# Patient Record
Sex: Male | Born: 1935 | Race: White | Hispanic: No | Marital: Married | Smoking: Former smoker
Health system: Southern US, Community
[De-identification: ages and names within clinical notes are randomized; demographics above are authoritative.]

## PROBLEM LIST (undated history)

## (undated) DIAGNOSIS — I639 Cerebral infarction, unspecified: Secondary | ICD-10-CM

## (undated) DIAGNOSIS — Z79899 Other long term (current) drug therapy: Secondary | ICD-10-CM

## (undated) DIAGNOSIS — IMO0001 Reserved for inherently not codable concepts without codable children: Secondary | ICD-10-CM

## (undated) DIAGNOSIS — E785 Hyperlipidemia, unspecified: Secondary | ICD-10-CM

## (undated) DIAGNOSIS — N189 Chronic kidney disease, unspecified: Secondary | ICD-10-CM

## (undated) DIAGNOSIS — E669 Obesity, unspecified: Secondary | ICD-10-CM

## (undated) DIAGNOSIS — I498 Other specified cardiac arrhythmias: Secondary | ICD-10-CM

## (undated) DIAGNOSIS — I2510 Atherosclerotic heart disease of native coronary artery without angina pectoris: Secondary | ICD-10-CM

## (undated) DIAGNOSIS — Z950 Presence of cardiac pacemaker: Secondary | ICD-10-CM

## (undated) DIAGNOSIS — I480 Paroxysmal atrial fibrillation: Secondary | ICD-10-CM

## (undated) DIAGNOSIS — K219 Gastro-esophageal reflux disease without esophagitis: Secondary | ICD-10-CM

## (undated) DIAGNOSIS — I10 Essential (primary) hypertension: Secondary | ICD-10-CM

## (undated) DIAGNOSIS — E1165 Type 2 diabetes mellitus with hyperglycemia: Secondary | ICD-10-CM

## (undated) HISTORY — DX: Presence of cardiac pacemaker: Z95.0

## (undated) HISTORY — DX: Type 2 diabetes mellitus with hyperglycemia: E11.65

## (undated) HISTORY — DX: Atherosclerotic heart disease of native coronary artery without angina pectoris: I25.10

## (undated) HISTORY — DX: Other specified cardiac arrhythmias: I49.8

## (undated) HISTORY — DX: Hyperlipidemia, unspecified: E78.5

## (undated) HISTORY — DX: Cerebral infarction, unspecified: I63.9

## (undated) HISTORY — DX: Gastro-esophageal reflux disease without esophagitis: K21.9

## (undated) HISTORY — DX: Essential (primary) hypertension: I10

## (undated) HISTORY — DX: Other long term (current) drug therapy: Z79.899

## (undated) HISTORY — DX: Chronic kidney disease, unspecified: N18.9

## (undated) HISTORY — DX: Obesity, unspecified: E66.9

## (undated) HISTORY — DX: Paroxysmal atrial fibrillation: I48.0

## (undated) HISTORY — DX: Reserved for inherently not codable concepts without codable children: IMO0001

---

## 1999-09-24 ENCOUNTER — Ambulatory Visit (HOSPITAL_COMMUNITY): Admission: RE | Admit: 1999-09-24 | Discharge: 1999-09-24 | Payer: Self-pay | Admitting: Gastroenterology

## 1999-09-24 ENCOUNTER — Encounter (INDEPENDENT_AMBULATORY_CARE_PROVIDER_SITE_OTHER): Payer: Self-pay

## 2002-06-30 ENCOUNTER — Inpatient Hospital Stay (HOSPITAL_COMMUNITY): Admission: EM | Admit: 2002-06-30 | Discharge: 2002-07-01 | Payer: Self-pay

## 2002-07-01 ENCOUNTER — Encounter: Payer: Self-pay | Admitting: Cardiology

## 2003-06-10 ENCOUNTER — Encounter (INDEPENDENT_AMBULATORY_CARE_PROVIDER_SITE_OTHER): Payer: Self-pay

## 2003-06-10 ENCOUNTER — Ambulatory Visit (HOSPITAL_COMMUNITY): Admission: RE | Admit: 2003-06-10 | Discharge: 2003-06-10 | Payer: Self-pay | Admitting: Gastroenterology

## 2003-10-28 ENCOUNTER — Inpatient Hospital Stay (HOSPITAL_COMMUNITY): Admission: EM | Admit: 2003-10-28 | Discharge: 2003-10-31 | Payer: Self-pay | Admitting: Emergency Medicine

## 2003-11-25 ENCOUNTER — Encounter: Admission: RE | Admit: 2003-11-25 | Discharge: 2004-02-23 | Payer: Self-pay | Admitting: *Deleted

## 2008-10-16 ENCOUNTER — Inpatient Hospital Stay (HOSPITAL_COMMUNITY): Admission: EM | Admit: 2008-10-16 | Discharge: 2008-10-24 | Payer: Self-pay | Admitting: Cardiology

## 2008-10-17 ENCOUNTER — Encounter: Payer: Self-pay | Admitting: Cardiology

## 2009-05-04 ENCOUNTER — Inpatient Hospital Stay (HOSPITAL_COMMUNITY): Admission: AD | Admit: 2009-05-04 | Discharge: 2009-05-07 | Payer: Self-pay | Admitting: Cardiology

## 2009-06-17 ENCOUNTER — Encounter: Admission: RE | Admit: 2009-06-17 | Discharge: 2009-06-17 | Payer: Self-pay | Admitting: Nephrology

## 2010-01-03 HISTORY — PX: PACEMAKER INSERTION: SHX728

## 2010-01-13 ENCOUNTER — Ambulatory Visit: Payer: Self-pay | Admitting: Vascular Surgery

## 2010-02-02 ENCOUNTER — Observation Stay (HOSPITAL_COMMUNITY): Admission: RE | Admit: 2010-02-02 | Discharge: 2010-02-03 | Payer: Self-pay | Admitting: Cardiology

## 2010-03-22 ENCOUNTER — Encounter: Payer: Self-pay | Admitting: Internal Medicine

## 2010-04-12 ENCOUNTER — Ambulatory Visit: Payer: Self-pay | Admitting: Cardiology

## 2010-04-14 ENCOUNTER — Ambulatory Visit: Payer: Self-pay | Admitting: Cardiology

## 2010-04-20 ENCOUNTER — Ambulatory Visit (HOSPITAL_COMMUNITY): Admission: RE | Admit: 2010-04-20 | Discharge: 2010-04-20 | Payer: Self-pay | Admitting: Cardiology

## 2010-04-20 ENCOUNTER — Ambulatory Visit: Payer: Self-pay | Admitting: Cardiology

## 2010-04-27 ENCOUNTER — Ambulatory Visit: Payer: Self-pay | Admitting: Cardiology

## 2010-05-18 ENCOUNTER — Ambulatory Visit: Payer: Self-pay | Admitting: Cardiology

## 2010-07-03 ENCOUNTER — Encounter: Payer: Self-pay | Admitting: Internal Medicine

## 2010-07-19 ENCOUNTER — Ambulatory Visit: Payer: Self-pay | Admitting: Cardiology

## 2010-08-04 ENCOUNTER — Ambulatory Visit: Payer: Self-pay | Admitting: Internal Medicine

## 2010-08-04 DIAGNOSIS — I498 Other specified cardiac arrhythmias: Secondary | ICD-10-CM

## 2010-08-04 DIAGNOSIS — I2510 Atherosclerotic heart disease of native coronary artery without angina pectoris: Secondary | ICD-10-CM | POA: Insufficient documentation

## 2010-08-04 DIAGNOSIS — I495 Sick sinus syndrome: Secondary | ICD-10-CM | POA: Insufficient documentation

## 2010-08-04 DIAGNOSIS — I4891 Unspecified atrial fibrillation: Secondary | ICD-10-CM | POA: Insufficient documentation

## 2010-08-04 DIAGNOSIS — I10 Essential (primary) hypertension: Secondary | ICD-10-CM | POA: Insufficient documentation

## 2010-08-04 HISTORY — DX: Other specified cardiac arrhythmias: I49.8

## 2010-09-26 ENCOUNTER — Encounter: Payer: Self-pay | Admitting: Urology

## 2010-10-05 NOTE — Cardiovascular Report (Signed)
Summary: Office Visit   Office Visit   Imported By: Roderic Ovens 08/10/2010 10:41:56  _____________________________________________________________________  External Attachment:    Type:   Image     Comment:   External Document

## 2010-10-05 NOTE — Miscellaneous (Signed)
Summary: Device preload  Clinical Lists Changes  Observations: Added new observation of PPM INDICATN: A-fib (07/03/2010 10:52) Added new observation of MAGNET RTE: BOL 85 ERI 65 (07/03/2010 10:52) Added new observation of PPMLEADSTAT2: active (07/03/2010 10:52) Added new observation of PPMLEADSER2: 161096  (07/03/2010 10:52) Added new observation of PPMLEADMOD2: 4470  (07/03/2010 10:52) Added new observation of PPMLEADDOI2: 02/02/2010  (07/03/2010 10:52) Added new observation of PPMLEADLOC2: RV  (07/03/2010 10:52) Added new observation of PPMLEADSTAT1: active  (07/03/2010 10:52) Added new observation of PPMLEADSER1: 045409  (07/03/2010 10:52) Added new observation of PPMLEADMOD1: 4469  (07/03/2010 10:52) Added new observation of PPMLEADDOI1: 02/02/2010  (07/03/2010 10:52) Added new observation of PPMLEADLOC1: RA  (07/03/2010 10:52) Added new observation of PPM DOI: 02/02/2010  (07/03/2010 10:52) Added new observation of PPM SERL#: [id number]  (07/03/2010 10:52) Added new observation of PPM MODL#: [id number]  (07/03/2010 10:52) Added new observation of PACEMAKERMFG: Medtronic  (07/03/2010 10:52) Added new observation of PPM IMP MD: Duffy Rhody Tennant,MD  (07/03/2010 10:52) Added new observation of PPM REFER MD: Rolla Plate  (07/03/2010 10:52) Added new observation of PACEMAKER MD: [name], MD  (07/03/2010 10:52)      PPM Specifications Following MD:  [name], MD     Referring MD:  Rolla Plate PPM Vendor:  Medtronic     PPM Model Number:  [id number]     PPM Serial Number:  [id number] PPM DOI:  02/02/2010     PPM Implanting MD:  Rolla Plate  Lead 1    Location: RA     DOI: 02/02/2010     Model #: [id number]     Serial #: [id number]     Status: active Lead 2    Location: RV     DOI: 02/02/2010     Model #: 4470     Serial #: [id number]     Status: active  Magnet Response Rate:  BOL 85 ERI 65  Indications:  A-fib

## 2010-10-05 NOTE — Letter (Signed)
Summary: [facility]  [facility]   Imported By: Marylou Mccoy 08/13/2010 17:30:14  _____________________________________________________________________  External Attachment:    Type:   Image     Comment:   External Document

## 2010-10-05 NOTE — Assessment & Plan Note (Signed)
Summary: pacer check/medtronic   Visit Type:  Initial Consult Referring Provider:  Dr Deborah Chalk   History of Present Illness: Mr Kent Nolan is a pleasant 77 yo WM with a h/o persistent atrial fibrillation with slow ventricular response and symptomatic second degree AV block s/p PPM implantation (MDT) by Dr Deborah Chalk 02/02/10.  He reports doing well since that time.  He has had no significant dizziness.  He feels that his energy is stable.  He underwent cardioversion 8/11 and has done well since that time.  The patient denies symptoms of palpitations, chest pain, shortness of breath, orthopnea, PND, lower extremity edema, dizziness, presyncope, syncope, or neurologic sequela. The patient is tolerating medications without difficulties and is otherwise without complaint today.   Current Medications (verified): 1)  Plavix 75 Mg Tabs (Clopidogrel Bisulfate) .... Take One Tablet By Mouth Daily 2)  Amiodarone Hcl 200 Mg Tabs (Amiodarone Hcl) .... Take One Tablet By Mouth Once Daily 3)  Furosemide 40 Mg Tabs (Furosemide) .... Take One Tablet By Mouth Daily. 4)  Crestor 20 Mg Tabs (Rosuvastatin Calcium) .... Take One Tablet By Mouth Daily. 5)  Tricor 145 Mg Tabs (Fenofibrate) .... Once Daily 6)  Famotidine 40 Mg Tabs (Famotidine) .... Once Daily 7)  Nitrostat 0.4 Mg Subl (Nitroglycerin) .Marland Kitchen.. 1 Tablet Under Tongue At Onset of Chest Pain; You May Repeat Every 5 Minutes For Up To 3 Doses. 8)  Multivitamins  Tabs (Multiple Vitamin) .... Once Daily  Allergies (verified): 1)  ! Penicillin 2)  ! Sulfa  Past History:  Past Medical History: Second degree AV block s/p PPM (MDT) Persistent atrial fibrillation with slow ventricular response HL CAD with previous subendocardial MI in  February 2005 with stent placement to the right coronary artery and       left circumflex.  He experienced acute thrombosis of both stents in February 2010 associated with cardiogenic shock.  He had repeat stenting to both lesions with  non-drug-eluting stents placed.   Followup catheterization in September 2010 was satisfactory.  He continues with medical management and is on chronic Plavix.  Diabetes.  Hypertension.  GERD.  Chronic renal insufficiency.   Past Surgical History: s/p PPM (MDT) by Dr Deborah Chalk  Family History: unaware of significant FH  Social History: Lives in [place], denies TED  Review of Systems       All systems are reviewed and negative except as listed in the HPI.   Vital Signs:  Patient profile:   77 year old male Height:      68 inches Weight:      219 pounds BMI:     33.42 Pulse rate:   60 / minute BP sitting:   136 / 83  (right arm)  Vitals Entered By: Laurance Flatten CMA (August 04, 2010 12:15 PM)  Physical Exam  General:  Well developed, well nourished, in no acute distress. Head:  normocephalic and atraumatic Eyes:  PERRLA/EOM intact; conjunctiva and lids normal. Mouth:  Teeth, gums and palate normal. Oral mucosa normal. Neck:  supple Chest Wall:  R sided pacemaker pocket is well healed Lungs:  Clear bilaterally to auscultation and percussion. Heart:  RRR, no m/r/g Abdomen:  Bowel sounds positive; abdomen soft and non-tender without masses, organomegaly, or hernias noted. No hepatosplenomegaly. Msk:  Back normal, normal gait. Muscle strength and tone normal. Pulses:  pulses normal in all 4 extremities Extremities:  No clubbing or cyanosis. Neurologic:  Alert and oriented x 3.   PPM Specifications Following MD:  [name], MD  Referring MD:  Rolla Plate PPM Vendor:  [employer]tronic     PPM Model Number:  [id number]     PPM Serial Number:  [id number] H PPM DOI:  02/02/2010     PPM Implanting MD:  Duffy Rhody Tennant,MD  Lead 1    Location: RA     DOI: 02/02/2010     Model #: 4469     Serial #: [id number]     Status: active Lead 2    Location: RV     DOI: 02/02/2010     Model #: 4470     Serial #: [id number]     Status: active  Magnet Response Rate:  BOL 85 ERI  65  Indications:  A-fib   PPM Follow Up Battery Voltage:  2.99 V       PPM Device Measurements Atrium  Amplitude: 2 mV, Impedance: 408 ohms,  Right Ventricle  Impedance: 360 ohms,   Episodes MS Episodes:  26     Percent Mode Switch:  0.7%     Ventricular High Rate:  0     Atrial Pacing:  78.8%     Ventricular Pacing:  64.3%  Parameters Mode:  DDD     Lower Rate Limit:  50     Upper Rate Limit:  130 Paced AV Delay:  300     Sensed AV Delay:  270 Tech Comments:  [facility]---26 AF EPISODES---LONGEST WAS 4 HRS.  0.7% OF TIME.  RA THRESHOLD 6.0 @ 1.75ms OR 8 @ 0.64ms.  SET AT 6.0 @ 1.80ms.  CHANGED LRL FROM 60 TO 50 TO DECREASE AMOUNT OF AP. PLAN PER JA.  PT TO BE ENROLLED IN [facility] AND FIRST TRANSMISSION 11-04-10. Vella Kohler  August 04, 2010 12:21 PM MD Comments:  agree  Impression & Recommendations:  Problem # 1:  BRADYCARDIA (ICD-427.89) Assessment Unchanged doing well s/p PPM the patient's RA lead threshold is elevated and does not consistently capture at max output.  I discussed lead revision with the patient today.  He is clear that he does not wish to have his lead revised at this time. I have therefore reduced his lower pacing rate to 50 bpm to minimize attempts to pace the atrium.  He will follow-up with Dr Maylon Cos for further discussion.  We may be able to avoid lead revision, depending on how he is doing on follow-up.  Problem # 2:  ATRIAL FIBRILLATION (ICD-427.31) maintaining sinus rhythm with amiodarone His CHADS2 score is at least three (age, HTN, and DM).  He should therefore be chronically anticoaguated with coumadin, pradaxa, or Xarelto.  He does not recall why he has been taken off of coumadin.  I have recommended that he follow-up with Dr Deborah Chalk to discuss this further.  Problem # 3:  ESSENTIAL HYPERTENSION, BENIGN (ICD-401.1) stable no changes  Problem # 4:  CAD (ICD-414.00) no ischemic symptoms he will require lifelong plavix  Patient Instructions: 1)   Your physician recommends that you schedule a follow-up appointment in: 3 months. 2)  Your physician recommends that you continue on your current medications as directed. Please refer to the Current Medication list given to you today. 3)  He should follow-up with Dr Deborah Chalk in the interim.

## 2010-11-08 ENCOUNTER — Encounter: Payer: Self-pay | Admitting: Internal Medicine

## 2010-11-08 ENCOUNTER — Encounter (INDEPENDENT_AMBULATORY_CARE_PROVIDER_SITE_OTHER): Payer: Medicare Other | Admitting: Internal Medicine

## 2010-11-08 DIAGNOSIS — I4891 Unspecified atrial fibrillation: Secondary | ICD-10-CM

## 2010-11-08 DIAGNOSIS — I495 Sick sinus syndrome: Secondary | ICD-10-CM

## 2010-11-08 DIAGNOSIS — I10 Essential (primary) hypertension: Secondary | ICD-10-CM

## 2010-11-16 NOTE — Assessment & Plan Note (Signed)
Summary: FOLLOW UP/D. MILLER/KL   Visit Type:  Follow-up Referring Provider:  Dr Deborah Chalk   History of Present Illness: Mr Nevares is a pleasant 77 yo WM with a h/o persistent atrial fibrillation with slow ventricular response and symptomatic second degree AV block s/p PPM implantation (MDT) by Dr Deborah Chalk 02/02/10.  He reports doing well since that time.  The patient denies symptoms of palpitations, chest pain, shortness of breath, orthopnea, PND, lower extremity edema, dizziness, presyncope, syncope, or neurologic sequela. The patient is tolerating medications without difficulties and is otherwise without complaint today.   Current Medications (verified): 1)  Plavix 75 Mg Tabs (Clopidogrel Bisulfate) .... Take One Tablet By Mouth Daily 2)  Amiodarone Hcl 200 Mg Tabs (Amiodarone Hcl) .... Take One Tablet By Mouth Once Daily 3)  Furosemide 40 Mg Tabs (Furosemide) .... Take One Tablet By Mouth Daily. 4)  Crestor 20 Mg Tabs (Rosuvastatin Calcium) .... Take One Tablet By Mouth Daily. 5)  Tricor 145 Mg Tabs (Fenofibrate) .... Once Daily 6)  Famotidine 40 Mg Tabs (Famotidine) .... Once Daily 7)  Nitrostat 0.4 Mg Subl (Nitroglycerin) .Marland Kitchen.. 1 Tablet Under Tongue At Onset of Chest Pain; You May Repeat Every 5 Minutes For Up To 3 Doses. 8)  Multivitamins  Tabs (Multiple Vitamin) .... Once Daily 9)  Aspirin 81 Mg Tbec (Aspirin) .... Take One Tablet By Mouth Daily 10)  Fish Oil   Oil (Fish Oil) .Marland Kitchen.. 1 Capsule Two Times A Day  Allergies: 1)  ! Penicillin 2)  ! Sulfa  Past History:  Past Medical History: Reviewed history from 08/04/2010 and no changes required. Second degree AV block s/p PPM (MDT) Persistent atrial fibrillation with slow ventricular response HL CAD with previous subendocardial MI in  February 2005 with stent placement to the right coronary artery and       left circumflex.  He experienced acute thrombosis of both stents in February 2010 associated with cardiogenic shock.  He had  repeat stenting to both lesions with non-drug-eluting stents placed.   Followup catheterization in September 2010 was satisfactory.  He continues with medical management and is on chronic Plavix.  Diabetes.  Hypertension.  GERD.  Chronic renal insufficiency.   Past Surgical History: Reviewed history from 08/04/2010 and no changes required. s/p PPM (MDT) by Dr Deborah Chalk  Social History: Reviewed history from 08/04/2010 and no changes required. Lives in [place], denies TED  Review of Systems       All systems are reviewed and negative except as listed in the HPI.   Vital Signs:  Patient profile:   77 year old male Height:      68 inches Weight:      215 pounds BMI:     32.81 Pulse rate:   72 / minute BP sitting:   120 / 80  (left arm)  Vitals Entered By: Laurance Flatten CMA (November 08, 2010 11:17 AM)  Physical Exam  General:  Well developed, well nourished, in no acute distress. Head:  normocephalic and atraumatic Eyes:  PERRLA/EOM intact; conjunctiva and lids normal. Mouth:  Teeth, gums and palate normal. Oral mucosa normal. Neck:  supple Chest Wall:  R sided pacemaker pocket is well healed Lungs:  Clear bilaterally to auscultation and percussion. Heart:  RRR, no m/r/g Abdomen:  Bowel sounds positive; abdomen soft and non-tender without masses, organomegaly, or hernias noted. No hepatosplenomegaly. Msk:  Back normal, normal gait. Muscle strength and tone normal. Extremities:  No clubbing or cyanosis. Neurologic:  Alert and oriented  x 3.   PPM Specifications Following MD:  [name], MD     Referring MD:  Rolla Plate PPM Vendor:  [employer]     PPM Model Number:  [id number]     PPM Serial Number:  [id number] PPM DOI:  02/02/2010     PPM Implanting MD:  Rolla Plate  Lead 1    Location: RA     DOI: 02/02/2010     Model #: 4469     Serial #: [id number]     Status: active Lead 2    Location: RV     DOI: 02/02/2010     Model #: 4470     Serial #: [id number]     Status:  active  Magnet Response Rate:  BOL 85 ERI 65  Indications:  A-fib   Parameters Mode:  DDD     Lower Rate Limit:  50     Upper Rate Limit:  130 Paced AV Delay:  300     Sensed AV Delay:  270 MD Comments:  see scanned report from [facility]  Impression & Recommendations:  Problem # 1:  BRADYCARDIA (ICD-427.89) doing well s/p PPM the patient's RA lead threshold remains elevated.  I discussed lead revision with the patient today.  He is clear that he does not wish to have his lead revised at this time. I previously reduced his lower pacing rate to 50 bpm to minimize attempts to pace the atrium.  Problem # 2:  ATRIAL FIBRILLATION (ICD-427.31)  maintaining sinus rhythm with amiodarone His CHADS2 score is at least three (age, HTN, and DM).  He should therefore be chronically anticoaguated with coumadin, pradaxa, or Xarelto.  He does not recall why he has been taken off of coumadin.  I have recommended that he follow-up with Dr Deborah Chalk to discuss this further.  Problem # 3:  ESSENTIAL HYPERTENSION, BENIGN (ICD-401.1)  His updated medication list for this problem includes:    Furosemide 40 Mg Tabs (Furosemide) .Marland Kitchen... Take one tablet by mouth daily.    Aspirin 81 Mg Tbec (Aspirin) .Marland Kitchen... Take one tablet by mouth daily  stable no changes  Problem # 4:  CAD (ICD-414.00) no ischemic symptoms he will require lifelong plavix  Patient Instructions: 1)  Your physician recommends that you continue on your current medications as directed. Please refer to the Current Medication list given to you today. 2)  Your physician wants you to follow-up in 6 months with with r llred:   You will receive a reminder letter in the mail two months in advance. If you don't receive a letter, please call our office to schedule the follow-up appointment.  3)  [facility] 02/10/2011

## 2010-11-17 ENCOUNTER — Ambulatory Visit (INDEPENDENT_AMBULATORY_CARE_PROVIDER_SITE_OTHER): Payer: Medicare Other | Admitting: Cardiology

## 2010-11-17 DIAGNOSIS — I2510 Atherosclerotic heart disease of native coronary artery without angina pectoris: Secondary | ICD-10-CM

## 2010-11-17 DIAGNOSIS — Z950 Presence of cardiac pacemaker: Secondary | ICD-10-CM

## 2010-11-17 DIAGNOSIS — I4891 Unspecified atrial fibrillation: Secondary | ICD-10-CM

## 2010-11-18 ENCOUNTER — Other Ambulatory Visit (INDEPENDENT_AMBULATORY_CARE_PROVIDER_SITE_OTHER): Payer: Medicare Other

## 2010-11-18 DIAGNOSIS — E7800 Pure hypercholesterolemia, unspecified: Secondary | ICD-10-CM

## 2010-11-18 DIAGNOSIS — Z79899 Other long term (current) drug therapy: Secondary | ICD-10-CM

## 2010-11-18 LAB — GLUCOSE, CAPILLARY: Glucose-Capillary: 163 mg/dL — ABNORMAL HIGH (ref 70–99)

## 2010-11-18 LAB — APTT: aPTT: 32 seconds (ref 24–37)

## 2010-11-22 LAB — SURGICAL PCR SCREEN: Staphylococcus aureus: POSITIVE — AB

## 2010-11-22 LAB — GLUCOSE, CAPILLARY: Glucose-Capillary: 129 mg/dL — ABNORMAL HIGH (ref 70–99)

## 2010-11-23 NOTE — Cardiovascular Report (Signed)
Summary: Office Visit   Office Visit   Imported By: Roderic Ovens 11/15/2010 14:26:49  _____________________________________________________________________  External Attachment:    Type:   Image     Comment:   External Document

## 2010-12-10 LAB — CBC
HCT: 41 % (ref 39.0–52.0)
MCHC: 33.8 g/dL (ref 30.0–36.0)
MCHC: 33.9 g/dL (ref 30.0–36.0)
MCV: 97.2 fL (ref 78.0–100.0)
MCV: 97.5 fL (ref 78.0–100.0)
Platelets: 116 10*3/uL — ABNORMAL LOW (ref 150–400)
Platelets: 124 10*3/uL — ABNORMAL LOW (ref 150–400)
RDW: 13.3 % (ref 11.5–15.5)
WBC: 5.4 10*3/uL (ref 4.0–10.5)

## 2010-12-10 LAB — BASIC METABOLIC PANEL
BUN: 16 mg/dL (ref 6–23)
BUN: 20 mg/dL (ref 6–23)
CO2: 27 mEq/L (ref 19–32)
Chloride: 105 mEq/L (ref 96–112)
Creatinine, Ser: 1.75 mg/dL — ABNORMAL HIGH (ref 0.4–1.5)
Creatinine, Ser: 1.84 mg/dL — ABNORMAL HIGH (ref 0.4–1.5)
GFR calc non Af Amer: 38 mL/min — ABNORMAL LOW (ref >60)

## 2010-12-10 LAB — HEPARIN LEVEL (UNFRACTIONATED): Heparin Unfractionated: 0.5 IU/mL (ref 0.30–0.70)

## 2010-12-11 LAB — LIPID PANEL
HDL: 39 mg/dL — ABNORMAL LOW (ref >39)
Triglycerides: 51 mg/dL (ref <150)
VLDL: 10 mg/dL (ref 0–40)

## 2010-12-11 LAB — CARDIAC PANEL(CRET KIN+CKTOT+MB+TROPI)
CK, MB: 1.9 ng/mL (ref 0.3–4.0)
CK, MB: 2.6 ng/mL (ref 0.3–4.0)
Relative Index: INVALID (ref 0.0–2.5)
Total CK: 83 U/L (ref 7–232)
Troponin I: 0.03 ng/mL (ref 0.00–0.06)
Troponin I: 0.03 ng/mL (ref 0.00–0.06)

## 2010-12-11 LAB — GLUCOSE, CAPILLARY
Glucose-Capillary: 103 mg/dL — ABNORMAL HIGH (ref 70–99)
Glucose-Capillary: 119 mg/dL — ABNORMAL HIGH (ref 70–99)
Glucose-Capillary: 119 mg/dL — ABNORMAL HIGH (ref 70–99)
Glucose-Capillary: 122 mg/dL — ABNORMAL HIGH (ref 70–99)
Glucose-Capillary: 144 mg/dL — ABNORMAL HIGH (ref 70–99)

## 2010-12-11 LAB — BASIC METABOLIC PANEL
Calcium: 9.1 mg/dL (ref 8.4–10.5)
GFR calc non Af Amer: 28 mL/min — ABNORMAL LOW (ref >60)
Glucose, Bld: 136 mg/dL — ABNORMAL HIGH (ref 70–99)
Potassium: 4.4 mEq/L (ref 3.5–5.1)
Sodium: 134 mEq/L — ABNORMAL LOW (ref 135–145)

## 2010-12-11 LAB — CBC
MCV: 96.7 fL (ref 78.0–100.0)
MCV: 97.1 fL (ref 78.0–100.0)
Platelets: 130 10*3/uL — ABNORMAL LOW (ref 150–400)
Platelets: 137 10*3/uL — ABNORMAL LOW (ref 150–400)
RBC: 4.33 MIL/uL (ref 4.22–5.81)
WBC: 6.1 10*3/uL (ref 4.0–10.5)
WBC: 7.4 10*3/uL (ref 4.0–10.5)

## 2010-12-11 LAB — PROTIME-INR
INR: 1 (ref 0.00–1.49)
Prothrombin Time: 13.5 seconds (ref 11.6–15.2)

## 2010-12-11 LAB — HEPARIN LEVEL (UNFRACTIONATED)
Heparin Unfractionated: 0.6 IU/mL (ref 0.30–0.70)
Heparin Unfractionated: 0.69 IU/mL (ref 0.30–0.70)

## 2010-12-11 LAB — DIFFERENTIAL
Eosinophils Absolute: 0.1 10*3/uL (ref 0.0–0.7)
Lymphocytes Relative: 15 % (ref 12–46)
Lymphs Abs: 0.9 10*3/uL (ref 0.7–4.0)
Neutrophils Relative %: 74 % (ref 43–77)

## 2010-12-11 LAB — TSH: TSH: 2.397 u[IU]/mL (ref 0.350–4.500)

## 2010-12-21 LAB — BASIC METABOLIC PANEL
BUN: 24 mg/dL — ABNORMAL HIGH (ref 6–23)
BUN: 28 mg/dL — ABNORMAL HIGH (ref 6–23)
BUN: 24 mg/dL — ABNORMAL HIGH (ref 6–23)
CO2: 18 mEq/L — ABNORMAL LOW (ref 19–32)
CO2: 19 mEq/L (ref 19–32)
CO2: 19 mEq/L (ref 19–32)
CO2: 20 mEq/L (ref 19–32)
CO2: 22 mEq/L (ref 19–32)
CO2: 23 mEq/L (ref 19–32)
Calcium: 7.5 mg/dL — ABNORMAL LOW (ref 8.4–10.5)
Calcium: 7.9 mg/dL — ABNORMAL LOW (ref 8.4–10.5)
Calcium: 8.8 mg/dL (ref 8.4–10.5)
Chloride: 102 mEq/L (ref 96–112)
Chloride: 103 mEq/L (ref 96–112)
Chloride: 104 mEq/L (ref 96–112)
Chloride: 106 mEq/L (ref 96–112)
Chloride: 106 mEq/L (ref 96–112)
Chloride: 105 mEq/L (ref 96–112)
Creatinine, Ser: 1.45 mg/dL (ref 0.4–1.5)
Creatinine, Ser: 1.79 mg/dL — ABNORMAL HIGH (ref 0.4–1.5)
Creatinine, Ser: 2.39 mg/dL — ABNORMAL HIGH (ref 0.4–1.5)
Creatinine, Ser: 2.59 mg/dL — ABNORMAL HIGH (ref 0.4–1.5)
Creatinine, Ser: 2.65 mg/dL — ABNORMAL HIGH (ref 0.4–1.5)
GFR calc Af Amer: 28 mL/min — ABNORMAL LOW (ref >60)
GFR calc Af Amer: 32 mL/min — ABNORMAL LOW (ref >60)
GFR calc Af Amer: 57 mL/min — ABNORMAL LOW (ref >60)
GFR calc Af Amer: >60 mL/min (ref >60)
GFR calc non Af Amer: 47 mL/min — ABNORMAL LOW (ref >60)
Glucose, Bld: 179 mg/dL — ABNORMAL HIGH (ref 70–99)
Glucose, Bld: 208 mg/dL — ABNORMAL HIGH (ref 70–99)
Glucose, Bld: 208 mg/dL — ABNORMAL HIGH (ref 70–99)
Glucose, Bld: 224 mg/dL — ABNORMAL HIGH (ref 70–99)
Glucose, Bld: 254 mg/dL — ABNORMAL HIGH (ref 70–99)
Glucose, Bld: 267 mg/dL — ABNORMAL HIGH (ref 70–99)
Potassium: 3.9 mEq/L (ref 3.5–5.1)
Potassium: 4.3 mEq/L (ref 3.5–5.1)
Potassium: 3.7 mEq/L (ref 3.5–5.1)
Sodium: 131 mEq/L — ABNORMAL LOW (ref 135–145)
Sodium: 136 mEq/L (ref 135–145)

## 2010-12-21 LAB — GLUCOSE, CAPILLARY
Glucose-Capillary: 131 mg/dL — ABNORMAL HIGH (ref 70–99)
Glucose-Capillary: 144 mg/dL — ABNORMAL HIGH (ref 70–99)
Glucose-Capillary: 147 mg/dL — ABNORMAL HIGH (ref 70–99)
Glucose-Capillary: 165 mg/dL — ABNORMAL HIGH (ref 70–99)
Glucose-Capillary: 204 mg/dL — ABNORMAL HIGH (ref 70–99)
Glucose-Capillary: 218 mg/dL — ABNORMAL HIGH (ref 70–99)
Glucose-Capillary: 235 mg/dL — ABNORMAL HIGH (ref 70–99)
Glucose-Capillary: 247 mg/dL — ABNORMAL HIGH (ref 70–99)
Glucose-Capillary: 129 mg/dL — ABNORMAL HIGH (ref 70–99)
Glucose-Capillary: 135 mg/dL — ABNORMAL HIGH (ref 70–99)
Glucose-Capillary: 139 mg/dL — ABNORMAL HIGH (ref 70–99)
Glucose-Capillary: 155 mg/dL — ABNORMAL HIGH (ref 70–99)
Glucose-Capillary: 169 mg/dL — ABNORMAL HIGH (ref 70–99)
Glucose-Capillary: 169 mg/dL — ABNORMAL HIGH (ref 70–99)
Glucose-Capillary: 186 mg/dL — ABNORMAL HIGH (ref 70–99)

## 2010-12-21 LAB — POCT I-STAT, CHEM 8
BUN: 21 mg/dL (ref 6–23)
Calcium, Ion: 0.8 mmol/L — ABNORMAL LOW (ref 1.12–1.32)
Creatinine, Ser: 2.1 mg/dL — ABNORMAL HIGH (ref 0.4–1.5)
Glucose, Bld: 235 mg/dL — ABNORMAL HIGH (ref 70–99)
Hemoglobin: 12.9 g/dL — ABNORMAL LOW (ref 13.0–17.0)
TCO2: 13 mmol/L (ref 0–100)

## 2010-12-21 LAB — CBC
HCT: 28.6 % — ABNORMAL LOW (ref 39.0–52.0)
HCT: 35.1 % — ABNORMAL LOW (ref 39.0–52.0)
HCT: 42 % (ref 39.0–52.0)
HCT: 25.4 % — ABNORMAL LOW (ref 39.0–52.0)
HCT: 27 % — ABNORMAL LOW (ref 39.0–52.0)
Hemoglobin: 10.3 g/dL — ABNORMAL LOW (ref 13.0–17.0)
Hemoglobin: 12.4 g/dL — ABNORMAL LOW (ref 13.0–17.0)
Hemoglobin: 14.6 g/dL (ref 13.0–17.0)
Hemoglobin: 9.2 g/dL — ABNORMAL LOW (ref 13.0–17.0)
Hemoglobin: 9.5 g/dL — ABNORMAL LOW (ref 13.0–17.0)
MCHC: 35.4 g/dL (ref 30.0–36.0)
MCHC: 35.6 g/dL (ref 30.0–36.0)
MCHC: 36 g/dL (ref 30.0–36.0)
MCV: 94.6 fL (ref 78.0–100.0)
MCV: 95.4 fL (ref 78.0–100.0)
MCV: 95.5 fL (ref 78.0–100.0)
MCV: 95.3 fL (ref 78.0–100.0)
Platelets: 125 10*3/uL — ABNORMAL LOW (ref 150–400)
RBC: 3.06 MIL/uL — ABNORMAL LOW (ref 4.22–5.81)
RBC: 3.68 MIL/uL — ABNORMAL LOW (ref 4.22–5.81)
RBC: 4.4 MIL/uL (ref 4.22–5.81)
RBC: 4.41 MIL/uL (ref 4.22–5.81)
RBC: 2.83 MIL/uL — ABNORMAL LOW (ref 4.22–5.81)
RDW: 12.7 % (ref 11.5–15.5)
RDW: 12.7 % (ref 11.5–15.5)
RDW: 12.8 % (ref 11.5–15.5)
WBC: 20.1 10*3/uL — ABNORMAL HIGH (ref 4.0–10.5)
WBC: 20.1 10*3/uL — ABNORMAL HIGH (ref 4.0–10.5)
WBC: 8 10*3/uL (ref 4.0–10.5)
WBC: 8.6 10*3/uL (ref 4.0–10.5)

## 2010-12-21 LAB — BRAIN NATRIURETIC PEPTIDE: Pro B Natriuretic peptide (BNP): 329 pg/mL — ABNORMAL HIGH (ref 0.0–100.0)

## 2010-12-21 LAB — IRON AND TIBC
Iron: 25 ug/dL — ABNORMAL LOW (ref 42–135)
Saturation Ratios: 11 % — ABNORMAL LOW (ref 20–55)
TIBC: 232 ug/dL (ref 215–435)
UIBC: 207 ug/dL

## 2010-12-21 LAB — CARDIAC PANEL(CRET KIN+CKTOT+MB+TROPI)
CK, MB: 280.1 ng/mL — ABNORMAL HIGH (ref 0.3–4.0)
CK, MB: 533.1 ng/mL — ABNORMAL HIGH (ref 0.3–4.0)
Total CK: 4628 U/L — ABNORMAL HIGH (ref 7–232)
Total CK: 7456 U/L — ABNORMAL HIGH (ref 7–232)
Total CK: 7778 U/L — ABNORMAL HIGH (ref 7–232)

## 2010-12-21 LAB — FERRITIN: Ferritin: 534 ng/mL — ABNORMAL HIGH (ref 22–322)

## 2010-12-21 LAB — HEPARIN LEVEL (UNFRACTIONATED): Heparin Unfractionated: 0.36 IU/mL (ref 0.30–0.70)

## 2010-12-21 LAB — VITAMIN B12: Vitamin B-12: 709 pg/mL (ref 211–911)

## 2011-01-18 NOTE — Procedures (Signed)
RENAL ARTERY DUPLEX EVALUATION   INDICATION:  Hypertension.   HISTORY:  Diabetes:  Borderline.  Cardiac:  MI and stent x2.  Hypertension:  Yes.  Smoking:  Previous.  He quit 45 years ago.   RENAL ARTERY DUPLEX FINDINGS:  Aorta-Proximal:  34 cm/s  Aorta-Mid:  48 cm/s  Aorta-Distal:  47 cm/s  Celiac Artery Origin:  N/V  SMA Origin:  N/V                                    RIGHT               LEFT  Renal Artery Origin:             N/V                 N/V  Renal Artery Proximal:           N/V                 63/14  Renal Artery Mid:                N/V                 66/14 cm/s  Renal Artery Distal:             N/V                 44/12 cm/s  Hilar Acceleration Time (AT):    30/12 m/s2          31/10 m/s2  Renal-Aortic Ratio (RAR):        N/V                 1.37  Kidney Size:                     10.9 cm             10.5 cm  End Diastolic Ratio (EDR):  Resistive Index (RI):            0.60                0.67   IMPRESSION:  1. Unable to adequately visualize the bilateral renal artery origins      and the proximal and mid right renal artery due to excessive bowel      gas and patient's body habitus.  2. The bilateral intrarenal indices and the bilateral kidney length      measurements are within normal limits.  3. No elevated velocities were noted in the visualized bilateral renal      arteries.   ___________________________________________  Janetta Hora Fields, MD   NT/MEDQ  D:  01/13/2010  T:  01/13/2010  Job:  [id number]   cc:   Zetta Bills, MD

## 2011-01-18 NOTE — Cardiovascular Report (Signed)
NAME:  Kent Nolan, Kent Nolan NO.:  [id number]   MEDICAL RECORD NO.:  [id number]          PATIENT TYPE:  INP   LOCATION:  3708                         FACILITY:  [facility]   PHYSICIAN:  Colleen Can. Deborah Chalk, M.D.DATE OF BIRTH:  1929/08/02   DATE OF PROCEDURE:  05/06/2009  DATE OF DISCHARGE:                            CARDIAC CATHETERIZATION   PROCEDURE:  Coronary arteriograms with left ventricular catheterization,  no left ventricular angiogram.   TYPE AND SITE OF ENTRY:  Percutaneous right femoral artery.   CATHETERS:  A 6-French 4-curved Judkins right-to-left coronary catheter,  6-French pigtail ventriculographic catheter.   CONTRAST MATERIAL:  Omnipaque.   MEDICATIONS GIVEN PRIOR TO THE PROCEDURE:  Valium 10 mg.   MEDICATIONS GIVEN DURING THE PROCEDURE:  Versed 2 mg IV.   COMMENTS:  The patient tolerated the procedure well.   HEMODYNAMIC DATA:  Aortic pressure was 131/65, LV was 130/7-20.  There  was no aortic valve gradient noted on pullback.   CORONARY ARTERIES:  Coronary arteries arise and distribute normally.  It  is a right-dominant system.  1. Right coronary artery.  Right coronary artery is a large dominant      vessel.  The stent is widely patent in the proximal section.  There      are minor irregularities of less than 30% scattered throughout the      vessel.  2. Left main coronary artery is normal.  3. Left anterior descending.  Left anterior descending has diffuse      irregularities with no greater than 30-40% in general.  There is a      50% narrowing in the small-to-moderate size diagonal in a diffuse      manner.  4. Left circumflex.  Left circumflex has a patent stent in the obtuse      marginal vessel.  There is approximately 50% narrowing in the mid      left circumflex, but overall lumen size appears to be quite      adequate and does not appear to be flow limiting.   OVERALL IMPRESSION:  1. Persistent patency of the stent in the left  circumflex and right      coronary artery proximally with minor irregularities, otherwise.  2. High normal left ventricular filling pressures.   DISCUSSION:  In light of these findings, it is felt that Mr. Manlove can  be managed medically.  It is felt that he does not need repeat  angioplasty.      Colleen Can. Deborah Chalk, M.D.  Electronically Signed     SNT/MEDQ  D:  05/06/2009  T:  05/07/2009  Job:  [id number]

## 2011-01-18 NOTE — Discharge Summary (Signed)
NAME:  Kent Nolan, Kent Nolan NO.:  [id number]   MEDICAL RECORD NO.:  [id number]          PATIENT TYPE:  INP   LOCATION:  3708                         FACILITY:  [facility]   PHYSICIAN:  Colleen Can. Deborah Chalk, M.D.DATE OF BIRTH:  06/11/1929   DATE OF ADMISSION:  05/04/2009  DATE OF DISCHARGE:  05/07/2009                               DISCHARGE SUMMARY   DISCHARGE DIAGNOSES:  1. Chest pain, status post elective cardiac catheterization showing      persistent patency of the stents in the left circumflex and right      coronary artery with minor irregularities and otherwise      unremarkable.  There were high normal left ventricular filling      pressures.  In light of these findings, it is felt that Kent Nolan      can best be managed medically.  He does not need repeat angioplasty      at this time.  2. Subendocardial myocardial infarction in February 2005 with stents      placed in the right coronary and left circumflex.  He did suffer in-      stent thrombosis of the right coronary and left circumflex on      October 16, 2008.  This was associated with cardiogenic shock and      he had repeat stenting to both of the right coronary and left      circumflex with non drug-eluting stents placed.  He is on chronic      Plavix.  3. Diabetes.  4. Hypertension.  5. Gastroesophageal reflux disease.  6. ACE related cough.  7. Hyperlipidemia.  8. History of atrial fibrillation and ventricular tachycardia in      February 2010 maintained in sinus rhythm on amiodarone.  9. Renal insufficiency.  10.Remote urinary tract infections.   HISTORY OF PRESENT ILLNESS:  Kent Nolan is a very pleasant 77 year old  white male who presented to the office with complaints of chest pain  over the past several days prior to admission.  It had been worse while  he had been outside working.  He rated it at 4 to 5 on a scale of 0-10.  He fell like it was identical to his previous chest pain syndrome.   He  was subsequently seen in the office and was referred for admission.   Please see the dictated history and physical for further patient  presentation and profile.   LABORATORY DATA:  On admission, his cardiac enzymes were negative.  Chest x-ray was negative.  BNP was 331.  TSH was 2.3.  Hemoglobin A1c is  6.6.  CBC was normal with hemoglobin 14, hematocrit 42, white count 7,  platelets 130.  Chemistries showed sodium 134, potassium 4.4, chloride  101, CO2 27, BUN 23, creatinine 2.3 and a glucose of 136.   HOSPITAL COURSE:  The patient was admitted electively.  He was placed on  IV nitroglycerin and IV heparin.  In light of his renal insufficiency,  it was felt to best hydrate the patient for 24 hours, as well as begin  Mucomyst before we proceeded on  with cardiac catheterization.  Cardiac  catheterization was performed on May 06, 2009.  That procedure was  tolerated well without any no known complications.  The right coronary  artery was a large dominant vessel and the stent is widely patent.  There are minor irregularities of less than 30% scattered throughout.  The left main was normal.  The LAD has diffuse irregularities with no  greater than 30-40% in general.  There was a 50% narrowing and a small  to moderate size diagonal in a diffuse manner.  The left circumflex has  a patent stent in the obtuse marginal branch.  There was an approximate  50% narrowing in the midportion, but overall lumen size appears to be  quite adequate and does not appear to be flow limiting.  In light of  these findings, he is felt to best be managed medically.  Postprocedure,  he was transferred to telemetry.  Nitroglycerin and heparin were  discontinued.  Today, on May 07, 2009, he is doing well without  complaints.  He has had no further episodes of chest pain.  His renal  function shows a BUN of 16 and creatinine 1.7.  Overall, he is felt to  be a satisfactory candidate for discharge  today.   DISCHARGE CONDITION:  Stable.   DISCHARGE DIET:  Low-salt diabetic.   He is to use an ice pack if needed to the groin.   ACTIVITY:  Increased slowly and as tolerated.   DISCHARGE MEDICINES:  1. TriCor 145 a day.  2. Aspirin daily.  3. Multivitamin daily.  4. Plavix 75 mg a day.  5. Pepcid 40 mg at bedtime.  6. Nitroglycerin p.r.n.  7. Crestor 20 mg a day.  8. Amiodarone 200 mg a day.  9. We have stopped his Cozaar at this point in time.   We plan on seeing him back in the office in approximately 2 weeks.  He  will need to have a followup 2-D echocardiogram on return.  He is to  call if any problems arise in the interim.   Greater than 30 minutes spent for discharge.      Sharlee Blew, N.P.      Colleen Can. Deborah Chalk, M.D.  Electronically Signed    LC/MEDQ  D:  05/07/2009  T:  05/08/2009  Job:  [id number]

## 2011-01-18 NOTE — Cardiovascular Report (Signed)
NAME:  Kent Nolan, CUBIT NO.:  [id number]   MEDICAL RECORD NO.:  [id number]          PATIENT TYPE:  INP   LOCATION:  2901                         FACILITY:  [facility]   PHYSICIAN:  Colleen Can. Deborah Chalk, M.D.DATE OF BIRTH:  1929-08-03   DATE OF PROCEDURE:  10/16/2008  DATE OF DISCHARGE:                            CARDIAC CATHETERIZATION   PROCEDURES:  Acute ST-elevation myocardial infarction, catheterization  with stent placement in the left circumflex marginal and in the proximal  right coronary artery because of simultaneous stent occlusions,  resuscitative efforts of temporary pacemaker, IABP, defibrillation x 4,  and iv pressors.   HISTORY:  [name] presents with acute onset of chest pain over  approximately 3 hours but certainly much more prominent of 2 hours prior  to admission.  He had had previous known drug-eluting stents in the left  circumflex and right coronary artery in 2005.  He had been doing well  clinically.   The patient was brought to the Catheterization Lab directly in  cardiogenic shock.  He had profound bradycardia with heart rates in the  30s and blood pressure in the 60s systolic range.   PROCEDURE:  The patient was prepped and draped.  We initially placed  sheaths in the right femoral artery and right femoral vein.  A temporary  pacemaker was placed in the right ventricular apex.  Atropine had been  given previously with only partial relief of bradyarrhythmia  hypotension.   At that point in time, coronary angiograms demonstrated a totally  occluded proximal right coronary artery.  The angiogram of the left  coronary artery showed mild narrowing in the left main coronary artery  and irregularities in the left anterior descending with no greater than  30-40% residual narrowing.  The left circumflex had a totally occluded  stent in the marginal vessel.   We then placed an IABP in the descending aorta via the right femoral  artery.   At  that point in time, we had occasional PVC but profound hypotension.  Dopamine was started.  IV bolus of 250 mg amiodarone was started and at  this time, defibrillated for the first of 4 times.  He received 4  different shocks of 200 watt seconds in the anterior posterior pads, of  course, over the next 30 minutes.   A sheath was placed in the left femoral artery for access to perform the  PCI.  All of the above resuscitative attempts delayed the door to  balloon time.   We turned our attention to the right coronary artery first.  Using a JR-  4 guide with side holes, a Prowater guidewire was passed into the right  coronary artery.  We pre-dilated with a 2.5 x 15 mm Apex and then  returned with a 3.5 x 23 mm Multi-Link stent.  The angiographic result  was excellent.  We had TIMI grade 3 flow at that point in time from the  totally occluded vessel earlier.  He remained hemodynamically unstable  and continue to have arrhythmia.   We turned our attention then to the left circumflex.  We used a  JL-4  guide and a Prowater guidewire.  We initially pre-dilated with a 2.5 x  15 mm Apex balloon and then placed a 2.5 x 18 mm Multi-Link MiniVision  stent inflated and post-dilatation with a 2.75 x 15 mm Quantum balloon.  This was a difficult procedure and not as much that the patient  continued to have arrhythmia during this aspect of the procedure but at  the Multi-Link stent had difficulty of passing into the other stented  segment around the acute angles.  There was also some difficulty with  guide catheter stability.   At this point in time and after both vessels were opened, the patient  became more stable and had increasing blood pressure.  Dopamine was  stopped.  He had a tachycardia and that stopped abruptly and was in a  rate of approximately 75-90 beats per minute.  Catheters were withdrawn.  Initially, we would take out the temporary pacemaker, but after it was  removed, it was noted  that he had no significant AV synchrony and had a  nodal rhythm and it was felt it would be best to put that temporary  pacemaker back in place and it was again positioned in the right  ventricular apex.   OVERALL IMPRESSION:  1. Acute ST-elevation myocardial infarction involving acute in-stent      thrombosis of 2 Taxus stents placed in 2005 with one stent being in      the proximal right coronary artery and the second stent being in      the left circumflex marginal.  2. Successful nondrug-eluting stents placed in the right coronary      artery and left circumflex coronary.      Colleen Can. Deborah Chalk, M.D.  Electronically Signed     SNT/MEDQ  D:  10/16/2008  T:  10/17/2008  Job:  [id number]

## 2011-01-18 NOTE — H&P (Signed)
NAME:  Kent Nolan, Kent Nolan NO.:  [id number]   MEDICAL RECORD NO.:  [id number]          PATIENT TYPE:  INP   LOCATION:  2919                         FACILITY:  [facility]   PHYSICIAN:  Colleen Can. Deborah Chalk, M.D.DATE OF BIRTH:  [birth year]   DATE OF ADMISSION:  05/04/2009  DATE OF DISCHARGE:                              HISTORY & PHYSICAL   CHIEF COMPLAINT:  Chest pain.   HISTORY OF PRESENT ILLNESS:  [name] is a very pleasant 77 year old  white male who has known ischemic heart disease.  Six months ago, he had  an acute ST elevation MI with MI involving acute in-stent thrombosis of  two Taxus stents placed in 2005, one in the lower right coronary and one  in the left circumflex.  He had repeat stenting at that time to both  lesions.  He did have associated cardiogenic shock.  He has basically  done well over the past 6 months.  He comes into the office today for  his routine follow-up visit.  He notes that he has begun to have  recurrent chest pain over the course of the past weekend.  It has been  worse while he has been outside and working.  He rates it a 4 to a 5 on  a scale of 0-10.  He does have chronic shortness of breath but does not  feel like it has worsened.  He has had some diaphoresis, as well as  radiation of the discomfort to his back.  He has been using some  nitroglycerin with relief only for the discomfort to return.  He is now  referred for repeat admission with plans for him to undergo cardiac  catheterization.   PAST MEDICAL HISTORY:  1. Subendocardial MI in February of 2005.  He had stents placed to the      right coronary and left circumflex at that time.  He had in-stent      thrombosis of the right coronary and left circumflex on October 16, 2008.  This was associated with cardiogenic shock.  He had      repeat stenting to the low right coronary and left circumflex with      a non drug-eluting stents placed.  He is on chronic Plavix.  2.  Diabetes.  3. Hypertension.  4. GERD.  5. Age-related cough.  6. Hyperlipidemia.  7. History of atrial fibrillation and ventricular tachycardia in      February of 2010.  8. Amiodarone therapy.  9. History of renal insufficiency.  10.Remote UTIs.  11.Right knee surgery in 1984.  12.He has had bilateral inguinal hernia surgeries in 1974.   ALLERGIES:  1. PENICILLIN.  2. QUESTIONABLY SULFA.   INTOLERANCES:  ACE INHIBITORS WHICH CAUSE A COUGH.   CURRENT MEDICATIONS:  1. Tricor 145 a day.  2. Cozaar 50 mg a day.  3. Aspirin daily.  4. Multivitamin daily.  5. Plavix 75 mg a day.  6. Pepcid 40 mg at bedtime.  7. Nitroglycerin p.r.n.  8. Crestor 20 mg a day.  9. Amiodarone 200 mg a day.  FAMILY HISTORY:  Father died with stomach cancer in 45.  His mother  died, but had not had a history of heart attack or stroke.  He has had a  brother who has had Alzheimer's disease.   SOCIAL HISTORY:  He is married.  He lives at home with his wife.  He has  had no tobacco products for over 25 years and prior to that smoked three  packs a day for about 15 years.  He has no alcohol or drug use.   REVIEW OF SYSTEMS:  He has been a little dizzy if he gets up too  quickly.  Blood pressure has been in the mid 90s systolic over the past  week or so.  He has had no frank syncope.  He has been a little bit more  worried in regards to the cost of his medicines.  He does bruise easily.  He has had some intermittent nausea.  All other review of systems are  negative.   PHYSICAL EXAMINATION:  GENERAL:  He is pleasant.  He is continuing to  complain of chest discomfort during our exam.  VITAL SIGNS:  Blood pressure was 124/74 sitting and 120/70 standing,  weight is 208 pounds, heart rate was 51 and regular.  SKIN:  Warm and dry.  Color was unremarkable.  HEENT:  Normocephalic, atraumatic.  Pupils are equal and reactive.  Sclerae is nonicteric.  NECK:  Supple.  No masses.  No JVD.  LUNGS:   Clear.  CARDIAC:  Regular rhythm with no murmur.  ABDOMEN:  Soft, positive bowel sounds, nontender.  EXTREMITIES:  Without edema.  MUSCULOSKELETAL:  Gait and range of motion intact.  NEUROLOGIC:  No gross focal deficits.   PERTINENT LABORATORIES:  His EKG shows a sinus bradycardia with poor R-  wave progression anteriorly.  There are no acute changes.  Other labs  are pending.   OVERALL IMPRESSION:  1. Chest pain consistent with unstable angina.  2. Known ischemic heart disease with recent ST elevation myocardial      infarction associated with cardiogenic shock due to thrombosis of      the right coronary and left circumflex stents in February of 2010.  3. Diabetes.  4. Hypertension.  5. Hyperlipidemia.   PLAN:  He will be admitted to the hospital.  He will be placed on IV  nitroglycerin and IV heparin.  Will check complete labs.  Will proceed  on with cardiac catheterization on Tuesday.  Further treatment plan to  follow per [name] discretion.      Sharlee Blew, N.P.      Colleen Can. Deborah Chalk, M.D.  Electronically Signed    LC/MEDQ  D:  05/04/2009  T:  05/04/2009  Job:  [id number]

## 2011-01-18 NOTE — H&P (Signed)
NAME:  Kent Nolan, MCENERY NO.:  [id number]   MEDICAL RECORD NO.:  [id number]          PATIENT TYPE:  INP   LOCATION:  2901                         FACILITY:  [facility]   PHYSICIAN:  Colleen Can. Deborah Chalk, M.D.DATE OF BIRTH:  [birth year]   DATE OF ADMISSION:  10/16/2008  DATE OF DISCHARGE:                              HISTORY & PHYSICAL   ADMISSION:  CHIEF COMPLAINT:  Acute ST elevation myocardial infarction.   HPI:  [name] was helping painting neighbor's house when he developed  mild chest pain approximately noon and then severe chest pain at 1 p.m.  with shortness of breath.  EMS was called and he was brought to the  catheterization lab.  He presented with profound chest pain, shortness  of breath, agonal rhythm, profound bradycardia, hypotension, and shock.  History obtained from EMS.   PAST MEDICAL HISTORY:  Remarkable for diabetes, hypertension,  gastroesophageal reflux, history of ACE-related cough, an abnormal CT  scan in September 2009, and history of hyperlipidemia.   He had previous drug-eluting stents in the proximal right coronary  artery and left circumflex coronary in 2005.   His last stress Cardiolite study was in July 2008 which showed an EF of  57% with no evidence of ischemia.   ALLERGIES:  Not able to be identified.   His medicines at the time of visit in November 2009 were,  1. Simvastatin 80 mg a day.  2. Metoprolol 25 mg b.i.d.  3. Finasteride 5 mg a day.  4. TriCor 145 mg day.  5. Cozaar 100 mg day.  6. Aspirin 81 mg a day.  7. Multivitamin.   Current med list is not able to be identified.   FAMILY HISTORY:  Positive for heart disease.   SOCIAL HISTORY:  He is married.  He has a supportive family.  He  previously retired as a Education administrator.  He is a nonsmoker.   PHYSICAL EXAMINATION:  GENERAL:  He was cyanotic and dyspneic on  presenting to the catheterization lab.  He continued to complain of  chest pain. His heart rate was in the 30's  and blood pressure was 60  systolic.  He was uttering painful moans, but would respond and move  appropriately.  SKIN: Cool and clammy.  HEENT: Normocephalic/atraumatic. Pupils are round. Conjunctiva appear  clear. Neck supple, no adenopathy.  LUNGS:  Decreased breath sounds. Visibly dyspneic.  HEART:  Agonal rhythm. Distant heart tones.  ABDOMEN:  Obese, positive bowel sounds. No tenderness appreciated.  EXTREMITIES:  No edema. Cool and clammy. ROM appears normal.  NEURO: Responds appropriately. Gait not tested.   OVERALL IMPRESSION:  1. Acute ST elevation myocardial infarction with catheterization      demonstrating acute occluded in-stent thrombosis involving 2 Taxus      stents with 1 of those being in the right coronary artery and the      second being in the left circumflex marginal vessel with successful      angioplasty and resuscitation performed.  2. Adult-onset diabetes mellitus.  3. Hypertension.  4. Gastroesophageal reflux.  5. History of abnormal CT scan questionably related  to prostate      issues.  6. History of elevated creatinine.   PLAN:  Patient was taken emergently to the cardiac catheterization lab.  Supportive care with usual management post myocardial infarction will  follow.      Colleen Can. Deborah Chalk, M.D.  Electronically Signed     SNT/MEDQ  D:  10/16/2008  T:  10/17/2008  Job:  [id number]

## 2011-01-18 NOTE — Discharge Summary (Signed)
NAME:  Kent Nolan, Kent Nolan NO.:  [id number]   MEDICAL RECORD NO.:  [id number]          PATIENT TYPE:  INP   LOCATION:  2036                         FACILITY:  [facility]   PHYSICIAN:  Colleen Can. Deborah Chalk, M.D.DATE OF BIRTH:  1928/12/31   DATE OF ADMISSION:  10/16/2008  DATE OF DISCHARGE:  10/24/2008                               DISCHARGE SUMMARY   DISCHARGE DIAGNOSES:  1. Acute ST-elevation myocardial infarction secondary to stent,      thrombosis of the right coronary as well as obtuse marginal.  2. Cardiogenic shock.  3. Ventricular tachycardia.  4. Diabetes.  5. Hyponatremia.  6. Chronic renal insufficiency.  7. Hypertension.  8. Gastroesophageal reflux disease.  9. Hyperlipidemia.  10.Transient atrial fibrillation, subsequently resolved.  11.Known ischemic heart disease with previous drug-eluting stents      placed in the proximal right coronary and left circumflex in 2005.   HISTORY OF PRESENT ILLNESS:  Kent Nolan is a 77 year old white male who  presented per EMS with acute ST-elevation MI.  He was helping paint his  neighbor house when he developed mild chest pain at approximately noon.  It was rather severe at 1 o'clock.  EMS was called and he presented to  the Catheterization Lab at 2:25 p.m. in the afternoon.  He had profound  chest pain, shortness of breath and agonal rhythm, profound bradycardia  as well as hypotension and shock.  He was subsequently taken directly to  the Cardiac Catheterization Lab for immediate and emergent intervention.   Please see the history and physical for further patient presentation and  profile.   LABORATORY DATA:  His troponin was over 100.  His peak MBs were over  500.  BUN was 31 and creatinine was 2.6.   His chest x-ray showed cardiomegaly.   OTHER PROCEDURES PERFORMED:  A 2-D echocardiogram interpreted on  October 17, 2008, showing the LV systolic function to be mildly  decrease of ejection fraction estimated to be  50%, aortic root was in  the upper limits of normal in size.   HOSPITAL COURSE:  The patient was taken emergently to the Cardiac  Catheterization Lab.  He was in cardiogenic shock upon his arrival.  He  had profound bradycardia with heart rates in the 30s and blood pressure  in the 60 systolic range.  Coronary angiograms were performed which  showed a totally occluded proximal right coronary and angiogram of the  left coronary showed mild narrowing in the left main and irregularities  of the LAD with no greater than 30-40% residual narrowing.  The left  circumflex had a totally occluded stent in the marginal vessel.  At this  time, intraaortic balloon pump was placed.  Inotropic support was  started.  The patient was bolused with amiodarone.  He was defibrillated  approximately 4 times due to recurrent ventricular tachycardia.  Subsequent temporary pacer wire was placed during that procedure as  well.  The right coronary artery was revascularized first.  A 3.5 x 23  mm Multi-Link stent was placed.  The overall angiographic result was  excellent.  Then, the attention was  turned to the left circumflex and  subsequently a 20.5 x 18 mm Multi-Link stent was inflated.  It was a  difficult procedure, and the patient continued to have arrhythmia during  this portion, but subsequently after both vessels were opened the  patient became more stable and had increasing blood pressure.  Dopamine  was able to be discontinued.  He was transferred to the Coronary Care  Unit in very critical condition.   The following day, the patient was noted to have some decrease in low  blood pressure.  He was started back on low-dose dopamine.  His troponin  was noted to have been over 100.  His CK-MBs were over 500.  However, he  continued to stabilize very nicely over the next several days.  Intraaortic balloon pump was discontinued on October 18, 2008.  Amiodarone was continued for ventricular tachycardia and  the patient did  have a few runs of atrial fibrillation during this admission as well.  His rate was controlled.  He did not require Coumadin anticoagulation.  By October 20, 2008, he was doing very well.  He did remain, however,  on low-dose dopamine and it was very difficult to wean that for quite  some time.  Clinically, he was improving.  He was subsequently  transferred to Step-Down.  He continued to have a short episode of  atrial fibrillation with good rate control, amiodarone was continued.  There was persistent hypotension with each attempt of dopamine trying to  be weaned; however, by October 22, 2008, that was subsequently able to  be discontinued and today on October 23, 2008, he is doing well.  He is  up in the chair.  He is ambulating with cardiac rehab.  His blood  pressure has stabilized.  He is currently in sinus rhythm.  At this  point in time, he is not felt to be a candidate for either ACE inhibitor  or ARB therapy as well as beta-blocker given the documented bradycardia  as well as hypotension.  It is our hope that as an outpatient these  medicines will be able to be reinitiated.  He subsequently will be  transferred to [facility] with plans for him to be discharged on the morning  of October 24, 2008, if he continues to stabilize nicely.   His laboratory data at discharge shows a hemoglobin of 9, hematocrit is  25, BUN if 24, creatinine is 1.6, and a glucose of 154.   DISCHARGE CONDITION:  Stable.   DISCHARGE DIET:  Diabetic, heart healthy.   ACTIVITY:  He is not to drive or engage in sexual activity.  Activity is  to be increased very slowly.   We will plan on seeing him back in the office in 1 week.  He is asked to  call to schedule that appointment.   DISCHARGE MEDICINES:  1. Coated aspirin 325 a day.  2. Plavix 75 mg a day.  3. Pepcid 40 mg at bedtime.  4. TriCor 145 a day.  5. Multivitamin daily.  6. Nitroglycerin p.r.n.  7. Crestor 20 mg a day.  8.  Amiodarone 200 mg a day.   He is reminded to not take his metoprolol, Cozaar, finasteride, or  simvastatin at this point in time.  Those medicines will need to be  readdressed at a later date.  He is to call our office if any problems  arise in the interim, otherwise we will plan on seeing him back in 1  week.  Greater than 30 minutes was involved in this discharge planning.      Sharlee Blew, N.P.      Colleen Can. Deborah Chalk, M.D.  Electronically Signed    LC/MEDQ  D:  10/23/2008  T:  10/23/2008  Job:  [id number]

## 2011-01-21 NOTE — Cardiovascular Report (Signed)
NAME:  [place] Nolan, Kent Nolan                        ACCOUNT NO.:  [id number]   MEDICAL RECORD NO.:  [id number]                   PATIENT TYPE:  INP   LOCATION:  2034                                 FACILITY:  [facility]   PHYSICIAN:  Colleen Can. Deborah Chalk, M.D.            DATE OF BIRTH:  1928/09/21   DATE OF PROCEDURE:  10/29/2003  DATE OF DISCHARGE:                              CARDIAC CATHETERIZATION   PROCEDURES PERFORMED:  1. Left heart catheterization.  2. Selective coronary angiography.  3. Left ventricular angiography.  4. Stent placement in the right coronary artery.  5. Stent placement to the left circumflex coronary.   CARDIOLOGIST:  Colleen Can. Deborah Chalk, M.D.   HISTORY:  Kent Nolan presented with unstable angina with positive cardiac  enzymes.  He was referred for catheterization.   TYPE AND SITE OF ENTRY:  Percutaneous right femoral artery.   CATHETERS:  Six French 4 curved Judkins right and left coronary catheters.  Six French pigtail ventriculographic catheter.  Seven [place] JR-4 guide with  side holes.  Seven [place] FL4 guide.  Hi-Torque floppy guidewire.  A 2.5 x  15 mm Maverick balloon, stent used for the right coronary artery was a 3.5 x  20 mm TAXUS stent with the stent used for the left circumflex being a 2.5 x  12 mm TAXUS stent.   MEDICATIONS:  Medication given prior to the procedure; Valium 10 mg p.o.   Medication given during the procedure;  heparin, nitroglycerin, Versed 2 mg  IV, fentanyl a total of 75 mcg, intracoronary verapamil 200 mcg, and  intracoronary adenosine 12 mcg.   COMMENTS:  In general the patient tolerated the procedure well, but  developed chest pain after injection of the right coronary artery.  The  right coronary artery was never totally occluded.  We proceeded with haste  to perform angioplasty on the right coronary artery.  After angioplasty and  stent placement he had no reflow, but responded with the first  dose of  verapamil with brisk  flow an washout.   HEMODYNAMIC DATA:  The aortic pressure was 105/56.  LV was 143/10-18.  On  pullback the aortic pressure was 155/79 and LV was 155/7-15.   ANGIOGRAPHIC DATA:  1. Left Main Coronary Artery:  The left main coronary artery is short,     normal.   1. Left Circumflex:  The left circumflex is a moderate-sized vessel.  In the     obtuse marginal there is a 90% focal stenosis.  It bifurcates just distal     to this.  There is a small continuation branch.   1. Left Anterior Descending:  The left anterior descending has diffuse     irregularities proximally.  There is at least 50-70% narrowing in the     first diagonal branch.  There is no significant obstructive disease in     the left anterior descending, but there is diffuse atherosclerosis.   1.  Right Coronary Artery:  The right coronary artery is a very large     dominant vessel.  There is a 95% plus proximal stenosis, which is     irregular and has the appearance of a ruptured plaque.  At the acute     margin there is 30-50% narrowing.  The distal vessel is quite large.   1. Left Ventricular Angiogram:  The left ventricular angiogram was performed     in the RAO position.  The overall cardiac and silhouette are normal.  The     global ejection fraction is 55-60%.  Regional wall motion appears to be     normal.   DESCRIPTION OF INTERVENTIONAL PROCEDURES:  The patient developed chest pain  while films were being reviewed.  With as much haste as reasonably possible  we proceeded on with intervention on the right coronary artery.  We  initially attempted to use 6 [place] guides, but there was twisting and  kinking of these catheters in the iliac vessels.  We then returned with a  long sheath and used 7 [place] guides.  The right coronary artery guide was a  7 [place] JR-4 guide with side holes.  Hi-Torque floppy guidewire was passed  distally.  We predilated with a 2.5 x 15 mm Maverick balloon and then  returned with a 3.5  x 20 mm TAXUS stent inflated to a total of 18  atmospheres on two occasions.  The final angiographic result appeared to be  excellent, but we did have non-reflow distally.  Verapamil 200 mcg was given  intracoronary with prompt restitution of flow.  The patient did continue to  have chest pain.  At that point in time we felt that we needed to proceed on  with angioplasty and intervention on the left circumflex since that could  have been the culprit vessel initially.  We used an FL4 guide and passed the  Hi-Torque floppy across the lesion.  We predilated with a 2.5 x 15 mm  Maverick balloon and then placed a 2,5 x 12 mm TAXUS stent inflated to a  maximum of 12 atmospheres.  Final angiographic result was felt to be  excellent.  The patient was continuing to still have chest pain.  We, once  again, went back, took pictures of the right coronary artery and flow  appeared to be quite satisfactory.  However, at that point in time we gave  12 mcg of adenosine intracoronary and once again persisted in a very  satisfactory way, and chest pain only gradually resolved.  Overall it was  felt to be a satisfactory result.   OVERALL IMPRESSION:  1. Normal left ventricular function.  2. Successful stent placement in the right coronary artery and left     circumflex system.  3. Mild-to-moderate disease diffusely in the left anterior descending.                                               Colleen Can. Deborah Chalk, M.D.    SNT/MEDQ  D:  10/29/2003  T:  10/30/2003  Job:  [id number]   cc:   Al Decant. Janey Greaser, MD  [address]  [place]  [place] [address]  Fax: [phone]   Jackie [place], M.D.

## 2011-01-21 NOTE — Discharge Summary (Signed)
NAME:  Kent Nolan, Kent Nolan                        ACCOUNT NO.:  [id number]   MEDICAL RECORD NO.:  [id number]                   PATIENT TYPE:  INP   LOCATION:  2034                                 FACILITY:  [facility]   PHYSICIAN:  Colleen Can. Deborah Chalk, M.D.            DATE OF BIRTH:  10/20/1928   DATE OF ADMISSION:  10/28/2003  DATE OF DISCHARGE:  10/31/2003                                 DISCHARGE SUMMARY   PRIMARY DISCHARGE DIAGNOSIS:  Chest pain with subsequent acute myocardial  infarction with subsequent elective cardiac catheterization as well as stent  placement to the right coronary as well as left circumflex system.  There is  mild to moderate disease diffusely in the left anterior descending.   SECONDARY DISCHARGE DIAGNOSES:  1. Newly diagnosed diabetes to initiate dietary management.  2. Hypertension, currently controlled.  3. Gastroesophageal reflux disease to be maintained on proton pump     inhibitor.   HISTORY OF PRESENT ILLNESS:  The patient is a 77 year old male who has  hypertension as well as gastroesophageal reflux disease.  He presented with  complaints of chest pain reported as a pressure-like sensation that was a 10  on a scale of 0 to 10 and was located in the midsternal region. It lasted  for approximately one hour.  It was associated with shortness of breath,  nausea, but no vomiting and no diaphoresis.  He was seen on the day prior to  admission and had been started on Nexium but that really did not relieve his  symptoms.  When he was given nitroglycerin, his pain did subside.  He was  subsequently seen and evaluated and admitted for further medical management.  Please see the dictated history and physical for further patient  presentation and profile.   LABORATORY DATA:  His troponin was elevated at 0.011.  Sodium 135, potassium  4.0, chloride 102, CO2 25, BUN 22, creatinine 1.5, and glucose 203. A CBC  showed hemoglobin 17 and hematocrit 49.   A 12-lead  electrocardiogram showed sinus rhythm with IVCD.   HOSPITAL COURSE:  The patient was admitted.  He was seen in consultation by  Dr. Colleen Can. Tennant.  He was noted to have elevated cardiac enzymes yet  was pain-free. We proceeded on with cardiac catheterization the following  day. That procedure was tolerated well without any known complications.  The  left main was normal. The left circumflex was a 90% plus narrowing in the  obtuse marginal branch that was mild diffuse irregularities of the LAD.  The  right coronary artery had a 95% plus narrowing of the proximal portion.  It  was a large vessel.  There was a 40% narrowing at the acute marginal branch.  Ejection fraction was 55-60% and was felt to be normal.   Subsequent PCI was performed to both right coronary as well as left  circumflex.  Two Taxus stents were subsequently placed.  There is  a 3.5 x 20  mm Taxus stent in the right coronary and a 2.5 x 12 mm Taxus stent in the  left circumflex.  Postprocedure, he was transferred back to 2900.  He did  have an episode of hypotension requiring short-term use of inotropic  therapy.  Following sheath removal, blood pressure normalized and by  October 30, 2003 he was doing well without complaint.   Today on October 31, 2003, he is doing well.  He has been up and ambulatory  without problems.  Blood pressure has been satisfactory.  Groin is  unremarkable.   Discharge labs show a sodium of 132, potassium 4.0, chloride 98, CO2 27, BUN  29, creatinine 1.7, and a glucose of 127.  CBC is normal.   Overall, he is felt to be stable for discharge today with further evaluation  on an outpatient basis.   CONDITION ON DISCHARGE:  Stable.   DISCHARGE MEDICATIONS:  1. We will resume Lisinopril 20 mg q.d.  2. Maxzide q.d.  3. Baby aspirin q.d.  4. Nexium 40 mg q.d.  5. We will be adding Plavix 75 mg q.d. for the next six months.  6. Mevacor 20 mg for cholesterol.  7. Toprol XL 25 mg q.d.  8.  Nitroglycerin p.r.n.   ACTIVITY:  His activity is to be light with no strenuous activity and no  driving.   DIET:  Diabetic.   WOUND CARE:  He is to place an ice pack to the groin if needed and otherwise  call our office for problems.   FOLLOW UP:  We will ask to see him back in one week and certainly sooner if  problems arise.  We will have him see Dr. Al Decant. Foreman in two to three  weeks for follow up of his blood sugars.      Kent Nolan, N.P.                 Colleen Can. Deborah Chalk, M.D.    LCO/MEDQ  D:  10/31/2003  T:  10/31/2003  Job:  [id number]   cc:   Al Decant. Janey Greaser, MD  [address]  [place]  [place] [address]  Fax: [phone]

## 2011-01-21 NOTE — Discharge Summary (Signed)
NAME:  Kent Nolan, Kent Nolan NO.:  [id number]   MEDICAL RECORD NO.:  [id number]                   PATIENT TYPE:   LOCATION:                                       FACILITY:   PHYSICIAN:  Deirdre Peer. Polite, M.D.              DATE OF BIRTH:  Mar 16, 1929   DATE OF ADMISSION:  06/30/2002  DATE OF DISCHARGE:  07/01/2002                                 DISCHARGE SUMMARY   DISCHARGE DIAGNOSES:  1. Chest pressure with CK negative for ischemia, electrocardiogram without     acute change, dobutamine Cardiolite negative or ischemia, ejection     fraction 60%, possible gastrointestinal cause, i.e., indigestion or     reflux for outpatient evaluation.  2. Hypertension x 4 to 5 years.  3. Recently treated urinary tract infection on outpatient basis with     Bactrim.  4. Papillary rash on the torso, differential includes contact versus delayed     reaction to sulfa.   DISCHARGE MEDICATIONS:  1. Prednisone 40 mg for 3 days.  2. Axid 150 mg 1 every 12 hours x 4 days.  3. Benadryl 25 mg 1 every 8 hours as needed for itching.  4. Lisinopril 10 mg 1 daily.  5. Hydrochlorothiazide 25 mg 1 daily.   CONSULTATIONS:  Colleen Can. Deborah Chalk, M.D., cardiology.   LABORATORY DATA:  Chest x-ray shows no apparent disease.   CK and troponin serial enzymes negative for ischemia.  UA was negative.  CBC  was within normal limits.  BMET within normal limits.  Creatinine 1.4.   EKG: Normal sinus rhythm with no Q or ST elevation.   DISPOSITION:  To home.   FOLLOW UP:  Dr. Janey Greaser in approximately one to two weeks.   HISTORY OF PRESENT ILLNESS:  The patient is a 77 year old white male with  known history of hypertension and obesity, evaluated in the [facility] walk-in  clinic.  There the patient was evaluated and gave a history of having  decreased exercise tolerance x 1 week associated with substernal chest  pressure, shortness of breath, lightheadedness, and nausea which improved  with  rest.  The patient described the sensation as indigestion, stating it  was 3 to 4/10 without radiation.  The patient was sent to the ED for further  evaluation.  In the ED, the patient was evaluated, treated with  nitroglycerin MSO4 with resultant hypotension transiently systolic which was  responsive to IV fluids.  The patient had studies ordered including chest x-  ray, CK, troponin I, EKG which were without acute changes.  [facility] were called for further evaluation.   At the time of my exam, the patient was hemodynamically stable without any  acute stress.  Of note, the patient states that he has been treated recently  for a UTI on the outpatient basis with antibiotics for seven days and also  has recently had medications increased for high blood  pressure which may  have been a contributing factor to his elevated blood pressure.   PAST MEDICAL HISTORY:  As stated in the problem list.   MEDICATIONS:  1. Hydrochlorothiazide 25 mg 1 daily.  2. Lisinopril 10 mg 1 daily which he has been on for 1 weeks.  3. The patient states he completed a 7-day course of Bactrim.   SOCIAL HISTORY:  Negative for tobacco x 20 years, previously 3 packs a day  for about 15 years.  No alcohol and no drugs.   PAST SURGICAL HISTORY:  Pinning of right knee in 1984.  Denies appendectomy  and cholecystectomy.  Does admit to left and right inguinal hernia repair in  1974.   ALLERGIES:  The patient describes allergy to PENICILLIN which causes a rash.   FAMILY HISTORY:  Mother deceased.  Denies myocardial infarction or CVA.  Dad  had diabetes.  The patient's father is deceased with stomach cancer in 64.  The patient has one brother who has Alzheimer's.  The patient has sisters  who are healthy.   REVIEW OF SYSTEMS:  As stated in the HPI.  In addition, the patient had  complaint of vague sensation of food getting stuck when he eats.  Denies an  early satiety, no weight loss, no nausea, vomiting,  or emesis.   PHYSICAL EXAMINATION:  VITAL SIGNS:  Blood pressure 153/83, temperature  97.1, respiratory rate 16, pulse 81.  HEENT:  Pupils are equal, round, and reactive to light.  Anicteric.  NECK:  Supple, no adenopathy, no carotid bruit, no oral lesions.  CHEST:  Clear to auscultation bilaterally.  No reproducible pain with  palpation.  CARDIOVASCULAR.  Regular S1, S2, no S3.  ABDOMEN:  Obese.  Positive bowel sounds.  Organomegaly appreciated.  EXTREMITIES:  No clubbing, cyanosis, or edema, 2+ pulse.  NEUROLOGIC:  Nonfocal.  RECTAL:  Heme negative.   ADDITIONAL LABORATORY DATA:  EKG without acute change.   BMET significant for creatinine 1.4.   Chest x-ray negative for cardiomegaly or pneumonia.   CBC and CK within normal limits.   HOSPITAL COURSE:  1. CARDIAC:  Rule out unstable angina in elderly white male with history of     hypertension with progressive deceased in exercise tolerance x 1 week and     recurrent chest pressure relieved by rest and relieved by nitroglycerin     in the ED.  The patient was admitted to a telemetry floor bed, had serial     enzymes, all of which were negative.  The patient underwent dobutamine     Cardiolite stress test the following morning with EF of 60%.  No signs of     any ischemia.  The patient has not had any shortness of breath or chest     pain since being in hospital. Denies any sensation of indigestion.  He     has been ambulating in the hall without any problems.  He has been     tolerating all meals without any problems.  At this time, the patient is     deemed medically stable for discharge and will have further outpatient     followup if he has recurrent symptoms of indigestion.  He will be sent     home on antacid and, if indicated, may on outpatient basis require     further evaluation.  1. HYPERTENSION, INITIALLY HYPOTENSION ON ADMISSION:  Responsive to fluids.     Patient's blood pressure was elevated in 150s/80s with no  problem of     recurrent hypotension during this hospitalization.  The patient was     resolved on outpatient medications.   1. GASTROINTESTINAL SYMPTOMS OF FOOD GETTING STUCK:  At this time, it is     resolved.  The patient has tolerated three meals a day without any     symptoms of food getting stuck, solids or liquids.  Again as stated in     problem #1, if the patient has recurrent symptoms, may want further     outpatient evaluation.   1. RASH:  During the second day of hospitalization, the patient experienced     a macular rash across his torso, arms, and back.  Initially denied any     pruritus but ultimately began to complain of a little pruritus.  The     patient has been on a new medication started a week ago and actually a     one-week course of Bactrim.  His rash is probably related to that versus     contact rash from hospital sheets.  The patient will be treated     symptomatically with Benadryl, prednisone, and H2 blocker for     antihistamine release.  At this time, the patient is medically stable.   1. TREATED URINARY TRACT INFECTION:  As stated, the patient has had a repeat     UA which was negative for any bacteria.   The patient is medically stable for discharge and is asked to follow up with  his primary MD in one to two weeks.  Thank you in advance.                                                Deirdre Peer. Polite, M.D.    RDP/MEDQ  D:  07/01/2002  T:  07/02/2002  Job:  [id number]   cc:   Al Decant. Janey Greaser, M.D.  [address]  [place]  [place] [address]  Fax: [phone]

## 2011-01-21 NOTE — Op Note (Signed)
   NAME:  Kent Nolan, [name]                        ACCOUNT NO.:  [id number]   MEDICAL RECORD NO.:  [id number]                   PATIENT TYPE:  AMB   LOCATION:  ENDO                                 FACILITY:  [facility]   PHYSICIAN:  Petra Kuba, M.D.                 DATE OF BIRTH:  05-Aug-1929   DATE OF PROCEDURE:  06/10/2003  DATE OF DISCHARGE:                                 OPERATIVE REPORT   PROCEDURE:  Colonoscopy and biopsy.   INDICATIONS:  Patient with history of colon polyps, due for repeat  screening.  Consent was signed after risks, benefits, methods, and options  were thoroughly discussed in the office in the past.   PREMEDICATIONS:  Demerol 60 mg, Versed 6 mg.   DESCRIPTION OF PROCEDURE:  Rectal inspection pertinent for external  hemorrhoids.  Digital exam was negative.  Video colonoscope was inserted and  easily advanced around the colon to the level of the ileocecal valve.  At  that junction, the scope began to loop and with abdominal pressure, was were  able to readvance to the cecal pole.  No abnormality was seen on insertion.  Prep was adequate.  There was some liquid stool that required washing and  suctioning.  On slow withdrawal through the colon, other than a questionable  edematous fold in the proximal transverse which was cold biopsied x 2, no  polyps were seen.  Anal rectal pull-thru and retroflexion revealed some  small hemorrhoids.  The scope was reinserted a short ways up the left side  of the colon.  Air was suctioned and the scope removed.  The patient  tolerated the procedure well.  There were no obvious immediate  complications.   ENDOSCOPIC DIAGNOSES:  1. Internal and external hemorrhoids.  2. Questionable edematous fold in the proximal transverse cold biopsy.  3. Otherwise within normal limits to the cecum without any obvious polyps     being seen.   PLAN:  Await pathology.  Yearly rectals and guaiacs per Dr. Janey Greaser.  Happy  to see back p.r.n.  If  doing well medically, would consider repeat screening  in five years.                                               Petra Kuba, M.D.    MEM/MEDQ  D:  06/10/2003  T:  06/10/2003  Job:  [id number]   cc:   Al Decant. Janey Greaser, MD  [address]  [place]  [place] [address]  Fax: [phone]

## 2011-01-21 NOTE — H&P (Signed)
NAME:  Kent Nolan, Kent Nolan                        ACCOUNT NO.:  [id number]   MEDICAL RECORD NO.:  [id number]                   PATIENT TYPE:  INP   LOCATION:  1823                                 FACILITY:  [facility]   PHYSICIAN:  Kela Millin, M.D.             DATE OF BIRTH:  1928/09/27   DATE OF ADMISSION:  10/28/2003  DATE OF DISCHARGE:                                HISTORY & PHYSICAL   PRIMARY CARE PHYSICIAN:  Dr. Janey Greaser.   CHIEF COMPLAINT:  Chest pain.   HISTORY OF PRESENT ILLNESS:  The patient is a 77 year old white male with a  past medical history significant for hypertension and gastroesophageal  reflux disease, who presents with the complaints of chest pain,  intermittent, over the past four days.  The patient describes the chest pain  as a pressure 10/10 in intensity, and located in the mid-sternal area.  He  reports that the chest pain is exertional and lasts for about one hour at a  time.  The patient also states that the pain is associated with shortness of  breath, nausea, but no vomiting and diaphoresis, and radiates to his back.  The patient reports that he was seen by his primary care physician, Dr.  Janey Greaser, on the day prior to admission and was started on Nexium, but that  did not relieve the pain.  On arrival to the emergency room, he was given nitroglycerin, and the pain  decreased from 10 to about 3/10 at the time that he was examined.  The  patient was admitted in 2003, in this hospital with similar complaints, and  a Cardiolite stress test done by Dr. Colleen Can. Deborah Chalk showed no ischemia  at that time.  The patient reports that he has a cough intermittently, which is mostly  nonproductive and has not changed in character.  He denies fevers.  He also  denies dysuria, hematuria, melena, hematochezia, hematemesis.  He reports a  headache just intermittently when he has the chest pains.   PAST MEDICAL HISTORY:  As stated above.   MEDICATIONS:  1.  Lisinopril 20 mg p.o. daily.  2. Maxzide 25/37.5 mg.  3. Aspirin 81 mg daily.  4. Nexium 20 mg p.o. b.i.d., started on the day of PTA.  5. Vitamins.   ALLERGIES:  PENICILLIN, SULFA/BACTRIM - rash.   SOCIAL HISTORY:  He states that he quit tobacco more than 40 years ago.  Denies alcohol and illicit drug use.   FAMILY HISTORY:  His mother had diabetes mellitus.  His dad had stomach  cancer.  Two of his sisters had uterine cancer.   REVIEW OF SYSTEMS:  As per the HPI.  Other comprehensive review of systems  negative.   PHYSICAL EXAMINATION:  GENERAL:  The patient is an obese elderly white male,  in no acute distress.  VITAL SIGNS:  Blood pressure 154/95, pulse 93, respirations 24, O2  saturation 97%.  HEENT:  Normocephalic,  atraumatic.  Pupils equal, round, reactive to light  and accommodation.  EOMI.  Oropharynx:  No exudates.  Moist mucous  membranes.  NECK:  Supple, no carotid bruits appreciated.  No jugular venous distention.  No thyromegaly.  LUNGS:  Clear to auscultation bilaterally.  No crackles or wheezes.  CARDIOVASCULAR:  Normal S1, S2.  Regular rate and rhythm.  No S4  appreciated.  ABDOMEN:  Obese, bowel sounds present, nontender, non-distended.  No  organomegaly.  No masses palpable.  EXTREMITIES:  No clubbing, cyanosis, or edema.  NEUROLOGIC:  Alert and oriented x3.  Cranial nerves II-XII grossly intact.  A nonfocal examination.   LABORATORY DATA:  The troponin is 0.11, myoglobin is 204.  Sodium 135,  potassium 4.0, chloride 102, CO2 of 25, glucose 203, BUN 22, creatinine 1.5,  calcium 9.3.  Total protein 7.1, albumin 3.6, AST 28. ALT 26, alkaline  phosphatase 82, total bilirubin 1.1.  The white cell count is 7.8,  hemoglobin 17.3, hematocrit 49.6, platelet count 163.   ASSESSMENT/PLAN:  1. Chest pain/probable unstable angina:  Will continue a nitroglycerin drip.     Will start the patient on subcutaneous Lovenox at 1 mg per kg q.12h.  I     will continue the  daily aspirin and add Plavix.  I have consulted Dr.     Deborah Chalk, cardiologist.  I will also continue the Lisinopril and order a     fasting lipid profile.  I will also continue the proton pump inhibitor to     cover for possible GI source.  2. Gastroesophageal reflux disease:  I will continue the Protonix as above.  3. Hypoglycemia:  Will do Accu-Cheks q.a.c. and q.h.s.  Check a hemoglobin     A1c, and obtain a fasting blood glucose in the a.m.  I will cover the     patient with sliding scale Regular insulin, and have him put on a     diabetic diet.                                                Kela Millin, M.D.    ACV/MEDQ  D:  10/28/2003  T:  10/28/2003  Job:  [id number]

## 2011-01-25 ENCOUNTER — Other Ambulatory Visit: Payer: Self-pay | Admitting: Cardiology

## 2011-01-25 MED ORDER — ROSUVASTATIN CALCIUM 20 MG PO TABS: 20.0000 mg | ORAL_TABLET | Freq: Every day | ORAL | Status: DC

## 2011-01-25 NOTE — Telephone Encounter (Signed)
Med refill

## 2011-02-10 ENCOUNTER — Encounter: Payer: Medicare Other | Admitting: *Deleted

## 2011-02-15 ENCOUNTER — Encounter: Payer: Self-pay | Admitting: *Deleted

## 2011-02-17 ENCOUNTER — Other Ambulatory Visit: Payer: Self-pay | Admitting: Internal Medicine

## 2011-02-17 ENCOUNTER — Ambulatory Visit (INDEPENDENT_AMBULATORY_CARE_PROVIDER_SITE_OTHER): Payer: Medicare Other | Admitting: *Deleted

## 2011-02-17 DIAGNOSIS — I498 Other specified cardiac arrhythmias: Secondary | ICD-10-CM

## 2011-02-17 DIAGNOSIS — I4891 Unspecified atrial fibrillation: Secondary | ICD-10-CM

## 2011-02-21 NOTE — Progress Notes (Signed)
Pacer remote check  

## 2011-03-11 ENCOUNTER — Encounter: Payer: Self-pay | Admitting: *Deleted

## 2011-03-28 ENCOUNTER — Other Ambulatory Visit: Payer: Self-pay | Admitting: *Deleted

## 2011-03-28 MED ORDER — FUROSEMIDE 40 MG PO TABS: 40.0000 mg | ORAL_TABLET | Freq: Every day | ORAL | Status: DC

## 2011-03-28 NOTE — Telephone Encounter (Signed)
escribe medication per fax request  

## 2011-05-23 ENCOUNTER — Other Ambulatory Visit: Payer: Self-pay | Admitting: *Deleted

## 2011-05-23 MED ORDER — FAMOTIDINE 40 MG PO TABS: 40.0000 mg | ORAL_TABLET | Freq: Every day | ORAL | Status: DC

## 2011-05-23 NOTE — Telephone Encounter (Signed)
Refilled Meds from fax  

## 2011-05-31 ENCOUNTER — Encounter: Payer: Self-pay | Admitting: Internal Medicine

## 2011-06-01 ENCOUNTER — Encounter: Payer: Self-pay | Admitting: Internal Medicine

## 2011-06-01 ENCOUNTER — Ambulatory Visit (INDEPENDENT_AMBULATORY_CARE_PROVIDER_SITE_OTHER): Payer: Medicare Other | Admitting: Internal Medicine

## 2011-06-01 DIAGNOSIS — I10 Essential (primary) hypertension: Secondary | ICD-10-CM

## 2011-06-01 DIAGNOSIS — I2510 Atherosclerotic heart disease of native coronary artery without angina pectoris: Secondary | ICD-10-CM

## 2011-06-01 DIAGNOSIS — I4891 Unspecified atrial fibrillation: Secondary | ICD-10-CM

## 2011-06-01 DIAGNOSIS — I498 Other specified cardiac arrhythmias: Secondary | ICD-10-CM

## 2011-06-01 LAB — PACEMAKER DEVICE OBSERVATION
AL IMPEDENCE PM: 440 Ohm
AL THRESHOLD: 3 V
RV LEAD IMPEDENCE PM: 352 Ohm
RV LEAD THRESHOLD: 1 V

## 2011-06-01 LAB — HEPATIC FUNCTION PANEL
Bilirubin, Direct: 0.1 mg/dL (ref 0.0–0.3)
Total Bilirubin: 0.7 mg/dL (ref 0.3–1.2)

## 2011-06-01 LAB — BASIC METABOLIC PANEL
BUN: 32 mg/dL — ABNORMAL HIGH (ref 6–23)
Chloride: 104 mEq/L (ref 96–112)
Creatinine, Ser: 2.5 mg/dL — ABNORMAL HIGH (ref 0.4–1.5)
GFR: 26.04 mL/min — ABNORMAL LOW (ref >60.00)

## 2011-06-01 LAB — TSH: TSH: 2.35 u[IU]/mL (ref 0.35–5.50)

## 2011-06-01 NOTE — Assessment & Plan Note (Signed)
Normal pacemaker function See Pace Art report No changes today  

## 2011-06-01 NOTE — Assessment & Plan Note (Signed)
Stable No change required today  bmet today 

## 2011-06-01 NOTE — Patient Instructions (Signed)
Your physician wants you to follow-up in: 6 months with Dr Lona Millard will receive a reminder letter in the mail two months in advance. If you don't receive a letter, please call our office to schedule the follow-up appointment.   Remote monitoring is used to monitor your Pacemaker of ICD from home. This monitoring reduces the number of office visits required to check your device to one time per year. It allows [place] to keep an eye on the functioning of your device to ensure it is working properly. You are scheduled for a device check from home on 09/01/11. You may send your transmission at any time that day. If you have a wireless device, the transmission will be sent automatically. After your physician reviews your transmission, you will receive a postcard with your next transmission date.Your physician recommends that you return for lab work today  TSH/T4/BMP/Hepatic

## 2011-06-01 NOTE — Assessment & Plan Note (Signed)
Stable No change required today  

## 2011-06-01 NOTE — Progress Notes (Signed)
The patient presents today for routine electrophysiology followup.  Since last being seen in our clinic, the patient reports doing reasonably well. He has chronic stable SOB.  He has occasional dizziness upon standing.  No new complaints today.  Today, he denies symptoms of palpitations, chest pain, orthopnea, PND, lower extremity edema, dizziness, presyncope, syncope, or neurologic sequela.  The patient feels that he is tolerating medications without difficulties and is otherwise without complaint today.   Past Medical History  Diagnosis Date  . Coronary artery disease   . Arrhythmia   . Diabetes mellitus   . GERD (gastroesophageal reflux disease)   . Hyperlipidemia   . Chronic kidney disease    Past Surgical History  Procedure Date  . Insert / replace / remove pacemaker     Current Outpatient Prescriptions  Medication Sig Dispense Refill  . amiodarone (PACERONE) 200 MG tablet Take 200 mg by mouth daily.        Marland Kitchen aspirin 81 MG tablet Take 81 mg by mouth daily.        . clopidogrel (PLAVIX) 75 MG tablet Take 75 mg by mouth daily.        . famotidine (PEPCID) 40 MG tablet Take 1 tablet (40 mg total) by mouth daily.  30 tablet  4  . fenofibrate (TRICOR) 145 MG tablet Take 145 mg by mouth daily.        . fish oil-omega-3 fatty acids 1000 MG capsule Take 1 g by mouth 2 (two) times daily.        . furosemide (LASIX) 40 MG tablet Take 1 tablet (40 mg total) by mouth daily.  30 tablet  3  . Multiple Vitamin (MULTIVITAMIN) tablet Take 1 tablet by mouth daily.        . nitroGLYCERIN (NITROSTAT) 0.4 MG SL tablet Place 0.4 mg under the tongue every 5 (five) minutes as needed.        . rosuvastatin (CRESTOR) 20 MG tablet Take 1 tablet (20 mg total) by mouth at bedtime.  30 tablet  5    Allergies  Allergen Reactions  . Penicillins   . Sulfonamide Derivatives     History   Social History  . Marital Status: Married    Spouse Name: N/A    Number of Children: N/A  . Years of Education: N/A     Occupational History  . Not on file.   Social History Main Topics  . Smoking status: Never Smoker   . Smokeless tobacco: Not on file  . Alcohol Use: Not on file  . Drug Use: Not on file  . Sexually Active: Not on file   Other Topics Concern  . Not on file   Social History Narrative  . No narrative on file    ROS-  All systems are reviewed and are negative except as outlined in the HPI above   Physical Exam: Filed Vitals:   06/01/11 1022  BP: 118/75  Pulse: 50  Height: 5\' 7"  (1.702 m)  Weight: 216 lb 1.9 oz (98.031 kg)    GEN- The patient is well appearing, alert and oriented x 3 today.   Head- normocephalic, atraumatic Eyes-  Sclera clear, conjunctiva pink Ears- hearing intact Oropharynx- clear Neck- supple, no JVP Lymph- no cervical lymphadenopathy Lungs- Clear to ausculation bilaterally, normal work of breathing Chest- pacemaker pocket is well healed Heart- Regular rate and rhythm, no murmurs, rubs or gallops, PMI not laterally displaced GI- soft, NT, ND, + BS Extremities- no clubbing, cyanosis, or  edema  Pacemaker interrogation- reviewed in detail today,  See PACEART report  Assessment and Plan:

## 2011-06-01 NOTE — Assessment & Plan Note (Signed)
Presently in sinus rhythm with amiodarone Check LFTs and TFTs today  Continue ASA and Plavix as per Dr Deborah Chalk.  Consider coumadin if coronary disease remains stable upon return.

## 2011-06-02 ENCOUNTER — Telehealth: Payer: Self-pay | Admitting: *Deleted

## 2011-06-02 DIAGNOSIS — N289 Disorder of kidney and ureter, unspecified: Secondary | ICD-10-CM

## 2011-06-02 NOTE — Telephone Encounter (Signed)
Will come in on 06/07/11 for repeat BMP at 8:30am

## 2011-06-02 NOTE — Telephone Encounter (Signed)
Message copied by Deliah Boston on Thu Jun 02, 2011 12:40 PM ------      Message from: [name]      Created: Wed Jun 01, 2011  9:21 PM       Results reviewed.  Please inform pt of result.      Labs suggest possible dehydration.      Hold lasix for 2 days and return for BMET      Increase PO hydration

## 2011-06-07 ENCOUNTER — Other Ambulatory Visit (INDEPENDENT_AMBULATORY_CARE_PROVIDER_SITE_OTHER): Payer: Medicare Other | Admitting: *Deleted

## 2011-06-07 DIAGNOSIS — N289 Disorder of kidney and ureter, unspecified: Secondary | ICD-10-CM

## 2011-06-07 LAB — BASIC METABOLIC PANEL
BUN: 30 mg/dL — ABNORMAL HIGH (ref 6–23)
CO2: 26 mEq/L (ref 19–32)
Chloride: 103 mEq/L (ref 96–112)
Glucose, Bld: 162 mg/dL — ABNORMAL HIGH (ref 70–99)
Potassium: 4.5 mEq/L (ref 3.5–5.1)
Sodium: 138 mEq/L (ref 135–145)

## 2011-06-28 ENCOUNTER — Other Ambulatory Visit: Payer: Self-pay | Admitting: Internal Medicine

## 2011-06-28 MED ORDER — CLOPIDOGREL BISULFATE 75 MG PO TABS: 75.0000 mg | ORAL_TABLET | Freq: Every day | ORAL | Status: DC

## 2011-06-28 MED ORDER — AMIODARONE HCL 200 MG PO TABS: 200.0000 mg | ORAL_TABLET | Freq: Every day | ORAL | Status: DC

## 2011-07-28 ENCOUNTER — Other Ambulatory Visit: Payer: Self-pay

## 2011-07-28 ENCOUNTER — Emergency Department (HOSPITAL_BASED_OUTPATIENT_CLINIC_OR_DEPARTMENT_OTHER)
Admission: EM | Admit: 2011-07-28 | Discharge: 2011-07-28 | Disposition: A | Discharge status: Home / Self Care | Payer: Medicare Other | Attending: Emergency Medicine | Admitting: Emergency Medicine

## 2011-07-28 ENCOUNTER — Emergency Department (INDEPENDENT_AMBULATORY_CARE_PROVIDER_SITE_OTHER): Payer: Medicare Other

## 2011-07-28 ENCOUNTER — Encounter (HOSPITAL_BASED_OUTPATIENT_CLINIC_OR_DEPARTMENT_OTHER): Payer: Self-pay | Admitting: *Deleted

## 2011-07-28 DIAGNOSIS — R05 Cough: Secondary | ICD-10-CM | POA: Insufficient documentation

## 2011-07-28 DIAGNOSIS — K219 Gastro-esophageal reflux disease without esophagitis: Secondary | ICD-10-CM | POA: Insufficient documentation

## 2011-07-28 DIAGNOSIS — R059 Cough, unspecified: Secondary | ICD-10-CM | POA: Insufficient documentation

## 2011-07-28 DIAGNOSIS — R509 Fever, unspecified: Secondary | ICD-10-CM

## 2011-07-28 DIAGNOSIS — J40 Bronchitis, not specified as acute or chronic: Secondary | ICD-10-CM | POA: Insufficient documentation

## 2011-07-28 DIAGNOSIS — R42 Dizziness and giddiness: Secondary | ICD-10-CM

## 2011-07-28 DIAGNOSIS — I2510 Atherosclerotic heart disease of native coronary artery without angina pectoris: Secondary | ICD-10-CM | POA: Insufficient documentation

## 2011-07-28 DIAGNOSIS — E119 Type 2 diabetes mellitus without complications: Secondary | ICD-10-CM | POA: Insufficient documentation

## 2011-07-28 LAB — URINALYSIS, ROUTINE W REFLEX MICROSCOPIC
Leukocytes, UA: NEGATIVE
Nitrite: NEGATIVE
Specific Gravity, Urine: 1.022 (ref 1.005–1.030)
Urobilinogen, UA: 0.2 mg/dL (ref 0.0–1.0)

## 2011-07-28 LAB — COMPREHENSIVE METABOLIC PANEL
ALT: 25 U/L (ref 0–53)
Calcium: 9.1 mg/dL (ref 8.4–10.5)
GFR calc Af Amer: 35 mL/min — ABNORMAL LOW (ref >90)
Glucose, Bld: 155 mg/dL — ABNORMAL HIGH (ref 70–99)
Sodium: 132 mEq/L — ABNORMAL LOW (ref 135–145)
Total Protein: 7.8 g/dL (ref 6.0–8.3)

## 2011-07-28 LAB — DIFFERENTIAL
Eosinophils Absolute: 0 10*3/uL (ref 0.0–0.7)
Monocytes Absolute: 0.8 10*3/uL (ref 0.1–1.0)
Neutrophils Relative %: 79 % — ABNORMAL HIGH (ref 43–77)

## 2011-07-28 LAB — CBC
MCH: 32 pg (ref 26.0–34.0)
MCHC: 35.1 g/dL (ref 30.0–36.0)
Platelets: 116 10*3/uL — ABNORMAL LOW (ref 150–400)
RDW: 12.9 % (ref 11.5–15.5)

## 2011-07-28 LAB — URINE MICROSCOPIC-ADD ON

## 2011-07-28 MED ORDER — DOXYCYCLINE HYCLATE 100 MG PO TABS
100.0000 mg | ORAL_TABLET | Freq: Once | ORAL | Status: AC
  Administered 2011-07-28: 100 mg via ORAL
  Filled 2011-07-28: qty 1

## 2011-07-28 MED ORDER — ALBUTEROL SULFATE (5 MG/ML) 0.5% IN NEBU: 2.5000 mg | INHALATION_SOLUTION | Freq: Once | RESPIRATORY_TRACT | Status: DC

## 2011-07-28 MED ORDER — MECLIZINE HCL 25 MG PO TABS
25.0000 mg | ORAL_TABLET | Freq: Once | ORAL | Status: AC
  Administered 2011-07-28: 25 mg via ORAL
  Filled 2011-07-28: qty 1

## 2011-07-28 MED ORDER — ONDANSETRON HCL 4 MG/2ML IJ SOLN
4.0000 mg | Freq: Once | INTRAMUSCULAR | Status: AC
  Administered 2011-07-28: 4 mg via INTRAVENOUS
  Filled 2011-07-28: qty 2

## 2011-07-28 MED ORDER — DOXYCYCLINE HYCLATE 100 MG PO CAPS: 100.0000 mg | ORAL_CAPSULE | Freq: Two times a day (BID) | ORAL | Status: AC

## 2011-07-28 MED ORDER — ONDANSETRON HCL 4 MG PO TABS: 4.0000 mg | ORAL_TABLET | Freq: Four times a day (QID) | ORAL | Status: AC

## 2011-07-28 MED ORDER — SODIUM CHLORIDE 0.45 % IV BOLUS
500.0000 mL | Freq: Once | INTRAVENOUS | Status: AC
  Administered 2011-07-28: 500 mL via INTRAVENOUS

## 2011-07-28 MED ORDER — ALBUTEROL SULFATE (5 MG/ML) 0.5% IN NEBU
INHALATION_SOLUTION | RESPIRATORY_TRACT | Status: AC
  Filled 2011-07-28: qty 1

## 2011-07-28 MED ORDER — ALBUTEROL SULFATE (5 MG/ML) 0.5% IN NEBU
5.0000 mg | INHALATION_SOLUTION | Freq: Once | RESPIRATORY_TRACT | Status: AC
  Administered 2011-07-28: 5 mg via RESPIRATORY_TRACT

## 2011-07-28 NOTE — ED Notes (Signed)
Pt c/o cough, nausea, dizzy x 3 days

## 2011-07-28 NOTE — ED Notes (Signed)
Pt states that after the first [facility] tx he feels a lot better with his breathing.

## 2011-07-28 NOTE — ED Provider Notes (Signed)
History     CSN: [id number] Arrival date & time: 07/28/2011  7:23 PM   First MD Initiated Contact with Patient 07/28/11 1927      Chief Complaint  Patient presents with  . Cough  . Fever  . Dizziness    (Consider location/radiation/quality/duration/timing/severity/associated sxs/prior treatment) HPI Comments: PT with cough, decreased appetitie since yesterday.  Also has had significant dizziness since yesterday.  Is worse when standing and moving head.  Denies spinning sensation, but has been very nauseated since yesterday.  No headache.  No neuro deficits.  Has had cough with URI symptoms.  No vomiting.  No leg pain or swelling.  Patient is a 77 y.o. male presenting with cough.  Cough The current episode started yesterday. The problem occurs constantly. The problem has been gradually worsening. The cough is non-productive. Maximum temperature: subjective fever. The fever has been present for 1 to 2 days. Associated symptoms include chest pain, rhinorrhea, shortness of breath and wheezing. Pertinent negatives include no chills, no sweats, no headaches and no sore throat. Associated symptoms comments: Sore across chest with coughing. He is not a smoker.    Past Medical History  Diagnosis Date  . Coronary artery disease   . Arrhythmia   . Diabetes mellitus   . GERD (gastroesophageal reflux disease)   . Hyperlipidemia   . Chronic kidney disease     Past Surgical History  Procedure Date  . Insert / replace / remove pacemaker     History reviewed. No pertinent family history.  History  Substance Use Topics  . Smoking status: Never Smoker   . Smokeless tobacco: Not on file  . Alcohol Use: No      Review of Systems  Constitutional: Positive for fever, activity change, appetite change and fatigue. Negative for chills and diaphoresis.  HENT: Positive for congestion and rhinorrhea. Negative for sore throat and sneezing.   Eyes: Negative.   Respiratory: Positive for cough,  shortness of breath and wheezing. Negative for chest tightness.   Cardiovascular: Positive for chest pain. Negative for leg swelling.  Gastrointestinal: Positive for nausea. Negative for vomiting, abdominal pain, diarrhea and blood in stool.  Genitourinary: Negative for frequency, hematuria, flank pain and difficulty urinating.  Musculoskeletal: Negative for back pain and arthralgias.  Skin: Negative for rash.  Neurological: Positive for dizziness and light-headedness. Negative for speech difficulty, weakness, numbness and headaches.    Allergies  Penicillins and Sulfonamide derivatives  Home Medications   Current Outpatient Rx  Name Route Sig Dispense Refill  . ACETAMINOPHEN 500 MG PO TABS Oral Take 500 mg by mouth every 6 (six) hours as needed. For pain     . AMIODARONE HCL 200 MG PO TABS Oral Take 1 tablet (200 mg total) by mouth daily. 30 tablet 6  . ASPIRIN 81 MG PO TABS Oral Take 81 mg by mouth daily.      Marland Kitchen CLOPIDOGREL BISULFATE 75 MG PO TABS Oral Take 1 tablet (75 mg total) by mouth daily. 30 tablet 6  . FAMOTIDINE 40 MG PO TABS Oral Take 1 tablet (40 mg total) by mouth daily. 30 tablet 4    Further refills by [name]  . FENOFIBRATE 145 MG PO TABS Oral Take 145 mg by mouth daily.      . OMEGA-3 FATTY ACIDS 1000 MG PO CAPS Oral Take 1 g by mouth 2 (two) times daily.      . FUROSEMIDE 40 MG PO TABS Oral Take 1 tablet (40 mg total) by  mouth daily. 30 tablet 3    Dr. Deborah Chalk has retired. Further refills will be b ...  . GUAIFENESIN 600 MG PO TB12 Oral Take 600 mg by mouth 2 (two) times daily. For congestion      . ONE-DAILY MULTI VITAMINS PO TABS Oral Take 1 tablet by mouth daily.      Marland Kitchen ROSUVASTATIN CALCIUM 20 MG PO TABS Oral Take 1 tablet (20 mg total) by mouth at bedtime. 30 tablet 5  . DOXYCYCLINE HYCLATE 100 MG PO CAPS Oral Take 1 capsule (100 mg total) by mouth 2 (two) times daily. 20 capsule 0  . NITROGLYCERIN 0.4 MG SL SUBL Sublingual Place 0.4 mg under the tongue  every 5 (five) minutes as needed. For chest pain     . ONDANSETRON HCL 4 MG PO TABS Oral Take 1 tablet (4 mg total) by mouth every 6 (six) hours. 12 tablet 0    BP 114/74  Pulse 75  Temp(Src) 98.6 F (37 C) (Oral)  Resp 21  Ht 5\' 7"  (1.702 m)  Wt 219 lb (99.338 kg)  BMI 34.30 kg/m2  SpO2 95%  Physical Exam  Constitutional: He is oriented to person, place, and time. He appears well-developed and well-nourished.  HENT:  Head: Normocephalic and atraumatic.  Eyes: Pupils are equal, round, and reactive to light.       No nystagmus  Neck: Normal range of motion. Neck supple.  Cardiovascular: Normal rate, regular rhythm and normal heart sounds.   Pulmonary/Chest: Effort normal. No respiratory distress. He has wheezes. He has no rales. He exhibits no tenderness.       Slight wheezing to bases  Abdominal: Soft. Bowel sounds are normal. There is no tenderness. There is no rebound and no guarding.  Musculoskeletal: Normal range of motion. He exhibits edema.       Mild pitting edema bilaterally  Lymphadenopathy:    He has no cervical adenopathy.  Neurological: He is alert and oriented to person, place, and time.  Skin: Skin is warm and dry. No rash noted.  Psychiatric: He has a normal mood and affect.    ED Course  Procedures (including critical care time) Results for orders placed during the hospital encounter of 07/28/11  CBC      Component Value Range   WBC 6.3  4.0 - 10.5 (K/uL)   RBC 5.00  4.22 - 5.81 (MIL/uL)   Hemoglobin 16.0  13.0 - 17.0 (g/dL)   HCT 78.2  95.6 - 21.3 (%)   MCV 91.2  78.0 - 100.0 (fL)   MCH 32.0  26.0 - 34.0 (pg)   MCHC 35.1  30.0 - 36.0 (g/dL)   RDW 08.6  57.8 - 46.9 (%)   Platelets 116 (*) 150 - 400 (K/uL)  DIFFERENTIAL      Component Value Range   Neutrophils Relative 79 (*) 43 - 77 (%)   Lymphocytes Relative 9 (*) 12 - 46 (%)   Monocytes Relative 12  3 - 12 (%)   Eosinophils Relative 0  0 - 5 (%)   Basophils Relative 0  0 - 1 (%)   Neutro Abs  4.9  1.7 - 7.7 (K/uL)   Lymphs Abs 0.6 (*) 0.7 - 4.0 (K/uL)   Monocytes Absolute 0.8  0.1 - 1.0 (K/uL)   Eosinophils Absolute 0.0  0.0 - 0.7 (K/uL)   Basophils Absolute 0.0  0.0 - 0.1 (K/uL)   Smear Review LARGE PLATELETS PRESENT    COMPREHENSIVE METABOLIC PANEL  Component Value Range   Sodium 132 (*) 135 - 145 (mEq/L)   Potassium 3.5  3.5 - 5.1 (mEq/L)   Chloride 92 (*) 96 - 112 (mEq/L)   CO2 27  19 - 32 (mEq/L)   Glucose, Bld 155 (*) 70 - 99 (mg/dL)   BUN 30 (*) 6 - 23 (mg/dL)   Creatinine, Ser 5.62 (*) 0.50 - 1.35 (mg/dL)   Calcium 9.1  8.4 - 13.0 (mg/dL)   Total Protein 7.8  6.0 - 8.3 (g/dL)   Albumin 4.1  3.5 - 5.2 (g/dL)   AST 44 (*) 0 - 37 (U/L)   ALT 25  0 - 53 (U/L)   Alkaline Phosphatase 65  39 - 117 (U/L)   Total Bilirubin 0.7  0.3 - 1.2 (mg/dL)   GFR calc non Af Amer 30 (*) >90 (mL/min)   GFR calc Af Amer 35 (*) >90 (mL/min)  TROPONIN I      Component Value Range   Troponin I <0.30  <0.30 (ng/mL)  URINALYSIS, ROUTINE W REFLEX MICROSCOPIC      Component Value Range   Color, Urine YELLOW  YELLOW    Appearance CLEAR  CLEAR    Specific Gravity, Urine 1.022  1.005 - 1.030    pH 5.0  5.0 - 8.0    Glucose, UA NEGATIVE  NEGATIVE (mg/dL)   Hgb urine dipstick SMALL (*) NEGATIVE    Bilirubin Urine NEGATIVE  NEGATIVE    Ketones, ur NEGATIVE  NEGATIVE (mg/dL)   Protein, ur 30 (*) NEGATIVE (mg/dL)   Urobilinogen, UA 0.2  0.0 - 1.0 (mg/dL)   Nitrite NEGATIVE  NEGATIVE    Leukocytes, UA NEGATIVE  NEGATIVE   URINE MICROSCOPIC-ADD ON      Component Value Range   Squamous Epithelial / LPF RARE  RARE    WBC, UA 0-2  <3 (WBC/hpf)   RBC / HPF 0-2  <3 (RBC/hpf)   Bacteria, UA FEW (*) RARE    Dg Chest 2 View  07/28/2011  *RADIOLOGY REPORT*  Clinical Data: 77 year old male with cough, fever, dizziness.  CHEST - 2 VIEW  Comparison: 02/03/2010 and earlier.  Findings: Right chest dual lead cardiac pacemaker.  Cardiac size and mediastinal contours are within normal limits.  No  pneumothorax, pulmonary edema or pleural effusion.  No acute pulmonary opacity.  Chronic bulky anterior osteophytes in the thoracic spine. No acute osseous abnormality identified.  IMPRESSION: No acute cardiopulmonary abnormality.  Original Report Authenticated By: Harley Hallmark, M.D.      Date: 07/28/2011  Rate: 65  Rhythm: normal sinus rhythm  QRS Axis: normal  Intervals: PR prolonged  ST/T Wave abnormalities: nonspecific ST changes  Conduction Disutrbances:nonspecific intraventricular conduction delay  Narrative Interpretation:   Old EKG Reviewed: changes noted, was paced rhythm    1. Bronchitis       MDM  Pt feeling much better after IVFs, meclizine, zofran.  Ambulates without problem.  No dizziness.  No vomiting.  Feel that this is likely bronchitis with component of vertigo/dehydration.  Nothing to suggest intracranial process.  No evidence of pneumonia/sepsis.  Reviewed records, creatinine at baseline        Rolan Bucco, MD 07/28/11 2226

## 2011-07-29 LAB — INFLUENZA PANEL BY PCR (TYPE A & B)
Influenza A By PCR: POSITIVE — AB
Influenza B By PCR: NEGATIVE

## 2011-08-09 ENCOUNTER — Other Ambulatory Visit: Payer: Self-pay | Admitting: Cardiology

## 2011-08-11 ENCOUNTER — Other Ambulatory Visit: Payer: Self-pay | Admitting: Internal Medicine

## 2011-09-01 ENCOUNTER — Other Ambulatory Visit: Payer: Self-pay | Admitting: Internal Medicine

## 2011-09-01 ENCOUNTER — Encounter: Payer: Self-pay | Admitting: Internal Medicine

## 2011-09-01 ENCOUNTER — Ambulatory Visit (INDEPENDENT_AMBULATORY_CARE_PROVIDER_SITE_OTHER): Payer: Medicare Other | Admitting: *Deleted

## 2011-09-01 DIAGNOSIS — I4891 Unspecified atrial fibrillation: Secondary | ICD-10-CM

## 2011-09-01 DIAGNOSIS — I498 Other specified cardiac arrhythmias: Secondary | ICD-10-CM

## 2011-09-02 LAB — REMOTE PACEMAKER DEVICE
ATRIAL PACING PM: 62.19
BAMS-0001: 170 {beats}/min

## 2011-09-07 ENCOUNTER — Encounter: Payer: Self-pay | Admitting: *Deleted

## 2011-09-08 NOTE — Progress Notes (Signed)
Remote pacer check  

## 2011-09-19 ENCOUNTER — Other Ambulatory Visit: Payer: Self-pay

## 2011-09-19 MED ORDER — FENOFIBRATE 145 MG PO TABS: 145.0000 mg | ORAL_TABLET | Freq: Every day | ORAL | Status: DC

## 2011-09-28 ENCOUNTER — Encounter: Payer: Self-pay | Admitting: Internal Medicine

## 2011-11-07 ENCOUNTER — Other Ambulatory Visit: Payer: Self-pay

## 2011-11-07 MED ORDER — FUROSEMIDE 40 MG PO TABS: 40.0000 mg | ORAL_TABLET | Freq: Every day | ORAL | Status: DC

## 2011-11-07 MED ORDER — FAMOTIDINE 40 MG PO TABS: 40.0000 mg | ORAL_TABLET | Freq: Every day | ORAL | Status: DC

## 2011-11-14 ENCOUNTER — Encounter: Payer: Self-pay | Admitting: Internal Medicine

## 2011-12-19 ENCOUNTER — Encounter: Payer: Self-pay | Admitting: Internal Medicine

## 2011-12-19 ENCOUNTER — Ambulatory Visit (INDEPENDENT_AMBULATORY_CARE_PROVIDER_SITE_OTHER): Payer: MEDICARE | Admitting: Internal Medicine

## 2011-12-19 VITALS — BP 136/66 | HR 68 | Resp 18 | Ht 67.0 in | Wt 210.8 lb

## 2011-12-19 DIAGNOSIS — I4891 Unspecified atrial fibrillation: Secondary | ICD-10-CM

## 2011-12-19 DIAGNOSIS — I498 Other specified cardiac arrhythmias: Secondary | ICD-10-CM

## 2011-12-19 DIAGNOSIS — I10 Essential (primary) hypertension: Secondary | ICD-10-CM

## 2011-12-19 LAB — PACEMAKER DEVICE OBSERVATION
AL THRESHOLD: 4 V
RV LEAD AMPLITUDE: 7.8 mv
RV LEAD THRESHOLD: 1 V

## 2011-12-19 NOTE — Assessment & Plan Note (Signed)
Normal pacemaker function, though RA threshold is very high. Reprogrammed to increased RA pacing output today.  Also turned rate response on. See Arita Miss Art report No changes today

## 2011-12-19 NOTE — Assessment & Plan Note (Signed)
Stable No change required today  

## 2011-12-19 NOTE — Progress Notes (Signed)
The patient presents today for routine electrophysiology followup.  Since last being seen in our clinic, the patient reports doing reasonably well. He has chronic stable SOB.  He is followed by nephrology for worsening renal failure.  Today, he denies symptoms of palpitations, chest pain, orthopnea, PND, lower extremity edema, dizziness, presyncope, syncope, or neurologic sequela.  The patient feels that he is tolerating medications without difficulties and is otherwise without complaint today.   Past Medical History  Diagnosis Date  . Coronary artery disease   . Atrial fibrillation   . Diabetes mellitus   . GERD (gastroesophageal reflux disease)   . Hyperlipidemia   . Chronic kidney disease    Past Surgical History  Procedure Date  . Pacemaker insertion     Current Outpatient Prescriptions  Medication Sig Dispense Refill  . acetaminophen (TYLENOL) 500 MG tablet Take 500 mg by mouth every 6 (six) hours as needed. For pain       . amiodarone (PACERONE) 200 MG tablet Take 1 tablet (200 mg total) by mouth daily.  30 tablet  6  . aspirin 81 MG tablet Take 81 mg by mouth daily.        . clopidogrel (PLAVIX) 75 MG tablet Take 1 tablet (75 mg total) by mouth daily.  30 tablet  6  . CRESTOR 20 MG tablet take 1 tablet by mouth at bedtime  30 tablet  5  . famotidine (PEPCID) 40 MG tablet Take 1 tablet (40 mg total) by mouth daily.  30 tablet  4  . fenofibrate (TRICOR) 145 MG tablet Take 1 tablet (145 mg total) by mouth daily.  30 tablet  3  . fish oil-omega-3 fatty acids 1000 MG capsule Take 1 g by mouth 2 (two) times daily.        . furosemide (LASIX) 40 MG tablet Take 1 tablet (40 mg total) by mouth daily.  30 tablet  3  . guaiFENesin (MUCINEX) 600 MG 12 hr tablet Take 600 mg by mouth 2 (two) times daily. For congestion        . Multiple Vitamin (MULTIVITAMIN) tablet Take 1 tablet by mouth daily.        . nitroGLYCERIN (NITROSTAT) 0.4 MG SL tablet Place 0.4 mg under the tongue every 5 (five)  minutes as needed. For chest pain         Allergies  Allergen Reactions  . Penicillins   . Sulfonamide Derivatives     History   Social History  . Marital Status: Married    Spouse Name: N/A    Number of Children: N/A  . Years of Education: N/A   Occupational History  . Not on file.   Social History Main Topics  . Smoking status: Never Smoker   . Smokeless tobacco: Not on file  . Alcohol Use: No  . Drug Use: No  . Sexually Active: No   Other Topics Concern  . Not on file   Social History Narrative  . No narrative on file    Physical Exam: Filed Vitals:   12/19/11 1047  BP: 136/66  Pulse: 68  Resp: 18  Height: 5\' 7"  (1.702 m)  Weight: 210 lb 12.8 oz (95.618 kg)    GEN- The patient is well appearing, alert and oriented x 3 today.   Head- normocephalic, atraumatic Eyes-  Sclera clear, conjunctiva pink Ears- hearing intact Oropharynx- clear Neck- supple, no JVP Lymph- no cervical lymphadenopathy Lungs- Clear to ausculation bilaterally, normal work of breathing Chest- pacemaker pocket  is well healed Heart- Regular rate and rhythm, no murmurs, rubs or gallops, PMI not laterally displaced GI- soft, NT, ND, + BS Extremities- no clubbing, cyanosis, or edema  Pacemaker interrogation- reviewed in detail today,  See PACEART report ekg today reveals atrial pacing at 50 bpm, PR 230, poor R wave progression, LAD  Assessment and Plan:

## 2011-12-19 NOTE — Assessment & Plan Note (Signed)
Presently in sinus rhythm with amiodarone Check LFTs and TFTs upon return to see lori  Continue ASA and Plavix as per Dr Deborah Chalk.  HE declines coumadin.

## 2011-12-19 NOTE — Patient Instructions (Addendum)
Your physician recommends that you schedule a follow-up appointment in: 3 months with Lori Gerhardt, NP and 12 months with Dr Allred  

## 2012-01-31 ENCOUNTER — Other Ambulatory Visit: Payer: Self-pay | Admitting: *Deleted

## 2012-01-31 ENCOUNTER — Other Ambulatory Visit: Payer: Self-pay | Admitting: Internal Medicine

## 2012-01-31 MED ORDER — FENOFIBRATE 145 MG PO TABS: 145.0000 mg | ORAL_TABLET | Freq: Every day | ORAL | Status: DC

## 2012-02-23 ENCOUNTER — Other Ambulatory Visit: Payer: Self-pay | Admitting: Internal Medicine

## 2012-02-23 MED ORDER — ROSUVASTATIN CALCIUM 20 MG PO TABS: 20.0000 mg | ORAL_TABLET | Freq: Every day | ORAL | Status: DC

## 2012-03-20 ENCOUNTER — Ambulatory Visit (INDEPENDENT_AMBULATORY_CARE_PROVIDER_SITE_OTHER): Payer: MEDICARE | Admitting: Nurse Practitioner

## 2012-03-20 ENCOUNTER — Encounter: Payer: Self-pay | Admitting: Nurse Practitioner

## 2012-03-20 VITALS — BP 140/78 | HR 68 | Ht 67.0 in | Wt 213.0 lb

## 2012-03-20 DIAGNOSIS — I2510 Atherosclerotic heart disease of native coronary artery without angina pectoris: Secondary | ICD-10-CM

## 2012-03-20 DIAGNOSIS — Z79899 Other long term (current) drug therapy: Secondary | ICD-10-CM

## 2012-03-20 DIAGNOSIS — I4891 Unspecified atrial fibrillation: Secondary | ICD-10-CM

## 2012-03-20 DIAGNOSIS — IMO0002 Reserved for concepts with insufficient information to code with codable children: Secondary | ICD-10-CM

## 2012-03-20 DIAGNOSIS — I4729 Other ventricular tachycardia: Secondary | ICD-10-CM

## 2012-03-20 DIAGNOSIS — I10 Essential (primary) hypertension: Secondary | ICD-10-CM

## 2012-03-20 DIAGNOSIS — I472 Ventricular tachycardia, unspecified: Secondary | ICD-10-CM

## 2012-03-20 LAB — BASIC METABOLIC PANEL
BUN: 35 mg/dL — ABNORMAL HIGH (ref 6–23)
CO2: 25 mEq/L (ref 19–32)
Calcium: 9.2 mg/dL (ref 8.4–10.5)
Chloride: 106 mEq/L (ref 96–112)
Creatinine, Ser: 2.7 mg/dL — ABNORMAL HIGH (ref 0.4–1.5)
GFR: 24.33 mL/min — ABNORMAL LOW (ref >60.00)
Glucose, Bld: 140 mg/dL — ABNORMAL HIGH (ref 70–99)
Potassium: 4.3 mEq/L (ref 3.5–5.1)
Sodium: 140 mEq/L (ref 135–145)

## 2012-03-20 LAB — CBC WITH DIFFERENTIAL/PLATELET
Basophils Absolute: 0.1 10*3/uL (ref 0.0–0.1)
Basophils Relative: 1 % (ref 0.0–3.0)
Eosinophils Absolute: 0.3 10*3/uL (ref 0.0–0.7)
Eosinophils Relative: 5.3 % — ABNORMAL HIGH (ref 0.0–5.0)
HCT: 43.5 % (ref 39.0–52.0)
Hemoglobin: 14.6 g/dL (ref 13.0–17.0)
Lymphocytes Relative: 18.5 % (ref 12.0–46.0)
Lymphs Abs: 1.1 10*3/uL (ref 0.7–4.0)
MCHC: 33.7 g/dL (ref 30.0–36.0)
MCV: 95.8 fl (ref 78.0–100.0)
Monocytes Absolute: 0.6 10*3/uL (ref 0.1–1.0)
Monocytes Relative: 9.8 % (ref 3.0–12.0)
Neutro Abs: 4 10*3/uL (ref 1.4–7.7)
Neutrophils Relative %: 65.4 % (ref 43.0–77.0)
Platelets: 140 10*3/uL — ABNORMAL LOW (ref 150.0–400.0)
RBC: 4.54 Mil/uL (ref 4.22–5.81)
RDW: 13.6 % (ref 11.5–14.6)
WBC: 6.1 10*3/uL (ref 4.5–10.5)

## 2012-03-20 LAB — HEPATIC FUNCTION PANEL
ALT: 18 U/L (ref 0–53)
AST: 23 U/L (ref 0–37)
Albumin: 3.8 g/dL (ref 3.5–5.2)
Alkaline Phosphatase: 57 U/L (ref 39–117)
Bilirubin, Direct: 0.1 mg/dL (ref 0.0–0.3)
Total Bilirubin: 0.5 mg/dL (ref 0.3–1.2)
Total Protein: 7.1 g/dL (ref 6.0–8.3)

## 2012-03-20 LAB — TSH: TSH: 2.61 u[IU]/mL (ref 0.35–5.50)

## 2012-03-20 NOTE — Assessment & Plan Note (Signed)
No recurrent chest pain. He is primarily limited by his back pain and I encouraged him to stay active with his walking. We will see him back in about 4 months.

## 2012-03-20 NOTE — Assessment & Plan Note (Signed)
Blood pressure is ok. No change in his current regimen. 

## 2012-03-20 NOTE — Progress Notes (Signed)
Kent Nolan Date of Birth: [birth year] Medical Record #[id number]  History of Present Illness: [name] is seen today for his 3 month check. He is seen for [name]. He is a former patient of [name]. He has known ischemic heart disease with prior subendocardial MI in February of 2005 with stents to the RCA and LCX. He experienced acute thrombosis of both stents in February of 2010 that was associated with cardiogenic shock and had repeat stenting to both lesions at that time with non drug eluting stents placed. Follow up cath in September of 2010 was satisfactory. He is on chronic Plavix. Other problems include DM, HTN, GERD, ACE related cough, HLD, PAF and VT back in 2010 and on chronic amiodarone therapy, CRI and remote UTIs. He does have an underlying pacemaker in place since 2011 and has had prior cardioversion for his atrial fib back in August of 2011. While he does have an elevated CHADS score, he has been maintained on chronic Plavix and aspirin therapy in light of his stent thrombosis. Last visit here was in April and he was felt to be doing well. Remains in sinus rhythm. Sees nephrology for his kidneys. Does need follow up labs today for his amiodarone.   He comes in today. He is here with his wife. He is doing pretty well. Has chronic dyspnea. He is limited by chronic back pain. No chest pain. Not really using sunscreen and his arms are getting discolored. Not dizzy anymore. He is trying to stay active and will go walk at the mall. He does have to stop frequently to rest. No falls. Continues to see [name] for his kidneys. Tolerating his medicines. Overall, he and his wife are pleased with how he is doing. He does report having his lipids checked by his PCP earlier this year.  Current Outpatient Prescriptions on File Prior to Visit  Medication Sig Dispense Refill  . acetaminophen (TYLENOL) 500 MG tablet Take 500 mg by mouth every 6 (six) hours as needed. For pain       .  amiodarone (PACERONE) 200 MG tablet take 1 tablet by mouth once daily  30 tablet  6  . aspirin 81 MG tablet Take 81 mg by mouth daily.        . clopidogrel (PLAVIX) 75 MG tablet take 1 tablet by mouth once daily  30 tablet  6  . famotidine (PEPCID) 40 MG tablet Take 1 tablet (40 mg total) by mouth daily.  30 tablet  4  . fenofibrate (TRICOR) 145 MG tablet Take 1 tablet (145 mg total) by mouth daily.  30 tablet  3  . fish oil-omega-3 fatty acids 1000 MG capsule Take 1 g by mouth 2 (two) times daily.        . furosemide (LASIX) 40 MG tablet Take 1 tablet (40 mg total) by mouth daily.  30 tablet  3  . Multiple Vitamin (MULTIVITAMIN) tablet Take 1 tablet by mouth daily.        . nitroGLYCERIN (NITROSTAT) 0.4 MG SL tablet Place 0.4 mg under the tongue every 5 (five) minutes as needed. For chest pain       . rosuvastatin (CRESTOR) 20 MG tablet Take 1 tablet (20 mg total) by mouth daily.  30 tablet  5  . guaiFENesin (MUCINEX) 600 MG 12 hr tablet Take 600 mg by mouth 2 (two) times daily. For congestion          Allergies  Allergen Reactions  .  Penicillins   . Sulfonamide Derivatives     Past Medical History  Diagnosis Date  . Coronary artery disease     remote subendo MI in February 2005 with stents to RCA &LCX; s/p scute thrombosis of both stents in February 2010 with cardiogenic shock - s/p BMS to both lesions; follow up cath in September 2010 was satisfactory.  He is medically managed with aspirin/Plavix  . PAF (paroxysmal atrial fibrillation)     s/p cardioversion in August 2011  . Diabetes mellitus   . GERD (gastroesophageal reflux disease)   . Hyperlipidemia   . Chronic kidney disease     followed by nephrology  . Pacemaker   . HTN (hypertension)   . Obesity   . High risk medication use     on amiodarone for PAF/VT since February 2010    Past Surgical History  Procedure Date  . Pacemaker insertion May 2011    History  Smoking status  . Never Smoker   Smokeless tobacco  .  Not on file    History  Alcohol Use No    Family History  Problem Relation Age of Onset  . Heart disease Mother     Review of Systems: The review of systems is per the HPI.  All other systems were reviewed and are negative.  Physical Exam: BP 140/78  Pulse 68  Ht 5\' 7"  (1.702 m)  Wt 213 lb (96.616 kg)  BMI 33.36 kg/m2 Patient is very pleasant and in no acute distress. He is obese. Weight is up 3 pounds since last visit. Skin is warm and dry. Color is normal.  Arms are somewhat discolored from his amiodarone. HEENT is unremarkable. Normocephalic/atraumatic. PERRL. Sclera are nonicteric. Neck is supple. No masses. No JVD. Lungs are clear. Cardiac exam shows a regular rate and rhythm. Abdomen is obese but soft. Extremities are without edema. Gait and ROM are intact. No gross neurologic deficits noted.   LABORATORY DATA: PENDING  Lab Results  Component Value Date   WBC 6.3 07/28/2011   HGB 16.0 07/28/2011   HCT 45.6 07/28/2011   PLT 116* 07/28/2011   GLUCOSE 155* 07/28/2011   CHOL  Value: 85        ATP III CLASSIFICATION:  <200     mg/dL   Desirable  960-454  mg/dL   Borderline High  >=098    mg/dL   High        09/23/1476   TRIG 51 05/05/2009   HDL 39* 05/05/2009   LDLCALC  Value: 36        Total Cholesterol/HDL:CHD Risk Coronary Heart Disease Risk Table                     Men   Women  1/2 Average Risk   3.4   3.3  Average Risk       5.0   4.4  2 X Average Risk   9.6   7.1  3 X Average Risk  23.4   11.0        Use the calculated Patient Ratio above and the CHD Risk Table to determine the patient's CHD Risk.        ATP III CLASSIFICATION (LDL):  <100     mg/dL   Optimal  295-621  mg/dL   Near or Above                    Optimal  130-159  mg/dL   Borderline  160-189  mg/dL   High  >161     mg/dL   Very High 0/96/0454   ALT 25 07/28/2011   AST 44* 07/28/2011   NA 132* 07/28/2011   K 3.5 07/28/2011   CL 92* 07/28/2011   CREATININE 2.00* 07/28/2011   BUN 30* 07/28/2011   CO2 27  07/28/2011   TSH 2.35 06/01/2011   INR 2.81* 04/20/2010   HGBA1C  Value: 6.6 (NOTE) The ADA recommends the following therapeutic goal for glycemic control related to Hgb A1c measurement: Goal of therapy: <6.5 Hgb A1c  Reference: American Diabetes Association: Clinical Practice Recommendations 2010, Diabetes Care, 2010, 33: (Suppl  1).* 05/04/2009     Assessment / Plan:

## 2012-03-20 NOTE — Assessment & Plan Note (Signed)
He remains on his amiodarone. Will check follow up labs today. He is to transmit his pacemaker later this week. Overall, he is doing well. Sunscreen use is strongly encouraged. Will see him back in 4 months. Patient is agreeable to this plan and will call if any problems develop in the interim.

## 2012-03-20 NOTE — Patient Instructions (Addendum)
Use your sunscreen  Transmit your pacemaker on Thursday  We need to check some labs today to follow up your amiodarone  Stay on your medicines  We will see you back in 4 months  Call the [facility] office at [phone] if you have any questions, problems or concerns.

## 2012-03-22 ENCOUNTER — Ambulatory Visit (INDEPENDENT_AMBULATORY_CARE_PROVIDER_SITE_OTHER): Payer: Medicare Other | Admitting: *Deleted

## 2012-03-22 ENCOUNTER — Encounter: Payer: Self-pay | Admitting: Internal Medicine

## 2012-03-22 DIAGNOSIS — I498 Other specified cardiac arrhythmias: Secondary | ICD-10-CM

## 2012-04-02 LAB — REMOTE PACEMAKER DEVICE
AL AMPLITUDE: 2.2 mv
AL IMPEDENCE PM: 384 Ohm
BATTERY VOLTAGE: 2.97 V
RV LEAD AMPLITUDE: 6 mv

## 2012-04-09 ENCOUNTER — Telehealth: Payer: Self-pay | Admitting: Internal Medicine

## 2012-04-09 ENCOUNTER — Encounter: Payer: Self-pay | Admitting: *Deleted

## 2012-04-09 NOTE — Telephone Encounter (Signed)
They are getting a air tube and they want to make sure it wont cause him any trouble with his pacer

## 2012-04-09 NOTE — Telephone Encounter (Signed)
He is getting a new tub and it has jets in wanted to make sure it would be ok  Reassured her it would be fine

## 2012-04-15 ENCOUNTER — Other Ambulatory Visit: Payer: Self-pay | Admitting: Internal Medicine

## 2012-05-11 ENCOUNTER — Encounter: Payer: Self-pay | Admitting: Cardiology

## 2012-05-14 ENCOUNTER — Encounter: Payer: Self-pay | Admitting: Cardiology

## 2012-06-04 ENCOUNTER — Other Ambulatory Visit: Payer: Self-pay

## 2012-06-04 MED ORDER — FENOFIBRATE 145 MG PO TABS: 145.0000 mg | ORAL_TABLET | Freq: Every day | ORAL | Status: DC

## 2012-06-25 ENCOUNTER — Other Ambulatory Visit: Payer: Self-pay | Admitting: Internal Medicine

## 2012-06-25 MED ORDER — FUROSEMIDE 40 MG PO TABS: 40.0000 mg | ORAL_TABLET | Freq: Every day | ORAL | Status: DC

## 2012-07-02 ENCOUNTER — Ambulatory Visit (INDEPENDENT_AMBULATORY_CARE_PROVIDER_SITE_OTHER): Payer: Medicare Other | Admitting: *Deleted

## 2012-07-02 DIAGNOSIS — I498 Other specified cardiac arrhythmias: Secondary | ICD-10-CM

## 2012-07-02 DIAGNOSIS — I4891 Unspecified atrial fibrillation: Secondary | ICD-10-CM

## 2012-07-10 LAB — REMOTE PACEMAKER DEVICE
AL IMPEDENCE PM: 400 Ohm
ATRIAL PACING PM: 33.24
RV LEAD IMPEDENCE PM: 328 Ohm
VENTRICULAR PACING PM: 21.85

## 2012-07-23 ENCOUNTER — Encounter: Payer: Self-pay | Admitting: Nurse Practitioner

## 2012-07-23 ENCOUNTER — Ambulatory Visit (INDEPENDENT_AMBULATORY_CARE_PROVIDER_SITE_OTHER): Payer: Medicare Other | Admitting: Nurse Practitioner

## 2012-07-23 VITALS — BP 118/74 | HR 60 | Ht 67.0 in | Wt 213.8 lb

## 2012-07-23 DIAGNOSIS — Z79899 Other long term (current) drug therapy: Secondary | ICD-10-CM

## 2012-07-23 DIAGNOSIS — I259 Chronic ischemic heart disease, unspecified: Secondary | ICD-10-CM

## 2012-07-23 LAB — LIPID PANEL
Cholesterol: 109 mg/dL (ref 0–200)
HDL: 34.6 mg/dL — ABNORMAL LOW (ref >39.00)
Total CHOL/HDL Ratio: 3
Triglycerides: 259 mg/dL — ABNORMAL HIGH (ref 0.0–149.0)
VLDL: 51.8 mg/dL — ABNORMAL HIGH (ref 0.0–40.0)

## 2012-07-23 LAB — HEPATIC FUNCTION PANEL
ALT: 25 U/L (ref 0–53)
AST: 27 U/L (ref 0–37)
Albumin: 4.1 g/dL (ref 3.5–5.2)
Alkaline Phosphatase: 56 U/L (ref 39–117)
Bilirubin, Direct: 0.2 mg/dL (ref 0.0–0.3)
Total Bilirubin: 0.7 mg/dL (ref 0.3–1.2)
Total Protein: 7.4 g/dL (ref 6.0–8.3)

## 2012-07-23 LAB — BASIC METABOLIC PANEL
BUN: 35 mg/dL — ABNORMAL HIGH (ref 6–23)
CO2: 29 mEq/L (ref 19–32)
Calcium: 9.1 mg/dL (ref 8.4–10.5)
Chloride: 102 mEq/L (ref 96–112)
Creatinine, Ser: 2.4 mg/dL — ABNORMAL HIGH (ref 0.4–1.5)
GFR: 27.73 mL/min — ABNORMAL LOW (ref >60.00)
Glucose, Bld: 222 mg/dL — ABNORMAL HIGH (ref 70–99)
Potassium: 4.3 mEq/L (ref 3.5–5.1)
Sodium: 138 mEq/L (ref 135–145)

## 2012-07-23 LAB — CBC WITH DIFFERENTIAL/PLATELET
Basophils Absolute: 0.1 10*3/uL (ref 0.0–0.1)
Basophils Relative: 1 % (ref 0.0–3.0)
Eosinophils Absolute: 0.3 10*3/uL (ref 0.0–0.7)
Eosinophils Relative: 4.9 % (ref 0.0–5.0)
HCT: 45.9 % (ref 39.0–52.0)
Hemoglobin: 15.2 g/dL (ref 13.0–17.0)
Lymphocytes Relative: 18.7 % (ref 12.0–46.0)
Lymphs Abs: 1.2 10*3/uL (ref 0.7–4.0)
MCHC: 33.1 g/dL (ref 30.0–36.0)
MCV: 96.8 fl (ref 78.0–100.0)
Monocytes Absolute: 0.5 10*3/uL (ref 0.1–1.0)
Monocytes Relative: 7.9 % (ref 3.0–12.0)
Neutro Abs: 4.3 10*3/uL (ref 1.4–7.7)
Neutrophils Relative %: 67.5 % (ref 43.0–77.0)
Platelets: 145 10*3/uL — ABNORMAL LOW (ref 150.0–400.0)
RBC: 4.75 Mil/uL (ref 4.22–5.81)
RDW: 12.6 % (ref 11.5–14.6)
WBC: 6.4 10*3/uL (ref 4.5–10.5)

## 2012-07-23 LAB — LDL CHOLESTEROL, DIRECT: Direct LDL: 45.4 mg/dL

## 2012-07-23 LAB — TSH: TSH: 2.52 u[IU]/mL (ref 0.35–5.50)

## 2012-07-23 NOTE — Patient Instructions (Addendum)
Try to be as active as possible  We will get you an appointment with Dickie La, the audiologist here in [place] for a hearing test  We need to check labs today  I will see you in 4 months  Stay on your current medicines  Call the [facility] office at [phone] if you have any questions, problems or concerns.

## 2012-07-23 NOTE — Progress Notes (Signed)
Kent Nolan Date of Birth: Jan 24, 1929 Medical Record #[id number]  History of Present Illness: Kent Nolan is seen today for his 4 month check. He is seen for [name]. He has known ischemic heart disease with prior subendocardial MI in February of 2005 with stents to the RCA and LCX. He experienced acute thrombosis of both stents in February of 2010 that was associated with cardiogenic shock and had repeat stenting to both lesions at that time with non drug eluting stents placed. Follow up cath in September of 2010 was satisfactory. He is on chronic Plavix. Other problems include DM, HTN, GERD, ACE related cough, HLD, PAF and VT back in 2010 and on chronic amiodarone therapy, CRI and remote UTIs. He does have an underlying pacemaker in place since 2011 and has had prior cardioversion for his atrial fib back in August of 2011. While he does have an elevated CHADS score, he has been maintained on chronic Plavix and aspirin therapy in light of his stent thrombosis.   He comes in today. He is here with his wife. He is doing well. He remains pretty sedentary. Balance is an issue. No falls. No chest pain. His breathing seems to be at his baseline. Did recent pacemaker check remotely. Not dizzy. Has some arthritis that limits him a little. His wife is more concerned about his hearing. He has been a little resistant to getting a hearing test.    Current Outpatient Prescriptions on File Prior to Visit  Medication Sig Dispense Refill  . acetaminophen (TYLENOL) 500 MG tablet Take 500 mg by mouth every 6 (six) hours as needed. For pain       . amiodarone (PACERONE) 200 MG tablet take 1 tablet by mouth once daily  30 tablet  6  . aspirin 81 MG tablet Take 81 mg by mouth daily.        . clopidogrel (PLAVIX) 75 MG tablet take 1 tablet by mouth once daily  30 tablet  6  . famotidine (PEPCID) 40 MG tablet take 1 tablet by mouth once daily  30 tablet  4  . fenofibrate (TRICOR) 145 MG tablet Take 1 tablet (145  mg total) by mouth daily.  30 tablet  3  . furosemide (LASIX) 40 MG tablet Take 1 tablet (40 mg total) by mouth daily.  30 tablet  3  . Multiple Vitamin (MULTIVITAMIN) tablet Take 1 tablet by mouth daily.        . nitroGLYCERIN (NITROSTAT) 0.4 MG SL tablet Place 0.4 mg under the tongue every 5 (five) minutes as needed. For chest pain       . rosuvastatin (CRESTOR) 20 MG tablet Take 1 tablet (20 mg total) by mouth daily.  30 tablet  5    Allergies  Allergen Reactions  . Penicillins   . Sulfonamide Derivatives     Past Medical History  Diagnosis Date  . Coronary artery disease     remote subendo MI in February 2005 with stents to RCA &LCX; s/p scute thrombosis of both stents in February 2010 with cardiogenic shock - s/p BMS to both lesions; follow up cath in September 2010 was satisfactory.  He is medically managed with aspirin/Plavix  . PAF (paroxysmal atrial fibrillation)     s/p cardioversion in August 2011  . Diabetes mellitus   . GERD (gastroesophageal reflux disease)   . Hyperlipidemia   . Chronic kidney disease     followed by nephrology  . Pacemaker   . HTN (hypertension)   .  Obesity   . High risk medication use     on amiodarone for PAF/VT since February 2010    Past Surgical History  Procedure Date  . Pacemaker insertion May 2011    History  Smoking status  . Never Smoker   Smokeless tobacco  . Not on file    History  Alcohol Use No    Family History  Problem Relation Age of Onset  . Heart disease Mother     Review of Systems: The review of systems is per the HPI.  All other systems were reviewed and are negative.  Physical Exam: BP 118/74  Pulse 60  Ht 5\' 7"  (1.702 m)  Wt 213 lb 12.8 oz (96.979 kg)  BMI 33.49 kg/m2 Patient is very pleasant and in no acute distress. He remains obese. Weight is unchanged. He is hard of hearing. Skin is warm and dry. Color is normal.  HEENT is unremarkable. Normocephalic/atraumatic. PERRL. Sclera are nonicteric.  Neck is supple. No masses. No JVD. Lungs are clear. Cardiac exam shows a regular rate and rhythm. Abdomen is obese but soft. Extremities are without edema. Gait and ROM are intact. No gross neurologic deficits noted.   LABORATORY DATA: Pending.   Lab Results  Component Value Date   WBC 6.1 03/20/2012   HGB 14.6 03/20/2012   HCT 43.5 03/20/2012   PLT 140.0* 03/20/2012   GLUCOSE 140* 03/20/2012   CHOL  Value: 85        ATP III CLASSIFICATION:  <200     mg/dL   Desirable  161-096  mg/dL   Borderline High  >=045    mg/dL   High        12/12/8117   TRIG 51 05/05/2009   HDL 39* 05/05/2009   LDLCALC  Value: 36        Total Cholesterol/HDL:CHD Risk Coronary Heart Disease Risk Table                     Men   Women  1/2 Average Risk   3.4   3.3  Average Risk       5.0   4.4  2 X Average Risk   9.6   7.1  3 X Average Risk  23.4   11.0        Use the calculated Patient Ratio above and the CHD Risk Table to determine the patient's CHD Risk.        ATP III CLASSIFICATION (LDL):  <100     mg/dL   Optimal  147-829  mg/dL   Near or Above                    Optimal  130-159  mg/dL   Borderline  562-130  mg/dL   High  >865     mg/dL   Very High 7/84/6962   ALT 18 03/20/2012   AST 23 03/20/2012   NA 140 03/20/2012   K 4.3 03/20/2012   CL 106 03/20/2012   CREATININE 2.7* 03/20/2012   BUN 35* 03/20/2012   CO2 25 03/20/2012   TSH 2.61 03/20/2012   INR 2.81* 04/20/2010   HGBA1C  Value: 6.6 (NOTE) The ADA recommends the following therapeutic goal for glycemic control related to Hgb A1c measurement: Goal of therapy: <6.5 Hgb A1c  Reference: American Diabetes Association: Clinical Practice Recommendations 2010, Diabetes Care, 2010, 33: (Suppl  1).* 05/04/2009    Assessment / Plan: 1. CAD - no recurrent chest pain. Doing  well.   2. PAF - on amiodarone - needs follow up labs today.   3. Decreased hearing - he is agreeable to getting a hearing test.  4. PTVP - followed by [name].   5. HTN - blood pressure is doing ok.  I  encouraged him to be active. We will check labs today. Send for hearing test. I will see him back in about 4 months. Patient is agreeable to this plan and will call if any problems develop in the interim.

## 2012-07-31 ENCOUNTER — Encounter: Payer: Self-pay | Admitting: *Deleted

## 2012-08-06 ENCOUNTER — Encounter: Payer: Self-pay | Admitting: Internal Medicine

## 2012-08-31 ENCOUNTER — Encounter: Payer: Self-pay | Admitting: Cardiology

## 2012-09-06 ENCOUNTER — Other Ambulatory Visit: Payer: Self-pay | Admitting: *Deleted

## 2012-09-06 MED ORDER — AMIODARONE HCL 200 MG PO TABS: 200.0000 mg | ORAL_TABLET | Freq: Every day | ORAL | Status: DC

## 2012-09-06 MED ORDER — CLOPIDOGREL BISULFATE 75 MG PO TABS: 75.0000 mg | ORAL_TABLET | Freq: Every day | ORAL | Status: DC

## 2012-10-08 ENCOUNTER — Other Ambulatory Visit: Payer: Self-pay | Admitting: *Deleted

## 2012-10-08 ENCOUNTER — Encounter: Payer: Medicare Other | Admitting: *Deleted

## 2012-10-08 MED ORDER — FENOFIBRATE 145 MG PO TABS: 145.0000 mg | ORAL_TABLET | Freq: Every day | ORAL | Status: DC

## 2012-10-08 MED ORDER — FAMOTIDINE 40 MG PO TABS: 40.0000 mg | ORAL_TABLET | Freq: Every day | ORAL | Status: DC

## 2012-10-12 ENCOUNTER — Encounter: Payer: Self-pay | Admitting: *Deleted

## 2012-10-23 ENCOUNTER — Ambulatory Visit (INDEPENDENT_AMBULATORY_CARE_PROVIDER_SITE_OTHER): Payer: Medicare Other | Admitting: *Deleted

## 2012-10-23 ENCOUNTER — Other Ambulatory Visit: Payer: Self-pay | Admitting: Internal Medicine

## 2012-10-23 DIAGNOSIS — Z950 Presence of cardiac pacemaker: Secondary | ICD-10-CM

## 2012-10-23 DIAGNOSIS — I498 Other specified cardiac arrhythmias: Secondary | ICD-10-CM

## 2012-10-28 LAB — REMOTE PACEMAKER DEVICE
ATRIAL PACING PM: 23.57
BAMS-0001: 170 {beats}/min
RV LEAD IMPEDENCE PM: 328 Ohm
VENTRICULAR PACING PM: 29.14

## 2012-11-06 ENCOUNTER — Other Ambulatory Visit: Payer: Self-pay | Admitting: Emergency Medicine

## 2012-11-06 MED ORDER — ROSUVASTATIN CALCIUM 20 MG PO TABS: 20.0000 mg | ORAL_TABLET | Freq: Every day | ORAL | Status: DC

## 2012-11-07 ENCOUNTER — Encounter: Payer: Self-pay | Admitting: Internal Medicine

## 2012-11-12 ENCOUNTER — Encounter: Payer: Self-pay | Admitting: *Deleted

## 2012-11-14 ENCOUNTER — Encounter: Payer: Self-pay | Admitting: Internal Medicine

## 2012-11-26 ENCOUNTER — Ambulatory Visit
Admission: RE | Admit: 2012-11-26 | Discharge: 2012-11-26 | Disposition: A | Discharge status: Home / Self Care | Payer: Medicare Other | Source: Ambulatory Visit | Attending: Nurse Practitioner | Admitting: Nurse Practitioner

## 2012-11-26 ENCOUNTER — Encounter: Payer: Self-pay | Admitting: Nurse Practitioner

## 2012-11-26 ENCOUNTER — Ambulatory Visit (INDEPENDENT_AMBULATORY_CARE_PROVIDER_SITE_OTHER): Payer: Medicare Other | Admitting: Nurse Practitioner

## 2012-11-26 VITALS — BP 150/88 | HR 60 | Ht 67.5 in | Wt 212.4 lb

## 2012-11-26 DIAGNOSIS — I259 Chronic ischemic heart disease, unspecified: Secondary | ICD-10-CM

## 2012-11-26 DIAGNOSIS — Z79899 Other long term (current) drug therapy: Secondary | ICD-10-CM

## 2012-11-26 LAB — CBC WITH DIFFERENTIAL/PLATELET
Basophils Absolute: 0 10*3/uL (ref 0.0–0.1)
Basophils Relative: 0.7 % (ref 0.0–3.0)
Eosinophils Absolute: 0.3 10*3/uL (ref 0.0–0.7)
Eosinophils Relative: 4.9 % (ref 0.0–5.0)
HCT: 43.7 % (ref 39.0–52.0)
Hemoglobin: 15.3 g/dL (ref 13.0–17.0)
Lymphocytes Relative: 19.9 % (ref 12.0–46.0)
Lymphs Abs: 1.2 10*3/uL (ref 0.7–4.0)
MCHC: 35 g/dL (ref 30.0–36.0)
MCV: 95 fl (ref 78.0–100.0)
Monocytes Absolute: 0.6 10*3/uL (ref 0.1–1.0)
Monocytes Relative: 10.1 % (ref 3.0–12.0)
Neutro Abs: 3.8 10*3/uL (ref 1.4–7.7)
Neutrophils Relative %: 64.4 % (ref 43.0–77.0)
Platelets: 143 10*3/uL — ABNORMAL LOW (ref 150.0–400.0)
RBC: 4.6 Mil/uL (ref 4.22–5.81)
RDW: 13.3 % (ref 11.5–14.6)
WBC: 5.9 10*3/uL (ref 4.5–10.5)

## 2012-11-26 LAB — HEPATIC FUNCTION PANEL
ALT: 21 U/L (ref 0–53)
AST: 28 U/L (ref 0–37)
Albumin: 4.2 g/dL (ref 3.5–5.2)
Alkaline Phosphatase: 58 U/L (ref 39–117)
Bilirubin, Direct: 0.2 mg/dL (ref 0.0–0.3)
Total Bilirubin: 0.7 mg/dL (ref 0.3–1.2)
Total Protein: 7.6 g/dL (ref 6.0–8.3)

## 2012-11-26 LAB — BASIC METABOLIC PANEL
BUN: 35 mg/dL — ABNORMAL HIGH (ref 6–23)
CO2: 28 mEq/L (ref 19–32)
Calcium: 9.3 mg/dL (ref 8.4–10.5)
Chloride: 100 mEq/L (ref 96–112)
Creatinine, Ser: 2.5 mg/dL — ABNORMAL HIGH (ref 0.4–1.5)
GFR: 26.07 mL/min — ABNORMAL LOW (ref >60.00)
Glucose, Bld: 123 mg/dL — ABNORMAL HIGH (ref 70–99)
Potassium: 3.7 mEq/L (ref 3.5–5.1)
Sodium: 138 mEq/L (ref 135–145)

## 2012-11-26 LAB — TSH: TSH: 2.5 u[IU]/mL (ref 0.35–5.50)

## 2012-11-26 NOTE — Patient Instructions (Addendum)
Stay on your current medicines  Try to be as active as possible  We need to check labs today.   We will send you for a CXR today - go to [facility] at the [facility] - you can just walk in  We are going to arrange for a breathing test (PFT's with diffusion)  I will see you in August  See [name] in April  Call the [facility] office at [phone] if you have any questions, problems or concerns.

## 2012-11-26 NOTE — Progress Notes (Signed)
Kent Nolan Date of Birth: 08-28-1929 Medical Record #[id number]  History of Present Illness: Kent Nolan is seen today for a 4 month check. He is seen for Dr. Johney Nolan. He has known ischemic heart disease with prior subendocardial MI in February of 2005 with stents to the RCA and LCX. He experienced acute thrombosis of both stents in February of 2010 that was associated with cardiogenic shock and had repeat stenting to both lesions at that time with non drug eluting stents placed. Follow up cath in September of 2010 was satisfactory. He is on chronic Plavix. Other problems include DM, HTN, GERD, ACE related cough, HLD, PAF and VT back in 2010 and on chronic amiodarone therapy, CRI and remote UTIs. He does have an underlying pacemaker in place since 2011 and has had prior cardioversion for his atrial fib back in August of 2011. While he does have an elevated CHADS score, he has been maintained on chronic Plavix and aspirin therapy in light of his stent thrombosis.   I saw him back in November. He was doing ok. Not as active as we would like but no symptoms reported.   He comes in today. He is here alone today. Doing ok. Still with his hearing issues. Did not go see the audiologist. Not really interested in doing that. No chest pain. Stable DOE. Mostly limited by his back pain. Now using a cane. Blood pressure is great at home - averaging in the 125 systolic range. Feels ok on his medicines. No recent CXR or PFTs. Pacer check for next month.   Current Outpatient Prescriptions on File Prior to Visit  Medication Sig Dispense Refill  . acetaminophen (TYLENOL) 500 MG tablet Take 500 mg by mouth every 6 (six) hours as needed. For pain       . amiodarone (PACERONE) 200 MG tablet Take 1 tablet (200 mg total) by mouth daily.  30 tablet  6  . aspirin 81 MG tablet Take 81 mg by mouth daily.        . clopidogrel (PLAVIX) 75 MG tablet Take 1 tablet (75 mg total) by mouth daily.  30 tablet  6  . famotidine  (PEPCID) 40 MG tablet Take 1 tablet (40 mg total) by mouth daily.  30 tablet  4  . fenofibrate (TRICOR) 145 MG tablet Take 1 tablet (145 mg total) by mouth daily.  30 tablet  3  . fish oil-omega-3 fatty acids 1000 MG capsule Take 2 g by mouth daily.      . furosemide (LASIX) 40 MG tablet Take 1 tablet (40 mg total) by mouth daily.  30 tablet  3  . Multiple Vitamin (MULTIVITAMIN) tablet Take 1 tablet by mouth daily.        . nitroGLYCERIN (NITROSTAT) 0.4 MG SL tablet Place 0.4 mg under the tongue every 5 (five) minutes as needed. For chest pain       . rosuvastatin (CRESTOR) 20 MG tablet Take 1 tablet (20 mg total) by mouth daily.  30 tablet  5   No current facility-administered medications on file prior to visit.    Allergies  Allergen Reactions  . Penicillins   . Sulfonamide Derivatives     Past Medical History  Diagnosis Date  . Coronary artery disease     remote subendo MI in February 2005 with stents to RCA &LCX; s/p scute thrombosis of both stents in February 2010 with cardiogenic shock - s/p BMS to both lesions; follow up cath in September 2010 was  satisfactory.  He is medically managed with aspirin/Plavix  . PAF (paroxysmal atrial fibrillation)     s/p cardioversion in August 2011  . Diabetes mellitus   . GERD (gastroesophageal reflux disease)   . Hyperlipidemia   . Chronic kidney disease     followed by nephrology  . Pacemaker   . HTN (hypertension)   . Obesity   . High risk medication use     on amiodarone for PAF/VT since February 2010    Past Surgical History  Procedure Laterality Date  . Pacemaker insertion  May 2011    History  Smoking status  . Never Smoker   Smokeless tobacco  . Not on file    History  Alcohol Use No    Family History  Problem Relation Age of Onset  . Heart disease Mother     Review of Systems: The review of systems is per the HPI.  All other systems were reviewed and are negative.  Physical Exam: BP 150/88  Pulse 60  Ht  5' 7.5" (1.715 m)  Wt 212 lb 6.4 oz (96.344 kg)  BMI 32.76 kg/m2 Patient is very pleasant and in no acute distress. He is obese. Skin is warm and dry. Arms are quite ecchymotic. Color is normal.  HEENT is unremarkable. Normocephalic/atraumatic. PERRL. Sclera are nonicteric. Neck is supple. No masses. No JVD. Lungs are clear. Cardiac exam shows a regular rate and rhythm. Abdomen is obese but soft. Extremities are without edema. Gait and ROM are intact. No gross neurologic deficits noted.  LABORATORY DATA: PENDING  Lab Results  Component Value Date   WBC 6.4 07/23/2012   HGB 15.2 07/23/2012   HCT 45.9 07/23/2012   PLT 145.0* 07/23/2012   GLUCOSE 222* 07/23/2012   CHOL 109 07/23/2012   TRIG 259.0* 07/23/2012   HDL 34.60* 07/23/2012   LDLDIRECT 45.4 07/23/2012   LDLCALC  Value: 36        Total Cholesterol/HDL:CHD Risk Coronary Heart Disease Risk Table                     Men   Women  1/2 Average Risk   3.4   3.3  Average Risk       5.0   4.4  2 X Average Risk   9.6   7.1  3 X Average Risk  23.4   11.0        Use the calculated Patient Ratio above and the CHD Risk Table to determine the patient's CHD Risk.        ATP III CLASSIFICATION (LDL):  <100     mg/dL   Optimal  098-119  mg/dL   Near or Above                    Optimal  130-159  mg/dL   Borderline  147-829  mg/dL   High  >562     mg/dL   Very High 10/05/8655   ALT 25 07/23/2012   AST 27 07/23/2012   NA 138 07/23/2012   K 4.3 07/23/2012   CL 102 07/23/2012   CREATININE 2.4* 07/23/2012   BUN 35* 07/23/2012   CO2 29 07/23/2012   TSH 2.52 07/23/2012   INR 2.81* 04/20/2010   HGBA1C  Value: 6.6 (NOTE) The ADA recommends the following therapeutic goal for glycemic control related to Hgb A1c measurement: Goal of therapy: <6.5 Hgb A1c  Reference: American Diabetes Association: Clinical Practice Recommendations 2010, Diabetes Care, 2010, 33: (Suppl  1).* 05/04/2009     Assessment / Plan: 1. CAD - no active symptoms reported. Continue with  medical management.   2. PAF - on amiodarone - check follow up labs today. Arrange for CXR and PFTs as well.   3. PTVP - sees Dr. Johney Nolan next month  4. HTN - has satisfactory BP control at home.   I will see him back in August.   Patient is agreeable to this plan and will call if any problems develop in the interim.   Rosalio Macadamia, RN, ANP-C [facility] [address] [place], [place]  [address]

## 2012-12-17 ENCOUNTER — Encounter: Payer: Self-pay | Admitting: Internal Medicine

## 2012-12-24 ENCOUNTER — Ambulatory Visit (INDEPENDENT_AMBULATORY_CARE_PROVIDER_SITE_OTHER): Payer: Medicare Other | Admitting: Internal Medicine

## 2012-12-24 DIAGNOSIS — I259 Chronic ischemic heart disease, unspecified: Secondary | ICD-10-CM

## 2012-12-24 LAB — PULMONARY FUNCTION TEST

## 2012-12-24 NOTE — Progress Notes (Signed)
PFT done today. 

## 2012-12-27 ENCOUNTER — Encounter: Payer: Medicare Other | Admitting: Internal Medicine

## 2013-01-15 ENCOUNTER — Other Ambulatory Visit: Payer: Self-pay

## 2013-01-15 MED ORDER — FUROSEMIDE 40 MG PO TABS: 40.0000 mg | ORAL_TABLET | Freq: Every day | ORAL | Status: DC

## 2013-01-24 ENCOUNTER — Encounter: Payer: Self-pay | Admitting: Internal Medicine

## 2013-01-24 ENCOUNTER — Ambulatory Visit (INDEPENDENT_AMBULATORY_CARE_PROVIDER_SITE_OTHER): Payer: Medicare Other | Admitting: Internal Medicine

## 2013-01-24 VITALS — BP 132/80 | HR 63 | Ht 67.0 in | Wt 196.0 lb

## 2013-01-24 DIAGNOSIS — I2510 Atherosclerotic heart disease of native coronary artery without angina pectoris: Secondary | ICD-10-CM

## 2013-01-24 DIAGNOSIS — I498 Other specified cardiac arrhythmias: Secondary | ICD-10-CM

## 2013-01-24 DIAGNOSIS — I4891 Unspecified atrial fibrillation: Secondary | ICD-10-CM

## 2013-01-24 LAB — PACEMAKER DEVICE OBSERVATION
BAMS-0001: 170 {beats}/min
BATTERY VOLTAGE: 2.97 V
VENTRICULAR PACING PM: 24.8

## 2013-01-24 NOTE — Patient Instructions (Addendum)
Your physician wants you to follow-up in: 12 months with Dr Allred You will receive a reminder letter in the mail two months in advance. If you don't receive a letter, please call our office to schedule the follow-up appointment.   Remote monitoring is used to monitor your Pacemaker of ICD from home. This monitoring reduces the number of office visits required to check your device to one time per year. It allows us to keep an eye on the functioning of your device to ensure it is working properly. You are scheduled for a device check from home on 04/29/13. You may send your transmission at any time that day. If you have a wireless device, the transmission will be sent automatically. After your physician reviews your transmission, you will receive a postcard with your next transmission date.   

## 2013-01-24 NOTE — Progress Notes (Signed)
PCP:  Gaye Alken, MD  The patient presents today for routine electrophysiology followup.  Since last being seen in our clinic, the patient reports doing reasonably well.  He had bronchitis previously which has resolved.  Today, he denies symptoms of palpitations, chest pain, orthopnea, PND, lower extremity edema, dizziness, presyncope, syncope, or neurologic sequela.  The patient feels that he is tolerating medications without difficulties and is otherwise without complaint today.   Past Medical History  Diagnosis Date  . Coronary artery disease     remote subendo MI in February 2005 with stents to RCA &LCX; s/p scute thrombosis of both stents in February 2010 with cardiogenic shock - s/p BMS to both lesions; follow up cath in September 2010 was satisfactory.  He is medically managed with aspirin/Plavix  . PAF (paroxysmal atrial fibrillation)     s/p cardioversion in August 2011  . Diabetes mellitus   . GERD (gastroesophageal reflux disease)   . Hyperlipidemia   . Chronic kidney disease     followed by nephrology  . Pacemaker   . HTN (hypertension)   . Obesity   . High risk medication use     on amiodarone for PAF/VT since February 2010   Past Surgical History  Procedure Laterality Date  . Pacemaker insertion  May 2011    Current Outpatient Prescriptions  Medication Sig Dispense Refill  . acetaminophen (TYLENOL) 500 MG tablet Take 500 mg by mouth every 6 (six) hours as needed. For pain       . amiodarone (PACERONE) 200 MG tablet Take 1 tablet (200 mg total) by mouth daily.  30 tablet  6  . aspirin 81 MG tablet Take 81 mg by mouth daily.        . clopidogrel (PLAVIX) 75 MG tablet Take 1 tablet (75 mg total) by mouth daily.  30 tablet  6  . famotidine (PEPCID) 40 MG tablet Take 1 tablet (40 mg total) by mouth daily.  30 tablet  4  . fenofibrate (TRICOR) 145 MG tablet Take 1 tablet (145 mg total) by mouth daily.  30 tablet  3  . fish oil-omega-3 fatty acids 1000 MG  capsule Take 2 g by mouth daily.      . furosemide (LASIX) 40 MG tablet Take 1 tablet (40 mg total) by mouth daily.  30 tablet  5  . Multiple Vitamin (MULTIVITAMIN) tablet Take 1 tablet by mouth daily.        . nitroGLYCERIN (NITROSTAT) 0.4 MG SL tablet Place 0.4 mg under the tongue every 5 (five) minutes as needed. For chest pain       . rosuvastatin (CRESTOR) 20 MG tablet Take 1 tablet (20 mg total) by mouth daily.  30 tablet  5  . sitaGLIPtin (JANUVIA) 50 MG tablet Take 50 mg by mouth daily.       No current facility-administered medications for this visit.    Allergies  Allergen Reactions  . Penicillins   . Sulfonamide Derivatives     History   Social History  . Marital Status: Married    Spouse Name: N/A    Number of Children: N/A  . Years of Education: N/A   Occupational History  . Not on file.   Social History Main Topics  . Smoking status: Never Smoker   . Smokeless tobacco: Not on file  . Alcohol Use: No  . Drug Use: No  . Sexually Active: No   Other Topics Concern  . Not on file   Social  History Narrative  . No narrative on file    Physical Exam: Filed Vitals:   01/24/13 1016  BP: 132/80  Pulse: 63  Height: 5\' 7"  (1.702 m)  Weight: 196 lb (88.905 kg)  SpO2: 98%    GEN- The patient is well appearing, alert and oriented x 3 today.   Head- normocephalic, atraumatic Eyes-  Sclera clear, conjunctiva pink Ears- hearing intact Oropharynx- clear Neck- supple, no JVP Lymph- no cervical lymphadenopathy Lungs- Clear to ausculation bilaterally, normal work of breathing Chest- pacemaker pocket is well healed Heart- Regular rate and rhythm, no murmurs, rubs or gallops, PMI not laterally displaced GI- soft, NT, ND, + BS Extremities- no clubbing, cyanosis, or edema  Pacemaker interrogation- reviewed in detail today,  See PACEART report ekg today reveals sinus rhythm 63 bpm, PR 218, LAHB  Assessment and Plan:   1. Sinus node dysfunction Normal  pacemaker function See Pace Art report No changes today  2. afib No afib since last interrogation LFTs/TFTs reviewed Would recommend that we stop plavix and start eliquis should he have further afib He CAD has been stable for quite some time.  ACTIVE W would suggest increased bleeding risks with ASA/Plavix without acceptable stroke prevention when treating for stroke prevention with afib  3. CAD No ischemic symptoms Consider stopping plavix (as above)  Return in 1 year He will follow-up with Norma Fredrickson as scheduled

## 2013-02-19 ENCOUNTER — Other Ambulatory Visit: Payer: Self-pay | Admitting: Internal Medicine

## 2013-03-04 ENCOUNTER — Other Ambulatory Visit: Payer: Self-pay | Admitting: Internal Medicine

## 2013-04-03 ENCOUNTER — Other Ambulatory Visit: Payer: Self-pay | Admitting: Internal Medicine

## 2013-04-15 ENCOUNTER — Ambulatory Visit (INDEPENDENT_AMBULATORY_CARE_PROVIDER_SITE_OTHER): Payer: Medicare Other | Admitting: Nurse Practitioner

## 2013-04-15 ENCOUNTER — Encounter: Payer: Self-pay | Admitting: Nurse Practitioner

## 2013-04-15 VITALS — BP 140/90 | HR 64 | Ht 67.0 in | Wt 193.8 lb

## 2013-04-15 DIAGNOSIS — I4891 Unspecified atrial fibrillation: Secondary | ICD-10-CM

## 2013-04-15 DIAGNOSIS — I259 Chronic ischemic heart disease, unspecified: Secondary | ICD-10-CM

## 2013-04-15 DIAGNOSIS — R42 Dizziness and giddiness: Secondary | ICD-10-CM

## 2013-04-15 DIAGNOSIS — N183 Chronic kidney disease, stage 3 unspecified: Secondary | ICD-10-CM

## 2013-04-15 LAB — URINALYSIS
Bilirubin Urine: NEGATIVE
Hgb urine dipstick: NEGATIVE
Ketones, ur: NEGATIVE
Leukocytes, UA: NEGATIVE
Nitrite: NEGATIVE
Specific Gravity, Urine: 1.025 (ref 1.000–1.030)
Total Protein, Urine: NEGATIVE
Urine Glucose: NEGATIVE
Urobilinogen, UA: 1 (ref 0.0–1.0)
pH: 6 (ref 5.0–8.0)

## 2013-04-15 LAB — CBC WITH DIFFERENTIAL/PLATELET
Basophils Absolute: 0.1 10*3/uL (ref 0.0–0.1)
Basophils Relative: 0.9 % (ref 0.0–3.0)
Eosinophils Absolute: 0.3 10*3/uL (ref 0.0–0.7)
Eosinophils Relative: 4.3 % (ref 0.0–5.0)
HCT: 45 % (ref 39.0–52.0)
Hemoglobin: 14.9 g/dL (ref 13.0–17.0)
Lymphocytes Relative: 16.8 % (ref 12.0–46.0)
Lymphs Abs: 1 10*3/uL (ref 0.7–4.0)
MCHC: 33.1 g/dL (ref 30.0–36.0)
MCV: 95.7 fl (ref 78.0–100.0)
Monocytes Absolute: 0.6 10*3/uL (ref 0.1–1.0)
Monocytes Relative: 9.6 % (ref 3.0–12.0)
Neutro Abs: 4.3 10*3/uL (ref 1.4–7.7)
Neutrophils Relative %: 68.4 % (ref 43.0–77.0)
Platelets: 160 10*3/uL (ref 150.0–400.0)
RBC: 4.7 Mil/uL (ref 4.22–5.81)
RDW: 14.3 % (ref 11.5–14.6)
WBC: 6.3 10*3/uL (ref 4.5–10.5)

## 2013-04-15 LAB — LIPID PANEL
Cholesterol: 114 mg/dL (ref 0–200)
HDL: 34.5 mg/dL — ABNORMAL LOW (ref >39.00)
LDL Cholesterol: 41 mg/dL (ref 0–99)
Total CHOL/HDL Ratio: 3
Triglycerides: 192 mg/dL — ABNORMAL HIGH (ref 0.0–149.0)
VLDL: 38.4 mg/dL (ref 0.0–40.0)

## 2013-04-15 LAB — HEPATIC FUNCTION PANEL
ALT: 18 U/L (ref 0–53)
AST: 26 U/L (ref 0–37)
Albumin: 3.9 g/dL (ref 3.5–5.2)
Alkaline Phosphatase: 51 U/L (ref 39–117)
Bilirubin, Direct: 0.1 mg/dL (ref 0.0–0.3)
Total Bilirubin: 0.8 mg/dL (ref 0.3–1.2)
Total Protein: 7.4 g/dL (ref 6.0–8.3)

## 2013-04-15 LAB — VITAMIN B12: Vitamin B-12: 551 pg/mL (ref 211–911)

## 2013-04-15 LAB — BASIC METABOLIC PANEL
BUN: 36 mg/dL — ABNORMAL HIGH (ref 6–23)
CO2: 29 mEq/L (ref 19–32)
Calcium: 9.5 mg/dL (ref 8.4–10.5)
Chloride: 102 mEq/L (ref 96–112)
Creatinine, Ser: 2.6 mg/dL — ABNORMAL HIGH (ref 0.4–1.5)
GFR: 25.23 mL/min — ABNORMAL LOW (ref >60.00)
Glucose, Bld: 131 mg/dL — ABNORMAL HIGH (ref 70–99)
Potassium: 3.7 mEq/L (ref 3.5–5.1)
Sodium: 138 mEq/L (ref 135–145)

## 2013-04-15 LAB — TSH: TSH: 3.31 u[IU]/mL (ref 0.35–5.50)

## 2013-04-15 LAB — MAGNESIUM: Magnesium: 2.2 mg/dL (ref 1.5–2.5)

## 2013-04-15 NOTE — Progress Notes (Signed)
[name] Date of Birth: 1929/03/31 Medical Record #[id number]  History of Present Illness: Mr. Plaskett is seen back today for a 5 month check. Seen for [name]. Former patient of [name]. He has known  ischemic heart disease with prior subendocardial MI in February of 2005 with stents to the RCA and LCX. He experienced acute thrombosis of both stents in February of 2010 that was associated with cardiogenic shock and had repeat stenting to both lesions at that time with non drug eluting stents placed. Follow up cath in September of 2010 was satisfactory. He is on chronic Plavix. Other problems include DM, HTN, GERD, ACE related cough, HLD, PAF and VT back in 2010 and on chronic amiodarone therapy, CRI and remote UTIs. He does have an underlying pacemaker in place since 2011 and has had prior cardioversion for his atrial fib back in August of 2011. While he does have an elevated CHADS score, he has been maintained on chronic Plavix and aspirin therapy in light of his stent thrombosis.   Seen back in March by me and was doing ok. Had his CXR and PFTs updated at that time.   Saw [name] back in May and was doing ok.   Comes back today. Here with his wife. Doing ok. Has lost weight due to having a bad bout of bronchitis back in March. His weight is down about 20 pounds since then. Still trying to watch his diet and his wife notes that he is has cut back on his eating. Still limited by his back. Says his breathing is good. No chest pain. Some headache. Sees [name] and is going to have labs drawn sometime this week for him - we are trying to find out what has been ordered. Tolerating his medicines.   Current Outpatient Prescriptions  Medication Sig Dispense Refill  . acetaminophen (TYLENOL) 500 MG tablet Take 500 mg by mouth every 6 (six) hours as needed. For pain       . amiodarone (PACERONE) 200 MG tablet TAKE 1 TABLET EVERY DAY  30 tablet  3  . aspirin 81 MG tablet Take 81 mg by  mouth daily.        . clopidogrel (PLAVIX) 75 MG tablet TAKE 1 TABLET BY MOUTH EVERY DAY  30 tablet  5  . famotidine (PEPCID) 40 MG tablet TAKE 1 TABLET (40 MG TOTAL) BY MOUTH DAILY.  30 tablet  4  . fenofibrate (TRICOR) 145 MG tablet TAKE 1 TABLET BY MOUTH EVERY DAY  30 tablet  11  . fish oil-omega-3 fatty acids 1000 MG capsule Take 2 g by mouth daily.      . furosemide (LASIX) 40 MG tablet Take 1 tablet (40 mg total) by mouth daily.  30 tablet  5  . Multiple Vitamin (MULTIVITAMIN) tablet Take 1 tablet by mouth daily.        . nitroGLYCERIN (NITROSTAT) 0.4 MG SL tablet Place 0.4 mg under the tongue every 5 (five) minutes as needed. For chest pain       . rosuvastatin (CRESTOR) 20 MG tablet Take 1 tablet (20 mg total) by mouth daily.  30 tablet  5  . sitaGLIPtin (JANUVIA) 50 MG tablet Take 50 mg by mouth daily.       No current facility-administered medications for this visit.    Allergies  Allergen Reactions  . Penicillins   . Sulfonamide Derivatives     Past Medical History  Diagnosis Date  . Coronary artery  disease     remote subendo MI in February 2005 with stents to RCA &LCX; s/p scute thrombosis of both stents in February 2010 with cardiogenic shock - s/p BMS to both lesions; follow up cath in September 2010 was satisfactory.  He is medically managed with aspirin/Plavix  . PAF (paroxysmal atrial fibrillation)     s/p cardioversion in August 2011  . Diabetes mellitus   . GERD (gastroesophageal reflux disease)   . Hyperlipidemia   . Chronic kidney disease     followed by nephrology  . Pacemaker   . HTN (hypertension)   . Obesity   . High risk medication use     on amiodarone for PAF/VT since February 2010    Past Surgical History  Procedure Laterality Date  . Pacemaker insertion  May 2011    History  Smoking status  . Never Smoker   Smokeless tobacco  . Not on file    History  Alcohol Use No    Family History  Problem Relation Age of Onset  . Heart disease  Mother     Review of Systems: The review of systems is per the HPI.  All other systems were reviewed and are negative.  Physical Exam: BP 140/90  Pulse 64  Ht 5\' 7"  (1.702 m)  Wt 193 lb 12.8 oz (87.907 kg)  BMI 30.35 kg/m2 Patient is very pleasant and in no acute distress. Skin is warm and dry. Color is normal.  HEENT is unremarkable. Normocephalic/atraumatic. PERRL. Sclera are nonicteric. Neck is supple. No masses. No JVD. Lungs are clear. Cardiac exam shows a regular rate and rhythm. Abdomen is soft. Extremities are without edema. Gait and ROM are intact. No gross neurologic deficits noted.  LABORATORY DATA: Lab Results  Component Value Date   WBC 5.9 11/26/2012   HGB 15.3 11/26/2012   HCT 43.7 11/26/2012   PLT 143.0* 11/26/2012   GLUCOSE 123* 11/26/2012   CHOL 109 07/23/2012   TRIG 259.0* 07/23/2012   HDL 34.60* 07/23/2012   LDLDIRECT 45.4 07/23/2012   LDLCALC  Value: 36        Total Cholesterol/HDL:CHD Risk Coronary Heart Disease Risk Table                     Men   Women  1/2 Average Risk   3.4   3.3  Average Risk       5.0   4.4  2 X Average Risk   9.6   7.1  3 X Average Risk  23.4   11.0        Use the calculated Patient Ratio above and the CHD Risk Table to determine the patient's CHD Risk.        ATP III CLASSIFICATION (LDL):  <100     mg/dL   Optimal  213-086  mg/dL   Near or Above                    Optimal  130-159  mg/dL   Borderline  578-469  mg/dL   High  >629     mg/dL   Very High 01/31/4131   ALT 21 11/26/2012   AST 28 11/26/2012   NA 138 11/26/2012   K 3.7 11/26/2012   CL 100 11/26/2012   CREATININE 2.5* 11/26/2012   BUN 35* 11/26/2012   CO2 28 11/26/2012   TSH 2.50 11/26/2012   INR 2.81* 04/20/2010   HGBA1C  Value: 6.6 (NOTE) The ADA recommends the following  therapeutic goal for glycemic control related to Hgb A1c measurement: Goal of therapy: <6.5 Hgb A1c  Reference: American Diabetes Association: Clinical Practice Recommendations 2010, Diabetes Care, 2010, 33: (Suppl  1).*  05/04/2009      Assessment / Plan: 1. CAD - no chest pain reported. Will continue with current therapy.   2. PAF - on amiodarone - per last pacer check he had had no atrial fib. Still does not wish to go on anticoagulation but we do need to consider stopping Plavix and starting Eliquis for any further atrial fib.   3. Underlying PVTP - followed by [name].   4. HTN  5. Weight loss - intentional.   6. CKD - for follow up labs - in light of [place] needing lab as well - will try to get all here and then forward to [name] for his review.   I will see him back in 6 months.   Patient is agreeable to this plan and will call if any problems develop in the interim.   Rosalio Macadamia, RN, ANP-C [facility] [address] [place], [place]  [address]

## 2013-04-15 NOTE — Patient Instructions (Addendum)
Stay on your current medicines  I will see you in 6 months  We will see what labs we need to do here today  Call the [facility] office at [phone] if you have any questions, problems or concerns.

## 2013-04-16 LAB — PTH, INTACT AND CALCIUM
Calcium: 9.6 mg/dL (ref 8.4–10.5)
PTH: 16.8 pg/mL (ref 14.0–72.0)

## 2013-04-29 ENCOUNTER — Ambulatory Visit (INDEPENDENT_AMBULATORY_CARE_PROVIDER_SITE_OTHER): Payer: Medicare Other | Admitting: *Deleted

## 2013-04-29 DIAGNOSIS — I498 Other specified cardiac arrhythmias: Secondary | ICD-10-CM

## 2013-04-29 DIAGNOSIS — I4891 Unspecified atrial fibrillation: Secondary | ICD-10-CM

## 2013-05-10 LAB — REMOTE PACEMAKER DEVICE
AL AMPLITUDE: 1.3 mv
AL IMPEDENCE PM: 384 Ohm
ATRIAL PACING PM: 20.95
BAMS-0001: 170 {beats}/min
BATTERY VOLTAGE: 2.96 v
RV LEAD AMPLITUDE: 4.6 mv
RV LEAD IMPEDENCE PM: 316 Ohm
VENTRICULAR PACING PM: 26.97

## 2013-05-13 ENCOUNTER — Encounter: Payer: Self-pay | Admitting: *Deleted

## 2013-05-16 ENCOUNTER — Other Ambulatory Visit: Payer: Self-pay | Admitting: *Deleted

## 2013-05-16 MED ORDER — NITROGLYCERIN 0.4 MG SL SUBL: 0.4000 mg | SUBLINGUAL_TABLET | SUBLINGUAL | Status: DC | PRN

## 2013-05-17 NOTE — Progress Notes (Signed)
ppm remote check. All functions normal, full details in PaceArt.  [facility] 08/05/13 & ROV w/ [name] [id number]

## 2013-06-12 ENCOUNTER — Other Ambulatory Visit: Payer: Self-pay | Admitting: Internal Medicine

## 2013-06-13 NOTE — Telephone Encounter (Signed)
refill 

## 2013-08-05 ENCOUNTER — Other Ambulatory Visit: Payer: Self-pay | Admitting: Internal Medicine

## 2013-08-05 ENCOUNTER — Ambulatory Visit (INDEPENDENT_AMBULATORY_CARE_PROVIDER_SITE_OTHER): Payer: Medicare Other | Admitting: *Deleted

## 2013-08-05 DIAGNOSIS — I4891 Unspecified atrial fibrillation: Secondary | ICD-10-CM

## 2013-08-05 DIAGNOSIS — I498 Other specified cardiac arrhythmias: Secondary | ICD-10-CM

## 2013-08-05 LAB — MDC_IDC_ENUM_SESS_TYPE_REMOTE
Brady Statistic AP VP Percent: 7.59 %
Brady Statistic AP VS Percent: 28.28 %
Brady Statistic AS VP Percent: 14.06 %
Brady Statistic RA Percent Paced: 35.87 %
Brady Statistic RV Percent Paced: 21.65 %
Date Time Interrogation Session: 20141201144713
Lead Channel Impedance Value: 316 Ohm
Lead Channel Setting Pacing Amplitude: 2.5 V
Lead Channel Setting Pacing Pulse Width: 0.4 ms
Lead Channel Setting Sensing Sensitivity: 0.9 mV
Zone Setting Detection Interval: 350 ms

## 2013-08-20 ENCOUNTER — Encounter: Payer: Self-pay | Admitting: *Deleted

## 2013-08-22 ENCOUNTER — Other Ambulatory Visit: Payer: Self-pay | Admitting: Internal Medicine

## 2013-08-28 ENCOUNTER — Encounter: Payer: Self-pay | Admitting: Internal Medicine

## 2013-10-05 ENCOUNTER — Other Ambulatory Visit: Payer: Self-pay | Admitting: Internal Medicine

## 2013-10-07 ENCOUNTER — Ambulatory Visit (INDEPENDENT_AMBULATORY_CARE_PROVIDER_SITE_OTHER): Payer: Medicare HMO | Admitting: Nurse Practitioner

## 2013-10-07 ENCOUNTER — Ambulatory Visit (INDEPENDENT_AMBULATORY_CARE_PROVIDER_SITE_OTHER)
Admission: RE | Admit: 2013-10-07 | Discharge: 2013-10-07 | Disposition: A | Discharge status: Home / Self Care | Payer: Medicare HMO | Source: Ambulatory Visit | Attending: Nurse Practitioner | Admitting: Nurse Practitioner

## 2013-10-07 ENCOUNTER — Encounter: Payer: Self-pay | Admitting: Nurse Practitioner

## 2013-10-07 ENCOUNTER — Encounter (INDEPENDENT_AMBULATORY_CARE_PROVIDER_SITE_OTHER): Payer: Self-pay

## 2013-10-07 VITALS — BP 140/90 | HR 60 | Ht 67.0 in | Wt 198.4 lb

## 2013-10-07 DIAGNOSIS — R55 Syncope and collapse: Secondary | ICD-10-CM

## 2013-10-07 DIAGNOSIS — I2510 Atherosclerotic heart disease of native coronary artery without angina pectoris: Secondary | ICD-10-CM

## 2013-10-07 DIAGNOSIS — Z79899 Other long term (current) drug therapy: Secondary | ICD-10-CM

## 2013-10-07 DIAGNOSIS — I4891 Unspecified atrial fibrillation: Secondary | ICD-10-CM

## 2013-10-07 LAB — BASIC METABOLIC PANEL
BUN: 36 mg/dL — ABNORMAL HIGH (ref 6–23)
CO2: 27 mEq/L (ref 19–32)
Calcium: 9.4 mg/dL (ref 8.4–10.5)
Chloride: 104 mEq/L (ref 96–112)
Creatinine, Ser: 2.5 mg/dL — ABNORMAL HIGH (ref 0.4–1.5)
GFR: 26.37 mL/min — ABNORMAL LOW (ref >60.00)
Glucose, Bld: 138 mg/dL — ABNORMAL HIGH (ref 70–99)
Potassium: 4 mEq/L (ref 3.5–5.1)
Sodium: 140 mEq/L (ref 135–145)

## 2013-10-07 LAB — CBC
HCT: 46.8 % (ref 39.0–52.0)
Hemoglobin: 15.2 g/dL (ref 13.0–17.0)
MCHC: 32.5 g/dL (ref 30.0–36.0)
MCV: 98.1 fl (ref 78.0–100.0)
Platelets: 154 10*3/uL (ref 150.0–400.0)
RBC: 4.77 Mil/uL (ref 4.22–5.81)
RDW: 13.9 % (ref 11.5–14.6)
WBC: 7.3 10*3/uL (ref 4.5–10.5)

## 2013-10-07 LAB — HEPATIC FUNCTION PANEL
ALT: 18 U/L (ref 0–53)
AST: 27 U/L (ref 0–37)
Albumin: 4 g/dL (ref 3.5–5.2)
Alkaline Phosphatase: 51 U/L (ref 39–117)
Bilirubin, Direct: 0.1 mg/dL (ref 0.0–0.3)
Total Bilirubin: 0.8 mg/dL (ref 0.3–1.2)
Total Protein: 7.5 g/dL (ref 6.0–8.3)

## 2013-10-07 LAB — LIPID PANEL
Cholesterol: 120 mg/dL (ref 0–200)
HDL: 37 mg/dL — ABNORMAL LOW (ref >39.00)
LDL Cholesterol: 50 mg/dL (ref 0–99)
Total CHOL/HDL Ratio: 3
Triglycerides: 167 mg/dL — ABNORMAL HIGH (ref 0.0–149.0)
VLDL: 33.4 mg/dL (ref 0.0–40.0)

## 2013-10-07 LAB — TSH: TSH: 3.11 u[IU]/mL (ref 0.35–5.50)

## 2013-10-07 NOTE — Patient Instructions (Addendum)
We need to check labs today  We need to update your stress test and your echocardiogram  We are going to check a carotid ultrasound as well  We need to check a CT scan on your head  Stay on your current medicines  Keep monitoring your blood pressure more frequently and keep a diary  I will see you back in a month  Call the [facility] office at [phone] if you have any questions, problems or concerns.

## 2013-10-07 NOTE — Progress Notes (Signed)
[name] Date of Birth: 1929-05-10 Medical Record #[id number]  History of Present Illness: Mr. Broaddus is seen back today for his 6 month check. Seen for [name]. Former patient of [name]. He has known ischemic heart disease with prior subendocardial MI in February of 2005 with stents to the RCA and LCX. He experienced acute thrombosis of both stents in February of 2010 that was associated with cardiogenic shock and had repeat stenting to both lesions at that time with non drug eluting stents placed. Follow up cath in September of 2010 was satisfactory. He is on chronic Plavix. Other problems include DM, HTN, GERD, ACE related cough, HLD, PAF and VT back in 2010 and on chronic amiodarone therapy, CRI and remote UTIs. He does have an underlying pacemaker in place since 2011 and has had prior cardioversion for his atrial fib back in August of 2011. While he does have an elevated CHADS score, he has been maintained on chronic Plavix and aspirin therapy in light of his stent thrombosis - he has declined anticoagulation with coumadin/NOVAC - has had no recurrent atrial fib.   Last seen in August - was felt to be doing ok. Was losing weight.   Comes back today. Here with his wife. He has not been doing well since his last visit here. Tells me he has been dizzy. Getting more limited by his back pain. He has "fallen out" several times since he was here - no significant injury yet. Believes that he actually passes out. Has some warning - gets "hot". Only out for a second or two. Some of these spells are precipitated by his back pain but some are not. Last spell was in November. Pacemaker checked in December showed PPM to be ok and no AF noted. He is getting more short of breath. Less active. Has a heaviness in his chest - he says that has been there for a long time. BP lower at home.   Current Outpatient Prescriptions  Medication Sig Dispense Refill  . acetaminophen (TYLENOL) 500 MG tablet  Take 500 mg by mouth every 6 (six) hours as needed. For pain       . amiodarone (PACERONE) 200 MG tablet TAKE 1 TABLET BY MOUTH EVERY DAY  30 tablet  3  . aspirin 81 MG tablet Take 81 mg by mouth daily.        . clopidogrel (PLAVIX) 75 MG tablet TAKE 1 TABLET BY MOUTH EVERY DAY  30 tablet  5  . CRESTOR 20 MG tablet TAKE 1 TABLET BY MOUTH EVERY DAY  30 tablet  6  . famotidine (PEPCID) 40 MG tablet TAKE 1 TABLET BY MOUTH EVERY DAY  30 tablet  3  . fenofibrate (TRICOR) 145 MG tablet TAKE 1 TABLET BY MOUTH EVERY DAY  30 tablet  11  . fish oil-omega-3 fatty acids 1000 MG capsule Take 2 g by mouth daily.      . furosemide (LASIX) 40 MG tablet Take 1 tablet (40 mg total) by mouth daily.  30 tablet  5  . Multiple Vitamin (MULTIVITAMIN) tablet Take 1 tablet by mouth daily.        . nitroGLYCERIN (NITROSTAT) 0.4 MG SL tablet Place 1 tablet (0.4 mg total) under the tongue every 5 (five) minutes as needed. For chest pain  25 tablet  5  . sitaGLIPtin (JANUVIA) 50 MG tablet Take 50 mg by mouth daily.       No current facility-administered medications for this visit.  Allergies  Allergen Reactions  . Penicillins   . Sulfonamide Derivatives     Past Medical History  Diagnosis Date  . Coronary artery disease     remote subendo MI in February 2005 with stents to RCA &LCX; s/p scute thrombosis of both stents in February 2010 with cardiogenic shock - s/p BMS to both lesions; follow up cath in September 2010 was satisfactory.  He is medically managed with aspirin/Plavix  . PAF (paroxysmal atrial fibrillation)     s/p cardioversion in August 2011  . Diabetes mellitus   . GERD (gastroesophageal reflux disease)   . Hyperlipidemia   . Chronic kidney disease     followed by nephrology  . Pacemaker   . HTN (hypertension)   . Obesity   . High risk medication use     on amiodarone for PAF/VT since February 2010    Past Surgical History  Procedure Laterality Date  . Pacemaker insertion  May 2011     History  Smoking status  . Never Smoker   Smokeless tobacco  . Not on file    History  Alcohol Use No    Family History  Problem Relation Age of Onset  . Heart disease Mother     Review of Systems: The review of systems is per the HPI.  All other systems were reviewed and are negative.  Physical Exam: BP 140/90  Pulse 60  Ht 5\' 7"  (1.702 m)  Wt 198 lb 6.4 oz (89.994 kg)  BMI 31.07 kg/m2 Patient is very pleasant and in no acute distress. Sees more frail today.  Skin is warm and dry. Arms are ecchymotic.  Color is normal.  HEENT is unremarkable. Normocephalic/atraumatic. PERRL. Sclera are nonicteric. Neck is supple. No masses. No JVD. Lungs are clear. Cardiac exam shows a regular rate and rhythm. Abdomen is soft. Extremities are without edema. Gait and ROM are intact. No gross neurologic deficits noted.  Wt Readings from Last 3 Encounters:  10/07/13 198 lb 6.4 oz (89.994 kg)  04/15/13 193 lb 12.8 oz (87.907 kg)  01/24/13 196 lb (88.905 kg)     LABORATORY DATA:  Lab Results  Component Value Date   WBC 6.3 04/15/2013   HGB 14.9 04/15/2013   HCT 45.0 04/15/2013   PLT 160.0 04/15/2013   GLUCOSE 131* 04/15/2013   CHOL 114 04/15/2013   TRIG 192.0* 04/15/2013   HDL 34.50* 04/15/2013   LDLDIRECT 45.4 07/23/2012   LDLCALC 41 04/15/2013   ALT 18 04/15/2013   AST 26 04/15/2013   NA 138 04/15/2013   K 3.7 04/15/2013   CL 102 04/15/2013   CREATININE 2.6* 04/15/2013   BUN 36* 04/15/2013   CO2 29 04/15/2013   TSH 3.31 04/15/2013   INR 2.81* 04/20/2010   HGBA1C  Value: 6.6 (NOTE) The ADA recommends the following therapeutic goal for glycemic control related to Hgb A1c measurement: Goal of therapy: <6.5 Hgb A1c  Reference: American Diabetes Association: Clinical Practice Recommendations 2010, Diabetes Care, 2010, 33: (Suppl  1).* 05/04/2009     Assessment / Plan: 1. CAD - some chest discomfort reported - hard to say if this is his angina but he has known CAD with past events - he is 5  years from his last event - will update his Myoview.   2. PAF - on amiodarone -  Still does not wish to go on anticoagulation but we do need to consider stopping Plavix and starting Eliquis for any further atrial fib. NO AF noted on  the recent remote check  3. Underlying PVTP - followed by Dr. Johney FrameAllred.   4. ?Syncope - will check carotids, CT of the head and update his echo.   6. CKD - followed by Dr. Allena KatzPatel - sees him later this week  7. DOE - will update his echo as well.   We will check these mentioned studies, see him back for discussion. Further disposition to follow.   Patient is agreeable to this plan and will call if any problems develop in the interim.    Rosalio MacadamiaLori C. Jerolene Kupfer, RN, ANP-C [facility] [address] [place], [place]  [id number] [phone]

## 2013-10-09 ENCOUNTER — Ambulatory Visit (HOSPITAL_COMMUNITY)
Admission: RE | Admit: 2013-10-09 | Discharge: 2013-10-09 | Disposition: A | Discharge status: Home / Self Care | Payer: Medicare HMO | Source: Ambulatory Visit | Attending: Cardiovascular Disease | Admitting: Cardiovascular Disease

## 2013-10-09 ENCOUNTER — Encounter (HOSPITAL_COMMUNITY): Payer: Medicare HMO

## 2013-10-09 DIAGNOSIS — I2510 Atherosclerotic heart disease of native coronary artery without angina pectoris: Secondary | ICD-10-CM

## 2013-10-09 DIAGNOSIS — R42 Dizziness and giddiness: Secondary | ICD-10-CM | POA: Insufficient documentation

## 2013-10-09 DIAGNOSIS — I4891 Unspecified atrial fibrillation: Secondary | ICD-10-CM

## 2013-10-09 DIAGNOSIS — Z79899 Other long term (current) drug therapy: Secondary | ICD-10-CM

## 2013-10-09 DIAGNOSIS — R55 Syncope and collapse: Secondary | ICD-10-CM

## 2013-10-09 NOTE — Progress Notes (Signed)
Carotid Duplex Completed. 

## 2013-10-10 ENCOUNTER — Ambulatory Visit (HOSPITAL_COMMUNITY)
Admission: RE | Admit: 2013-10-10 | Discharge: 2013-10-10 | Disposition: A | Discharge status: Home / Self Care | Payer: Medicare HMO | Source: Ambulatory Visit | Attending: Cardiovascular Disease | Admitting: Cardiovascular Disease

## 2013-10-10 DIAGNOSIS — R0989 Other specified symptoms and signs involving the circulatory and respiratory systems: Secondary | ICD-10-CM | POA: Insufficient documentation

## 2013-10-10 DIAGNOSIS — I129 Hypertensive chronic kidney disease with stage 1 through stage 4 chronic kidney disease, or unspecified chronic kidney disease: Secondary | ICD-10-CM | POA: Insufficient documentation

## 2013-10-10 DIAGNOSIS — I252 Old myocardial infarction: Secondary | ICD-10-CM | POA: Insufficient documentation

## 2013-10-10 DIAGNOSIS — R55 Syncope and collapse: Secondary | ICD-10-CM | POA: Insufficient documentation

## 2013-10-10 DIAGNOSIS — Z87891 Personal history of nicotine dependence: Secondary | ICD-10-CM | POA: Insufficient documentation

## 2013-10-10 DIAGNOSIS — R0609 Other forms of dyspnea: Secondary | ICD-10-CM | POA: Insufficient documentation

## 2013-10-10 DIAGNOSIS — Z79899 Other long term (current) drug therapy: Secondary | ICD-10-CM

## 2013-10-10 DIAGNOSIS — E119 Type 2 diabetes mellitus without complications: Secondary | ICD-10-CM | POA: Insufficient documentation

## 2013-10-10 DIAGNOSIS — R079 Chest pain, unspecified: Secondary | ICD-10-CM | POA: Insufficient documentation

## 2013-10-10 DIAGNOSIS — E785 Hyperlipidemia, unspecified: Secondary | ICD-10-CM | POA: Insufficient documentation

## 2013-10-10 DIAGNOSIS — R5381 Other malaise: Secondary | ICD-10-CM | POA: Insufficient documentation

## 2013-10-10 DIAGNOSIS — I4891 Unspecified atrial fibrillation: Secondary | ICD-10-CM | POA: Insufficient documentation

## 2013-10-10 DIAGNOSIS — Z9861 Coronary angioplasty status: Secondary | ICD-10-CM | POA: Insufficient documentation

## 2013-10-10 DIAGNOSIS — R5383 Other fatigue: Secondary | ICD-10-CM

## 2013-10-10 DIAGNOSIS — N189 Chronic kidney disease, unspecified: Secondary | ICD-10-CM | POA: Insufficient documentation

## 2013-10-10 DIAGNOSIS — I2510 Atherosclerotic heart disease of native coronary artery without angina pectoris: Secondary | ICD-10-CM | POA: Insufficient documentation

## 2013-10-10 DIAGNOSIS — E663 Overweight: Secondary | ICD-10-CM | POA: Insufficient documentation

## 2013-10-10 DIAGNOSIS — R42 Dizziness and giddiness: Secondary | ICD-10-CM | POA: Insufficient documentation

## 2013-10-10 MED ORDER — TECHNETIUM TC 99M SESTAMIBI GENERIC - CARDIOLITE
10.3000 | Freq: Once | INTRAVENOUS | Status: AC | PRN
  Administered 2013-10-10: 10 via INTRAVENOUS

## 2013-10-10 MED ORDER — REGADENOSON 0.4 MG/5ML IV SOLN
0.4000 mg | Freq: Once | INTRAVENOUS | Status: AC
  Administered 2013-10-10: 0.4 mg via INTRAVENOUS

## 2013-10-10 MED ORDER — TECHNETIUM TC 99M SESTAMIBI GENERIC - CARDIOLITE
29.4000 | Freq: Once | INTRAVENOUS | Status: AC | PRN
  Administered 2013-10-10: 29 via INTRAVENOUS

## 2013-10-10 NOTE — Procedures (Addendum)
 [place] [facility] [address] [place] [address] [phone]  Cardiology Nuclear Med Study  Jovita GammaWilliam P Domingo is a 80 y.o. male     MRN : [id number]     DOB: [birth year]  Procedure Date: 10/10/2013  Nuclear Med Background Indication for Stress Test:  Stent Patency History:  CAD;MI-10/2003;STENT/PTCA-10/2003 AND 2010;PACER;AFIB;SB;CARDIOGENIC SHOCK;CARDIOVERSION-2011;CKD Cardiac Risk Factors: History of Smoking, Hypertension, Lipids, NIDDM and Overweight  Symptoms:  Chest Pain, Dizziness, DOE, Fatigue, Light-Headedness and Near Syncope   Nuclear Pre-Procedure Caffeine/Decaff Intake:  7:00pm NPO After: 5:00am   IV Site: R Antecubital  IV 0.9% NS with Angio Cath:  22g  Chest Size (in):  42"  IV Started by: Emmit PomfretAmanda Hicks, RN  Height: 5\' 7"  (1.702 m)  Cup Size: n/a  BMI:  Body mass index is 31 kg/(m^2). Weight:  198 lb (89.812 kg)   Tech Comments:  n/a    Nuclear Med Study 1 or 2 day study: 1 day  Stress Test Type:  Lexiscan  Order Authorizing Provider:  Hillis RangeJames Allred, MD   Resting Radionuclide: Technetium 719m Sestamibi  Resting Radionuclide Dose: 10.3 mCi   Stress Radionuclide:  Technetium 539m Sestamibi  Stress Radionuclide Dose: 29.4 mCi           Stress Protocol Rest HR: 65 Stress HR: 68  Rest BP: 143/99 Stress BP: 152/78  Exercise Time (min): n/a METS: n/a   Predicted Max HR: 138 bpm % Max HR: 50 bpm Rate Pressure Product: [id number]  Dose of Adenosine (mg):  n/a Dose of Lexiscan: 0.4 mg  Dose of Atropine (mg): n/a Dose of Dobutamine: n/a mcg/kg/min (at max HR)  Stress Test Technologist: Esperanza Sheetserry-Marie Martin, CCT Nuclear Technologist: Gonzella LexPam Phillips, CNMT   Rest Procedure:  Myocardial perfusion imaging was performed at rest 45 minutes following the intravenous administration of Technetium 239m Sestamibi. Stress Procedure:  The patient received IV Lexiscan 0.4 mg over 15-seconds.  Technetium 1039m Sestamibi injected at 30-seconds.   There were no significant changes with Lexiscan.  Quantitative spect images were obtained after a 45 minute delay.  Transient Ischemic Dilatation (Normal <1.22):  0.94 Lung/Heart Ratio (Normal <0.45):  0.44 QGS EDV:  97 ml QGS ESV:  51 ml LV Ejection Fraction: 47%  Rest ECG: NSR-LBBB  Stress ECG: No significant change from baseline ECG  QPS Raw Data Images:  High hepatic uptake, but no interference on the cardiac images Stress Images:  large perfusion defect of the apical, inferior and lateral walls Rest Images:  large perfusion defect of the apical, inferior and lateral walls Subtraction (SDS):  Mostly fixed inferolateral defect representing scar  Impression Exercise Capacity:  Lexiscan with no exercise. BP Response:  Hypotensive blood pressure response. Clinical Symptoms:  No significant symptoms noted. ECG Impression:  No significant ECG changes with Lexiscan. Comparison with Prior Nuclear Study: No images to compare  Overall Impression:  Intermediate risk stress nuclear study with large (35% of myocardium) mostly fixed inferolateral defect suggesting of LCx territory scar. No significant reversible ischemia.  LV Wall Motion:  LVEF 47%, with inferolateral akinesis  Chrystie NoseKenneth C. Hilty, MD, [facility] PortlandFACC Board Certified in Nuclear Cardiology Attending Cardiologist [facility]  Chrystie NoseHILTY,Kenneth C, MD  10/10/2013 11:13 AM

## 2013-10-14 ENCOUNTER — Ambulatory Visit (HOSPITAL_COMMUNITY)
Admission: RE | Admit: 2013-10-14 | Discharge: 2013-10-14 | Disposition: A | Discharge status: Home / Self Care | Payer: Medicare HMO | Source: Ambulatory Visit | Attending: Cardiovascular Disease | Admitting: Cardiovascular Disease

## 2013-10-14 DIAGNOSIS — E785 Hyperlipidemia, unspecified: Secondary | ICD-10-CM | POA: Insufficient documentation

## 2013-10-14 DIAGNOSIS — Z79899 Other long term (current) drug therapy: Secondary | ICD-10-CM

## 2013-10-14 DIAGNOSIS — I079 Rheumatic tricuspid valve disease, unspecified: Secondary | ICD-10-CM | POA: Insufficient documentation

## 2013-10-14 DIAGNOSIS — R55 Syncope and collapse: Secondary | ICD-10-CM

## 2013-10-14 DIAGNOSIS — I379 Nonrheumatic pulmonary valve disorder, unspecified: Secondary | ICD-10-CM | POA: Insufficient documentation

## 2013-10-14 DIAGNOSIS — I059 Rheumatic mitral valve disease, unspecified: Secondary | ICD-10-CM

## 2013-10-14 DIAGNOSIS — I2510 Atherosclerotic heart disease of native coronary artery without angina pectoris: Secondary | ICD-10-CM

## 2013-10-14 DIAGNOSIS — I4891 Unspecified atrial fibrillation: Secondary | ICD-10-CM | POA: Insufficient documentation

## 2013-10-14 DIAGNOSIS — I10 Essential (primary) hypertension: Secondary | ICD-10-CM | POA: Insufficient documentation

## 2013-10-14 NOTE — Progress Notes (Signed)
2D Echo Performed 10/14/2013    Glenda Spelman, RCS  

## 2013-11-04 ENCOUNTER — Ambulatory Visit (INDEPENDENT_AMBULATORY_CARE_PROVIDER_SITE_OTHER): Payer: Medicare HMO | Admitting: Nurse Practitioner

## 2013-11-04 ENCOUNTER — Encounter: Payer: Self-pay | Admitting: Nurse Practitioner

## 2013-11-04 VITALS — BP 140/70 | HR 64 | Ht 67.0 in | Wt 198.8 lb

## 2013-11-04 DIAGNOSIS — I2510 Atherosclerotic heart disease of native coronary artery without angina pectoris: Secondary | ICD-10-CM

## 2013-11-04 DIAGNOSIS — R55 Syncope and collapse: Secondary | ICD-10-CM

## 2013-11-04 MED ORDER — FUROSEMIDE 40 MG PO TABS: 20.0000 mg | ORAL_TABLET | Freq: Every day | ORAL | Status: DC

## 2013-11-04 NOTE — Progress Notes (Signed)
Kent Nolan Date of Birth: 04-17-1929 Medical Record #[id number]  History of Present Illness: Kent Nolan is seen back today for his 6 month check. Seen for [name]. Former patient of [name]. He has known ischemic heart disease with prior subendocardial MI in February of 2005 with stents to the RCA and LCX. He experienced acute thrombosis of both stents in February of 2010 that was associated with cardiogenic shock and had repeat stenting to both lesions at that time with non drug eluting stents placed. Follow up cath in September of 2010 was satisfactory. He is on chronic Plavix. Other problems include DM, HTN, GERD, ACE related cough, HLD, PAF and VT back in 2010 and on chronic amiodarone therapy, CRI and remote UTIs. He does have an underlying pacemaker in place since 2011 and has had prior cardioversion for his atrial fib back in August of 2011. While he does have an elevated CHADS score, he has been maintained on chronic Plavix and aspirin therapy in light of his stent thrombosis - he has declined anticoagulation with coumadin/NOVAC - has had no recurrent atrial fib. EF by echo from 2010 was 50 to 55%.  Last seen in August - was felt to be doing ok. Was losing weight.   Seen last month - really starting to slow down. Was dizzy. Getting more limited by his back pain. Had "fallen out" several times since he was here - no significant injury yet. Believes that he actually passes out. Had some warning - gets "hot". Only out for a second or two. Some of these spells are precipitated by his back pain but some were not. Last spell was in November. Pacemaker checked in December showed PPM to be ok and no AF noted. He was getting more short of breath. Less active. Had a heaviness in his chest - he says that has been there for a long time. BP lower at home. We updated his echo, Myoview, checked head CT and carotids.   Comes back today. Here alone today. Wife is at home because their furnace  burned out. He still gets dizzy - seems to be more when he has sat or laid down for extended periods. No recurrent passing out. No chest pain. Admits that he is getting more forgetful. No swelling. Breathing ok.   Current Outpatient Prescriptions  Medication Sig Dispense Refill  . acetaminophen (TYLENOL) 500 MG tablet Take 500 mg by mouth every 6 (six) hours as needed. For pain       . amiodarone (PACERONE) 200 MG tablet TAKE 1 TABLET BY MOUTH EVERY DAY  30 tablet  3  . aspirin 81 MG tablet Take 81 mg by mouth daily.        . clopidogrel (PLAVIX) 75 MG tablet TAKE 1 TABLET BY MOUTH EVERY DAY  30 tablet  1  . CRESTOR 20 MG tablet TAKE 1 TABLET BY MOUTH EVERY DAY  30 tablet  6  . famotidine (PEPCID) 40 MG tablet TAKE 1 TABLET BY MOUTH EVERY DAY  30 tablet  3  . fenofibrate (TRICOR) 145 MG tablet TAKE 1 TABLET BY MOUTH EVERY DAY  30 tablet  11  . fish oil-omega-3 fatty acids 1000 MG capsule Take 2 g by mouth daily.      . furosemide (LASIX) 40 MG tablet Take 1 tablet (40 mg total) by mouth daily.  30 tablet  5  . Multiple Vitamin (MULTIVITAMIN) tablet Take 1 tablet by mouth daily.        Marland Kitchen  nitroGLYCERIN (NITROSTAT) 0.4 MG SL tablet Place 1 tablet (0.4 mg total) under the tongue every 5 (five) minutes as needed. For chest pain  25 tablet  5  . sitaGLIPtin (JANUVIA) 50 MG tablet Take 50 mg by mouth daily.       No current facility-administered medications for this visit.    Allergies  Allergen Reactions  . Penicillins   . Sulfonamide Derivatives     Past Medical History  Diagnosis Date  . Coronary artery disease     remote subendo MI in February 2005 with stents to RCA &LCX; s/p scute thrombosis of both stents in February 2010 with cardiogenic shock - s/p BMS to both lesions; follow up cath in September 2010 was satisfactory.  He is medically managed with aspirin/Plavix  . PAF (paroxysmal atrial fibrillation)     s/p cardioversion in August 2011  . Diabetes mellitus   . GERD  (gastroesophageal reflux disease)   . Hyperlipidemia   . Chronic kidney disease     followed by nephrology  . Pacemaker   . HTN (hypertension)   . Obesity   . High risk medication use     on amiodarone for PAF/VT since February 2010    Past Surgical History  Procedure Laterality Date  . Pacemaker insertion  May 2011    History  Smoking status  . Never Smoker   Smokeless tobacco  . Not on file    History  Alcohol Use No    Family History  Problem Relation Age of Onset  . Heart disease Mother     Review of Systems: The review of systems is per the HPI.  All other systems were reviewed and are negative.  Physical Exam: BP 140/70  Pulse 64  Ht 5\' 7"  (1.702 m)  Wt 198 lb 12.8 oz (90.175 kg)  BMI 31.13 kg/m2  SpO2 97% BP 130/80 sitting and 80/50 standing. Patient is very pleasant and in no acute distress. Skin is warm and dry. Color is normal. He has lots of chronic ecchymosis.  HEENT is unremarkable. Normocephalic/atraumatic. PERRL. Sclera are nonicteric. Neck is supple. No masses. No JVD. Lungs are clear. Cardiac exam shows a regular rate and rhythm. Abdomen is soft. Extremities are without edema. Gait and ROM are intact. No gross neurologic deficits noted.  LABORATORY DATA: Lab Results  Component Value Date   WBC 7.3 10/07/2013   HGB 15.2 10/07/2013   HCT 46.8 10/07/2013   PLT 154.0 10/07/2013   GLUCOSE 138* 10/07/2013   CHOL 120 10/07/2013   TRIG 167.0* 10/07/2013   HDL 37.00* 10/07/2013   LDLDIRECT 45.4 07/23/2012   LDLCALC 50 10/07/2013   ALT 18 10/07/2013   AST 27 10/07/2013   NA 140 10/07/2013   K 4.0 10/07/2013   CL 104 10/07/2013   CREATININE 2.5* 10/07/2013   BUN 36* 10/07/2013   CO2 27 10/07/2013   TSH 3.11 10/07/2013   INR 2.81* 04/20/2010   HGBA1C  Value: 6.6 (NOTE) The ADA recommends the following therapeutic goal for glycemic control related to Hgb A1c measurement: Goal of therapy: <6.5 Hgb A1c  Reference: American Diabetes Association: Clinical Practice Recommendations 2010,  Diabetes Care, 2010, 33: (Suppl  1).* 05/04/2009   Echo Study Conclusions from February 2015  - Left ventricle: The cavity size was normal. Systolic function was mildly reduced. The estimated ejection fraction was in the range of 45% to 50%. There was an increased relative contribution of atrial contraction to ventricular filling. Doppler parameters are consistent with  abnormal left ventricular relaxation (grade 1 diastolic dysfunction). - Aortic valve: Moderate thickening and calcification, consistent with sclerosis. - Aorta: Aortic root dimension: 38mm (ED). - Ascending aorta: The ascending aorta was mildly dilated. - Mitral valve: Moderate regurgitation. Valve area by pressure half-time: 1.86cm^2.  Myoview Overall Impression:  Intermediate risk stress nuclear study with  large (35% of myocardium) mostly fixed inferolateral defect  suggesting of LCx territory scar. No significant reversible  ischemia.  LV Wall Motion: LVEF 47%, with inferolateral akinesis  Chrystie NoseKenneth C. Hilty, MD, [facility] HospitalFACC Board Certified in Nuclear Cardiology Attending Cardiologist [facility]  Chrystie NoseHILTY,Kenneth C, MD  10/10/2013 11:13 AM   Assessment / Plan: 1. Dizziness - most likely orthostatic in nature - will cut his lasix back, use support stockings, liberalize his salt use (just a little since his EF has dropped some). I have called his wife and talked with her about the test results and our plan of care.   2. CAD - stable - will manage medically.  3. Mild LV dysfunction - wife is going to try and weigh daily.   4. Mild carotid disease  5. PAF - on amiodarone  Will see him back in about 6 weeks. Wife will continue to monitor his BP at home. May have to use Florinef.  Patient is agreeable to this plan and will call if any problems develop in the interim.   Rosalio MacadamiaLori C. Nora Rooke, RN, ANP-C [facility] [address] [place], [place]  [id number] [phone]

## 2013-11-04 NOTE — Patient Instructions (Addendum)
I am cutting the Lasix back to just a half a pill each morning  Try to weigh each day  Wear support stockings during the day  May increase your salt intake just a little  Keep a check on your blood pressure  I will see you in 6 weeks  Call the [facility] office at [phone] if you have any questions, problems or concerns.

## 2013-11-06 ENCOUNTER — Ambulatory Visit (INDEPENDENT_AMBULATORY_CARE_PROVIDER_SITE_OTHER): Payer: Medicare HMO | Admitting: *Deleted

## 2013-11-06 DIAGNOSIS — I498 Other specified cardiac arrhythmias: Secondary | ICD-10-CM

## 2013-11-06 DIAGNOSIS — I4891 Unspecified atrial fibrillation: Secondary | ICD-10-CM

## 2013-11-07 ENCOUNTER — Encounter: Payer: Self-pay | Admitting: Internal Medicine

## 2013-11-07 LAB — MDC_IDC_ENUM_SESS_TYPE_REMOTE
Lead Channel Setting Pacing Amplitude: 2.5 V
Lead Channel Setting Pacing Amplitude: 5 V
Lead Channel Setting Sensing Sensitivity: 0.9 mV
MDC IDC SET LEADCHNL RV PACING PULSEWIDTH: 0.4 ms
Zone Setting Detection Interval: 350 ms
Zone Setting Detection Interval: 400 ms

## 2013-11-11 ENCOUNTER — Other Ambulatory Visit: Payer: Self-pay | Admitting: Internal Medicine

## 2013-11-11 ENCOUNTER — Encounter: Payer: Self-pay | Admitting: Internal Medicine

## 2013-11-11 ENCOUNTER — Ambulatory Visit (INDEPENDENT_AMBULATORY_CARE_PROVIDER_SITE_OTHER): Payer: Medicare HMO | Admitting: Internal Medicine

## 2013-11-11 ENCOUNTER — Encounter: Payer: Self-pay | Admitting: *Deleted

## 2013-11-11 VITALS — BP 118/72 | HR 65 | Ht 67.0 in | Wt 198.2 lb

## 2013-11-11 DIAGNOSIS — I10 Essential (primary) hypertension: Secondary | ICD-10-CM

## 2013-11-11 DIAGNOSIS — Z45018 Encounter for adjustment and management of other part of cardiac pacemaker: Secondary | ICD-10-CM

## 2013-11-11 DIAGNOSIS — I498 Other specified cardiac arrhythmias: Secondary | ICD-10-CM

## 2013-11-11 DIAGNOSIS — I2510 Atherosclerotic heart disease of native coronary artery without angina pectoris: Secondary | ICD-10-CM

## 2013-11-11 DIAGNOSIS — I4891 Unspecified atrial fibrillation: Secondary | ICD-10-CM

## 2013-11-11 LAB — MDC_IDC_ENUM_SESS_TYPE_INCLINIC
Battery Voltage: 2.9 V
Brady Statistic AP VS Percent: 23.6 %
Brady Statistic AS VS Percent: 47.98 %
Brady Statistic RA Percent Paced: 30.32 %
Brady Statistic RV Percent Paced: 28.42 %
Date Time Interrogation Session: 20150309171255
Lead Channel Impedance Value: 400 Ohm
Lead Channel Pacing Threshold Amplitude: 1 V
Lead Channel Pacing Threshold Pulse Width: 0.4 ms
Lead Channel Setting Pacing Amplitude: 2.5 V
Lead Channel Setting Pacing Amplitude: 5 V
Lead Channel Setting Sensing Sensitivity: 0.9 mV
MDC IDC MSMT LEADCHNL RA PACING THRESHOLD AMPLITUDE: 3 V
MDC IDC MSMT LEADCHNL RA PACING THRESHOLD PULSEWIDTH: 1.5 ms
MDC IDC MSMT LEADCHNL RA SENSING INTR AMPL: 1.716 mV
MDC IDC MSMT LEADCHNL RV IMPEDANCE VALUE: 324 Ohm
MDC IDC MSMT LEADCHNL RV SENSING INTR AMPL: 5.9731
MDC IDC SET LEADCHNL RV PACING PULSEWIDTH: 0.4 ms
MDC IDC STAT BRADY AP VP PERCENT: 6.72 %
MDC IDC STAT BRADY AS VP PERCENT: 21.69 %
Zone Setting Detection Interval: 350 ms
Zone Setting Detection Interval: 400 ms

## 2013-11-11 NOTE — Patient Instructions (Signed)
Will need to have battery replace in device  See instruction sheet

## 2013-11-13 NOTE — Progress Notes (Signed)
PCP:  Oliver BarreJames John, MD  The patient presents today for routine electrophysiology followup.  Since last being seen in our clinic, the patient reports doing reasonably well.  He has occasional SOB.  He also has postural dizziness. Today, he denies symptoms of palpitations, chest pain, orthopnea, PND, lower extremity edema,  presyncope, syncope, or neurologic sequela.  The patient feels that he is tolerating medications without difficulties and is otherwise without complaint today.  He has reached ERI battery status.  Past Medical History  Diagnosis Date  . Coronary artery disease     remote subendo MI in February 2005 with stents to RCA &LCX; s/p scute thrombosis of both stents in February 2010 with cardiogenic shock - s/p BMS to both lesions; follow up cath in September 2010 was satisfactory.  He is medically managed with aspirin/Plavix  . PAF (paroxysmal atrial fibrillation)     s/p cardioversion in August 2011  . Diabetes mellitus   . GERD (gastroesophageal reflux disease)   . Hyperlipidemia   . Chronic kidney disease     followed by nephrology  . Pacemaker   . HTN (hypertension)   . Obesity   . High risk medication use     on amiodarone for PAF/VT since February 2010   Past Surgical History  Procedure Laterality Date  . Pacemaker insertion  May 2011    Current Outpatient Prescriptions  Medication Sig Dispense Refill  . acetaminophen (TYLENOL) 500 MG tablet Take 500 mg by mouth every 6 (six) hours as needed. For pain       . amiodarone (PACERONE) 200 MG tablet TAKE 1 TABLET BY MOUTH EVERY DAY  30 tablet  3  . aspirin 81 MG tablet Take 81 mg by mouth daily.        . clopidogrel (PLAVIX) 75 MG tablet TAKE 1 TABLET BY MOUTH EVERY DAY  30 tablet  1  . CRESTOR 20 MG tablet TAKE 1 TABLET BY MOUTH EVERY DAY  30 tablet  6  . famotidine (PEPCID) 40 MG tablet TAKE 1 TABLET BY MOUTH EVERY DAY  30 tablet  3  . fenofibrate (TRICOR) 145 MG tablet TAKE 1 TABLET BY MOUTH EVERY DAY  30 tablet  11  .  fish oil-omega-3 fatty acids 1000 MG capsule Take 2 g by mouth daily.      . furosemide (LASIX) 40 MG tablet Take 0.5 tablets (20 mg total) by mouth daily.  90 tablet  3  . Multiple Vitamin (MULTIVITAMIN) tablet Take 1 tablet by mouth daily.        . nitroGLYCERIN (NITROSTAT) 0.4 MG SL tablet Place 1 tablet (0.4 mg total) under the tongue every 5 (five) minutes as needed. For chest pain  25 tablet  5  . sitaGLIPtin (JANUVIA) 50 MG tablet Take 50 mg by mouth daily.       No current facility-administered medications for this visit.    Allergies  Allergen Reactions  . Penicillins   . Sulfonamide Derivatives     History   Social History  . Marital Status: Married    Spouse Name: N/A    Number of Children: N/A  . Years of Education: N/A   Occupational History  . Not on file.   Social History Main Topics  . Smoking status: Never Smoker   . Smokeless tobacco: Not on file  . Alcohol Use: No  . Drug Use: No  . Sexual Activity: No   Other Topics Concern  . Not on file   Social  History Narrative  . No narrative on file    Physical Exam: Filed Vitals:   11/11/13 1612  BP: 118/72  Pulse: 65  Height: 5\' 7"  (1.702 m)  Weight: 198 lb 3.2 oz (89.903 kg)    GEN- The patient is elderly appearing, alert and oriented x 3 today.   Head- normocephalic, atraumatic Eyes-  Sclera clear, conjunctiva pink Ears- hearing intact Oropharynx- clear Neck- supple, no JVP Lymph- no cervical lymphadenopathy Lungs- Clear to ausculation bilaterally, normal work of breathing Chest- R sided pacemaker pocket is well healed Heart- Regular rate and rhythm, no murmurs, rubs or gallops, PMI not laterally displaced GI- soft, NT, ND, + BS Extremities- no clubbing, cyanosis, or edema  Pacemaker interrogation- reviewed in detail today,  See PACEART report ekg today reveals V pacing at 65 bpm with VA dissociation  Assessment and Plan:   1. Sinus node dysfunction The patient has reached ERI battery  status.  This is premature and likely due to his MDT Enrhythm battery.   He does have a high atrial pacing threshold.  Given his advanced age, I would like to avoid lead revision if possible. He only A paces 30%.  With a new device it may be that I will be able to use unipolar pacing to avoid lead revision.  Risks, benefits, and alternatives to PPM generator change with possible lead revision were discussed at length with the patient and his family who wish to proceed.  We will schedule the procedure at the next available time.  2. afib No afib since last interrogation He is on ASA and Plavix for afib.  He remains convinced that he should stay on plavix rather than consider a novel anticoagulation despite a long discussion by me.  We will hold plavix for 7 days prior to generator change.  3. CAD No ischemic symptoms

## 2013-11-22 ENCOUNTER — Encounter (HOSPITAL_COMMUNITY): Payer: Self-pay | Admitting: Pharmacy Technician

## 2013-11-26 ENCOUNTER — Encounter: Payer: Self-pay | Admitting: Internal Medicine

## 2013-11-26 ENCOUNTER — Ambulatory Visit (INDEPENDENT_AMBULATORY_CARE_PROVIDER_SITE_OTHER): Payer: Medicare HMO | Admitting: Internal Medicine

## 2013-11-26 ENCOUNTER — Other Ambulatory Visit (INDEPENDENT_AMBULATORY_CARE_PROVIDER_SITE_OTHER): Payer: Medicare HMO

## 2013-11-26 VITALS — BP 112/70 | HR 68 | Temp 97.4°F | Wt 199.0 lb

## 2013-11-26 DIAGNOSIS — IMO0001 Reserved for inherently not codable concepts without codable children: Secondary | ICD-10-CM

## 2013-11-26 DIAGNOSIS — Z01818 Encounter for other preprocedural examination: Secondary | ICD-10-CM

## 2013-11-26 DIAGNOSIS — N189 Chronic kidney disease, unspecified: Secondary | ICD-10-CM | POA: Insufficient documentation

## 2013-11-26 DIAGNOSIS — Z0000 Encounter for general adult medical examination without abnormal findings: Secondary | ICD-10-CM

## 2013-11-26 DIAGNOSIS — K219 Gastro-esophageal reflux disease without esophagitis: Secondary | ICD-10-CM | POA: Insufficient documentation

## 2013-11-26 DIAGNOSIS — Z950 Presence of cardiac pacemaker: Secondary | ICD-10-CM | POA: Insufficient documentation

## 2013-11-26 DIAGNOSIS — E785 Hyperlipidemia, unspecified: Secondary | ICD-10-CM | POA: Insufficient documentation

## 2013-11-26 DIAGNOSIS — Z0001 Encounter for general adult medical examination with abnormal findings: Secondary | ICD-10-CM | POA: Insufficient documentation

## 2013-11-26 DIAGNOSIS — I480 Paroxysmal atrial fibrillation: Secondary | ICD-10-CM | POA: Insufficient documentation

## 2013-11-26 DIAGNOSIS — E119 Type 2 diabetes mellitus without complications: Secondary | ICD-10-CM | POA: Insufficient documentation

## 2013-11-26 DIAGNOSIS — E1165 Type 2 diabetes mellitus with hyperglycemia: Secondary | ICD-10-CM

## 2013-11-26 DIAGNOSIS — Z23 Encounter for immunization: Secondary | ICD-10-CM

## 2013-11-26 DIAGNOSIS — I2510 Atherosclerotic heart disease of native coronary artery without angina pectoris: Secondary | ICD-10-CM | POA: Insufficient documentation

## 2013-11-26 LAB — TSH: TSH: 2.75 u[IU]/mL (ref 0.35–5.50)

## 2013-11-26 LAB — CBC WITH DIFFERENTIAL/PLATELET
BASOS PCT: 0.6 % (ref 0.0–3.0)
Basophils Absolute: 0 10*3/uL (ref 0.0–0.1)
Eosinophils Absolute: 0.4 10*3/uL (ref 0.0–0.7)
Eosinophils Relative: 4.9 % (ref 0.0–5.0)
HEMATOCRIT: 45.5 % (ref 39.0–52.0)
Hemoglobin: 15.2 g/dL (ref 13.0–17.0)
Lymphocytes Relative: 16.5 % (ref 12.0–46.0)
Lymphs Abs: 1.2 10*3/uL (ref 0.7–4.0)
MCHC: 33.3 g/dL (ref 30.0–36.0)
MCV: 95.6 fl (ref 78.0–100.0)
MONOS PCT: 9.5 % (ref 3.0–12.0)
Monocytes Absolute: 0.7 10*3/uL (ref 0.1–1.0)
Neutro Abs: 4.9 10*3/uL (ref 1.4–7.7)
Neutrophils Relative %: 68.5 % (ref 43.0–77.0)
PLATELETS: 171 10*3/uL (ref 150.0–400.0)
RBC: 4.76 Mil/uL (ref 4.22–5.81)
RDW: 13.4 % (ref 11.5–14.6)
WBC: 7.1 10*3/uL (ref 4.5–10.5)

## 2013-11-26 LAB — HEPATIC FUNCTION PANEL
ALBUMIN: 4.1 g/dL (ref 3.5–5.2)
ALT: 17 U/L (ref 0–53)
AST: 27 U/L (ref 0–37)
Alkaline Phosphatase: 54 U/L (ref 39–117)
Bilirubin, Direct: 0.2 mg/dL (ref 0.0–0.3)
Total Bilirubin: 0.8 mg/dL (ref 0.3–1.2)
Total Protein: 7.3 g/dL (ref 6.0–8.3)

## 2013-11-26 LAB — BASIC METABOLIC PANEL
BUN: 31 mg/dL — AB (ref 6–23)
CO2: 29 mEq/L (ref 19–32)
CREATININE: 2.6 mg/dL — AB (ref 0.4–1.5)
Calcium: 9.7 mg/dL (ref 8.4–10.5)
Chloride: 100 mEq/L (ref 96–112)
GFR: 25.2 mL/min — AB (ref >60.00)
Glucose, Bld: 105 mg/dL — ABNORMAL HIGH (ref 70–99)
Potassium: 4.1 mEq/L (ref 3.5–5.1)
Sodium: 137 mEq/L (ref 135–145)

## 2013-11-26 LAB — LIPID PANEL
CHOLESTEROL: 109 mg/dL (ref 0–200)
HDL: 39.5 mg/dL (ref >39.00)
LDL Cholesterol: 39 mg/dL (ref 0–99)
Total CHOL/HDL Ratio: 3
Triglycerides: 153 mg/dL — ABNORMAL HIGH (ref 0.0–149.0)
VLDL: 30.6 mg/dL (ref 0.0–40.0)

## 2013-11-26 LAB — URINALYSIS, ROUTINE W REFLEX MICROSCOPIC
Bilirubin Urine: NEGATIVE
Hgb urine dipstick: NEGATIVE
Ketones, ur: NEGATIVE
Leukocytes, UA: NEGATIVE
NITRITE: NEGATIVE
PH: 6 (ref 5.0–8.0)
RBC / HPF: NONE SEEN (ref 0–?)
SPECIFIC GRAVITY, URINE: 1.025 (ref 1.000–1.030)
Urine Glucose: NEGATIVE
Urobilinogen, UA: 0.2 (ref 0.0–1.0)
WBC, UA: NONE SEEN (ref 0–?)

## 2013-11-26 LAB — PROTIME-INR
INR: 1.1 ratio — ABNORMAL HIGH (ref 0.8–1.0)
Prothrombin Time: 11.4 s (ref 10.2–12.4)

## 2013-11-26 LAB — MICROALBUMIN / CREATININE URINE RATIO
Creatinine,U: 183.3 mg/dL
MICROALB UR: 3.5 mg/dL — AB (ref 0.0–1.9)
Microalb Creat Ratio: 1.9 mg/g (ref 0.0–30.0)

## 2013-11-26 LAB — HEMOGLOBIN A1C: Hgb A1c MFr Bld: 6.9 % — ABNORMAL HIGH (ref 4.6–6.5)

## 2013-11-26 MED ORDER — LANCETS MISC: Status: DC

## 2013-11-26 MED ORDER — GLUCOSE BLOOD VI STRP: ORAL_STRIP | Status: DC

## 2013-11-26 NOTE — Assessment & Plan Note (Signed)

## 2013-11-26 NOTE — Progress Notes (Signed)
Pre visit review using our clinic review tool, if applicable. No additional management support is needed unless otherwise documented below in the visit note. 

## 2013-11-26 NOTE — Assessment & Plan Note (Signed)
For PPM battery change soon, for pt/inr

## 2013-11-26 NOTE — Addendum Note (Signed)
Addended by: Scharlene GlossEWING, ROBIN B on: 11/26/2013 11:49 AM   Modules accepted: Orders

## 2013-11-26 NOTE — Patient Instructions (Addendum)
You had the new Prevnar pneumonia shot today  You should try the OTC ear wax removal kit for both ears  You are given the new glucometer and prescription for strips and lancets were sent to [facility]  Please continue all other medications as before, and refills have been done if requested, except you should hold the plavix as you are starting to do today to prepare for the coming procedure  Please have the pharmacy call with any other refills you may need.  Please continue your efforts at being more active, low cholesterol diet, and weight control.  Please keep your appointments with your specialists as you have planned - the new Battery  Please go to the LAB in the Basement (turn left off the elevator) for the tests to be done today You will be contacted by phone if any changes need to be made immediately.  Otherwise, you will receive a letter about your results with an explanation, but please check with MyChart first.  Please return in 6 months, or sooner if needed

## 2013-11-26 NOTE — Progress Notes (Signed)
Subjective:    Patient ID: Kent Nolan, male    DOB: 10-26-1928, 80 y.o.   MRN: [id number]  HPI  Here for wellness and to transfer PCP care;  Overall doing ok;  Pt denies CP, worsening SOB, DOE, wheezing, orthopnea, PND, worsening LE edema, palpitations, dizziness or syncope.  Pt denies neurological change such as new headache, facial or extremity weakness.  Pt denies polydipsia, polyuria, or low sugar symptoms. Pt states overall good compliance with treatment and medications, good tolerability, and has been trying to follow lower cholesterol diet.  Pt denies worsening depressive symptoms, suicidal ideation or panic. No fever, night sweats, wt loss, loss of appetite, or other constitutional symptoms.  Pt states good ability with ADL's, has low to mod fall risk, home safety reviewed and adequate, no other significant changes in hearing or vision, and only occasionally active with exercise. For PPM battery replacement later this month, needs labs today.  Pt continues to have recurring bilat LBP without change in severity but starts hurting consistently with standing more than 10 min, no bowel or bladder change, fever, wt loss,  worsening LE pain/numbness/weakness, gait change or falls. Walks with cane. Has walker at home  Needs new glucometer today and strips. Past Medical History  Diagnosis Date  . Coronary artery disease     remote subendo MI in February 2005 with stents to RCA &LCX; s/p scute thrombosis of both stents in February 2010 with cardiogenic shock - s/p BMS to both lesions; follow up cath in September 2010 was satisfactory.  He is medically managed with aspirin/Plavix  . PAF (paroxysmal atrial fibrillation)     s/p cardioversion in August 2011  . Diabetes mellitus   . GERD (gastroesophageal reflux disease)   . Hyperlipidemia   . Chronic kidney disease     followed by nephrology  . Pacemaker   . HTN (hypertension)   . Obesity   . High risk medication use     on amiodarone for  PAF/VT since February 2010   Past Surgical History  Procedure Laterality Date  . Pacemaker insertion  May 2011    reports that he has never smoked. He does not have any smokeless tobacco history on file. He reports that he does not drink alcohol or use illicit drugs. family history includes Heart disease in his mother. Allergies  Allergen Reactions  . Penicillins Other (See Comments)    Reaction unknown  . Sulfonamide Derivatives Other (See Comments)    Reaction unknown   Current Outpatient Prescriptions on File Prior to Visit  Medication Sig Dispense Refill  . acetaminophen (TYLENOL) 500 MG tablet Take 500 mg by mouth every 6 (six) hours as needed. For pain       . amiodarone (PACERONE) 200 MG tablet Take 200 mg by mouth daily.      Marland Kitchen aspirin 81 MG tablet Take 81 mg by mouth daily.        . clopidogrel (PLAVIX) 75 MG tablet Take 75 mg by mouth daily with breakfast.      . famotidine (PEPCID) 40 MG tablet Take 40 mg by mouth daily.      . fenofibrate (TRICOR) 145 MG tablet Take 145 mg by mouth daily.      . fish oil-omega-3 fatty acids 1000 MG capsule Take 2 g by mouth daily.      . furosemide (LASIX) 40 MG tablet Take 0.5 tablets (20 mg total) by mouth daily.  90 tablet  3  . Multiple Vitamin (  MULTIVITAMIN) tablet Take 1 tablet by mouth daily.        . nitroGLYCERIN (NITROSTAT) 0.4 MG SL tablet Place 1 tablet (0.4 mg total) under the tongue every 5 (five) minutes as needed. For chest pain  25 tablet  5  . rosuvastatin (CRESTOR) 20 MG tablet Take 20 mg by mouth daily.      . sitaGLIPtin (JANUVIA) 50 MG tablet Take 50 mg by mouth daily.       No current facility-administered medications on file prior to visit.    Review of Systems Constitutional: Negative for diaphoresis, activity change, appetite change or unexpected weight change.  HENT: Negative for hearing loss, ear pain, facial swelling, mouth sores and neck stiffness.   Eyes: Negative for pain, redness and visual disturbance.   Respiratory: Negative for shortness of breath and wheezing.   Cardiovascular: Negative for chest pain and palpitations.  Gastrointestinal: Negative for diarrhea, blood in stool, abdominal distention or other pain Genitourinary: Negative for hematuria, flank pain or change in urine volume.  Musculoskeletal: Negative for myalgias and joint swelling.  Skin: Negative for color change and wound.  Neurological: Negative for syncope and numbness. other than noted Hematological: Negative for adenopathy.  Psychiatric/Behavioral: Negative for hallucinations, self-injury, decreased concentration and agitation.      Objective:   Physical Exam BP 112/70  Pulse 68  Temp(Src) 97.4 F (36.3 C) (Oral)  Wt 199 lb (90.266 kg)  SpO2 91% VS noted,  Constitutional: Pt is oriented to person, place, and time. Appears well-developed and well-nourished.  Head: Normocephalic and atraumatic.  Right Ear: External ear normal.  Left Ear: External ear normal.  Nose: Nose normal.  Mouth/Throat: Oropharynx is clear and moist Has bilat significant wax.  Eyes: Conjunctivae and EOM are normal. Pupils are equal, round, and reactive to light.  Neck: Normal range of motion. Neck supple. No JVD present. No tracheal deviation present.  Cardiovascular: Normal rate, regular rhythm, normal heart sounds and intact distal pulses.   Pulmonary/Chest: Effort normal and breath sounds normal.  Abdominal: Soft. Bowel sounds are normal. There is no tenderness. No HSM  Musculoskeletal: Normal range of motion. Exhibits no edema.  Lymphadenopathy:  Has no cervical adenopathy.  Neurological: Pt is alert and oriented to person, place, and time. Pt has normal reflexes. No cranial nerve deficit.  Skin: Skin is warm and dry. No rash noted.  Psychiatric:  Has  normal mood and affect. Behavior is normal.     Assessment & Plan:

## 2013-11-26 NOTE — Addendum Note (Signed)
Addended by: Corwin LevinsJOHN, Jaidynn Balster W on: 11/26/2013 11:16 AM   Modules accepted: Level of Service

## 2013-11-28 ENCOUNTER — Ambulatory Visit: Payer: Medicare Other | Admitting: Internal Medicine

## 2013-12-02 MED ORDER — VANCOMYCIN HCL IN DEXTROSE 1-5 GM/200ML-% IV SOLN
1000.0000 mg | INTRAVENOUS | Status: AC
  Filled 2013-12-02: qty 200

## 2013-12-02 MED ORDER — SODIUM CHLORIDE 0.9 % IR SOLN
80.0000 mg | Status: AC
  Filled 2013-12-02: qty 2

## 2013-12-03 ENCOUNTER — Ambulatory Visit (HOSPITAL_COMMUNITY)
Admission: RE | Admit: 2013-12-03 | Discharge: 2013-12-03 | Disposition: A | Discharge status: Home / Self Care | Payer: Medicare HMO | Source: Ambulatory Visit | Attending: Internal Medicine | Admitting: Internal Medicine

## 2013-12-03 ENCOUNTER — Encounter (HOSPITAL_COMMUNITY)
Admission: RE | Disposition: A | Discharge status: Home / Self Care | Payer: Self-pay | Source: Ambulatory Visit | Attending: Internal Medicine

## 2013-12-03 DIAGNOSIS — I4891 Unspecified atrial fibrillation: Secondary | ICD-10-CM | POA: Insufficient documentation

## 2013-12-03 DIAGNOSIS — E669 Obesity, unspecified: Secondary | ICD-10-CM | POA: Insufficient documentation

## 2013-12-03 DIAGNOSIS — I495 Sick sinus syndrome: Secondary | ICD-10-CM | POA: Insufficient documentation

## 2013-12-03 DIAGNOSIS — Z9861 Coronary angioplasty status: Secondary | ICD-10-CM | POA: Insufficient documentation

## 2013-12-03 DIAGNOSIS — N189 Chronic kidney disease, unspecified: Secondary | ICD-10-CM | POA: Insufficient documentation

## 2013-12-03 DIAGNOSIS — Z7902 Long term (current) use of antithrombotics/antiplatelets: Secondary | ICD-10-CM | POA: Insufficient documentation

## 2013-12-03 DIAGNOSIS — Z950 Presence of cardiac pacemaker: Secondary | ICD-10-CM | POA: Diagnosis present

## 2013-12-03 DIAGNOSIS — I252 Old myocardial infarction: Secondary | ICD-10-CM | POA: Insufficient documentation

## 2013-12-03 DIAGNOSIS — Z7982 Long term (current) use of aspirin: Secondary | ICD-10-CM | POA: Insufficient documentation

## 2013-12-03 DIAGNOSIS — Z45018 Encounter for adjustment and management of other part of cardiac pacemaker: Secondary | ICD-10-CM | POA: Insufficient documentation

## 2013-12-03 DIAGNOSIS — E785 Hyperlipidemia, unspecified: Secondary | ICD-10-CM | POA: Insufficient documentation

## 2013-12-03 DIAGNOSIS — E119 Type 2 diabetes mellitus without complications: Secondary | ICD-10-CM | POA: Insufficient documentation

## 2013-12-03 DIAGNOSIS — I2510 Atherosclerotic heart disease of native coronary artery without angina pectoris: Secondary | ICD-10-CM | POA: Insufficient documentation

## 2013-12-03 DIAGNOSIS — I129 Hypertensive chronic kidney disease with stage 1 through stage 4 chronic kidney disease, or unspecified chronic kidney disease: Secondary | ICD-10-CM | POA: Insufficient documentation

## 2013-12-03 DIAGNOSIS — K219 Gastro-esophageal reflux disease without esophagitis: Secondary | ICD-10-CM | POA: Insufficient documentation

## 2013-12-03 HISTORY — PX: PACEMAKER GENERATOR CHANGE: SHX5998

## 2013-12-03 HISTORY — PX: PERMANENT PACEMAKER GENERATOR CHANGE: SHX6022

## 2013-12-03 LAB — GLUCOSE, CAPILLARY
GLUCOSE-CAPILLARY: 119 mg/dL — AB (ref 70–99)
Glucose-Capillary: 101 mg/dL — ABNORMAL HIGH (ref 70–99)

## 2013-12-03 LAB — SURGICAL PCR SCREEN
MRSA, PCR: NEGATIVE
STAPHYLOCOCCUS AUREUS: NEGATIVE

## 2013-12-03 SURGERY — PERMANENT PACEMAKER GENERATOR CHANGE
Anesthesia: LOCAL

## 2013-12-03 MED ORDER — HYDROCODONE-ACETAMINOPHEN 5-325 MG PO TABS: 1.0000 | ORAL_TABLET | ORAL | Status: DC | PRN

## 2013-12-03 MED ORDER — SODIUM CHLORIDE 0.9 % IV SOLN
INTRAVENOUS | Status: DC
  Administered 2013-12-03: 11:00:00 via INTRAVENOUS

## 2013-12-03 MED ORDER — SODIUM CHLORIDE 0.9 % IJ SOLN: 3.0000 mL | Freq: Two times a day (BID) | INTRAMUSCULAR | Status: DC

## 2013-12-03 MED ORDER — ACETAMINOPHEN 325 MG PO TABS
325.0000 mg | ORAL_TABLET | ORAL | Status: DC | PRN
  Filled 2013-12-03: qty 2

## 2013-12-03 MED ORDER — SODIUM CHLORIDE 0.9 % IJ SOLN: 3.0000 mL | INTRAMUSCULAR | Status: DC | PRN

## 2013-12-03 MED ORDER — ONDANSETRON HCL 4 MG/2ML IJ SOLN: 4.0000 mg | Freq: Four times a day (QID) | INTRAMUSCULAR | Status: DC | PRN

## 2013-12-03 MED ORDER — MUPIROCIN 2 % EX OINT
TOPICAL_OINTMENT | Freq: Two times a day (BID) | CUTANEOUS | Status: DC
  Filled 2013-12-03: qty 22

## 2013-12-03 MED ORDER — SODIUM CHLORIDE 0.9 % IV SOLN: 250.0000 mL | INTRAVENOUS | Status: DC | PRN

## 2013-12-03 MED ORDER — LIDOCAINE HCL (PF) 1 % IJ SOLN
INTRAMUSCULAR | Status: AC
  Filled 2013-12-03: qty 60

## 2013-12-03 MED ORDER — CHLORHEXIDINE GLUCONATE 4 % EX LIQD
60.0000 mL | Freq: Once | CUTANEOUS | Status: DC
  Filled 2013-12-03: qty 60

## 2013-12-03 MED ORDER — MUPIROCIN 2 % EX OINT
TOPICAL_OINTMENT | CUTANEOUS | Status: AC
  Administered 2013-12-03: 1
  Filled 2013-12-03: qty 22

## 2013-12-03 NOTE — H&P (View-Only) (Signed)
PCP:  Kent BarreJames John, MD  The patient presents today for routine electrophysiology followup.  Since last being seen in our clinic, the patient reports doing reasonably well.  He has occasional SOB.  He also has postural dizziness. Today, he denies symptoms of palpitations, chest pain, orthopnea, PND, lower extremity edema,  presyncope, syncope, or neurologic sequela.  The patient feels that he is tolerating medications without difficulties and is otherwise without complaint today.  He has reached ERI battery status.  Past Medical History  Diagnosis Date  . Coronary artery disease     remote subendo MI in February 2005 with stents to RCA &LCX; s/p scute thrombosis of both stents in February 2010 with cardiogenic shock - s/p BMS to both lesions; follow up cath in September 2010 was satisfactory.  He is medically managed with aspirin/Plavix  . PAF (paroxysmal atrial fibrillation)     s/p cardioversion in August 2011  . Diabetes mellitus   . GERD (gastroesophageal reflux disease)   . Hyperlipidemia   . Chronic kidney disease     followed by nephrology  . Pacemaker   . HTN (hypertension)   . Obesity   . High risk medication use     on amiodarone for PAF/VT since February 2010   Past Surgical History  Procedure Laterality Date  . Pacemaker insertion  May 2011    Current Outpatient Prescriptions  Medication Sig Dispense Refill  . acetaminophen (TYLENOL) 500 MG tablet Take 500 mg by mouth every 6 (six) hours as needed. For pain       . amiodarone (PACERONE) 200 MG tablet TAKE 1 TABLET BY MOUTH EVERY DAY  30 tablet  3  . aspirin 81 MG tablet Take 81 mg by mouth daily.        . clopidogrel (PLAVIX) 75 MG tablet TAKE 1 TABLET BY MOUTH EVERY DAY  30 tablet  1  . CRESTOR 20 MG tablet TAKE 1 TABLET BY MOUTH EVERY DAY  30 tablet  6  . famotidine (PEPCID) 40 MG tablet TAKE 1 TABLET BY MOUTH EVERY DAY  30 tablet  3  . fenofibrate (TRICOR) 145 MG tablet TAKE 1 TABLET BY MOUTH EVERY DAY  30 tablet  11  .  fish oil-omega-3 fatty acids 1000 MG capsule Take 2 g by mouth daily.      . furosemide (LASIX) 40 MG tablet Take 0.5 tablets (20 mg total) by mouth daily.  90 tablet  3  . Multiple Vitamin (MULTIVITAMIN) tablet Take 1 tablet by mouth daily.        . nitroGLYCERIN (NITROSTAT) 0.4 MG SL tablet Place 1 tablet (0.4 mg total) under the tongue every 5 (five) minutes as needed. For chest pain  25 tablet  5  . sitaGLIPtin (JANUVIA) 50 MG tablet Take 50 mg by mouth daily.       No current facility-administered medications for this visit.    Allergies  Allergen Reactions  . Penicillins   . Sulfonamide Derivatives     History   Social History  . Marital Status: Married    Spouse Name: N/A    Number of Children: N/A  . Years of Education: N/A   Occupational History  . Not on file.   Social History Main Topics  . Smoking status: Never Smoker   . Smokeless tobacco: Not on file  . Alcohol Use: No  . Drug Use: No  . Sexual Activity: No   Other Topics Concern  . Not on file   Social  History Narrative  . No narrative on file    Physical Exam: Filed Vitals:   11/11/13 1612  BP: 118/72  Pulse: 65  Height: 5\' 7"  (1.702 m)  Weight: 198 lb 3.2 oz (89.903 kg)    GEN- The patient is elderly appearing, alert and oriented x 3 today.   Head- normocephalic, atraumatic Eyes-  Sclera clear, conjunctiva pink Ears- hearing intact Oropharynx- clear Neck- supple, no JVP Lymph- no cervical lymphadenopathy Lungs- Clear to ausculation bilaterally, normal work of breathing Chest- R sided pacemaker pocket is well healed Heart- Regular rate and rhythm, no murmurs, rubs or gallops, PMI not laterally displaced GI- soft, NT, ND, + BS Extremities- no clubbing, cyanosis, or edema  Pacemaker interrogation- reviewed in detail today,  See PACEART report ekg today reveals V pacing at 65 bpm with VA dissociation  Assessment and Plan:   1. Sinus node dysfunction The patient has reached ERI battery  status.  This is premature and likely due to his MDT Enrhythm battery.   He does have a high atrial pacing threshold.  Given his advanced age, I would like to avoid lead revision if possible. He only A paces 30%.  With a new device it may be that I will be able to use unipolar pacing to avoid lead revision.  Risks, benefits, and alternatives to PPM generator change with possible lead revision were discussed at length with the patient and his family who wish to proceed.  We will schedule the procedure at the next available time.  2. afib No afib since last interrogation He is on ASA and Plavix for afib.  He remains convinced that he should stay on plavix rather than consider a novel anticoagulation despite a long discussion by me.  We will hold plavix for 7 days prior to generator change.  3. CAD No ischemic symptoms

## 2013-12-03 NOTE — Op Note (Signed)
SURGEON:  Hillis RangeJames Georgio Hattabaugh, MD     PREPROCEDURE DIAGNOSES:   1. Sick sinus syndrome.     POSTPROCEDURE DIAGNOSES:   1. Sick sinus syndrome.     PROCEDURES:   1. Pacemaker pulse generator replacement.   2. Skin pocket revision.     INTRODUCTION:  Kent Nolan is a 80 y.o. male with a history of sick sinus syndrome. He has done well since his pacemaker was implanted.  He has recently reached ERI battery status.  He presents today for pacemaker pulse generator replacement.       DESCRIPTION OF THE PROCEDURE:  Informed written consent was obtained, and the patient was brought to the electrophysiology lab in the fasting state.  The patient's pacemaker was interrogated today and found to be at elective replacement indicator battery status.  The patient required no sedation for the procedure today.  The patient's right chest was prepped and draped in the usual sterile fashion by the EP lab staff.  The skin overlying the existing pacemaker was infiltrated with lidocaine for local analgesia.  A 4-cm incision was made over the pacemaker pocket.  Using a combination of sharp and blunt dissection, the pacemaker was exposed and removed from the body.  The device was disconnected from the leads. A single silk suture was identified and removed which had secured the device to the pectoralis fascia.  There was no foreign matter or debris within the pocket.  The atrial lead was confirmed to be a Insurance claims handlerBoston Scientific model 4469 (serial number [id number]) lead implanted on 02/02/2010.  The right ventricular lead was confirmed to be a  Tax adviser[employer] Scientific model 4470  (serial number [id number]) lead implanted on the same date as the atrial lead (above).  Both leads were examined and their integrity was confirmed to be intact.  Atrial lead P-waves measured 2.5 mV with impedance of 432 ohms and a threshold of 1.0 V at 0.5 msec.  Right ventricular lead R-waves measured 6 mV with impedance of 365 ohms and a threshold of 0.7 V at 0.5 msec.   Both leads were connected to a Medtronic adapt L model ADDDR 1 (serial number [id number]) pacemaker.  The pocket was revised to accommodate this new device.  Electrocautery was required to assure hemostasis.  The pocket was irrigated with copious gentamicin solution. The pacemaker was then placed into the pocket.  The pocket was then closed in 2 layers with 2-0 Vicryl suture over the subcutaneous and subcuticular layers.  Steri-Strips and a sterile dressing were then applied.  There were no early apparent complications.     CONCLUSIONS:   1. Successful pacemaker pulse generator replacement for elective replacement indicator battery status   2. No early apparent complications.     Hillis RangeJames Xyler Terpening, MD 12/03/2013 4:32 PM

## 2013-12-03 NOTE — Discharge Instructions (Signed)
Pacemaker Battery Change, Care After  °Refer to this sheet in the next few weeks. These instructions provide you with information on caring for yourself after your procedure. Your health care provider may also give you more specific instructions. Your treatment has been planned according to current medical practices, but problems sometimes occur. Call your health care provider if you have any problems or questions after your procedure. °WHAT TO EXPECT AFTER THE PROCEDURE °After your procedure, it is typical to have the following sensations: °· Soreness at the pacemaker site. °HOME CARE INSTRUCTIONS  °· Keep the incision clean and dry. °· Unless advised otherwise, you may shower beginning 48 hours after your procedure. °· For the first week after the replacement, avoid stretching motions that pull at the incision site and avoid heavy exercise with the arm on the same side as the incision. °· Only take over-the-counter or prescription medicines for pain, discomfort, or fever as directed by your health care provider. °· Your health care provider will tell you when you will need to next test your pacemaker by telephone or when to return to the office for follow up for removal of stitches. °SEEK MEDICAL CARE IF:  °· You have pain at the incision site that is not relieved by over-the-counter or prescription medicine. °· There is drainage or pus from the incision site. °· There is swelling larger than a lime at the incision site. °· You develop red streaking that extends above or below the incision site. °· You feel brief, intermittent palpitations, lightheadedness, or any symptoms that you feel might be related to your heart. °SEEK IMMEDIATE MEDICAL CARE IF:  °· You experience chest pain that is different than the pain at the pacemaker site. °· Shortness of breath. °· Palpitations or irregular heart beat. °· Lightheadedness that does not go away quickly. °· Fainting. °· You have pain that gets worse and is not relieved by  medicine. °MAKE SURE YOU:  °· Understand these instructions. °· Will watch your condition. °· Will get help right away if you are not doing well or get worse. °Document Released: 06/12/2013 Document Reviewed: 03/06/2013 °Exit[employer]® Patient Information ©2014 [employer]. ° °

## 2013-12-03 NOTE — Interval H&P Note (Signed)
History and Physical Interval Note:  12/03/2013 12:38 PM  Kent GammaWilliam P Kovar  has presented today for surgery, with the diagnosis of ERI/Afib  The various methods of treatment have been discussed with the patient and family. After consideration of risks, benefits and other options for treatment, the patient has consented to  Procedure(s): PERMANENT PACEMAKER GENERATOR CHANGE (N/A) with possible lead revision as a surgical intervention .  The patient's history has been reviewed, patient examined, no change in status, stable for surgery.  I have reviewed the patient's chart and labs.  Questions were answered to the patient's satisfaction.     Hillis RangeJames Deshonda Cryderman

## 2013-12-04 ENCOUNTER — Other Ambulatory Visit: Payer: Self-pay | Admitting: Internal Medicine

## 2013-12-06 ENCOUNTER — Other Ambulatory Visit: Payer: Self-pay | Admitting: Internal Medicine

## 2013-12-17 ENCOUNTER — Encounter: Payer: Self-pay | Admitting: Internal Medicine

## 2013-12-17 ENCOUNTER — Encounter: Payer: Self-pay | Admitting: Nurse Practitioner

## 2013-12-17 ENCOUNTER — Ambulatory Visit (INDEPENDENT_AMBULATORY_CARE_PROVIDER_SITE_OTHER): Payer: Medicare HMO | Admitting: *Deleted

## 2013-12-17 ENCOUNTER — Ambulatory Visit (INDEPENDENT_AMBULATORY_CARE_PROVIDER_SITE_OTHER): Payer: Medicare HMO | Admitting: Nurse Practitioner

## 2013-12-17 VITALS — BP 100/80 | HR 70 | Ht 67.0 in | Wt 198.4 lb

## 2013-12-17 DIAGNOSIS — I10 Essential (primary) hypertension: Secondary | ICD-10-CM

## 2013-12-17 DIAGNOSIS — I498 Other specified cardiac arrhythmias: Secondary | ICD-10-CM

## 2013-12-17 DIAGNOSIS — Z45018 Encounter for adjustment and management of other part of cardiac pacemaker: Secondary | ICD-10-CM

## 2013-12-17 DIAGNOSIS — Z950 Presence of cardiac pacemaker: Secondary | ICD-10-CM

## 2013-12-17 DIAGNOSIS — I259 Chronic ischemic heart disease, unspecified: Secondary | ICD-10-CM

## 2013-12-17 LAB — MDC_IDC_ENUM_SESS_TYPE_INCLINIC
Battery Impedance: 100 Ohm
Battery Voltage: 2.8 V
Brady Statistic AP VS Percent: 5 %
Brady Statistic AS VP Percent: 38 %
Lead Channel Pacing Threshold Amplitude: 1.75 V
Lead Channel Pacing Threshold Pulse Width: 0.4 ms
Lead Channel Sensing Intrinsic Amplitude: 1 mV
Lead Channel Setting Pacing Amplitude: 2 V
Lead Channel Setting Pacing Pulse Width: 0.4 ms
Lead Channel Setting Sensing Sensitivity: 2 mV
MDC IDC MSMT BATTERY REMAINING LONGEVITY: 125 mo
MDC IDC MSMT LEADCHNL RA IMPEDANCE VALUE: 534 Ohm
MDC IDC MSMT LEADCHNL RA PACING THRESHOLD PULSEWIDTH: 0.4 ms
MDC IDC MSMT LEADCHNL RV IMPEDANCE VALUE: 404 Ohm
MDC IDC MSMT LEADCHNL RV PACING THRESHOLD AMPLITUDE: 0.75 V
MDC IDC SESS DTM: 20150414085113
MDC IDC SET LEADCHNL RA PACING AMPLITUDE: 3.5 V
MDC IDC STAT BRADY AP VP PERCENT: 48 %
MDC IDC STAT BRADY AS VS PERCENT: 8 %

## 2013-12-17 NOTE — Patient Instructions (Signed)
I think you are doing well  Stay on your current medicines  I will see you in 3 months  Call the [facility] office at [phone] if you have any questions, problems or concerns.

## 2013-12-17 NOTE — Progress Notes (Signed)
Kent Nolan Date of Birth: May 23, 1929 Medical Record #[id number]  History of Present Illness: [name] is seen back today for a follow up/post hospital check. Seen for Dr. Johney FrameAllred. Former patient of Dr. Ronnald Nianennant's. He has known ischemic heart disease with prior subendocardial MI in February of 2005 with stents to the RCA and LCX. He experienced acute thrombosis of both stents in February of 2010 that was associated with cardiogenic shock and had repeat stenting to both lesions at that time with non drug eluting stents placed. Follow up cath in September of 2010 was satisfactory. He is on chronic Plavix.   Other problems include DM, HTN, GERD, ACE related cough, HLD, PAF and VT back in 2010 and on chronic amiodarone therapy, CRI and remote UTIs. He does have an underlying pacemaker in place since 2011 and has had prior cardioversion for his atrial fib back in August of 2011. While he does have an elevated CHADS score, he has been maintained on chronic Plavix and aspirin therapy in light of his stent thrombosis - he has declined anticoagulation with coumadin/NOVAC - has had no recurrent atrial fib. EF by echo from 2010 was 50 to 55%. Last generator change was in March of 2015.   I saw him back in February - he was dizzy. Had "fallen out" several times since he was here - no significant injury yet. We did extensive testing at that time which included Myoview, echo, CT etc. Felt to have some degree of orthostasis. Has also had his generator replaced at the end of March per Dr. Johney FrameAllred.  Comes in today. Here with his wife. Doing ok.  Breathing is stable. No chest pain. Some dizziness but overall he is stable. No falls. Tolerating his medicines. No fever or chills. Still with his dressing over his pacer site.   Current Outpatient Prescriptions  Medication Sig Dispense Refill  . acetaminophen (TYLENOL) 500 MG tablet Take 500 mg by mouth every 6 (six) hours as needed. For pain       . amiodarone  (PACERONE) 200 MG tablet TAKE 1 TABLET BY MOUTH EVERY DAY  30 tablet  3  . aspirin 81 MG tablet Take 81 mg by mouth daily.        . clopidogrel (PLAVIX) 75 MG tablet TAKE 1 TABLET BY MOUTH EVERY DAY  30 tablet  1  . famotidine (PEPCID) 40 MG tablet Take 40 mg by mouth daily.      . fenofibrate (TRICOR) 145 MG tablet Take 145 mg by mouth daily.      . furosemide (LASIX) 40 MG tablet Take 0.5 tablets (20 mg total) by mouth daily.  15 tablet  3  . glucose blood test strip Use as instructed  100 each  12  . Lancets MISC Use as directed 1 per day  250.02  100 each  11  . Multiple Vitamin (MULTIVITAMIN) tablet Take 1 tablet by mouth daily.        . rosuvastatin (CRESTOR) 20 MG tablet Take 20 mg by mouth daily.      . sitaGLIPtin (JANUVIA) 50 MG tablet Take 50 mg by mouth daily.      . nitroGLYCERIN (NITROSTAT) 0.4 MG SL tablet Place 1 tablet (0.4 mg total) under the tongue every 5 (five) minutes as needed. For chest pain  25 tablet  5   No current facility-administered medications for this visit.    Allergies  Allergen Reactions  . Penicillins Other (See Comments)    Reaction unknown  .  Sulfonamide Derivatives Other (See Comments)    Reaction unknown    Past Medical History  Diagnosis Date  . Coronary artery disease     remote subendo MI in February 2005 with stents to RCA &LCX; s/p scute thrombosis of both stents in February 2010 with cardiogenic shock - s/p BMS to both lesions; follow up cath in September 2010 was satisfactory.  He is medically managed with aspirin/Plavix  . PAF (paroxysmal atrial fibrillation)     s/p cardioversion in August 2011  . GERD (gastroesophageal reflux disease)   . Hyperlipidemia   . Chronic kidney disease     followed by nephrology  . Pacemaker   . HTN (hypertension)   . Obesity   . High risk medication use     on amiodarone for PAF/VT since February 2010  . Essential hypertension, benign 08/04/2010    Qualifier: Diagnosis of  By: Johney FrameAllred, MD, Fayrene FearingJames      . CAD 08/04/2010    Qualifier: Diagnosis of  By: Johney FrameAllred, MD, Fayrene FearingJames    . BRADYCARDIA 08/04/2010    Qualifier: Diagnosis of  By: Johney FrameAllred, MD, Fayrene FearingJames    . Atrial fibrillation 08/04/2010    Qualifier: Diagnosis of  By: Johney FrameAllred, MD, Fayrene FearingJames    . Type II or unspecified type diabetes mellitus without mention of complication, uncontrolled     Past Surgical History  Procedure Laterality Date  . Pacemaker insertion  May 2011    History  Smoking status  . Never Smoker   Smokeless tobacco  . Not on file    History  Alcohol Use No    Family History  Problem Relation Age of Onset  . Heart disease Mother     Review of Systems: The review of systems is per the HPI.  All other systems were reviewed and are negative.  Physical Exam: BP 100/80  Pulse 70  Ht 5\' 7"  (1.702 m)  Wt 198 lb 6.4 oz (89.994 kg)  BMI 31.07 kg/m2  SpO2 98% BP 110/80 Patient is very pleasant and in no acute distress. He is obese. Weight is up 5 pounds. Skin is warm and dry. Color is normal.  HEENT is unremarkable. Normocephalic/atraumatic. PERRL. Sclera are nonicteric. Neck is supple. No masses. No JVD. Lungs are clear. Cardiac exam shows a regular rate and rhythm. Dressing over the pacemaker is removed - steri strips in place. Looks good.  Abdomen is soft. Extremities are without edema. Has his support stockings on. Gait and ROM are intact. Using a cane. No gross neurologic deficits noted.  Wt Readings from Last 3 Encounters:  12/17/13 198 lb 6.4 oz (89.994 kg)  12/03/13 193 lb (87.544 kg)  12/03/13 193 lb (87.544 kg)     LABORATORY DATA: Lab Results  Component Value Date   WBC 7.1 11/26/2013   HGB 15.2 11/26/2013   HCT 45.5 11/26/2013   PLT 171.0 11/26/2013   GLUCOSE 105* 11/26/2013   CHOL 109 11/26/2013   TRIG 153.0* 11/26/2013   HDL 39.50 11/26/2013   LDLDIRECT 45.4 07/23/2012   LDLCALC 39 11/26/2013   ALT 17 11/26/2013   AST 27 11/26/2013   NA 137 11/26/2013   K 4.1 11/26/2013   CL 100 11/26/2013    CREATININE 2.6* 11/26/2013   BUN 31* 11/26/2013   CO2 29 11/26/2013   TSH 2.75 11/26/2013   INR 1.1* 11/26/2013   HGBA1C 6.9* 11/26/2013   MICROALBUR 3.5* 11/26/2013   Echo Study Conclusions from February 2015  - Left ventricle: The cavity size  was normal. Systolic function was mildly reduced. The estimated ejection fraction was in the range of 45% to 50%. There was an increased relative contribution of atrial contraction to ventricular filling. Doppler parameters are consistent with abnormal left ventricular relaxation (grade 1 diastolic dysfunction). - Aortic valve: Moderate thickening and calcification, consistent with sclerosis. - Aorta: Aortic root dimension: 38mm (ED). - Ascending aorta: The ascending aorta was mildly dilated. - Mitral valve: Moderate regurgitation. Valve area by pressure half-time: 1.86cm^2.  Myoview Overall Impression:  Intermediate risk stress nuclear study with  large (35% of myocardium) mostly fixed inferolateral defect  suggesting of LCx territory scar. No significant reversible  ischemia.  LV Wall Motion: LVEF 47%, with inferolateral akinesis  Chrystie Nose, MD, [facility] Board Certified in Nuclear Cardiology Attending Cardiologist [facility]    Assessment / Plan:  1. Dizziness - stable.  2. CAD - stable - will manage medically. No symptoms.  3. Mild LV dysfunction - looks compensated.   4. Mild carotid disease   5. PAF - on amiodarone   6. Recent generator replacement - for device check this morning as well. I have removed the outer dressing.   7. HLD - given samples of Crestor  See him back in 3 months. Check labs on return.   Patient is agreeable to this plan and will call if any problems develop in the interim.   Rosalio Macadamia, RN, ANP-C  [facility]  [address]  [place], [place] [address]  [phone]

## 2013-12-17 NOTE — Progress Notes (Signed)
Wound check appointment for changeout. Steri-strips removed. Wound without redness or edema. Incision edges approximated, wound well healed. Normal device function. Thresholds, sensing, and impedances consistent with implant measurements. Device programmed at chronic lead settings---changed RA output from 4.0 to 3.5V. Histogram distribution appropriate for patient and level of activity. 369 mode switches---0.3% of time; 5 AHR---all EGMs show retrograde w/ VA time of 226-23665ms (PVARP on auto w/ 250min). No high ventricular rates noted. Patient educated about wound care, arm mobility, lifting restrictions. ROV w/ JA in 25mo.

## 2014-01-09 ENCOUNTER — Other Ambulatory Visit: Payer: Self-pay

## 2014-01-09 ENCOUNTER — Other Ambulatory Visit: Payer: Self-pay | Admitting: Internal Medicine

## 2014-01-09 MED ORDER — FAMOTIDINE 40 MG PO TABS: 40.0000 mg | ORAL_TABLET | Freq: Every day | ORAL | Status: DC

## 2014-01-29 ENCOUNTER — Encounter: Payer: Medicare HMO | Admitting: Internal Medicine

## 2014-02-03 ENCOUNTER — Other Ambulatory Visit: Payer: Self-pay | Admitting: Internal Medicine

## 2014-03-05 ENCOUNTER — Other Ambulatory Visit: Payer: Self-pay | Admitting: Internal Medicine

## 2014-03-18 ENCOUNTER — Ambulatory Visit (INDEPENDENT_AMBULATORY_CARE_PROVIDER_SITE_OTHER): Payer: Medicare HMO | Admitting: Nurse Practitioner

## 2014-03-18 ENCOUNTER — Encounter: Payer: Self-pay | Admitting: Nurse Practitioner

## 2014-03-18 VITALS — BP 140/90 | HR 79 | Ht 67.0 in | Wt 189.8 lb

## 2014-03-18 DIAGNOSIS — I2510 Atherosclerotic heart disease of native coronary artery without angina pectoris: Secondary | ICD-10-CM

## 2014-03-18 DIAGNOSIS — R42 Dizziness and giddiness: Secondary | ICD-10-CM

## 2014-03-18 DIAGNOSIS — Z950 Presence of cardiac pacemaker: Secondary | ICD-10-CM

## 2014-03-18 DIAGNOSIS — I4891 Unspecified atrial fibrillation: Secondary | ICD-10-CM

## 2014-03-18 DIAGNOSIS — I480 Paroxysmal atrial fibrillation: Secondary | ICD-10-CM

## 2014-03-18 DIAGNOSIS — I10 Essential (primary) hypertension: Secondary | ICD-10-CM

## 2014-03-18 LAB — BASIC METABOLIC PANEL
BUN: 30 mg/dL — ABNORMAL HIGH (ref 6–23)
CO2: 25 mEq/L (ref 19–32)
Calcium: 9.6 mg/dL (ref 8.4–10.5)
Chloride: 101 mEq/L (ref 96–112)
Creatinine, Ser: 2.4 mg/dL — ABNORMAL HIGH (ref 0.4–1.5)
GFR: 27.48 mL/min — ABNORMAL LOW (ref >60.00)
Glucose, Bld: 139 mg/dL — ABNORMAL HIGH (ref 70–99)
Potassium: 3.8 mEq/L (ref 3.5–5.1)
Sodium: 138 mEq/L (ref 135–145)

## 2014-03-18 LAB — LIPID PANEL
Cholesterol: 98 mg/dL (ref 0–200)
HDL: 32.4 mg/dL — ABNORMAL LOW (ref >39.00)
LDL Cholesterol: 34 mg/dL (ref 0–99)
NonHDL: 65.6
Total CHOL/HDL Ratio: 3
Triglycerides: 159 mg/dL — ABNORMAL HIGH (ref 0.0–149.0)
VLDL: 31.8 mg/dL (ref 0.0–40.0)

## 2014-03-18 LAB — CBC
HCT: 46.4 % (ref 39.0–52.0)
Hemoglobin: 15.6 g/dL (ref 13.0–17.0)
MCHC: 33.5 g/dL (ref 30.0–36.0)
MCV: 95.1 fl (ref 78.0–100.0)
Platelets: 184 10*3/uL (ref 150.0–400.0)
RBC: 4.89 Mil/uL (ref 4.22–5.81)
RDW: 13.6 % (ref 11.5–15.5)
WBC: 7.3 10*3/uL (ref 4.0–10.5)

## 2014-03-18 LAB — HEPATIC FUNCTION PANEL
ALT: 17 U/L (ref 0–53)
AST: 22 U/L (ref 0–37)
Albumin: 3.9 g/dL (ref 3.5–5.2)
Alkaline Phosphatase: 89 U/L (ref 39–117)
Bilirubin, Direct: 0 mg/dL (ref 0.0–0.3)
Total Bilirubin: 0.8 mg/dL (ref 0.2–1.2)
Total Protein: 7.6 g/dL (ref 6.0–8.3)

## 2014-03-18 LAB — TSH: TSH: 1.95 u[IU]/mL (ref 0.35–4.50)

## 2014-03-18 NOTE — Patient Instructions (Addendum)
Stay on your current medicines  See me in 6 months  Lets check labs today  Talk to Dr. Jonny RuizJohn about your back and possible options  Call the [facility] office at [phone] if you have any questions, problems or concerns.

## 2014-03-18 NOTE — Progress Notes (Signed)
Kent Nolan Date of Birth: 1928/12/07 Medical Record #[id number]  History of Present Illness: Kent Nolan is seen back today for a 3 month visit. Seen for Dr. Johney FrameAllred. Former patient of Dr. Ronnald Nianennant's. He has known ischemic heart disease with prior subendocardial MI in February of 2005 with stents to the RCA and LCX. He experienced acute thrombosis of both stents in February of 2010 that was associated with cardiogenic shock and had repeat stenting to both lesions at that time with non drug eluting stents placed. Follow up cath in September of 2010 was satisfactory. He is on chronic Plavix.   Other problems include DM, HTN, GERD, ACE related cough, HLD, PAF and VT back in 2010 and on chronic amiodarone therapy, CRI and remote UTIs. He does have an underlying pacemaker in place since 2011 and has had prior cardioversion for his atrial fib back in August of 2011. While he does have an elevated CHADS score, he has been maintained on chronic Plavix and aspirin therapy in light of his stent thrombosis - he has declined anticoagulation with coumadin/NOVAC - has had no recurrent atrial fib. EF by echo from 2010 was 50 to 55%. Last generator change was in March of 2015.   I saw him back in February - he was dizzy. Had "fallen out" several times since he was here - no significant injury yet. We did extensive testing at that time which included Myoview, echo, CT etc. Felt to have some degree of orthostasis. Has also had his generator replaced at the end of March per Dr. Johney FrameAllred.   Seen after his generator replacement back in April - he was doing well.  Comes in today. Here with his wife. Doing ok. He is mostly limited by his back. He complains of constant back pain - tylenol not helping. Pretty sedentary. Eating less and has lost weight. For pacemaker check. No chest pain. Breathing ok. Still with some dizzy spells. No falls. More unsteady on his feet. Not on any BP medicines. Remains on his plavix and  amiodarone. Rhythm ok.   Current Outpatient Prescriptions  Medication Sig Dispense Refill  . acetaminophen (TYLENOL) 500 MG tablet Take 500 mg by mouth every 6 (six) hours as needed. For pain       . amiodarone (PACERONE) 200 MG tablet TAKE 1 TABLET BY MOUTH EVERY DAY  30 tablet  3  . aspirin 81 MG tablet Take 81 mg by mouth daily.        . clopidogrel (PLAVIX) 75 MG tablet TAKE 1 TABLET BY MOUTH EVERY DAY  30 tablet  1  . famotidine (PEPCID) 40 MG tablet TAKE 1 TABLET BY MOUTH EVERY DAY  30 tablet  1  . fenofibrate (TRICOR) 145 MG tablet TAKE 1 TABLET BY MOUTH EVERY DAY  30 tablet  0  . furosemide (LASIX) 40 MG tablet Take 0.5 tablets (20 mg total) by mouth daily.  15 tablet  3  . glucose blood test strip Use as instructed  100 each  12  . Lancets MISC Use as directed 1 per day  250.02  100 each  11  . Multiple Vitamin (MULTIVITAMIN) tablet Take 1 tablet by mouth daily.        . nitroGLYCERIN (NITROSTAT) 0.4 MG SL tablet Place 1 tablet (0.4 mg total) under the tongue every 5 (five) minutes as needed. For chest pain  25 tablet  5  . rosuvastatin (CRESTOR) 20 MG tablet Take 20 mg by mouth daily.      .Marland Kitchen  sitaGLIPtin (JANUVIA) 50 MG tablet Take 50 mg by mouth daily.       No current facility-administered medications for this visit.    Allergies  Allergen Reactions  . Penicillins Other (See Comments)    Reaction unknown  . Sulfonamide Derivatives Other (See Comments)    Reaction unknown    Past Medical History  Diagnosis Date  . Coronary artery disease     remote subendo MI in February 2005 with stents to RCA &LCX; s/p scute thrombosis of both stents in February 2010 with cardiogenic shock - s/p BMS to both lesions; follow up cath in September 2010 was satisfactory.  He is medically managed with aspirin/Plavix  . PAF (paroxysmal atrial fibrillation)     s/p cardioversion in August 2011  . GERD (gastroesophageal reflux disease)   . Hyperlipidemia   . Chronic kidney disease      followed by nephrology  . Pacemaker   . HTN (hypertension)   . Obesity   . High risk medication use     on amiodarone for PAF/VT since February 2010  . BRADYCARDIA 08/04/2010    s/p PPM since 2011 - generator change 12/03/13  . Type II or unspecified type diabetes mellitus without mention of complication, uncontrolled     Past Surgical History  Procedure Laterality Date  . Pacemaker insertion  May 2011    History  Smoking status  . Never Smoker   Smokeless tobacco  . Not on file    History  Alcohol Use No    Family History  Problem Relation Age of Onset  . Heart disease Mother     Review of Systems: The review of systems is per the HPI.  All other systems were reviewed and are negative.  Physical Exam: BP 140/90  Pulse 79  Ht 5\' 7"  (1.702 m)  Wt 189 lb 12.8 oz (86.093 kg)  BMI 29.72 kg/m2  SpO2 100% Patient is very pleasant and in no acute distress. Looks chronically ill and getting frail. Skin is warm and dry. Color is normal. Arms pretty ecchymotic.  HEENT is unremarkable. Normocephalic/atraumatic. PERRL. Sclera are nonicteric. Neck is supple. No masses. No JVD. Lungs are clear. Cardiac exam shows a regular rate and rhythm. Abdomen is soft. Extremities are without edema. Gait and ROM are intact. No gross neurologic deficits noted.  Wt Readings from Last 3 Encounters:  03/18/14 189 lb 12.8 oz (86.093 kg)  12/17/13 198 lb 6.4 oz (89.994 kg)  12/03/13 193 lb (87.544 kg)    LABORATORY DATA/PROCEDURES: PENDING  Lab Results  Component Value Date   WBC 7.1 11/26/2013   HGB 15.2 11/26/2013   HCT 45.5 11/26/2013   PLT 171.0 11/26/2013   GLUCOSE 105* 11/26/2013   CHOL 109 11/26/2013   TRIG 153.0* 11/26/2013   HDL 39.50 11/26/2013   LDLDIRECT 45.4 07/23/2012   LDLCALC 39 11/26/2013   ALT 17 11/26/2013   AST 27 11/26/2013   NA 137 11/26/2013   K 4.1 11/26/2013   CL 100 11/26/2013   CREATININE 2.6* 11/26/2013   BUN 31* 11/26/2013   CO2 29 11/26/2013   TSH 2.75 11/26/2013    INR 1.1* 11/26/2013   HGBA1C 6.9* 11/26/2013   MICROALBUR 3.5* 11/26/2013    BNP (last 3 results) No results found for this basename: PROBNP,  in the last 8760 hours  Echo Study Conclusions from February 2015  - Left ventricle: The cavity size was normal. Systolic function was mildly reduced. The estimated ejection fraction was in the  range of 45% to 50%. There was an increased relative contribution of atrial contraction to ventricular filling. Doppler parameters are consistent with abnormal left ventricular relaxation (grade 1 diastolic dysfunction). - Aortic valve: Moderate thickening and calcification, consistent with sclerosis. - Aorta: Aortic root dimension: 38mm (ED). - Ascending aorta: The ascending aorta was mildly dilated. - Mitral valve: Moderate regurgitation. Valve area by pressure half-time: 1.86cm^2.  Myoview Overall Impression:  Intermediate risk stress nuclear study with  large (35% of myocardium) mostly fixed inferolateral defect  suggesting of LCx territory scar. No significant reversible  ischemia.  LV Wall Motion: LVEF 47%, with inferolateral akinesis  Chrystie Nose, MD, [facility] Board Certified in Nuclear Cardiology Attending Cardiologist [facility]   Assessment / Plan:  1. Dizziness - stable. No medicines to cut back. I think this is more related to posterior circulation.   2. CAD - stable - will manage medically. No symptoms.   3. Mild LV dysfunction - looks compensated. Weight is down.   4. Mild carotid disease   5. PAF - on amiodarone - needs surveillance labs today  6. Underlying PPM - followed by [name]. Sees him later this month  7. HLD - on Crestor - rechecking labs today  8. CKD - followed by [name]  9. Back pain - will touch base with PCP for options. I think he would be a poor surgical candidate but maybe he could have some type of injection.   See back in 6 months.  Patient is agreeable to this plan and will call if  any problems develop in the interim.   Rosalio Macadamia, RN, ANP-C [facility] [address] [place], [place]  [address] [phone]

## 2014-03-19 ENCOUNTER — Telehealth: Payer: Self-pay | Admitting: Nurse Practitioner

## 2014-03-19 NOTE — Telephone Encounter (Signed)
Follow Up ° ° ° ° °Pt calling following up on test results. Please call. °

## 2014-03-27 ENCOUNTER — Encounter: Payer: Self-pay | Admitting: Internal Medicine

## 2014-03-27 ENCOUNTER — Ambulatory Visit (INDEPENDENT_AMBULATORY_CARE_PROVIDER_SITE_OTHER): Payer: Medicare HMO | Admitting: Internal Medicine

## 2014-03-27 VITALS — BP 140/82 | HR 64 | Temp 97.9°F | Wt 184.8 lb

## 2014-03-27 DIAGNOSIS — M545 Low back pain, unspecified: Secondary | ICD-10-CM

## 2014-03-27 DIAGNOSIS — IMO0001 Reserved for inherently not codable concepts without codable children: Secondary | ICD-10-CM

## 2014-03-27 DIAGNOSIS — I10 Essential (primary) hypertension: Secondary | ICD-10-CM

## 2014-03-27 DIAGNOSIS — E1165 Type 2 diabetes mellitus with hyperglycemia: Secondary | ICD-10-CM

## 2014-03-27 MED ORDER — TIZANIDINE HCL 4 MG PO TABS: 4.0000 mg | ORAL_TABLET | Freq: Four times a day (QID) | ORAL | Status: DC | PRN

## 2014-03-27 MED ORDER — PREDNISONE 10 MG PO TABS: ORAL_TABLET | ORAL | Status: DC

## 2014-03-27 MED ORDER — HYDROCODONE-ACETAMINOPHEN 5-325 MG PO TABS: 1.0000 | ORAL_TABLET | Freq: Four times a day (QID) | ORAL | Status: DC | PRN

## 2014-03-27 NOTE — Patient Instructions (Signed)
Please take all new medication as prescribed  Please continue all other medications as before, and refills have been done if requested.  Please have the pharmacy call with any other refills you may need.  Please keep your appointments with your specialists as you may have planned  Please consider seeing Dr Smith/sport medicine if not improved in 1-2 wks

## 2014-03-27 NOTE — Progress Notes (Signed)
Pre visit review using our clinic review tool, if applicable. No additional management support is needed unless otherwise documented below in the visit note. 

## 2014-03-28 ENCOUNTER — Other Ambulatory Visit: Payer: Self-pay

## 2014-03-28 MED ORDER — ROSUVASTATIN CALCIUM 20 MG PO TABS: 20.0000 mg | ORAL_TABLET | Freq: Every day | ORAL | Status: DC

## 2014-03-30 DIAGNOSIS — M545 Low back pain, unspecified: Secondary | ICD-10-CM | POA: Insufficient documentation

## 2014-03-30 NOTE — Progress Notes (Signed)
Subjective:    Patient ID: Kent Nolan, male    DOB: [birth year], 80 y.o.   MRN: [id number]  HPI  80yo right handed with acute on chronic recurring LBP across the lower back bilat but worse in the midline, no bowel or bladder change, fever, wt loss,  worsening LE pain/numbness/weakness, gait change or falls except left leg hurts at night as well, very difficult to turn over at night. Pt denies chest pain, increased sob or doe, wheezing, orthopnea, PND, increased LE swelling, palpitations, dizziness or syncope.   Pt denies polydipsia, polyuria,   Past Medical History  Diagnosis Date  . Coronary artery disease     remote subendo MI in February 2005 with stents to RCA &LCX; s/p scute thrombosis of both stents in February 2010 with cardiogenic shock - s/p BMS to both lesions; follow up cath in September 2010 was satisfactory.  He is medically managed with aspirin/Plavix  . PAF (paroxysmal atrial fibrillation)     s/p cardioversion in August 2011  . GERD (gastroesophageal reflux disease)   . Hyperlipidemia   . Chronic kidney disease     followed by nephrology  . Pacemaker   . HTN (hypertension)   . Obesity   . High risk medication use     on amiodarone for PAF/VT since February 2010  . BRADYCARDIA 08/04/2010    s/p PPM since 2011 - generator change 12/03/13  . Type II or unspecified type diabetes mellitus without mention of complication, uncontrolled    Past Surgical History  Procedure Laterality Date  . Pacemaker insertion  May 2011    reports that he has never smoked. He does not have any smokeless tobacco history on file. He reports that he does not drink alcohol or use illicit drugs. family history includes Heart disease in his mother. Allergies  Allergen Reactions  . Penicillins Other (See Comments)    Reaction unknown  . Sulfonamide Derivatives Other (See Comments)    Reaction unknown   Current Outpatient Prescriptions on File Prior to Visit  Medication Sig Dispense  Refill  . acetaminophen (TYLENOL) 500 MG tablet Take 500 mg by mouth every 6 (six) hours as needed. For pain       . amiodarone (PACERONE) 200 MG tablet TAKE 1 TABLET BY MOUTH EVERY DAY  30 tablet  3  . aspirin 81 MG tablet Take 81 mg by mouth daily.        . clopidogrel (PLAVIX) 75 MG tablet TAKE 1 TABLET BY MOUTH EVERY DAY  30 tablet  1  . famotidine (PEPCID) 40 MG tablet TAKE 1 TABLET BY MOUTH EVERY DAY  30 tablet  1  . fenofibrate (TRICOR) 145 MG tablet TAKE 1 TABLET BY MOUTH EVERY DAY  30 tablet  0  . furosemide (LASIX) 40 MG tablet Take 0.5 tablets (20 mg total) by mouth daily.  15 tablet  3  . glucose blood test strip Use as instructed  100 each  12  . Lancets MISC Use as directed 1 per day  250.02  100 each  11  . Multiple Vitamin (MULTIVITAMIN) tablet Take 1 tablet by mouth daily.        . nitroGLYCERIN (NITROSTAT) 0.4 MG SL tablet Place 1 tablet (0.4 mg total) under the tongue every 5 (five) minutes as needed. For chest pain  25 tablet  5  . sitaGLIPtin (JANUVIA) 50 MG tablet Take 50 mg by mouth daily.       No current facility-administered medications on  file prior to visit.   Review of Systems  Constitutional: Negative for unusual diaphoresis or other sweats  HENT: Negative for ringing in ear Eyes: Negative for double vision or worsening visual disturbance.  Respiratory: Negative for choking and stridor.   Gastrointestinal: Negative for vomiting or other signifcant bowel change Genitourinary: Negative for hematuria or decreased urine volume.  Musculoskeletal: Negative for other MSK pain or swelling Skin: Negative for color change and worsening wound.  Neurological: Negative for tremors and numbness other than noted  Psychiatric/Behavioral: Negative for decreased concentration or agitation other than above   \    Objective:   Physical Exam BP 140/82  Pulse 64  Temp(Src) 97.9 F (36.6 C) (Oral)  Wt 184 lb 12 oz (83.802 kg)  SpO2 99% VS noted,  Constitutional: Pt  appears well-developed, well-nourished.  HENT: Head: NCAT.  Right Ear: External ear normal.  Left Ear: External ear normal.  Eyes: . Pupils are equal, round, and reactive to light. Conjunctivae and EOM are normal Neck: Normal range of motion. Neck supple.  Cardiovascular: Normal rate and regular rhythm.   Pulmonary/Chest: Effort normal and breath sounds normal.  Abd:  Soft, NT, ND, + BS, no falnk tender Neurological: Pt is alert. Not confused , motor grossly intact Skin: Skin is warm. No rash Psychiatric: Pt behavior is normal. No agitation.  Spine nontender, has + left lumbar paravertebral muscular tender as well    Assessment & Plan:

## 2014-03-30 NOTE — Assessment & Plan Note (Signed)
stable overall by history and exam, recent data reviewed with pt, and pt to continue medical treatment as before,  to f/u any worsening symptoms or concerns Lab Results  Component Value Date   HGBA1C 6.9* 11/26/2013

## 2014-03-30 NOTE — Assessment & Plan Note (Addendum)
C/w prob flare of lumbar djd/ddd, also left sided msk strain/spasm as well; for pain control, muscle relaxer prn, oral steroid trial, consider MRI for persistent pain < 2-4 wks

## 2014-03-30 NOTE — Assessment & Plan Note (Signed)
stable overall by history and exam, recent data reviewed with pt, and pt to continue medical treatment as before,  to f/u any worsening symptoms or concerns BP Readings from Last 3 Encounters:  03/27/14 140/82  03/18/14 140/90  12/17/13 100/80

## 2014-03-31 ENCOUNTER — Encounter: Payer: Self-pay | Admitting: Internal Medicine

## 2014-03-31 ENCOUNTER — Ambulatory Visit (INDEPENDENT_AMBULATORY_CARE_PROVIDER_SITE_OTHER): Payer: Medicare HMO | Admitting: Internal Medicine

## 2014-03-31 VITALS — BP 107/62 | HR 55 | Ht 67.0 in

## 2014-03-31 DIAGNOSIS — I480 Paroxysmal atrial fibrillation: Secondary | ICD-10-CM

## 2014-03-31 DIAGNOSIS — I2510 Atherosclerotic heart disease of native coronary artery without angina pectoris: Secondary | ICD-10-CM

## 2014-03-31 DIAGNOSIS — Z950 Presence of cardiac pacemaker: Secondary | ICD-10-CM

## 2014-03-31 DIAGNOSIS — I498 Other specified cardiac arrhythmias: Secondary | ICD-10-CM

## 2014-03-31 DIAGNOSIS — I10 Essential (primary) hypertension: Secondary | ICD-10-CM

## 2014-03-31 DIAGNOSIS — I4891 Unspecified atrial fibrillation: Secondary | ICD-10-CM

## 2014-03-31 LAB — MDC_IDC_ENUM_SESS_TYPE_INCLINIC
Battery Impedance: 100 Ohm
Brady Statistic AP VP Percent: 63 %
Brady Statistic AP VS Percent: 5 %
Brady Statistic AS VS Percent: 7 %
Date Time Interrogation Session: 20150727132112
Lead Channel Impedance Value: 410 Ohm
Lead Channel Impedance Value: 495 Ohm
Lead Channel Pacing Threshold Amplitude: 0.5 V
Lead Channel Pacing Threshold Pulse Width: 0.4 ms
Lead Channel Pacing Threshold Pulse Width: 0.4 ms
Lead Channel Sensing Intrinsic Amplitude: 4 mV
Lead Channel Setting Pacing Amplitude: 2 V
Lead Channel Setting Pacing Amplitude: 2.5 V
Lead Channel Setting Sensing Sensitivity: 2 mV
MDC IDC MSMT BATTERY REMAINING LONGEVITY: 74 mo
MDC IDC MSMT BATTERY VOLTAGE: 2.78 V
MDC IDC MSMT LEADCHNL RA SENSING INTR AMPL: 2 mV
MDC IDC MSMT LEADCHNL RV PACING THRESHOLD AMPLITUDE: 0.5 V
MDC IDC SET LEADCHNL RV PACING PULSEWIDTH: 0.4 ms
MDC IDC STAT BRADY AS VP PERCENT: 25 %

## 2014-03-31 NOTE — Progress Notes (Signed)
PCP:  [name], MD  The patient presents today for routine electrophysiology followup.  Since last being seen in our clinic, the patient  Had a generator change out 3/31 for ERI  battery status. He did well with this procedure but became presyncopal when standing for his weight this am. BP sitting 104/70 and standing 74/60. Wife reports he started Vicodin, muscle relaxer as well as prednisone on this past Friday for chronic back pain, ? DDD. He has had relief of back pain but is now very sleepy and unsteady on his feet. Does use a walker. Drinks enough fluids per wife and is on a daily diuretic.   Today, he denies symptoms of palpitations, chest pain, orthopnea, PND, lower extremity edema. Positive for presyncope.  The patient feels that he is tolerating medications without difficulties and is otherwise without complaint today.   Past Medical History  Diagnosis Date  . Coronary artery disease     remote subendo MI in February 2005 with stents to RCA &LCX; s/p scute thrombosis of both stents in February 2010 with cardiogenic shock - s/p BMS to both lesions; follow up cath in September 2010 was satisfactory.  He is medically managed with aspirin/Plavix  . PAF (paroxysmal atrial fibrillation)     s/p cardioversion in August 2011  . GERD (gastroesophageal reflux disease)   . Hyperlipidemia   . Chronic kidney disease     followed by nephrology  . Pacemaker   . HTN (hypertension)   . Obesity   . High risk medication use     on amiodarone for PAF/VT since February 2010  . BRADYCARDIA 08/04/2010    s/p PPM since 2011 - generator change 12/03/13  . Type II or unspecified type diabetes mellitus without mention of complication, uncontrolled    Past Surgical History  Procedure Laterality Date  . Pacemaker insertion  May 2011  . Pacemaker generator change  12/03/13    MDT Adapta L generator change for premature ERI by Dr Johney Frame    Current Outpatient Prescriptions  Medication Sig Dispense Refill   . acetaminophen (TYLENOL) 500 MG tablet Take 500 mg by mouth every 6 (six) hours as needed. For pain       . amiodarone (PACERONE) 200 MG tablet TAKE 1 TABLET BY MOUTH EVERY DAY  30 tablet  3  . aspirin 81 MG tablet Take 81 mg by mouth daily.        . clopidogrel (PLAVIX) 75 MG tablet TAKE 1 TABLET BY MOUTH EVERY DAY  30 tablet  1  . famotidine (PEPCID) 40 MG tablet TAKE 1 TABLET BY MOUTH EVERY DAY  30 tablet  1  . fenofibrate (TRICOR) 145 MG tablet TAKE 1 TABLET BY MOUTH EVERY DAY  30 tablet  0  . glucose blood test strip Use as instructed  100 each  12  . HYDROcodone-acetaminophen (NORCO/VICODIN) 5-325 MG per tablet Take 1 tablet by mouth every 6 (six) hours as needed for moderate pain.  60 tablet  0  . Lancets MISC Use as directed 1 per day  250.02  100 each  11  . Multiple Vitamin (MULTIVITAMIN) tablet Take 1 tablet by mouth daily.        . nitroGLYCERIN (NITROSTAT) 0.4 MG SL tablet Place 1 tablet (0.4 mg total) under the tongue every 5 (five) minutes as needed. For chest pain  25 tablet  5  . predniSONE (DELTASONE) 10 MG tablet 3 tabs by mouth per day for 3 days,2tabs per day for 3  days,1tab per day for 3 days  18 tablet  0  . rosuvastatin (CRESTOR) 20 MG tablet Take 1 tablet (20 mg total) by mouth daily.  90 tablet  1  . sitaGLIPtin (JANUVIA) 50 MG tablet Take 50 mg by mouth daily.      Marland Kitchen. tiZANidine (ZANAFLEX) 4 MG tablet Take 1 tablet (4 mg total) by mouth every 6 (six) hours as needed for muscle spasms.  40 tablet  0   No current facility-administered medications for this visit.    Allergies  Allergen Reactions  . Penicillins Other (See Comments)    Reaction unknown  . Sulfonamide Derivatives Other (See Comments)    Reaction unknown    History   Social History  . Marital Status: Married    Spouse Name: N/A    Number of Children: N/A  . Years of Education: N/A   Occupational History  . Not on file.   Social History Main Topics  . Smoking status: Never Smoker   .  Smokeless tobacco: Not on file  . Alcohol Use: No  . Drug Use: No  . Sexual Activity: No   Other Topics Concern  . Not on file   Social History Narrative  . No narrative on file    Physical Exam: Filed Vitals:   03/31/14 1151  BP: 107/62  Pulse: 55  Height: 5\' 7"  (1.702 m)    GEN- The patient is elderly appearing, alert and oriented x 3 today.   Head- normocephalic, atraumatic Eyes-  Sclera clear, conjunctiva pink Ears- hearing intact Oropharynx- clear Neck- supple, no JVP Lymph- no cervical lymphadenopathy Lungs- Clear to ausculation bilaterally, normal work of breathing Chest- R sided pacemaker pocket is healed. No signs of infection. Heart- Regular rate and rhythm, no murmurs, rubs or gallops, PMI not laterally displaced GI- soft, NT, ND, + BS Extremities- no clubbing, cyanosis, or edema  Pacemaker interrogation- reviewed in detail today,  See PACEART report  Assessment and Plan:   1. Sinus node dysfunction S/P pacemaker since 2011 with generator change out 3/31. Interrogation today is normal  2. afib He is on ASA and Plavix for afib.  He remains convinced that he should stay on plavix rather than consider a novel anticoagulation despite a long discussion by me.     3. CAD No ischemic symptoms  4. Presyncopal with Orthostatic hypotension. Stop lasix.  Decrease pain med and muscle relaxer to bid  and wife is to  contact PCP and inform of symptoms and  recommended change in medication.  F/U Norma FredricksonLori Gerhardt, NP, in one month (to make sure that he is not developing increased volume off of lasix) and EP in one year with as scheduled pacer checks.  carelink every 3 months

## 2014-03-31 NOTE — Patient Instructions (Signed)
Your physician recommends that you schedule a follow-up appointment in: 6 weeks with Norma FredricksonLori Gerhardt, NP and 12 months with [name]  Your physician has recommended you make the following change in your medication:  1) Stop Furosemide 2) Decrease Vicodin and muscle relaxer to twice daily and call PCP who prescribed and give details

## 2014-04-01 ENCOUNTER — Encounter: Payer: Self-pay | Admitting: Internal Medicine

## 2014-04-02 ENCOUNTER — Other Ambulatory Visit: Payer: Self-pay | Admitting: Internal Medicine

## 2014-04-07 ENCOUNTER — Other Ambulatory Visit: Payer: Self-pay | Admitting: Internal Medicine

## 2014-05-20 ENCOUNTER — Encounter: Payer: Self-pay | Admitting: Nurse Practitioner

## 2014-05-20 ENCOUNTER — Ambulatory Visit (INDEPENDENT_AMBULATORY_CARE_PROVIDER_SITE_OTHER): Payer: Medicare HMO | Admitting: Nurse Practitioner

## 2014-05-20 VITALS — BP 130/90 | HR 72 | Ht 67.0 in | Wt 186.1 lb

## 2014-05-20 DIAGNOSIS — Z79899 Other long term (current) drug therapy: Secondary | ICD-10-CM

## 2014-05-20 DIAGNOSIS — I4891 Unspecified atrial fibrillation: Secondary | ICD-10-CM

## 2014-05-20 DIAGNOSIS — I2510 Atherosclerotic heart disease of native coronary artery without angina pectoris: Secondary | ICD-10-CM

## 2014-05-20 DIAGNOSIS — I10 Essential (primary) hypertension: Secondary | ICD-10-CM

## 2014-05-20 DIAGNOSIS — I480 Paroxysmal atrial fibrillation: Secondary | ICD-10-CM

## 2014-05-20 DIAGNOSIS — R42 Dizziness and giddiness: Secondary | ICD-10-CM

## 2014-05-20 DIAGNOSIS — Z950 Presence of cardiac pacemaker: Secondary | ICD-10-CM

## 2014-05-20 NOTE — Progress Notes (Signed)
Kent Nolan Date of Birth: Oct 23, 1928 Medical Record #[id number]  History of Present Illness: Kent Nolan is seen back today for a 3 month visit. Seen for Kent Nolan. Former patient of Kent Nolan. He has known ischemic heart disease with prior subendocardial MI in February of 2005 with stents to the RCA and LCX. He experienced acute thrombosis of both stents in February of 2010 that was associated with cardiogenic shock and had repeat stenting to both lesions at that time with non drug eluting stents placed. Follow up cath in September of 2010 was satisfactory. He is on chronic Plavix.   Other problems include DM, HTN, GERD, ACE related cough, HLD, PAF and VT back in 2010 and on chronic amiodarone therapy, CRI and remote UTIs. He does have an underlying pacemaker in place since 2011 and has had prior cardioversion for his atrial fib back in August of 2011. While he does have an elevated CHADS score, he has been maintained on chronic Plavix and aspirin therapy in light of his stent thrombosis - he has declined anticoagulation with coumadin/NOVAC - has had no recurrent atrial fib. EF by echo from 2010 was 50 to 55%. Last generator change was in March of 2015.   I saw him back in February of 2015 - he was dizzy. Had "fallen out" several times since he was here - no significant injury yet. We did extensive testing at that time which included Myoview, echo, CT etc. Felt to have some degree of orthostasis. Has also had his generator replaced at the end of March 2015 per Kent Nolan.   Last seen by me back in July. Still dizzy. Pretty limited from back pain - had been to see his PCP and was given Vicodin, muscle relaxer and prednisone.  Then saw Kent Nolan back in July as well. Device check ok. He was quite orthostatic at his visit here - almost passed out while weighing. Was using narcotics and felt to be sedated as well. Lasix was stopped. Does not wish to change to coumadin/NOAC but prefers to stay on  his Plavix and aspirin therapy.  Comes in today. Here with his wife. Doing ok. He is falling. Fell into a rose bush this week. Scraped up his left leg. His back is still hurting. No longer on the narcotics that were given earlier this summer. Finished with Prednisone. Dizziness is better. No swelling. No chest pain. Breathing ok as long as he does not over exert and he will have to stop and rest in between activities.    Current Outpatient Prescriptions  Medication Sig Dispense Refill  . acetaminophen (TYLENOL) 500 MG tablet Take 500 mg by mouth every 6 (six) hours as needed. For pain       . amiodarone (PACERONE) 200 MG tablet TAKE 1 TABLET BY MOUTH EVERY DAY  30 tablet  5  . aspirin 81 MG tablet Take 81 mg by mouth daily.        . clopidogrel (PLAVIX) 75 MG tablet TAKE 1 TABLET BY MOUTH EVERY DAY  30 tablet  5  . famotidine (PEPCID) 40 MG tablet TAKE 1 TABLET BY MOUTH EVERY DAY  30 tablet  1  . fenofibrate (TRICOR) 145 MG tablet TAKE 1 TABLET BY MOUTH EVERY DAY  30 tablet  5  . glucose blood test strip Use as instructed  100 each  12  . Lancets MISC Use as directed 1 per day  250.02  100 each  11  . Multiple Vitamin (  MULTIVITAMIN) tablet Take 1 tablet by mouth daily.        . nitroGLYCERIN (NITROSTAT) 0.4 MG SL tablet Place 1 tablet (0.4 mg total) under the tongue every 5 (five) minutes as needed. For chest pain  25 tablet  5  . rosuvastatin (CRESTOR) 20 MG tablet Take 1 tablet (20 mg total) by mouth daily.  90 tablet  1  . sitaGLIPtin (JANUVIA) 50 MG tablet Take 50 mg by mouth daily.       No current facility-administered medications for this visit.    Allergies  Allergen Reactions  . Penicillins Other (See Comments)    Reaction unknown  . Sulfonamide Derivatives Other (See Comments)    Reaction unknown    Past Medical History  Diagnosis Date  . Coronary artery disease     remote subendo MI in February 2005 with stents to RCA &LCX; s/p scute thrombosis of both stents in February  2010 with cardiogenic shock - s/p BMS to both lesions; follow up cath in September 2010 was satisfactory.  He is medically managed with aspirin/Plavix  . PAF (paroxysmal atrial fibrillation)     s/p cardioversion in August 2011  . GERD (gastroesophageal reflux disease)   . Hyperlipidemia   . Chronic kidney disease     followed by nephrology  . Pacemaker   . HTN (hypertension)   . Obesity   . High risk medication use     on amiodarone for PAF/VT since February 2010  . BRADYCARDIA 08/04/2010    s/p PPM since 2011 - generator change 12/03/13  . Type II or unspecified type diabetes mellitus without mention of complication, uncontrolled     Past Surgical History  Procedure Laterality Date  . Pacemaker insertion  May 2011  . Pacemaker generator change  12/03/13    MDT Adapta L generator change for premature ERI by Dr Johney Nolan    History  Smoking status  . Never Smoker   Smokeless tobacco  . Not on file    History  Alcohol Use No    Family History  Problem Relation Age of Onset  . Heart disease Mother     Review of Systems: The review of systems is per the HPI.  All other systems were reviewed and are negative.  Physical Exam: BP 130/90  Pulse 72  Ht  (1.702 m)  Wt 186 lb 1.9 oz (84.423 kg)  BMI 29.14 kg/m2  SpO2 100% Patient is very pleasant and in no acute distress. He is elderly and getting frail. Skin is warm and dry. Color is normal.  Arms are pretty bruised up. HEENT is unremarkable. Normocephalic/atraumatic. PERRL. Sclera are nonicteric. Neck is supple. No masses. No JVD. Lungs are clear. Cardiac exam shows a regular rate and rhythm. Abdomen is soft. Extremities are without edema. Dressing on the right lower leg - does not look infected. Gait and ROM are intact. He is using a walker. No gross neurologic deficits noted.  Wt Readings from Last 3 Encounters:  05/20/14 186 lb 1.9 oz (84.423 kg)  03/27/14 184 lb 12 oz (83.802 kg)  03/18/14 189 lb 12.8 oz (86.093 kg)     LABORATORY DATA/PROCEDURES:  Lab Results  Component Value Date   WBC 7.3 03/18/2014   HGB 15.6 03/18/2014   HCT 46.4 03/18/2014   PLT 184.0 03/18/2014   GLUCOSE 139* 03/18/2014   CHOL 98 03/18/2014   TRIG 159.0* 03/18/2014   HDL 32.40* 03/18/2014   LDLDIRECT 45.4 07/23/2012   LDLCALC 34 03/18/2014  ALT 17 03/18/2014   AST 22 03/18/2014   NA 138 03/18/2014   K 3.8 03/18/2014   CL 101 03/18/2014   CREATININE 2.4* 03/18/2014   BUN 30* 03/18/2014   CO2 25 03/18/2014   TSH 1.95 03/18/2014   INR 1.1* 11/26/2013   HGBA1C 6.9* 11/26/2013   MICROALBUR 3.5* 11/26/2013    BNP (last 3 results) No results found for this basename: PROBNP,  in the last 8760 hours  Echo Study Conclusions from February 2015  - Left ventricle: The cavity size was normal. Systolic function was mildly reduced. The estimated ejection fraction was in the range of 45% to 50%. There was an increased relative contribution of atrial contraction to ventricular filling. Doppler parameters are consistent with abnormal left ventricular relaxation (grade 1 diastolic dysfunction). - Aortic valve: Moderate thickening and calcification, consistent with sclerosis. - Aorta: Aortic root dimension: 38mm (ED). - Ascending aorta: The ascending aorta was mildly dilated. - Mitral valve: Moderate regurgitation. Valve area by pressure half-time: 1.86cm^2.  Myoview Overall Impression:  Intermediate risk stress nuclear study with  large (35% of myocardium) mostly fixed inferolateral defect  suggesting of LCx territory scar. No significant reversible  ischemia.  LV Wall Motion: LVEF 47%, with inferolateral akinesis  Chrystie Nose, MD, [facility] Board Certified in Nuclear Cardiology Attending Cardiologist [facility]   Assessment / Plan:  1. Dizziness - orthostatic at last visit here - better clinically.   2. CAD - stable - will manage medically. No symptoms.   3. Mild LV dysfunction - looks compensated. Weight is stable.    4. Mild carotid disease   5. PAF - on amiodarone   6. Underlying PPM - followed by Kent Nolan.   7. HLD - on Crestor - samples given today  8. CKD - followed by [name]   9. Back pain - chronic - probably took too much of the narcotic - he is back using some tylenol.   See back in January as planned. Has follow up with renal and primary care later this fall.   Patient is agreeable to this plan and will call if any problems develop in the interim.   Rosalio Macadamia, RN, ANP-C [facility] [address] [place], [place]  [address] [phone]

## 2014-05-20 NOTE — Patient Instructions (Signed)
I will see you back as planned  Stay on your current medicines  Call the [facility] office at [phone] if you have any questions, problems or concerns.

## 2014-06-03 ENCOUNTER — Ambulatory Visit: Payer: Medicare HMO | Admitting: Internal Medicine

## 2014-07-02 ENCOUNTER — Encounter: Payer: Self-pay | Admitting: Internal Medicine

## 2014-07-02 ENCOUNTER — Ambulatory Visit (INDEPENDENT_AMBULATORY_CARE_PROVIDER_SITE_OTHER): Payer: Medicare HMO | Admitting: *Deleted

## 2014-07-02 DIAGNOSIS — I480 Paroxysmal atrial fibrillation: Secondary | ICD-10-CM

## 2014-07-02 LAB — MDC_IDC_ENUM_SESS_TYPE_REMOTE
Battery Impedance: 100 Ohm
Battery Remaining Longevity: 104 mo
Battery Voltage: 2.8 V
Brady Statistic AP VP Percent: 56 %
Brady Statistic AS VP Percent: 21 %
Brady Statistic AS VS Percent: 12 %
Lead Channel Impedance Value: 393 Ohm
Lead Channel Impedance Value: 480 Ohm
Lead Channel Pacing Threshold Pulse Width: 0.4 ms
Lead Channel Sensing Intrinsic Amplitude: 0.7 mV
Lead Channel Setting Pacing Amplitude: 2.5 V
Lead Channel Setting Pacing Amplitude: 4.5 V
Lead Channel Setting Sensing Sensitivity: 2 mV
MDC IDC MSMT LEADCHNL RA PACING THRESHOLD AMPLITUDE: 2.25 V
MDC IDC MSMT LEADCHNL RA PACING THRESHOLD PULSEWIDTH: 0.4 ms
MDC IDC MSMT LEADCHNL RV PACING THRESHOLD AMPLITUDE: 0.75 V
MDC IDC MSMT LEADCHNL RV SENSING INTR AMPL: 5.6 mV
MDC IDC SESS DTM: 20151028115039
MDC IDC SET LEADCHNL RV PACING PULSEWIDTH: 0.4 ms
MDC IDC STAT BRADY AP VS PERCENT: 10 %

## 2014-07-02 NOTE — Progress Notes (Signed)
Remote pacemaker transmission.   

## 2014-07-09 ENCOUNTER — Encounter: Payer: Self-pay | Admitting: Cardiology

## 2014-08-14 ENCOUNTER — Encounter (HOSPITAL_COMMUNITY): Payer: Self-pay | Admitting: Internal Medicine

## 2014-09-16 ENCOUNTER — Ambulatory Visit: Payer: Medicare HMO | Admitting: Nurse Practitioner

## 2014-09-30 ENCOUNTER — Encounter: Payer: Self-pay | Admitting: Nurse Practitioner

## 2014-09-30 ENCOUNTER — Ambulatory Visit (INDEPENDENT_AMBULATORY_CARE_PROVIDER_SITE_OTHER): Payer: Medicare HMO | Admitting: *Deleted

## 2014-09-30 ENCOUNTER — Encounter: Payer: Self-pay | Admitting: Internal Medicine

## 2014-09-30 ENCOUNTER — Telehealth: Payer: Self-pay

## 2014-09-30 ENCOUNTER — Ambulatory Visit (INDEPENDENT_AMBULATORY_CARE_PROVIDER_SITE_OTHER): Payer: Medicare HMO | Admitting: Nurse Practitioner

## 2014-09-30 VITALS — BP 146/96 | HR 88 | Ht 67.0 in | Wt 188.1 lb

## 2014-09-30 DIAGNOSIS — I480 Paroxysmal atrial fibrillation: Secondary | ICD-10-CM

## 2014-09-30 DIAGNOSIS — Z79899 Other long term (current) drug therapy: Secondary | ICD-10-CM

## 2014-09-30 DIAGNOSIS — E785 Hyperlipidemia, unspecified: Secondary | ICD-10-CM

## 2014-09-30 LAB — TSH: TSH: 3.08 u[IU]/mL (ref 0.35–4.50)

## 2014-09-30 LAB — BASIC METABOLIC PANEL
BUN: 32 mg/dL — ABNORMAL HIGH (ref 6–23)
CO2: 26 mEq/L (ref 19–32)
Calcium: 9.6 mg/dL (ref 8.4–10.5)
Chloride: 105 mEq/L (ref 96–112)
Creatinine, Ser: 1.88 mg/dL — ABNORMAL HIGH (ref 0.40–1.50)
GFR: 36.55 mL/min — ABNORMAL LOW (ref >60.00)
Glucose, Bld: 102 mg/dL — ABNORMAL HIGH (ref 70–99)
Potassium: 3.9 mEq/L (ref 3.5–5.1)
Sodium: 139 mEq/L (ref 135–145)

## 2014-09-30 LAB — CBC
HCT: 45.1 % (ref 39.0–52.0)
Hemoglobin: 15.2 g/dL (ref 13.0–17.0)
MCHC: 33.8 g/dL (ref 30.0–36.0)
MCV: 93.7 fl (ref 78.0–100.0)
Platelets: 182 10*3/uL (ref 150.0–400.0)
RBC: 4.81 Mil/uL (ref 4.22–5.81)
RDW: 13.7 % (ref 11.5–15.5)
WBC: 5.5 10*3/uL (ref 4.0–10.5)

## 2014-09-30 LAB — LIPID PANEL
Cholesterol: 102 mg/dL (ref 0–200)
HDL: 34.2 mg/dL — ABNORMAL LOW (ref >39.00)
LDL Cholesterol: 33 mg/dL (ref 0–99)
NonHDL: 67.8
Total CHOL/HDL Ratio: 3
Triglycerides: 172 mg/dL — ABNORMAL HIGH (ref 0.0–149.0)
VLDL: 34.4 mg/dL (ref 0.0–40.0)

## 2014-09-30 LAB — MDC_IDC_ENUM_SESS_TYPE_REMOTE
Battery Impedance: 113 Ohm
Battery Voltage: 2.78 V
Brady Statistic AP VP Percent: 61 %
Brady Statistic AP VS Percent: 9 %
Brady Statistic AS VS Percent: 9 %
Lead Channel Impedance Value: 405 Ohm
Lead Channel Impedance Value: 487 Ohm
Lead Channel Pacing Threshold Amplitude: 0.875 V
Lead Channel Setting Pacing Amplitude: 2.5 V
Lead Channel Setting Pacing Amplitude: 5 V
Lead Channel Setting Pacing Pulse Width: 0.4 ms
Lead Channel Setting Sensing Sensitivity: 2 mV
MDC IDC MSMT BATTERY REMAINING LONGEVITY: 69 mo
MDC IDC MSMT LEADCHNL RA SENSING INTR AMPL: 2.8 mV
MDC IDC MSMT LEADCHNL RV PACING THRESHOLD PULSEWIDTH: 0.4 ms
MDC IDC SESS DTM: 20160126183504
MDC IDC STAT BRADY AS VP PERCENT: 21 %

## 2014-09-30 LAB — HEPATIC FUNCTION PANEL
ALT: 14 U/L (ref 0–53)
AST: 20 U/L (ref 0–37)
Albumin: 3.8 g/dL (ref 3.5–5.2)
Alkaline Phosphatase: 67 U/L (ref 39–117)
Bilirubin, Direct: 0.2 mg/dL (ref 0.0–0.3)
Total Bilirubin: 0.6 mg/dL (ref 0.2–1.2)
Total Protein: 6.9 g/dL (ref 6.0–8.3)

## 2014-09-30 NOTE — Telephone Encounter (Signed)
Pt made aware that he does not need to come back in March 2016 to see Dr. Johney FrameAllred that he will receive a letter in the mail to setup annual appt in July.  Pt is going to submit a [employer] remote transmission today (1/26) and another on 4/26. Spoke with pt wife to review plan .  Pt made aware, agrees with plan, and questions at this time.

## 2014-09-30 NOTE — Progress Notes (Signed)
CARDIOLOGY OFFICE NOTE  Date:  09/30/2014    Kent Nolan Date of Birth: 1929/05/01 Medical Record #[id number]  PCP:  Oliver BarreJames John, MD  Cardiologist:  Allred    Chief Complaint  Patient presents with  . Coronary Artery Disease    4 month check. Seen for Dr. Johney FrameAllred.  . Cardiomyopathy     History of Present Illness: Kent Nolan is a 81 y.o. male who presents today for a 4 month check. Seen for Dr. Johney FrameAllred. Former patient of Dr. Ronnald Nianennant's. He has known ischemic heart disease with prior subendocardial MI in February of 2005 with stents to the RCA and LCX. He experienced acute thrombosis of both stents in February of 2010 that was associated with cardiogenic shock and had repeat stenting to both lesions at that time with non drug eluting stents placed. Follow up cath in September of 2010 was satisfactory. He is on chronic Plavix.   Other problems include DM, HTN, GERD, ACE related cough, HLD, PAF and VT back in 2010 and on chronic amiodarone therapy, CRI and remote UTIs. He does have an underlying pacemaker in place since 2011 and has had prior cardioversion for his atrial fib back in August of 2011. While he does have an elevated CHADS score, he has been maintained on chronic Plavix and aspirin therapy in light of his stent thrombosis - he has declined anticoagulation with coumadin/NOVAC - has had no recurrent atrial fib. EF by echo from 2010 was 50 to 55%. Last generator change was in March of 2015.   I saw him back in February of 2015 - he was dizzy. Had "fallen out" several times since he was here - no significant injury yet. We did extensive testing at that time which included Myoview, echo, CT etc. Felt to have some degree of orthostasis. Has also had his generator replaced at the end of March 2015 per Dr. Johney FrameAllred.   Seen by me in July. Still dizzy. Pretty limited from back pain - had been to see his PCP and was given Vicodin, muscle relaxer and prednisone. Then saw Dr. Johney FrameAllred  back in July as well. Device check ok. He was quite orthostatic at his visit here - almost passed out while weighing. Was using narcotics and felt to be sedated as well. Lasix was stopped. Does not wish to change to coumadin/NOAC but prefers to stay on his Plavix and aspirin therapy.  Last seen here in September - he had had a fall. Cardiac status seemed stable - primary limiting factor was his back.   Comes in today. Here with his wife. Doing ok. His back remains his most limiting factor. Says his heart is doing ok. No chest pain. Not short of breath. Says his dizziness is really not an issue. BP ok at home. Using a little more salt. Weight up 2 pounds. Overall he is ok with how he is doing. No more falls. Got a lift chair for Christmas. Seeing Renal next week.  Past Medical History  Diagnosis Date  . Coronary artery disease     remote subendo MI in February 2005 with stents to RCA &LCX; s/p scute thrombosis of both stents in February 2010 with cardiogenic shock - s/p BMS to both lesions; follow up cath in September 2010 was satisfactory.  He is medically managed with aspirin/Plavix  . PAF (paroxysmal atrial fibrillation)     s/p cardioversion in August 2011  . GERD (gastroesophageal reflux disease)   . Hyperlipidemia   .  Chronic kidney disease     followed by nephrology  . Pacemaker   . HTN (hypertension)   . Obesity   . High risk medication use     on amiodarone for PAF/VT since February 2010  . BRADYCARDIA 08/04/2010    s/p PPM since 2011 - generator change 12/03/13  . Type II or unspecified type diabetes mellitus without mention of complication, uncontrolled     Past Surgical History  Procedure Laterality Date  . Pacemaker insertion  May 2011  . Pacemaker generator change  12/03/13    MDT Adapta L generator change for premature ERI by Dr Johney Frame  . Permanent pacemaker generator change N/A 12/03/2013    Procedure: PERMANENT PACEMAKER GENERATOR CHANGE;  Surgeon: [name], MD;   Location: [facility] CATH [facility];  Service: Cardiovascular;  Laterality: N/A;     Medications: Current Outpatient Prescriptions  Medication Sig Dispense Refill  . acetaminophen (TYLENOL) 500 MG tablet Take 500 mg by mouth every 6 (six) hours as needed. For pain     . amiodarone (PACERONE) 200 MG tablet TAKE 1 TABLET BY MOUTH EVERY DAY 30 tablet 5  . aspirin 81 MG tablet Take 81 mg by mouth daily.      . clopidogrel (PLAVIX) 75 MG tablet TAKE 1 TABLET BY MOUTH EVERY DAY 30 tablet 5  . famotidine (PEPCID) 40 MG tablet TAKE 1 TABLET BY MOUTH EVERY DAY 30 tablet 1  . fenofibrate (TRICOR) 145 MG tablet TAKE 1 TABLET BY MOUTH EVERY DAY 30 tablet 5  . glucose blood test strip Use as instructed 100 each 12  . Lancets MISC Use as directed 1 per day  250.02 100 each 11  . Multiple Vitamin (MULTIVITAMIN) tablet Take 1 tablet by mouth daily.      . nitroGLYCERIN (NITROSTAT) 0.4 MG SL tablet Place 1 tablet (0.4 mg total) under the tongue every 5 (five) minutes as needed. For chest pain 25 tablet 5  . rosuvastatin (CRESTOR) 20 MG tablet Take 1 tablet (20 mg total) by mouth daily. 90 tablet 1  . sitaGLIPtin (JANUVIA) 50 MG tablet Take 50 mg by mouth daily.     No current facility-administered medications for this visit.    Allergies: Allergies  Allergen Reactions  . Penicillins Other (See Comments)    Reaction unknown  . Sulfonamide Derivatives Other (See Comments)    Reaction unknown    Social History: The patient  reports that he has never smoked. He does not have any smokeless tobacco history on file. He reports that he does not drink alcohol or use illicit drugs.   Family History: The patient's family history includes Heart disease in his mother.   Review of Systems: Please see the history of present illness.   Otherwise, the review of systems is positive for back pain, DOE, hearing loss, easy bruising and balance issues.   All other systems are reviewed and negative.   Physical Exam: VS:  BP  146/96 mmHg  Pulse 88  Ht 5\' 7"  (1.702 m)  Wt 188 lb 1.9 oz (85.331 kg)  BMI 29.46 kg/m2 .  BMI Body mass index is 29.46 kg/(m^2).  BP is 130/80 by me.  General: Pleasant. Well developed, well nourished and in no acute distress.  HEENT: Normal. Neck: Supple, no JVD, carotid bruits, or masses noted.  Cardiac: Regular rate and rhythm. No murmurs, rubs, or gallops. No edema.  Respiratory:  Lungs are clear to auscultation bilaterally with normal work of breathing.  GI: Soft and  nontender.  MS: No deformity or atrophy. Gait and ROM intact.Using a cane.  Skin: Warm and dry. Color is normal. Skin is ecchymotic.  Neuro:  Strength and sensation are intact and no gross focal deficits noted.  Psych: Alert, appropriate and with normal affect.   Wt Readings from Last 3 Encounters:  09/30/14 188 lb 1.9 oz (85.331 kg)  05/20/14 186 lb 1.9 oz (84.423 kg)  03/27/14 184 lb 12 oz (83.802 kg)    LABORATORY DATA:  EKG:  EKG is not ordered today.   Lab Results  Component Value Date   WBC 7.3 03/18/2014   HGB 15.6 03/18/2014   HCT 46.4 03/18/2014   PLT 184.0 03/18/2014   GLUCOSE 139* 03/18/2014   CHOL 98 03/18/2014   TRIG 159.0* 03/18/2014   HDL 32.40* 03/18/2014   LDLDIRECT 45.4 07/23/2012   LDLCALC 34 03/18/2014   ALT 17 03/18/2014   AST 22 03/18/2014   NA 138 03/18/2014   K 3.8 03/18/2014   CL 101 03/18/2014   CREATININE 2.4* 03/18/2014   BUN 30* 03/18/2014   CO2 25 03/18/2014   TSH 1.95 03/18/2014   INR 1.1* 11/26/2013   HGBA1C 6.9* 11/26/2013   MICROALBUR 3.5* 11/26/2013    BNP (last 3 results) No results for input(s): PROBNP in the last 8760 hours.  Other Studies Reviewed Today:  Echo Study Conclusions from February 2015  - Left ventricle: The cavity size was normal. Systolic function was mildly reduced. The estimated ejection fraction was in the range of 45% to 50%. There was an increased relative contribution of atrial contraction to ventricular filling. Doppler  parameters are consistent with abnormal left ventricular relaxation (grade 1 diastolic dysfunction). - Aortic valve: Moderate thickening and calcification, consistent with sclerosis. - Aorta: Aortic root dimension: 38mm (ED). - Ascending aorta: The ascending aorta was mildly dilated. - Mitral valve: Moderate regurgitation. Valve area by pressure half-time: 1.86cm^2.  Myoview Overall Impression:  Intermediate risk stress nuclear study with  large (35% of myocardium) mostly fixed inferolateral defect  suggesting of LCx territory scar. No significant reversible  ischemia.  LV Wall Motion: LVEF 47%, with inferolateral akinesis  Chrystie Nose, MD, [facility] Board Certified in Nuclear Cardiology Attending Cardiologist [facility]   Assessment / Plan:  1. Dizziness - not really an issue anymore.   2. CAD - stable - will manage medically. No symptoms.   3. Mild LV dysfunction - looks compensated. Encouraged to watch his use of salt.  4. Mild carotid disease   5. PAF - on amiodarone - recheck surveillance labs today.   6. Underlying PPM - followed by [name].   7. HLD - on Crestor - samples given today  8. CKD - followed by [name]   9. Back pain - chronic.   Current medicines are reviewed with the patient today.  The patient does not have concerns regarding medicines.  The following changes have been made:  no change  Labs/ tests ordered today include: see below.   Orders Placed This Encounter  Procedures  . Basic metabolic panel  . CBC  . TSH  . Hepatic function panel  . Lipid panel     Disposition:   FU with [name] in 6 months and I will see back in one year. Will be available as needed.   Patient is agreeable to this plan and will call if any problems develop in the interim.   Signed: Rosalio Macadamia, RN, ANP-C 09/30/2014 10:11 AM  [facility] [address] [place], [place]  [address] Phone: [phone] Fax: [phone]

## 2014-09-30 NOTE — Patient Instructions (Addendum)
We will be checking the following labs today BMET, CBC, HPF, lipids  Stay on your current medicines  I will see you in a year  Samples of Crestor today  Call the [facility] office at [phone] if you have any questions, problems or concerns.

## 2014-10-01 ENCOUNTER — Telehealth: Payer: Self-pay | Admitting: Nurse Practitioner

## 2014-10-01 NOTE — Progress Notes (Signed)
Remote pacemaker transmission.   

## 2014-10-01 NOTE — Telephone Encounter (Signed)
New Message  Lab Follow Up

## 2014-10-01 NOTE — Telephone Encounter (Signed)
Called patient and his wife back. Per Norma FredricksonLori Gerhardt NP, Patient's labs look pretty good. Kidney function actually looks better.  Send copy to Dr. Allena KatzPatel with [facility]. No change with current regimen of medicines. Patient and his wife verbalized understanding and will fax to Dr. Allena KatzPatel.

## 2014-10-08 ENCOUNTER — Encounter: Payer: Self-pay | Admitting: Cardiology

## 2014-10-09 ENCOUNTER — Other Ambulatory Visit: Payer: Self-pay | Admitting: Internal Medicine

## 2014-10-28 ENCOUNTER — Other Ambulatory Visit: Payer: Self-pay | Admitting: Internal Medicine

## 2014-10-28 ENCOUNTER — Other Ambulatory Visit: Payer: Self-pay | Admitting: *Deleted

## 2014-10-28 NOTE — Telephone Encounter (Signed)
Error

## 2014-11-24 ENCOUNTER — Encounter: Payer: Medicare HMO | Admitting: Internal Medicine

## 2014-12-08 ENCOUNTER — Other Ambulatory Visit: Payer: Self-pay | Admitting: Internal Medicine

## 2014-12-30 ENCOUNTER — Encounter: Payer: Self-pay | Admitting: Internal Medicine

## 2014-12-30 ENCOUNTER — Ambulatory Visit (INDEPENDENT_AMBULATORY_CARE_PROVIDER_SITE_OTHER): Payer: Medicare HMO | Admitting: *Deleted

## 2014-12-30 DIAGNOSIS — I480 Paroxysmal atrial fibrillation: Secondary | ICD-10-CM

## 2014-12-30 NOTE — Progress Notes (Signed)
Remote pacemaker transmission.   

## 2015-01-01 ENCOUNTER — Ambulatory Visit (INDEPENDENT_AMBULATORY_CARE_PROVIDER_SITE_OTHER): Payer: Medicare HMO | Admitting: *Deleted

## 2015-01-01 ENCOUNTER — Telehealth: Payer: Self-pay | Admitting: *Deleted

## 2015-01-01 ENCOUNTER — Encounter: Payer: Self-pay | Admitting: Internal Medicine

## 2015-01-01 DIAGNOSIS — I480 Paroxysmal atrial fibrillation: Secondary | ICD-10-CM

## 2015-01-01 DIAGNOSIS — Z950 Presence of cardiac pacemaker: Secondary | ICD-10-CM | POA: Diagnosis not present

## 2015-01-01 LAB — MDC_IDC_ENUM_SESS_TYPE_INCLINIC
Battery Impedance: 137 Ohm
Battery Remaining Longevity: 64 mo
Battery Voltage: 2.77 V
Brady Statistic AP VS Percent: 8 %
Brady Statistic AS VS Percent: 7 %
Lead Channel Impedance Value: 383 Ohm
Lead Channel Impedance Value: 478 Ohm
Lead Channel Pacing Threshold Pulse Width: 1 ms
Lead Channel Sensing Intrinsic Amplitude: 4 mV
Lead Channel Setting Pacing Amplitude: 2.5 V
Lead Channel Setting Pacing Amplitude: 6 V
Lead Channel Setting Pacing Pulse Width: 0.4 ms
MDC IDC MSMT LEADCHNL RA PACING THRESHOLD AMPLITUDE: 4.5 V
MDC IDC MSMT LEADCHNL RA SENSING INTR AMPL: 0.5 mV
MDC IDC SESS DTM: 20160428170305
MDC IDC SET LEADCHNL RV SENSING SENSITIVITY: 2 mV
MDC IDC STAT BRADY AP VP PERCENT: 64 %
MDC IDC STAT BRADY AS VP PERCENT: 21 %

## 2015-01-01 LAB — MDC_IDC_ENUM_SESS_TYPE_REMOTE
Battery Remaining Longevity: 65 mo
Battery Voltage: 2.77 V
Brady Statistic AP VP Percent: 64 %
Brady Statistic AS VP Percent: 21 %
Brady Statistic AS VS Percent: 7 %
Date Time Interrogation Session: 20160426115939
Lead Channel Pacing Threshold Pulse Width: 0.4 ms
Lead Channel Setting Pacing Amplitude: 2.5 V
Lead Channel Setting Pacing Pulse Width: 0.4 ms
MDC IDC MSMT BATTERY IMPEDANCE: 137 Ohm
MDC IDC MSMT LEADCHNL RA IMPEDANCE VALUE: 492 Ohm
MDC IDC MSMT LEADCHNL RA SENSING INTR AMPL: 0.7 mV — AB
MDC IDC MSMT LEADCHNL RV IMPEDANCE VALUE: 405 Ohm
MDC IDC MSMT LEADCHNL RV PACING THRESHOLD AMPLITUDE: 0.875 V
MDC IDC SET LEADCHNL RA PACING AMPLITUDE: 5 V
MDC IDC SET LEADCHNL RV SENSING SENSITIVITY: 2 mV
MDC IDC STAT BRADY AP VS PERCENT: 8 %

## 2015-01-01 NOTE — Progress Notes (Signed)
Add on pacemaker check for high atrial output and AHR episodes- retrograde conduction.  Normal device function. Atrial threshold 4.5V @1ms , programmed 5.5V @1ms - capture management turned off. Device programmed to optimize intrinsic conduction. Estimated longevity 5.3 years. Patient enrolled in remote follow-up. follow up scheduled 04/01/15 with JA.

## 2015-01-01 NOTE — Telephone Encounter (Signed)
Follow up ° ° ° ° ° °Returning Kent Nolan's call °

## 2015-01-01 NOTE — Telephone Encounter (Signed)
LMOM to call device clinic regarding remote transmission from 10/31/14.

## 2015-01-06 ENCOUNTER — Encounter: Payer: Self-pay | Admitting: Cardiology

## 2015-01-12 ENCOUNTER — Encounter: Payer: Self-pay | Admitting: Internal Medicine

## 2015-01-12 ENCOUNTER — Ambulatory Visit (INDEPENDENT_AMBULATORY_CARE_PROVIDER_SITE_OTHER): Payer: Medicare HMO | Admitting: Internal Medicine

## 2015-01-12 VITALS — BP 118/84 | HR 62 | Ht 67.0 in | Wt 189.0 lb

## 2015-01-12 DIAGNOSIS — T82190A Other mechanical complication of cardiac electrode, initial encounter: Secondary | ICD-10-CM

## 2015-01-12 DIAGNOSIS — T82110A Breakdown (mechanical) of cardiac electrode, initial encounter: Secondary | ICD-10-CM | POA: Insufficient documentation

## 2015-01-12 DIAGNOSIS — I480 Paroxysmal atrial fibrillation: Secondary | ICD-10-CM

## 2015-01-12 DIAGNOSIS — I495 Sick sinus syndrome: Secondary | ICD-10-CM | POA: Diagnosis not present

## 2015-01-12 LAB — CUP PACEART INCLINIC DEVICE CHECK
Battery Remaining Longevity: 135 mo
Battery Voltage: 2.74 V
Brady Statistic RV Percent Paced: 76 %
Lead Channel Impedance Value: 388 Ohm
Lead Channel Impedance Value: 67 Ohm
Lead Channel Pacing Threshold Amplitude: 0.75 V
Lead Channel Pacing Threshold Pulse Width: 0.4 ms
Lead Channel Pacing Threshold Pulse Width: 1 ms
Lead Channel Sensing Intrinsic Amplitude: 0.5 mV
Lead Channel Setting Pacing Amplitude: 2.5 V
Lead Channel Setting Pacing Pulse Width: 0.4 ms
Lead Channel Setting Sensing Sensitivity: 2 mV
MDC IDC MSMT BATTERY IMPEDANCE: 161 Ohm
MDC IDC MSMT LEADCHNL RA PACING THRESHOLD AMPLITUDE: 3.25 V
MDC IDC MSMT LEADCHNL RV SENSING INTR AMPL: 5.6 mV
MDC IDC SESS DTM: 20160509132422

## 2015-01-12 NOTE — Progress Notes (Signed)
Electrophysiology Office Note   Date:  01/12/2015   ID:  Kent Nolan, DOB [birth year], MRN [id number]  PCP:  Oliver BarreJames John, MD   Primary Electrophysiologist: Kent RangeJames Javia Dillow, MD    Chief Complaint  Patient presents with  . PAF     History of Present Illness: Kent GammaWilliam P Nolan is a 81 y.o. male who presents today for electrophysiology evaluation.   Doing well.  He is elderly.  Walks slowly with a walker. He is here today because recent remote revealed that his atrial lead threshold has risen drastically.  Today, he denies symptoms of palpitations, chest pain, shortness of breath, orthopnea, PND, lower extremity edema, claudication, dizziness, presyncope, syncope, bleeding, or neurologic sequela. The patient is tolerating medications without difficulties and is otherwise without complaint today.    Past Medical History  Diagnosis Date  . Coronary artery disease     remote subendo MI in February 2005 with stents to RCA &LCX; s/p scute thrombosis of both stents in February 2010 with cardiogenic shock - s/p BMS to both lesions; follow up cath in September 2010 was satisfactory.  He is medically managed with aspirin/Plavix  . PAF (paroxysmal atrial fibrillation)     s/p cardioversion in August 2011  . GERD (gastroesophageal reflux disease)   . Hyperlipidemia   . Chronic kidney disease     followed by nephrology  . Pacemaker   . HTN (hypertension)   . Obesity   . High risk medication use     on amiodarone for PAF/VT since February 2010  . BRADYCARDIA 08/04/2010    s/p PPM since 2011 - generator change 12/03/13  . Type II or unspecified type diabetes mellitus without mention of complication, uncontrolled    Past Surgical History  Procedure Laterality Date  . Pacemaker insertion  May 2011  . Pacemaker generator change  12/03/13    MDT Adapta L generator change for premature ERI by [name]  . Permanent pacemaker generator change N/A 12/03/2013    Procedure: PERMANENT PACEMAKER  GENERATOR CHANGE;  Surgeon: Gardiner RhymeJames D Meagan Spease, MD;  Location: [facility];  Service: Cardiovascular;  Laterality: N/A;     Current Outpatient Prescriptions  Medication Sig Dispense Refill  . acetaminophen (TYLENOL) 500 MG tablet Take 500 mg by mouth every 6 (six) hours as needed. For pain     . amiodarone (PACERONE) 200 MG tablet TAKE 1 TABLET BY MOUTH DAILY 30 tablet 10  . aspirin 81 MG tablet Take 81 mg by mouth daily.      . clopidogrel (PLAVIX) 75 MG tablet TAKE 1 TABLET BY MOUTH DAILY 30 tablet 10  . famotidine (PEPCID) 40 MG tablet TAKE 1 TABLET BY MOUTH DAILY 30 tablet 11  . fenofibrate (TRICOR) 145 MG tablet TAKE 1 TABLET BY MOUTH DAILY 30 tablet 10  . glucose blood test strip Use as instructed 100 each 12  . Lancets MISC Use as directed 1 per day  250.02 100 each 11  . Multiple Vitamin (MULTIVITAMIN) tablet Take 1 tablet by mouth daily.      . nitroGLYCERIN (NITROSTAT) 0.4 MG SL tablet Place 1 tablet (0.4 mg total) under the tongue every 5 (five) minutes as needed. For chest pain 25 tablet 5  . rosuvastatin (CRESTOR) 20 MG tablet Take 20 mg by mouth daily. GENERIC ONLY     No current facility-administered medications for this visit.    Allergies:   Penicillins and Sulfonamide derivatives   Social History:  The patient  reports that he  has never smoked. He does not have any smokeless tobacco history on file. He reports that he does not drink alcohol or use illicit drugs.   Family History:  The patient's family history includes Heart disease in his mother.    ROS:  Please see the history of present illness.   All other systems are reviewed and negative.    PHYSICAL EXAM: VS:  BP 118/84 mmHg  Pulse 62  Ht 5\' 7"  (1.702 m)  Wt 189 lb (85.73 kg)  BMI 29.59 kg/m2 , BMI Body mass index is 29.59 kg/(m^2). GEN: elderly and frail, in no acute distress HEENT: normal Neck: no JVD, carotid bruits, or masses Cardiac: RRR;  Respiratory:  clear to auscultation bilaterally, normal work  of breathing GI: soft, nontender, nondistended, + BS MS: walks slowly with a walker Skin: warm and dry, R sided device pocket is well healed Neuro:  Strength and sensation are intact Psych: euthymic mood, full affect  EKG:  EKG is ordered today. The ekg ordered today shows AV paced rhythm  Device interrogation is reviewed today in detail.  See PaceArt for details.   Recent Labs: 09/30/2014: ALT 14; BUN 32*; Creatinine 1.88*; Hemoglobin 15.2; Platelets 182.0; Potassium 3.9; Sodium 139; TSH 3.08    Lipid Panel     Component Value Date/Time   CHOL 102 09/30/2014 1032   TRIG 172.0* 09/30/2014 1032   HDL 34.20* 09/30/2014 1032   CHOLHDL 3 09/30/2014 1032   VLDL 34.4 09/30/2014 1032   LDLCALC 33 09/30/2014 1032   LDLDIRECT 45.4 07/23/2012 0939     Wt Readings from Last 3 Encounters:  01/12/15 189 lb (85.73 kg)  09/30/14 188 lb 1.9 oz (85.331 kg)  05/20/14 186 lb 1.9 oz (84.423 kg)      ASSESSMENT AND PLAN:    1. Sinus node dysfunction S/P pacemaker since 2011 with generator change out 3/31. 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  Atrial threshold is very high.  With high output pacing, battery longevity is only 2 years.  I have tried unipolar pacing which did not reveal a better threshold.  Today, I discussed options of VVIR pacing or lead revision.  Given his advanced age and cormobidities, he is aware that risks of lead revision are increased and would prefer to try VVIR pacing. I have therefore reprogrammed VVIR and performed a 6 min walk in the office.  He currently cannot tell a difference.  He would like to try this for a few weeks to see how he does.  I will therefore have him return in 3 weeks for further assessment.  2. afib He is on ASA and Plavix for afib.  He remains convinced that he should stay on plavix rather than consider a novel anticoagulation despite a long discussion by me.     3. CAD No ischemic symptoms  4. Presyncopal with Orthostatic  hypotension. improved  carelink every 3 months  Current medicines are reviewed at length with the patient today.   The patient does not have concerns regarding his medicines.  The following changes were made today:  none  Labs/ tests ordered today include:  Orders Placed This Encounter  Procedures  . Implantable device check    Signed, Kent RangeJames Kairyn Olmeda, MD  01/12/2015 5:48 PM     [facility] [address] [place] [address] [phone] (office) [phone] (fax)

## 2015-01-12 NOTE — Patient Instructions (Signed)
Medication Instructions:  Your physician recommends that you continue on your current medications as directed. Please refer to the Current Medication list given to you today.   Labwork: None ordered  Testing/Procedures: None ordered  Follow-Up: Your physician recommends that you schedule a follow-up appointment in: 3 weeks with Dr Allred   Any Other Special Instructions Will Be Listed Below (If Applicable).   

## 2015-01-20 ENCOUNTER — Other Ambulatory Visit: Payer: Self-pay | Admitting: Internal Medicine

## 2015-01-28 ENCOUNTER — Ambulatory Visit: Payer: Medicare HMO | Admitting: Internal Medicine

## 2015-02-03 ENCOUNTER — Encounter: Payer: Self-pay | Admitting: *Deleted

## 2015-02-05 ENCOUNTER — Ambulatory Visit: Payer: Medicare HMO | Admitting: Internal Medicine

## 2015-02-09 ENCOUNTER — Ambulatory Visit (INDEPENDENT_AMBULATORY_CARE_PROVIDER_SITE_OTHER): Payer: Medicare HMO | Admitting: Internal Medicine

## 2015-02-09 ENCOUNTER — Encounter: Payer: Self-pay | Admitting: *Deleted

## 2015-02-09 ENCOUNTER — Encounter: Payer: Self-pay | Admitting: Internal Medicine

## 2015-02-09 VITALS — BP 126/80 | HR 71 | Ht 67.0 in | Wt 195.0 lb

## 2015-02-09 DIAGNOSIS — T82190A Other mechanical complication of cardiac electrode, initial encounter: Secondary | ICD-10-CM

## 2015-02-09 DIAGNOSIS — I480 Paroxysmal atrial fibrillation: Secondary | ICD-10-CM | POA: Diagnosis not present

## 2015-02-09 DIAGNOSIS — I495 Sick sinus syndrome: Secondary | ICD-10-CM

## 2015-02-09 DIAGNOSIS — T82110A Breakdown (mechanical) of cardiac electrode, initial encounter: Secondary | ICD-10-CM | POA: Diagnosis not present

## 2015-02-09 LAB — CUP PACEART INCLINIC DEVICE CHECK
Brady Statistic RV Percent Paced: 90.4 %
Date Time Interrogation Session: 20160606123856
Lead Channel Impedance Value: 377 Ohm
Lead Channel Pacing Threshold Amplitude: 1 V
Lead Channel Pacing Threshold Amplitude: 6 V
Lead Channel Sensing Intrinsic Amplitude: 0.7 mV
Lead Channel Sensing Intrinsic Amplitude: 4 mV
Lead Channel Setting Pacing Amplitude: 2.5 V
Lead Channel Setting Pacing Amplitude: 6 V
Lead Channel Setting Pacing Pulse Width: 0.4 ms
Lead Channel Setting Sensing Sensitivity: 2 mV
MDC IDC MSMT BATTERY IMPEDANCE: 162 Ohm
MDC IDC MSMT BATTERY REMAINING LONGEVITY: 29 mo
MDC IDC MSMT BATTERY VOLTAGE: 2.79 V
MDC IDC MSMT LEADCHNL RA IMPEDANCE VALUE: 341 Ohm
MDC IDC MSMT LEADCHNL RA PACING THRESHOLD PULSEWIDTH: 1 ms
MDC IDC MSMT LEADCHNL RV PACING THRESHOLD PULSEWIDTH: 0.4 ms

## 2015-02-09 NOTE — Progress Notes (Signed)
Electrophysiology Office Note   Date:  02/09/2015   ID:  Paralee CancelWilliam P Nolan, DOB [birth year], MRN [id number]  PCP:  Oliver BarreJames John, MD   Primary Electrophysiologist: Kent RangeJames Zaid Tomes, MD    Chief Complaint  Patient presents with  . SSS  . PAF     History of Present Illness: Kent GammaWilliam P Nolan is a 81 y.o. male who presents today for electrophysiology evaluation.  Since programming his device VVIR (due to RA lead failure), he has developed progressive SOB.  He reports poor tolerance of av dissociation and decreased ability to walk due to fatigue.  Today, he denies symptoms of palpitations, chest pain,  orthopnea, PND, lower extremity edema, claudication, dizziness, presyncope, syncope, bleeding, or neurologic sequela. The patient is tolerating medications without difficulties and is otherwise without complaint today.    Past Medical History  Diagnosis Date  . Coronary artery disease     remote subendo MI in February 2005 with stents to RCA &LCX; s/p scute thrombosis of both stents in February 2010 with cardiogenic shock - s/p BMS to both lesions; follow up cath in September 2010 was satisfactory.  He is medically managed with aspirin/Plavix  . PAF (paroxysmal atrial fibrillation)     s/p cardioversion in August 2011  . GERD (gastroesophageal reflux disease)   . Hyperlipidemia   . Chronic kidney disease     followed by nephrology  . Pacemaker   . HTN (hypertension)   . Obesity   . High risk medication use     on amiodarone for PAF/VT since February 2010  . BRADYCARDIA 08/04/2010    s/p PPM since 2011 - generator change 12/03/13  . Type II or unspecified type diabetes mellitus without mention of complication, uncontrolled    Past Surgical History  Procedure Laterality Date  . Pacemaker insertion  May 2011  . Pacemaker generator change  12/03/13    MDT Adapta L generator change for premature ERI by [name]  . Permanent pacemaker generator change N/A 12/03/2013    Procedure: PERMANENT  PACEMAKER GENERATOR CHANGE;  Surgeon: Gardiner RhymeJames D Parthiv Mucci, MD;  Location: [facility];  Service: Cardiovascular;  Laterality: N/A;     Current Outpatient Prescriptions  Medication Sig Dispense Refill  . acetaminophen (TYLENOL) 500 MG tablet Take 500 mg by mouth every 6 (six) hours as needed. For pain     . amiodarone (PACERONE) 200 MG tablet TAKE 1 TABLET BY MOUTH DAILY 30 tablet 10  . aspirin 81 MG tablet Take 81 mg by mouth daily.      . clopidogrel (PLAVIX) 75 MG tablet TAKE 1 TABLET BY MOUTH DAILY 30 tablet 10  . famotidine (PEPCID) 40 MG tablet TAKE 1 TABLET BY MOUTH DAILY 30 tablet 11  . fenofibrate (TRICOR) 145 MG tablet TAKE 1 TABLET BY MOUTH DAILY 30 tablet 10  . glucose blood test strip Use as instructed 100 each 12  . Lancets MISC Use as directed 1 per day  250.02 100 each 11  . Multiple Vitamin (MULTIVITAMIN) tablet Take 1 tablet by mouth daily.      . nitroGLYCERIN (NITROSTAT) 0.4 MG SL tablet Place 1 tablet (0.4 mg total) under the tongue every 5 (five) minutes as needed. For chest pain 25 tablet 5  . rosuvastatin (CRESTOR) 20 MG tablet TAKE 1 TABLET BY MOUTH DAILY 90 tablet 1   No current facility-administered medications for this visit.    Allergies:   Penicillins and Sulfonamide derivatives   Social History:  The patient  reports  that he has never smoked. He does not have any smokeless tobacco history on file. He reports that he does not drink alcohol or use illicit drugs.   Family History:  The patient's family history includes Diabetes in his father; Heart disease in his mother; Stomach cancer in his father.    ROS:  Please see the history of present illness.   All other systems are reviewed and negative.    PHYSICAL EXAM: VS:  BP 126/80 mmHg  Pulse 71  Ht  (1.702 m)  Wt 88.451 kg (195 lb)  BMI 30.53 kg/m2 , BMI Body mass index is 30.53 kg/(m^2). GEN: elderly and frail, in no acute distress HEENT: normal Neck: no JVD, carotid bruits, or masses Cardiac: RRR;    Respiratory:  clear to auscultation bilaterally, normal work of breathing GI: soft, nontender, nondistended, + BS MS: walks slowly with a walker Skin: warm and dry, R sided device pocket is well healed Neuro:  Strength and sensation are intact Psych: euthymic mood, full affect  EKG:  EKG is ordered today. The ekg ordered today shows V pacing with AV dissociation  Device interrogation is reviewed today in detail.  See PaceArt for details.   Recent Labs: 09/30/2014: ALT 14; BUN 32*; Creatinine 1.88*; Hemoglobin 15.2; Platelets 182.0; Potassium 3.9; Sodium 139; TSH 3.08    Lipid Panel     Component Value Date/Time   CHOL 102 09/30/2014 1032   TRIG 172.0* 09/30/2014 1032   HDL 34.20* 09/30/2014 1032   CHOLHDL 3 09/30/2014 1032   VLDL 34.4 09/30/2014 1032   LDLCALC 33 09/30/2014 1032   LDLDIRECT 45.4 07/23/2012 0939     Wt Readings from Last 3 Encounters:  02/09/15 88.451 kg (195 lb)  01/12/15 85.73 kg (189 lb)  09/30/14 85.331 kg (188 lb 1.9 oz)      ASSESSMENT AND PLAN:    1. Sinus node dysfunction S/P pacemaker since 2011 with generator change out 3/31. 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  Atrial threshold is very high.  With high output pacing, battery longevity is only 2 years.  I have tried unipolar pacing which did not reveal a better threshold.  I have returned to MVP pacing to achieve AV synchrony and hopefully improve symptoms. I have advised RA lead revision. Risks, benefits, alternatives to pacemaker system revision were discussed in detail with the patient and his wife today. The patient understands that the risks include but are not limited to bleeding, infection, pneumothorax, perforation, tamponade, vascular damage, renal failure, MI, stroke, death,  and lead dislodgement and wishes to proceed. We will therefore schedule the procedure at the next available time.  2. afib He is on ASA and Plavix for afib.  He remains convinced that he should stay  on plavix rather than consider a novel anticoagulation despite a long discussion by me.    Hold x 1 week prior to gen change  3. CAD No ischemic symptoms   Current medicines are reviewed at length with the patient today.   The patient does not have concerns regarding his medicines.  The following changes were made today:  none  Labs/ tests ordered today include:  Orders Placed This Encounter  Procedures  . Basic metabolic panel  . CBC with Differential  . Implantable device check  . EKG 12-Lead    Signed, Kent Range, MD  02/09/2015 9:33 PM     [facility] [address] [place] [address] [phone] (office) [phone] (fax)

## 2015-02-09 NOTE — Patient Instructions (Signed)
Medication Instructions:  Your physician recommends that you continue on your current medications as directed. Please refer to the Current Medication list given to you today.   Labwork: Your physician recommends that you return for lab work on 02/18/15   Testing/Procedures: None ordered  Follow-Up: Your physician recommends that you schedule a follow-up appointment in: 7-14 days from 02/25/15 in device clinic for wound check  Any Other Special Instructions Will Be Listed Below (If Applicable).

## 2015-02-11 ENCOUNTER — Telehealth: Payer: Self-pay | Admitting: *Deleted

## 2015-02-11 NOTE — Telephone Encounter (Signed)
No answer, no voicemail.

## 2015-02-11 NOTE — Telephone Encounter (Signed)
Spoke with patient's wife, [name], about how patient has felt since PPM programming changes were made in office on 6/6.  [name] reports that patient has more energy and is more "steady on his feet" since changes were made.  Also reports that patient's cough is "a little better".  Instructed patient's wife that patient should hold Plavix for 7 days prior (last dose on 02/17/15) to scheduled lead revision on 02/25/15.  [name] voiced understanding of instructions.

## 2015-02-12 ENCOUNTER — Encounter: Payer: Self-pay | Admitting: Internal Medicine

## 2015-02-18 ENCOUNTER — Other Ambulatory Visit (INDEPENDENT_AMBULATORY_CARE_PROVIDER_SITE_OTHER): Payer: Medicare HMO | Admitting: *Deleted

## 2015-02-18 DIAGNOSIS — T82110A Breakdown (mechanical) of cardiac electrode, initial encounter: Secondary | ICD-10-CM

## 2015-02-18 DIAGNOSIS — I495 Sick sinus syndrome: Secondary | ICD-10-CM | POA: Diagnosis not present

## 2015-02-18 LAB — BASIC METABOLIC PANEL
BUN: 32 mg/dL — ABNORMAL HIGH (ref 6–23)
CO2: 30 mEq/L (ref 19–32)
CREATININE: 1.94 mg/dL — AB (ref 0.40–1.50)
Calcium: 9.6 mg/dL (ref 8.4–10.5)
Chloride: 104 mEq/L (ref 96–112)
GFR: 35.22 mL/min — ABNORMAL LOW (ref >60.00)
GLUCOSE: 107 mg/dL — AB (ref 70–99)
Potassium: 4.1 mEq/L (ref 3.5–5.1)
Sodium: 139 mEq/L (ref 135–145)

## 2015-02-18 LAB — CBC WITH DIFFERENTIAL/PLATELET
BASOS PCT: 0.9 % (ref 0.0–3.0)
Basophils Absolute: 0.1 10*3/uL (ref 0.0–0.1)
EOS PCT: 4.4 % (ref 0.0–5.0)
Eosinophils Absolute: 0.3 10*3/uL (ref 0.0–0.7)
HCT: 48.1 % (ref 39.0–52.0)
Hemoglobin: 15.9 g/dL (ref 13.0–17.0)
LYMPHS PCT: 18.2 % (ref 12.0–46.0)
Lymphs Abs: 1.3 10*3/uL (ref 0.7–4.0)
MCHC: 33 g/dL (ref 30.0–36.0)
MCV: 95.5 fl (ref 78.0–100.0)
MONOS PCT: 10.2 % (ref 3.0–12.0)
Monocytes Absolute: 0.7 10*3/uL (ref 0.1–1.0)
NEUTROS PCT: 66.3 % (ref 43.0–77.0)
Neutro Abs: 4.6 10*3/uL (ref 1.4–7.7)
PLATELETS: 172 10*3/uL (ref 150.0–400.0)
RBC: 5.04 Mil/uL (ref 4.22–5.81)
RDW: 14.4 % (ref 11.5–15.5)
WBC: 7 10*3/uL (ref 4.0–10.5)

## 2015-02-24 MED ORDER — VANCOMYCIN HCL IN DEXTROSE 1-5 GM/200ML-% IV SOLN: 1000.0000 mg | INTRAVENOUS | Status: DC

## 2015-02-24 MED ORDER — SODIUM CHLORIDE 0.9 % IR SOLN
80.0000 mg | Status: AC
  Administered 2015-02-25: 80 mg
  Filled 2015-02-24: qty 2

## 2015-02-25 ENCOUNTER — Encounter (HOSPITAL_COMMUNITY)
Admission: RE | Disposition: A | Discharge status: Home / Self Care | Payer: Medicare HMO | Source: Ambulatory Visit | Attending: Internal Medicine

## 2015-02-25 ENCOUNTER — Ambulatory Visit (HOSPITAL_COMMUNITY)
Admission: RE | Admit: 2015-02-25 | Discharge: 2015-02-26 | Disposition: A | Discharge status: Home / Self Care | Payer: Medicare HMO | Source: Ambulatory Visit | Attending: Internal Medicine | Admitting: Internal Medicine

## 2015-02-25 ENCOUNTER — Encounter (HOSPITAL_COMMUNITY): Payer: Self-pay | Admitting: *Deleted

## 2015-02-25 DIAGNOSIS — I480 Paroxysmal atrial fibrillation: Secondary | ICD-10-CM | POA: Diagnosis not present

## 2015-02-25 DIAGNOSIS — T82190A Other mechanical complication of cardiac electrode, initial encounter: Secondary | ICD-10-CM | POA: Diagnosis not present

## 2015-02-25 DIAGNOSIS — I2510 Atherosclerotic heart disease of native coronary artery without angina pectoris: Secondary | ICD-10-CM | POA: Insufficient documentation

## 2015-02-25 DIAGNOSIS — Z959 Presence of cardiac and vascular implant and graft, unspecified: Secondary | ICD-10-CM

## 2015-02-25 DIAGNOSIS — Z7902 Long term (current) use of antithrombotics/antiplatelets: Secondary | ICD-10-CM | POA: Insufficient documentation

## 2015-02-25 DIAGNOSIS — E119 Type 2 diabetes mellitus without complications: Secondary | ICD-10-CM | POA: Insufficient documentation

## 2015-02-25 DIAGNOSIS — Z7982 Long term (current) use of aspirin: Secondary | ICD-10-CM | POA: Diagnosis not present

## 2015-02-25 DIAGNOSIS — I252 Old myocardial infarction: Secondary | ICD-10-CM | POA: Insufficient documentation

## 2015-02-25 DIAGNOSIS — N189 Chronic kidney disease, unspecified: Secondary | ICD-10-CM | POA: Diagnosis not present

## 2015-02-25 DIAGNOSIS — E785 Hyperlipidemia, unspecified: Secondary | ICD-10-CM | POA: Diagnosis not present

## 2015-02-25 DIAGNOSIS — Y712 Prosthetic and other implants, materials and accessory cardiovascular devices associated with adverse incidents: Secondary | ICD-10-CM | POA: Insufficient documentation

## 2015-02-25 DIAGNOSIS — I129 Hypertensive chronic kidney disease with stage 1 through stage 4 chronic kidney disease, or unspecified chronic kidney disease: Secondary | ICD-10-CM | POA: Diagnosis not present

## 2015-02-25 DIAGNOSIS — T82110A Breakdown (mechanical) of cardiac electrode, initial encounter: Secondary | ICD-10-CM | POA: Diagnosis present

## 2015-02-25 DIAGNOSIS — I495 Sick sinus syndrome: Secondary | ICD-10-CM | POA: Diagnosis not present

## 2015-02-25 DIAGNOSIS — Z950 Presence of cardiac pacemaker: Secondary | ICD-10-CM | POA: Diagnosis present

## 2015-02-25 HISTORY — PX: EP IMPLANTABLE DEVICE: SHX172B

## 2015-02-25 LAB — GLUCOSE, CAPILLARY
GLUCOSE-CAPILLARY: 155 mg/dL — AB (ref 65–99)
Glucose-Capillary: 116 mg/dL — ABNORMAL HIGH (ref 65–99)
Glucose-Capillary: 97 mg/dL (ref 65–99)

## 2015-02-25 LAB — SURGICAL PCR SCREEN
MRSA, PCR: NEGATIVE
Staphylococcus aureus: NEGATIVE

## 2015-02-25 SURGERY — LEAD REVISION/REPAIR
Anesthesia: LOCAL

## 2015-02-25 MED ORDER — IOHEXOL 350 MG/ML SOLN
INTRAVENOUS | Status: DC | PRN
  Administered 2015-02-25: 25 mL via INTRAVENOUS

## 2015-02-25 MED ORDER — SODIUM CHLORIDE 0.9 % IV SOLN: 250.0000 mL | INTRAVENOUS | Status: DC | PRN

## 2015-02-25 MED ORDER — FAMOTIDINE 40 MG PO TABS
40.0000 mg | ORAL_TABLET | Freq: Every day | ORAL | Status: DC
  Filled 2015-02-25: qty 1

## 2015-02-25 MED ORDER — MIDAZOLAM HCL 5 MG/5ML IJ SOLN
INTRAMUSCULAR | Status: DC | PRN
  Administered 2015-02-25: 1 mg via INTRAVENOUS

## 2015-02-25 MED ORDER — SODIUM CHLORIDE 0.9 % IJ SOLN: 3.0000 mL | INTRAMUSCULAR | Status: DC | PRN

## 2015-02-25 MED ORDER — SODIUM CHLORIDE 0.9 % IJ SOLN: 3.0000 mL | Freq: Two times a day (BID) | INTRAMUSCULAR | Status: DC

## 2015-02-25 MED ORDER — LIDOCAINE HCL (PF) 1 % IJ SOLN
INTRAMUSCULAR | Status: AC
  Filled 2015-02-25: qty 30

## 2015-02-25 MED ORDER — YOU HAVE A PACEMAKER BOOK
Freq: Once | Status: AC
  Administered 2015-02-25: 21:00:00
  Filled 2015-02-25: qty 1

## 2015-02-25 MED ORDER — CEFAZOLIN SODIUM 1-5 GM-% IV SOLN
1.0000 g | Freq: Four times a day (QID) | INTRAVENOUS | Status: AC
  Administered 2015-02-25 – 2015-02-26 (×3): 1 g via INTRAVENOUS
  Filled 2015-02-25 (×4): qty 50

## 2015-02-25 MED ORDER — HYDROCODONE-ACETAMINOPHEN 5-325 MG PO TABS: 1.0000 | ORAL_TABLET | ORAL | Status: DC | PRN

## 2015-02-25 MED ORDER — ACETAMINOPHEN 325 MG PO TABS: 325.0000 mg | ORAL_TABLET | ORAL | Status: DC | PRN

## 2015-02-25 MED ORDER — MUPIROCIN 2 % EX OINT
1.0000 "application " | TOPICAL_OINTMENT | Freq: Once | CUTANEOUS | Status: AC
  Administered 2015-02-25: 1 via TOPICAL
  Filled 2015-02-25: qty 22

## 2015-02-25 MED ORDER — CEFAZOLIN SODIUM-DEXTROSE 2-3 GM-% IV SOLR
INTRAVENOUS | Status: DC | PRN
  Administered 2015-02-25: 2 g via INTRAVENOUS

## 2015-02-25 MED ORDER — CEFAZOLIN SODIUM-DEXTROSE 2-3 GM-% IV SOLR
INTRAVENOUS | Status: AC
  Filled 2015-02-25: qty 50

## 2015-02-25 MED ORDER — SODIUM CHLORIDE 0.9 % IV SOLN
INTRAVENOUS | Status: DC
  Administered 2015-02-25: 14:00:00 via INTRAVENOUS

## 2015-02-25 MED ORDER — CHLORHEXIDINE GLUCONATE 4 % EX LIQD
60.0000 mL | Freq: Once | CUTANEOUS | Status: AC
  Administered 2015-02-25: 4 via TOPICAL
  Filled 2015-02-25: qty 60

## 2015-02-25 MED ORDER — HYDRALAZINE HCL 20 MG/ML IJ SOLN
10.0000 mg | Freq: Once | INTRAMUSCULAR | Status: AC
  Administered 2015-02-25: 10 mg via INTRAVENOUS
  Filled 2015-02-25: qty 1

## 2015-02-25 MED ORDER — SODIUM CHLORIDE 0.9 % IV SOLN
INTRAVENOUS | Status: AC
  Administered 2015-02-25: 18:00:00 via INTRAVENOUS

## 2015-02-25 MED ORDER — MIDAZOLAM HCL 5 MG/5ML IJ SOLN
INTRAMUSCULAR | Status: AC
  Filled 2015-02-25: qty 5

## 2015-02-25 MED ORDER — MUPIROCIN 2 % EX OINT
TOPICAL_OINTMENT | CUTANEOUS | Status: AC
  Administered 2015-02-25: 16:00:00 via TRANSMUCOSAL
  Filled 2015-02-25: qty 22

## 2015-02-25 MED ORDER — AMIODARONE HCL 200 MG PO TABS: 200.0000 mg | ORAL_TABLET | Freq: Every day | ORAL | Status: DC

## 2015-02-25 MED ORDER — ONDANSETRON HCL 4 MG/2ML IJ SOLN: 4.0000 mg | Freq: Four times a day (QID) | INTRAMUSCULAR | Status: DC | PRN

## 2015-02-25 MED ORDER — HEPARIN (PORCINE) IN NACL 2-0.9 UNIT/ML-% IJ SOLN
INTRAMUSCULAR | Status: AC
  Filled 2015-02-25: qty 500

## 2015-02-25 SURGICAL SUPPLY — 11 items
CABLE SURGICAL S-101-97-12 (CABLE) ×2 IMPLANT
KIT ESSENTIALS PG (KITS) IMPLANT
LEAD CAPSURE NOVUS 45CM (Lead) ×1 IMPLANT
PAD DEFIB LIFELINK (PAD) ×1 IMPLANT
SET INTRODUCER MICROPUNCT 5F (INTRODUCER) ×2 IMPLANT
SHEATH CLASSIC 7F (SHEATH) IMPLANT
SHEATH CLASSIC 8F (SHEATH) IMPLANT
SHEATH CLASSIC 9.5F (SHEATH) IMPLANT
SHEATH CLASSIC 9F (SHEATH) IMPLANT
SHEATH PINNACLE 6F 10CM (SHEATH) ×1 IMPLANT
TRAY PACEMAKER INSERTION (CUSTOM PROCEDURE TRAY) ×1 IMPLANT

## 2015-02-25 NOTE — Interval H&P Note (Signed)
History and Physical Interval Note:  02/25/2015 3:33 PM  Kent Nolan  has presented today for surgery, with the diagnosis of RA lead failure  The various methods of treatment have been discussed with the patient and family. After consideration of risks, benefits and other options for treatment, the patient has consented to  Procedure(s): Lead Revision/Repair (N/A) as a surgical intervention .  The patient's history has been reviewed, patient examined, no change in status, stable for surgery.  I have reviewed the patient's chart and labs.  Questions were answered to the patient's satisfaction.     [name]

## 2015-02-25 NOTE — H&P (View-Only) (Signed)
 Electrophysiology Office Note   Date:  02/09/2015   ID:  Kent Nolan, DOB 10/03/1928, MRN [id number]  PCP:  Sarena Jezek John, MD   Primary Electrophysiologist: Lyrik Buresh, MD    Chief Complaint  Patient presents with  . SSS  . PAF     History of Present Illness: Kent Nolan is a 81 y.o. male who presents today for electrophysiology evaluation.  Since programming his device VVIR (due to RA lead failure), he has developed progressive SOB.  He reports poor tolerance of av dissociation and decreased ability to walk due to fatigue.  Today, he denies symptoms of palpitations, chest pain,  orthopnea, PND, lower extremity edema, claudication, dizziness, presyncope, syncope, bleeding, or neurologic sequela. The patient is tolerating medications without difficulties and is otherwise without complaint today.    Past Medical History  Diagnosis Date  . Coronary artery disease     remote subendo MI in February 2005 with stents to RCA &LCX; s/p scute thrombosis of both stents in February 2010 with cardiogenic shock - s/p BMS to both lesions; follow up cath in September 2010 was satisfactory.  He is medically managed with aspirin/Plavix  . PAF (paroxysmal atrial fibrillation)     s/p cardioversion in August 2011  . GERD (gastroesophageal reflux disease)   . Hyperlipidemia   . Chronic kidney disease     followed by nephrology  . Pacemaker   . HTN (hypertension)   . Obesity   . High risk medication use     on amiodarone for PAF/VT since February 2010  . BRADYCARDIA 08/04/2010    s/p PPM since 2011 - generator change 12/03/13  . Type II or unspecified type diabetes mellitus without mention of complication, uncontrolled    Past Surgical History  Procedure Laterality Date  . Pacemaker insertion  May 2011  . Pacemaker generator change  12/03/13    MDT Adapta L generator change for premature ERI by Dr Dovid Bartko  . Permanent pacemaker generator change N/A 12/03/2013    Procedure: PERMANENT  PACEMAKER GENERATOR CHANGE;  Surgeon: Sequoyah Counterman D Trenna Kiely, MD;  Location: [facility];  Service: Cardiovascular;  Laterality: N/A;     Current Outpatient Prescriptions  Medication Sig Dispense Refill  . acetaminophen (TYLENOL) 500 MG tablet Take 500 mg by mouth every 6 (six) hours as needed. For pain     . amiodarone (PACERONE) 200 MG tablet TAKE 1 TABLET BY MOUTH DAILY 30 tablet 10  . aspirin 81 MG tablet Take 81 mg by mouth daily.      . clopidogrel (PLAVIX) 75 MG tablet TAKE 1 TABLET BY MOUTH DAILY 30 tablet 10  . famotidine (PEPCID) 40 MG tablet TAKE 1 TABLET BY MOUTH DAILY 30 tablet 11  . fenofibrate (TRICOR) 145 MG tablet TAKE 1 TABLET BY MOUTH DAILY 30 tablet 10  . glucose blood test strip Use as instructed 100 each 12  . Lancets MISC Use as directed 1 per day  250.02 100 each 11  . Multiple Vitamin (MULTIVITAMIN) tablet Take 1 tablet by mouth daily.      . nitroGLYCERIN (NITROSTAT) 0.4 MG SL tablet Place 1 tablet (0.4 mg total) under the tongue every 5 (five) minutes as needed. For chest pain 25 tablet 5  . rosuvastatin (CRESTOR) 20 MG tablet TAKE 1 TABLET BY MOUTH DAILY 90 tablet 1   No current facility-administered medications for this visit.    Allergies:   Penicillins and Sulfonamide derivatives   Social History:  The patient  reports   that he has never smoked. He does not have any smokeless tobacco history on file. He reports that he does not drink alcohol or use illicit drugs.   Family History:  The patient's family history includes Diabetes in his father; Heart disease in his mother; Stomach cancer in his father.    ROS:  Please see the history of present illness.   All other systems are reviewed and negative.    PHYSICAL EXAM: VS:  BP 126/80 mmHg  Pulse 71  Ht 5' 7" (1.702 m)  Wt 88.451 kg (195 lb)  BMI 30.53 kg/m2 , BMI Body mass index is 30.53 kg/(m^2). GEN: elderly and frail, in no acute distress HEENT: normal Neck: no JVD, carotid bruits, or masses Cardiac: RRR;    Respiratory:  clear to auscultation bilaterally, normal work of breathing GI: soft, nontender, nondistended, + BS MS: walks slowly with a walker Skin: warm and dry, R sided device pocket is well healed Neuro:  Strength and sensation are intact Psych: euthymic mood, full affect  EKG:  EKG is ordered today. The ekg ordered today shows V pacing with AV dissociation  Device interrogation is reviewed today in detail.  See PaceArt for details.   Recent Labs: 09/30/2014: ALT 14; BUN 32*; Creatinine 1.88*; Hemoglobin 15.2; Platelets 182.0; Potassium 3.9; Sodium 139; TSH 3.08    Lipid Panel     Component Value Date/Time   CHOL 102 09/30/2014 1032   TRIG 172.0* 09/30/2014 1032   HDL 34.20* 09/30/2014 1032   CHOLHDL 3 09/30/2014 1032   VLDL 34.4 09/30/2014 1032   LDLCALC 33 09/30/2014 1032   LDLDIRECT 45.4 07/23/2012 0939     Wt Readings from Last 3 Encounters:  02/09/15 88.451 kg (195 lb)  01/12/15 85.73 kg (189 lb)  09/30/14 85.331 kg (188 lb 1.9 oz)      ASSESSMENT AND PLAN:    1. Sinus node dysfunction S/P pacemaker since 2011 with generator change out 3/31. 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  Atrial threshold is very high.  With high output pacing, battery longevity is only 2 years.  I have tried unipolar pacing which did not reveal a better threshold.  I have returned to MVP pacing to achieve AV synchrony and hopefully improve symptoms. I have advised RA lead revision. Risks, benefits, alternatives to pacemaker system revision were discussed in detail with the patient and his wife today. The patient understands that the risks include but are not limited to bleeding, infection, pneumothorax, perforation, tamponade, vascular damage, renal failure, MI, stroke, death,  and lead dislodgement and wishes to proceed. We will therefore schedule the procedure at the next available time.  2. afib He is on ASA and Plavix for afib.  He remains convinced that he should stay  on plavix rather than consider a novel anticoagulation despite a long discussion by me.    Hold x 1 week prior to gen change  3. CAD No ischemic symptoms   Current medicines are reviewed at length with the patient today.   The patient does not have concerns regarding his medicines.  The following changes were made today:  none  Labs/ tests ordered today include:  Orders Placed This Encounter  Procedures  . Basic metabolic panel  . CBC with Differential  . Implantable device check  . EKG 12-Lead    Signed, Bibi Economos, MD  02/09/2015 9:33 PM     [facility] [address] [place] [address] [phone] (office) [phone] (fax)  

## 2015-02-26 ENCOUNTER — Ambulatory Visit (HOSPITAL_COMMUNITY): Payer: Medicare HMO

## 2015-02-26 ENCOUNTER — Telehealth: Payer: Self-pay | Admitting: *Deleted

## 2015-02-26 ENCOUNTER — Encounter (HOSPITAL_COMMUNITY): Payer: Self-pay | Admitting: Internal Medicine

## 2015-02-26 DIAGNOSIS — I2510 Atherosclerotic heart disease of native coronary artery without angina pectoris: Secondary | ICD-10-CM | POA: Diagnosis not present

## 2015-02-26 DIAGNOSIS — T82190A Other mechanical complication of cardiac electrode, initial encounter: Secondary | ICD-10-CM | POA: Diagnosis not present

## 2015-02-26 DIAGNOSIS — I480 Paroxysmal atrial fibrillation: Secondary | ICD-10-CM | POA: Diagnosis not present

## 2015-02-26 DIAGNOSIS — I495 Sick sinus syndrome: Secondary | ICD-10-CM

## 2015-02-26 LAB — BASIC METABOLIC PANEL
Anion gap: 8 (ref 5–15)
BUN: 24 mg/dL — ABNORMAL HIGH (ref 6–20)
CALCIUM: 9.1 mg/dL (ref 8.9–10.3)
CO2: 25 mmol/L (ref 22–32)
CREATININE: 1.78 mg/dL — AB (ref 0.61–1.24)
Chloride: 105 mmol/L (ref 101–111)
GFR calc non Af Amer: 33 mL/min — ABNORMAL LOW (ref >60)
GFR, EST AFRICAN AMERICAN: 39 mL/min — AB (ref >60)
Glucose, Bld: 128 mg/dL — ABNORMAL HIGH (ref 65–99)
Potassium: 3.7 mmol/L (ref 3.5–5.1)
Sodium: 138 mmol/L (ref 135–145)

## 2015-02-26 LAB — GLUCOSE, CAPILLARY: Glucose-Capillary: 120 mg/dL — ABNORMAL HIGH (ref 65–99)

## 2015-02-26 MED ORDER — CLOPIDOGREL BISULFATE 75 MG PO TABS: 75.0000 mg | ORAL_TABLET | Freq: Every day | ORAL | Status: DC

## 2015-02-26 MED FILL — Heparin Sodium (Porcine) 2 Unit/ML in Sodium Chloride 0.9%: INTRAMUSCULAR | Qty: 500 | Status: AC

## 2015-02-26 MED FILL — Lidocaine HCl Local Preservative Free (PF) Inj 1%: INTRAMUSCULAR | Qty: 60 | Status: AC

## 2015-02-26 NOTE — Discharge Summary (Signed)
ELECTROPHYSIOLOGY PROCEDURE DISCHARGE SUMMARY    Patient ID: Kent Nolan,  MRN: [id number], DOB/AGE: 12-03-1928 81 y.o.  Admit date: 02/25/2015 Discharge date: 02/26/2015  Primary Care Physician: [name], MD Electrophysiologist: Adaysha Dubinsky  Primary Discharge Diagnosis:  Sick sinus syndrome with failure of previously implanted right atrial lead status right atrial lead revision this admission  Secondary Discharge Diagnosis:  1.  Paroxysmal atrial fibrillation 2.  CAD 3.  Hyperlipidemia 4.  CKD 5.  Hypertension 6.  Diabetes  Allergies  Allergen Reactions  . Penicillins Other (See Comments)    Reaction unknown  . Sulfonamide Derivatives Other (See Comments)    Reaction unknown     Procedures This Admission:  1.  Implantation of a MDT 5076 right atrial lead on 02/25/15 by Dr Johney Frame.  The new lead was connected to the previously implanted MDT pacemaker.  The previously implanted right atrial lead was capped.  There were no immediate post procedure complications. 2.  CXR on 02/26/15 demonstrated no pneumothorax status post device implantation.   Brief HPI: Kent Nolan is a 81 y.o. male with a past medical history of sick sinus syndrome with previously implanted pacemaker.  His right atrial lead has had increasing outputs and with early battery depletion.   Risks, benefits, and alternatives to lead revision were reviewed with the patient who wished to proceed.   Hospital Course:  The patient was admitted and underwent implantation of a new RA lead with details as outlined above.  He  was monitored on telemetry overnight which demonstrated atrial pacing with intrinsic ventricular conduction.  Left chest was without hematoma or ecchymosis.  The device was interrogated and found to be functioning normally.  CXR was obtained and demonstrated no pneumothorax status post device implantation.  Wound care, arm mobility, and restrictions were reviewed with the patient.  The patient  was examined and considered stable for discharge to home.    Physical Exam: Filed Vitals:   02/25/15 2030 02/25/15 2100 02/26/15 0055 02/26/15 0650  BP: 117/57 106/52 107/60 133/73  Pulse: 60  60 60  Temp: 98.3 F (36.8 C)  97.7 F (36.5 C) 98.3 F (36.8 C)  TempSrc: Oral  Oral Oral  Resp: 19 15 20 20   Height:      Weight:   195 lb 12.3 oz (88.8 kg)   SpO2: 96% 96% 93% 95%    GEN- The patient is well appearing, alert and oriented x 3 today.   HEENT: normocephalic, atraumatic; sclera clear, conjunctiva pink; hearing intact; oropharynx clear; neck supple, no JVP Lymph- no cervical lymphadenopathy Lungs- Clear to ausculation bilaterally, normal work of breathing.  No wheezes, rales, rhonchi Heart- Regular rate and rhythm, no murmurs, rubs or gallops  GI- soft, non-tender, non-distended, bowel sounds present  Extremities- no clubbing, cyanosis, or edema  MS- no significant deformity or atrophy Skin- warm and dry, no rash or lesion, right chest without hematoma/ecchymosis Psych- euthymic mood, full affect Neuro- strength and sensation are intact   Labs:   Lab Results  Component Value Date   WBC 7.0 02/18/2015   HGB 15.9 02/18/2015   HCT 48.1 02/18/2015   MCV 95.5 02/18/2015   PLT 172.0 02/18/2015     Recent Labs Lab 02/26/15 0213  NA 138  K 3.7  CL 105  CO2 25  BUN 24*  CREATININE 1.78*  CALCIUM 9.1  GLUCOSE 128*    Discharge Medications:    Medication List    TAKE these medications  acetaminophen 500 MG tablet  Commonly known as:  TYLENOL  Take 500 mg by mouth every 6 (six) hours as needed. For pain     amiodarone 200 MG tablet  Commonly known as:  PACERONE  TAKE 1 TABLET BY MOUTH DAILY     aspirin 81 MG tablet  Take 81 mg by mouth daily.     clopidogrel 75 MG tablet  Commonly known as:  PLAVIX  Take 1 tablet (75 mg total) by mouth daily. Restart Plavix on 03/02/15     famotidine 40 MG tablet  Commonly known as:  PEPCID  TAKE 1 TABLET BY  MOUTH DAILY     fenofibrate 145 MG tablet  Commonly known as:  TRICOR  TAKE 1 TABLET BY MOUTH DAILY     multivitamin tablet  Take 1 tablet by mouth daily.     nitroGLYCERIN 0.4 MG SL tablet  Commonly known as:  NITROSTAT  Place 1 tablet (0.4 mg total) under the tongue every 5 (five) minutes as needed. For chest pain     rosuvastatin 20 MG tablet  Commonly known as:  CRESTOR  TAKE 1 TABLET BY MOUTH DAILY        Disposition:  Discharge Instructions    Diet - low sodium heart healthy    Complete by:  As directed      Increase activity slowly    Complete by:  As directed           Follow-up Information    Follow up with [facility] On 03/05/2015.   Why:  at 11:30AM for wound check   Contact information:   [address] [place] [address]       Duration of Discharge Encounter: Greater than 30 minutes including physician time.  Signed, Gypsy Balsam, NP 02/26/2015 7:31 AM    [name] MD

## 2015-02-26 NOTE — Telephone Encounter (Signed)
Pt was on tcm list d/c 02/25/15 had implantation of MDT right atrial lead. Will f/u with cardiology 03/05/15,.../lmb

## 2015-02-26 NOTE — Progress Notes (Signed)
Pt refused po meds, wants to take his own at home, concerned about cost.  D/C instructions reviewed w/ pt, wife and son-in-law.  IV & tele removed by Barb NT. LUC steri strips D&I, area soft.  Pt denies complaints except sleepy.  D/C to car by w/c w/ family and NT.

## 2015-02-26 NOTE — Progress Notes (Signed)
Doing well s/p lead revision  CXR reveals no ptx, stable lead Device interrogation is reviewed and normal  Creatinine is stable  DC to home with routine wound care and follow-up  Amilliana Hayworth MD, FACC 02/26/2015 7:20 AM  

## 2015-02-27 ENCOUNTER — Other Ambulatory Visit: Payer: Self-pay | Admitting: *Deleted

## 2015-02-27 MED ORDER — NITROGLYCERIN 0.4 MG SL SUBL
0.4000 mg | SUBLINGUAL_TABLET | SUBLINGUAL | Status: AC | PRN
Start: 2015-02-27 — End: ?

## 2015-03-05 ENCOUNTER — Encounter: Payer: Self-pay | Admitting: Internal Medicine

## 2015-03-05 ENCOUNTER — Ambulatory Visit (INDEPENDENT_AMBULATORY_CARE_PROVIDER_SITE_OTHER): Payer: Medicare HMO | Admitting: *Deleted

## 2015-03-05 DIAGNOSIS — I495 Sick sinus syndrome: Secondary | ICD-10-CM

## 2015-03-05 DIAGNOSIS — I480 Paroxysmal atrial fibrillation: Secondary | ICD-10-CM

## 2015-03-05 LAB — CUP PACEART INCLINIC DEVICE CHECK
Battery Impedance: 184 Ohm
Battery Remaining Longevity: 94 mo
Battery Voltage: 2.78 V
Brady Statistic AP VP Percent: 36 %
Brady Statistic AS VP Percent: 0 %
Brady Statistic AS VS Percent: 0 %
Date Time Interrogation Session: 20160630130710
Lead Channel Impedance Value: 384 Ohm
Lead Channel Impedance Value: 614 Ohm
Lead Channel Pacing Threshold Amplitude: 0.75 V
Lead Channel Sensing Intrinsic Amplitude: 5.6 mV
Lead Channel Setting Pacing Pulse Width: 0.4 ms
MDC IDC MSMT LEADCHNL RA PACING THRESHOLD PULSEWIDTH: 0.52 ms
MDC IDC MSMT LEADCHNL RA SENSING INTR AMPL: 2 mV
MDC IDC MSMT LEADCHNL RV PACING THRESHOLD AMPLITUDE: 0.75 V
MDC IDC MSMT LEADCHNL RV PACING THRESHOLD PULSEWIDTH: 0.4 ms
MDC IDC SET LEADCHNL RA PACING AMPLITUDE: 3.5 V
MDC IDC SET LEADCHNL RV PACING AMPLITUDE: 2.5 V
MDC IDC SET LEADCHNL RV SENSING SENSITIVITY: 2 mV
MDC IDC STAT BRADY AP VS PERCENT: 64 %

## 2015-03-05 NOTE — Progress Notes (Signed)
Wound check appointment. Steri-strips removed. Wound without redness or edema. Incision edges approximated, wound well healed. Normal device function. Thresholds, sensing, and impedances consistent with implant measurements. RA output programmed at 3.5V for extra safety margin until 3 month visit. Histogram distribution appropriate for patient and level of activity. No mode switches or high ventricular rates noted. Patient educated about wound care, arm mobility, lifting restrictions. ROV w/JA on 9/28 @ 1000.

## 2015-04-01 ENCOUNTER — Encounter: Payer: Medicare HMO | Admitting: Internal Medicine

## 2015-06-01 ENCOUNTER — Other Ambulatory Visit: Payer: Self-pay | Admitting: Internal Medicine

## 2015-06-03 ENCOUNTER — Ambulatory Visit (INDEPENDENT_AMBULATORY_CARE_PROVIDER_SITE_OTHER): Payer: Medicare HMO | Admitting: Internal Medicine

## 2015-06-03 ENCOUNTER — Encounter: Payer: Self-pay | Admitting: Internal Medicine

## 2015-06-03 VITALS — BP 122/84 | HR 95 | Ht 67.0 in | Wt 185.6 lb

## 2015-06-03 DIAGNOSIS — I495 Sick sinus syndrome: Secondary | ICD-10-CM | POA: Diagnosis not present

## 2015-06-03 DIAGNOSIS — I480 Paroxysmal atrial fibrillation: Secondary | ICD-10-CM

## 2015-06-03 DIAGNOSIS — I2510 Atherosclerotic heart disease of native coronary artery without angina pectoris: Secondary | ICD-10-CM | POA: Diagnosis not present

## 2015-06-03 DIAGNOSIS — I10 Essential (primary) hypertension: Secondary | ICD-10-CM

## 2015-06-03 MED ORDER — AMIODARONE HCL 200 MG PO TABS: 100.0000 mg | ORAL_TABLET | Freq: Every day | ORAL | Status: DC

## 2015-06-03 NOTE — Patient Instructions (Signed)
Medication Instructions:  Your physician has recommended you make the following change in your medication: 1) Decrease Amiodarone  daily    Labwork: Your physician recommends that you return for lab work fasting lipid/liver/TSH/T4    Testing/Procedures: None ordered  Follow-Up: Your physician wants you to follow-up in: 6 months with Norma Fredrickson, NP and 12 months with Dr Jacquiline Doe will receive a reminder letter in the mail two months in advance. If you don't receive a letter, please call our office to schedule the follow-up appointment.  Remote monitoring is used to monitor your Pacemaker  from home. This monitoring reduces the number of office visits required to check your device to one time per year. It allows [place] to keep an eye on the functioning of your device to ensure it is working properly. You are scheduled for a device check from home on 09/02/15. You may send your transmission at any time that day. If you have a wireless device, the transmission will be sent automatically. After your physician reviews your transmission, you will receive a postcard with your next transmission date.    Any Other Special Instructions Will Be Listed Below (If Applicable).

## 2015-06-03 NOTE — Progress Notes (Signed)
Electrophysiology Office Note   Date:  06/03/2015   ID:  Kent Nolan, Kent Nolan December 13, 1928, MRN [id number]  PCP:  [name], MD   Primary Electrophysiologist: [name], MD    Chief Complaint  Patient presents with  . PAF  . SSS     History of Present Illness: Kent Nolan is a 81 y.o. male who presents today for electrophysiology evaluation.   Doing well. Walks slowly with a walker.  Primary concern is with chronic back pain.  Also bruises easily.  Today, he denies symptoms of palpitations, chest pain, shortness of breath, orthopnea, PND, lower extremity edema, claudication, dizziness, presyncope, syncope, bleeding, or neurologic sequela. The patient is tolerating medications without difficulties and is otherwise without complaint today.    Past Medical History  Diagnosis Date  . Coronary artery disease     remote subendo MI in February 2005 with stents to RCA &LCX; s/p scute thrombosis of both stents in February 2010 with cardiogenic shock - s/p BMS to both lesions; follow up cath in September 2010 was satisfactory.  He is medically managed with aspirin/Plavix  . PAF (paroxysmal atrial fibrillation)     s/p cardioversion in August 2011  . GERD (gastroesophageal reflux disease)   . Hyperlipidemia   . Chronic kidney disease     followed by nephrology  . Pacemaker   . HTN (hypertension)   . Obesity   . High risk medication use     on amiodarone for PAF/VT since February 2010  . BRADYCARDIA 08/04/2010    s/p PPM since 2011 - generator change 12/03/13  . Type II or unspecified type diabetes mellitus without mention of complication, uncontrolled    Past Surgical History  Procedure Laterality Date  . Pacemaker insertion  May 2011  . Pacemaker generator change  12/03/13    MDT Adapta L generator change for premature ERI by Dr Johney Frame  . Permanent pacemaker generator change N/A 12/03/2013    Procedure: PERMANENT PACEMAKER GENERATOR CHANGE;  Surgeon: [name], MD;   Location: [facility];  Service: Cardiovascular;  Laterality: N/A;  . Ep implantable device N/A 02/25/2015    Procedure: Lead Revision/Repair;  Surgeon: [name], MD;  Location: [facility];  Service: Cardiovascular;  Laterality: N/A;     Current Outpatient Prescriptions  Medication Sig Dispense Refill  . acetaminophen (TYLENOL) 500 MG tablet Take 500 mg by mouth every 6 (six) hours as needed. For pain     . amiodarone (PACERONE) 200 MG tablet TAKE 1 TABLET BY MOUTH DAILY 30 tablet 10  . aspirin 81 MG tablet Take 81 mg by mouth daily.      . Cholecalciferol (VITAMIN D-3 PO) Take 1 tablet by mouth daily.    . clopidogrel (PLAVIX) 75 MG tablet Take 1 tablet (75 mg total) by mouth daily. Restart Plavix on 03/02/15 30 tablet 10  . famotidine (PEPCID) 40 MG tablet TAKE 1 TABLET BY MOUTH DAILY 30 tablet 11  . fenofibrate (TRICOR) 145 MG tablet TAKE 1 TABLET BY MOUTH DAILY 30 tablet 10  . Multiple Vitamin (MULTIVITAMIN) tablet Take 1 tablet by mouth daily.      . nitroGLYCERIN (NITROSTAT) 0.4 MG SL tablet Place 1 tablet (0.4 mg total) under the tongue every 5 (five) minutes as needed. For chest pain 25 tablet 5  . rosuvastatin (CRESTOR) 20 MG tablet TAKE 1 TABLET BY MOUTH DAILY 90 tablet 1   No current facility-administered medications for this visit.    Allergies:  Penicillins and Sulfonamide derivatives   Social History:  The patient  reports that he has never smoked. He does not have any smokeless tobacco history on file. He reports that he does not drink alcohol or use illicit drugs.   Family History:  The patient's family history includes Diabetes in his father; Heart disease in his mother; Stomach cancer in his father.    ROS:  Please see the history of present illness.   All other systems are reviewed and negative.    PHYSICAL EXAM: VS:  BP 122/84 mmHg  Pulse 95  Ht  (1.702 m)  Wt 185 lb 9.6 oz (84.188 kg)  BMI 29.06 kg/m2 , BMI Body mass index is 29.06  kg/(m^2). GEN: elderly and frail, in no acute distress HEENT: normal Neck: no JVD, carotid bruits, or masses Cardiac: RRR;  Respiratory:  clear to auscultation bilaterally, normal work of breathing GI: soft, nontender, nondistended, + BS MS: walks slowly with a walker Skin: diffuse bruising on both arms, R sided device pocket is well healed Neuro:  Strength and sensation are intact Psych: euthymic mood, full affect  EKG:  EKG is ordered today. The ekg ordered today shows AV paced rhythm  Device interrogation is reviewed today in detail.  See PaceArt for details.   Recent Labs: 09/30/2014: ALT 14; TSH 3.08 02/18/2015: Hemoglobin 15.9; Platelets 172.0 02/26/2015: BUN 24*; Creatinine, Ser 1.78*; Potassium 3.7; Sodium 138    Lipid Panel     Component Value Date/Time   CHOL 102 09/30/2014 1032   TRIG 172.0* 09/30/2014 1032   HDL 34.20* 09/30/2014 1032   CHOLHDL 3 09/30/2014 1032   VLDL 34.4 09/30/2014 1032   LDLCALC 33 09/30/2014 1032   LDLDIRECT 45.4 07/23/2012 0939     Wt Readings from Last 3 Encounters:  06/03/15 185 lb 9.6 oz (84.188 kg)  02/26/15 195 lb 12.3 oz (88.8 kg)  02/09/15 195 lb (88.451 kg)      ASSESSMENT AND PLAN:    1. Sinus node dysfunction Normal pacemaker function See Pace Art report No changes today Doing well since recent atrial lead revision  2. afib He is on ASA and Plavix for afib.  He is unsteady at times and does not feel that he would like to consider anticoagulation AF is well controlled (no afib in past 3 months by PPM interrogation) Reduce amiodarone to  daily.  Check lfts/tfs today If af burden increases will consider LA appendage closure   3. CAD No ischemic symptoms He has a taxus stent placed for ACS.  Should probably continue plavix if able.  May stop ASA if bruising worsens.  4. HL Check fasting lipids/ LFTs  carelink every 3 months Follow-up with Lawson Fiscal in 6 months I will see in a year  Current medicines are  reviewed at length with the patient today.   The patient does not have concerns regarding his medicines.  The following changes were made today:  none  Labs/ tests ordered today include:  No orders of the defined types were placed in this encounter.    Randolm Idol, MD  06/03/2015 10:40 AM     [facility] [address] [place] [address] [phone] (office) [phone] (fax)

## 2015-06-04 LAB — CUP PACEART INCLINIC DEVICE CHECK
Battery Voltage: 2.78 V
Brady Statistic AP VS Percent: 31 %
Brady Statistic AS VP Percent: 0 %
Brady Statistic AS VS Percent: 0 %
Lead Channel Impedance Value: 369 Ohm
Lead Channel Impedance Value: 541 Ohm
Lead Channel Pacing Threshold Amplitude: 0.5 V
Lead Channel Pacing Threshold Amplitude: 0.75 V
Lead Channel Pacing Threshold Pulse Width: 0.4 ms
Lead Channel Pacing Threshold Pulse Width: 0.4 ms
Lead Channel Sensing Intrinsic Amplitude: 4 mV
Lead Channel Setting Sensing Sensitivity: 2 mV
MDC IDC MSMT BATTERY IMPEDANCE: 208 Ohm
MDC IDC MSMT BATTERY REMAINING LONGEVITY: 90 mo
MDC IDC MSMT LEADCHNL RA SENSING INTR AMPL: 4 mV
MDC IDC SESS DTM: 20160928143118
MDC IDC SET LEADCHNL RA PACING AMPLITUDE: 2 V
MDC IDC SET LEADCHNL RV PACING AMPLITUDE: 2.5 V
MDC IDC SET LEADCHNL RV PACING PULSEWIDTH: 0.4 ms
MDC IDC STAT BRADY AP VP PERCENT: 69 %

## 2015-06-10 ENCOUNTER — Other Ambulatory Visit (INDEPENDENT_AMBULATORY_CARE_PROVIDER_SITE_OTHER): Payer: Medicare HMO | Admitting: *Deleted

## 2015-06-10 DIAGNOSIS — I10 Essential (primary) hypertension: Secondary | ICD-10-CM | POA: Diagnosis not present

## 2015-06-10 LAB — T4, FREE

## 2015-06-10 LAB — LIPID PANEL
Cholesterol: 90 mg/dL — ABNORMAL LOW (ref 125–200)
HDL: 28 mg/dL — ABNORMAL LOW (ref >=40)
LDL CALC: 42 mg/dL (ref <130)
Total CHOL/HDL Ratio: 3.2 Ratio (ref <=5.0)
Triglycerides: 101 mg/dL (ref <150)
VLDL: 20 mg/dL (ref <30)

## 2015-06-10 LAB — HEPATIC FUNCTION PANEL
ALK PHOS: 54 U/L (ref 40–115)
ALT: 13 U/L (ref 9–46)
AST: 23 U/L (ref 10–35)
Albumin: 3.9 g/dL (ref 3.6–5.1)
BILIRUBIN DIRECT: 0.2 mg/dL (ref <=0.2)
Indirect Bilirubin: 0.5 mg/dL (ref 0.2–1.2)
Total Bilirubin: 0.7 mg/dL (ref 0.2–1.2)
Total Protein: 6.5 g/dL (ref 6.1–8.1)

## 2015-06-19 DIAGNOSIS — H52223 Regular astigmatism, bilateral: Secondary | ICD-10-CM | POA: Diagnosis not present

## 2015-06-19 DIAGNOSIS — Z0100 Encounter for examination of eyes and vision without abnormal findings: Secondary | ICD-10-CM | POA: Diagnosis not present

## 2015-06-29 DIAGNOSIS — Z0100 Encounter for examination of eyes and vision without abnormal findings: Secondary | ICD-10-CM | POA: Diagnosis not present

## 2015-07-15 DIAGNOSIS — Z23 Encounter for immunization: Secondary | ICD-10-CM | POA: Diagnosis not present

## 2015-07-22 ENCOUNTER — Encounter: Payer: Self-pay | Admitting: Internal Medicine

## 2015-07-22 ENCOUNTER — Ambulatory Visit (INDEPENDENT_AMBULATORY_CARE_PROVIDER_SITE_OTHER): Payer: Medicare HMO | Admitting: Internal Medicine

## 2015-07-22 ENCOUNTER — Other Ambulatory Visit (INDEPENDENT_AMBULATORY_CARE_PROVIDER_SITE_OTHER): Payer: Medicare HMO

## 2015-07-22 VITALS — BP 112/74 | HR 85 | Temp 97.7°F | Ht 67.0 in | Wt 190.0 lb

## 2015-07-22 DIAGNOSIS — E119 Type 2 diabetes mellitus without complications: Secondary | ICD-10-CM

## 2015-07-22 DIAGNOSIS — Z0000 Encounter for general adult medical examination without abnormal findings: Secondary | ICD-10-CM | POA: Diagnosis not present

## 2015-07-22 DIAGNOSIS — I10 Essential (primary) hypertension: Secondary | ICD-10-CM | POA: Diagnosis not present

## 2015-07-22 LAB — URINALYSIS, ROUTINE W REFLEX MICROSCOPIC
Bilirubin Urine: NEGATIVE
Hgb urine dipstick: NEGATIVE
Ketones, ur: NEGATIVE
Leukocytes, UA: NEGATIVE
NITRITE: NEGATIVE
Total Protein, Urine: NEGATIVE
Urine Glucose: NEGATIVE
Urobilinogen, UA: 1 (ref 0.0–1.0)
pH: 5.5 (ref 5.0–8.0)

## 2015-07-22 LAB — BASIC METABOLIC PANEL
BUN: 28 mg/dL — ABNORMAL HIGH (ref 6–23)
CHLORIDE: 103 meq/L (ref 96–112)
CO2: 29 meq/L (ref 19–32)
Calcium: 9.2 mg/dL (ref 8.4–10.5)
Creatinine, Ser: 1.93 mg/dL — ABNORMAL HIGH (ref 0.40–1.50)
GFR: 35.39 mL/min — ABNORMAL LOW (ref >60.00)
Glucose, Bld: 130 mg/dL — ABNORMAL HIGH (ref 70–99)
Potassium: 4.4 mEq/L (ref 3.5–5.1)
Sodium: 139 mEq/L (ref 135–145)

## 2015-07-22 LAB — CBC WITH DIFFERENTIAL/PLATELET
BASOS ABS: 0 10*3/uL (ref 0.0–0.1)
BASOS PCT: 0.7 % (ref 0.0–3.0)
Eosinophils Absolute: 0.4 10*3/uL (ref 0.0–0.7)
Eosinophils Relative: 7.4 % — ABNORMAL HIGH (ref 0.0–5.0)
HCT: 44.5 % (ref 39.0–52.0)
Hemoglobin: 14.7 g/dL (ref 13.0–17.0)
LYMPHS ABS: 1.3 10*3/uL (ref 0.7–4.0)
Lymphocytes Relative: 23.2 % (ref 12.0–46.0)
MCHC: 33 g/dL (ref 30.0–36.0)
MCV: 96.8 fl (ref 78.0–100.0)
Monocytes Absolute: 0.6 10*3/uL (ref 0.1–1.0)
Monocytes Relative: 10.2 % (ref 3.0–12.0)
NEUTROS PCT: 58.5 % (ref 43.0–77.0)
Neutro Abs: 3.2 10*3/uL (ref 1.4–7.7)
PLATELETS: 157 10*3/uL (ref 150.0–400.0)
RBC: 4.59 Mil/uL (ref 4.22–5.81)
RDW: 13.4 % (ref 11.5–15.5)
WBC: 5.5 10*3/uL (ref 4.0–10.5)

## 2015-07-22 LAB — HEPATIC FUNCTION PANEL
ALT: 13 U/L (ref 0–53)
AST: 20 U/L (ref 0–37)
Albumin: 3.8 g/dL (ref 3.5–5.2)
Alkaline Phosphatase: 60 U/L (ref 39–117)
BILIRUBIN DIRECT: 0.2 mg/dL (ref 0.0–0.3)
TOTAL PROTEIN: 6.8 g/dL (ref 6.0–8.3)
Total Bilirubin: 0.6 mg/dL (ref 0.2–1.2)

## 2015-07-22 LAB — LIPID PANEL
CHOLESTEROL: 99 mg/dL (ref 0–200)
HDL: 34 mg/dL — ABNORMAL LOW (ref >39.00)
LDL Cholesterol: 38 mg/dL (ref 0–99)
NONHDL: 64.73
TRIGLYCERIDES: 132 mg/dL (ref 0.0–149.0)
Total CHOL/HDL Ratio: 3
VLDL: 26.4 mg/dL (ref 0.0–40.0)

## 2015-07-22 LAB — MICROALBUMIN / CREATININE URINE RATIO
Creatinine,U: 161.4 mg/dL
Microalb Creat Ratio: 1.1 mg/g (ref 0.0–30.0)
Microalb, Ur: 1.7 mg/dL (ref 0.0–1.9)

## 2015-07-22 LAB — HEMOGLOBIN A1C: Hgb A1c MFr Bld: 6.3 % (ref 4.6–6.5)

## 2015-07-22 LAB — TSH: TSH: 3.21 u[IU]/mL (ref 0.35–4.50)

## 2015-07-22 NOTE — Patient Instructions (Signed)

## 2015-07-22 NOTE — Progress Notes (Signed)
Subjective:    Patient ID: Kent Nolan, male    DOB: Aug 23, 1929, 81 y.o.   MRN: [id number]  HPI  Here for wellness and f/u;  Overall doing ok;  Pt denies Chest pain, worsening SOB, DOE, wheezing, orthopnea, PND, worsening LE edema, palpitations, dizziness or syncope. Dizziness much improved with recent lead replacement for PPM.  Pt denies neurological change such as new headache, facial or extremity weakness.  Pt denies polydipsia, polyuria, or low sugar symptoms. Pt states overall good compliance with treatment and medications, good tolerability, and has been trying to follow appropriate diet.  Pt denies worsening depressive symptoms, suicidal ideation or panic. No fever, night sweats, wt loss, loss of appetite, or other constitutional symptoms.  Pt states good ability with ADL's, has low fall risk, home safety reviewed and adequate, no other significant changes in hearing or vision, and only occasionally active with exercise.Pt continues to have recurring LBP without change in severity, bowel or bladder change, fever, wt loss,  worsening LE pain/numbness/weakness, gait change or falls. Walks with cane Past Medical History  Diagnosis Date  . Coronary artery disease     remote subendo MI in February 2005 with stents to RCA &LCX; s/p scute thrombosis of both stents in February 2010 with cardiogenic shock - s/p BMS to both lesions; follow up cath in September 2010 was satisfactory.  He is medically managed with aspirin/Plavix  . PAF (paroxysmal atrial fibrillation) (HCC)     s/p cardioversion in August 2011  . GERD (gastroesophageal reflux disease)   . Hyperlipidemia   . Chronic kidney disease     followed by nephrology  . Pacemaker   . HTN (hypertension)   . Obesity   . High risk medication use     on amiodarone for PAF/VT since February 2010  . BRADYCARDIA 08/04/2010    s/p PPM since 2011 - generator change 12/03/13  . Type II or unspecified type diabetes mellitus without mention of  complication, uncontrolled    Past Surgical History  Procedure Laterality Date  . Pacemaker insertion  May 2011  . Pacemaker generator change  12/03/13    MDT Adapta L generator change for premature ERI by [name]  . Permanent pacemaker generator change N/A 12/03/2013    Procedure: PERMANENT PACEMAKER GENERATOR CHANGE;  Surgeon: Gardiner RhymeJames D Allred, MD;  Location: [facility];  Service: Cardiovascular;  Laterality: N/A;  . Ep implantable device N/A 02/25/2015    Procedure: Lead Revision/Repair;  Surgeon: Hillis RangeJames Allred, MD;  Location: [facility];  Service: Cardiovascular;  Laterality: N/A;    reports that he has never smoked. He does not have any smokeless tobacco history on file. He reports that he does not drink alcohol or use illicit drugs. family history includes Diabetes in his father; Heart disease in his mother; Stomach cancer in his father. Allergies  Allergen Reactions  . Penicillins Other (See Comments)    Reaction unknown  . Sulfonamide Derivatives Other (See Comments)    Reaction unknown   Current Outpatient Prescriptions on File Prior to Visit  Medication Sig Dispense Refill  . acetaminophen (TYLENOL) 500 MG tablet Take 500 mg by mouth every 6 (six) hours as needed. For pain     . amiodarone (PACERONE) 200 MG tablet Take 0.5 tablets (100 mg total) by mouth daily. 30 tablet 10  . aspirin 81 MG tablet Take 81 mg by mouth daily.      . Cholecalciferol (VITAMIN D-3 PO) Take 1 tablet by mouth  daily.    . clopidogrel (PLAVIX) 75 MG tablet Take 1 tablet (75 mg total) by mouth daily. Restart Plavix on 03/02/15 30 tablet 10  . famotidine (PEPCID) 40 MG tablet TAKE 1 TABLET BY MOUTH DAILY 30 tablet 11  . fenofibrate (TRICOR) 145 MG tablet TAKE 1 TABLET BY MOUTH DAILY 30 tablet 10  . Multiple Vitamin (MULTIVITAMIN) tablet Take 1 tablet by mouth daily.      . nitroGLYCERIN (NITROSTAT) 0.4 MG SL tablet Place 1 tablet (0.4 mg total) under the tongue every 5 (five) minutes as needed. For  chest pain 25 tablet 5  . rosuvastatin (CRESTOR) 20 MG tablet TAKE 1 TABLET BY MOUTH DAILY 90 tablet 1   No current facility-administered medications on file prior to visit.   Review of Systems  Constitutional: Negative for unusual diaphoresis or night sweats HENT: Negative for ringing in ear or discharge Eyes: Negative for double vision or worsening visual disturbance.  Respiratory: Negative for choking and stridor.   Gastrointestinal: Negative for vomiting or other signifcant bowel change Genitourinary: Negative for hematuria or change in urine volume.  Musculoskeletal: Negative for other MSK pain or swelling Skin: Negative for color change and worsening wound.  Neurological: Negative for tremors and numbness other than noted  Psychiatric/Behavioral: Negative for decreased concentration or agitation other than above       Objective:   Physical Exam BP 112/74 mmHg  Pulse 85  Temp(Src) 97.7 F (36.5 C) (Oral)  Ht  (1.702 m)  Wt 190 lb (86.183 kg)  BMI 29.75 kg/m2  SpO2 97% VS noted, not ill appearing Constitutional: Pt appears in no significant distress HENT: Head: NCAT.  Right Ear: External ear normal.  Left Ear: External ear normal.  Eyes: . Pupils are equal, round, and reactive to light. Conjunctivae and EOM are normal Neck: Normal range of motion. Neck supple.  Cardiovascular: Normal rate and regular rhythm.   Pulmonary/Chest: Effort normal and breath sounds without rales or wheezing.  Abd:  Soft, NT, ND, + BS Neurological: Pt is alert. Not confused , motor grossly intact Skin: Skin is warm. No rash, no LE edema Psychiatric: Pt behavior is normal. No agitation.     Assessment & Plan:

## 2015-07-22 NOTE — Assessment & Plan Note (Signed)
stable overall by history and exam, recent data reviewed with pt, and pt to continue medical treatment as before,  to f/u any worsening symptoms or concerns Lab Results  Component Value Date   HGBA1C 6.9* 11/26/2013

## 2015-07-22 NOTE — Progress Notes (Signed)
Pre visit review using our clinic review tool, if applicable. No additional management support is needed unless otherwise documented below in the visit note. 

## 2015-07-22 NOTE — Assessment & Plan Note (Signed)

## 2015-07-22 NOTE — Assessment & Plan Note (Signed)
stable overall by history and exam, recent data reviewed with pt, and pt to continue medical treatment as before,  to f/u any worsening symptoms or concerns BP Readings from Last 3 Encounters:  07/22/15 112/74  06/03/15 122/84  02/26/15 150/70

## 2015-08-03 ENCOUNTER — Other Ambulatory Visit: Payer: Self-pay | Admitting: Internal Medicine

## 2015-09-03 ENCOUNTER — Ambulatory Visit (INDEPENDENT_AMBULATORY_CARE_PROVIDER_SITE_OTHER): Payer: Medicare HMO | Admitting: *Deleted

## 2015-09-03 ENCOUNTER — Telehealth: Payer: Self-pay | Admitting: Cardiology

## 2015-09-03 DIAGNOSIS — I495 Sick sinus syndrome: Secondary | ICD-10-CM

## 2015-09-03 LAB — CUP PACEART REMOTE DEVICE CHECK
Brady Statistic AP VP Percent: 73.7 %
Brady Statistic AS VP Percent: 3.6 %
Brady Statistic AS VS Percent: 1.6 %
Date Time Interrogation Session: 20170117092631
Implantable Lead Location: 753860
Implantable Lead Model: 4470
Implantable Lead Model: 5076
Lead Channel Impedance Value: 381 Ohm
Lead Channel Pacing Threshold Amplitude: 0.875 V
Lead Channel Pacing Threshold Pulse Width: 0.4 ms
Lead Channel Pacing Threshold Pulse Width: 0.4 ms
Lead Channel Sensing Intrinsic Amplitude: 5.6 mV
Lead Channel Setting Pacing Amplitude: 2 V
Lead Channel Setting Pacing Pulse Width: 0.4 ms
Lead Channel Setting Sensing Sensitivity: 2 mV
MDC IDC LEAD IMPLANT DT: 20110531
MDC IDC LEAD IMPLANT DT: 20160622
MDC IDC LEAD LOCATION: 753859
MDC IDC LEAD SERIAL: 677341
MDC IDC MSMT LEADCHNL RA IMPEDANCE VALUE: 531 Ohm
MDC IDC MSMT LEADCHNL RA PACING THRESHOLD AMPLITUDE: 0.625 V
MDC IDC SET LEADCHNL RV PACING AMPLITUDE: 2.5 V
MDC IDC STAT BRADY AP VS PERCENT: 21.2 %

## 2015-09-03 NOTE — Telephone Encounter (Signed)
LMOVM reminding pt to send remote transmission.   

## 2015-09-03 NOTE — Progress Notes (Signed)
Remote pacemaker transmission.   

## 2015-09-18 ENCOUNTER — Other Ambulatory Visit: Payer: Self-pay | Admitting: Internal Medicine

## 2015-09-23 ENCOUNTER — Encounter: Payer: Self-pay | Admitting: Cardiology

## 2015-10-01 ENCOUNTER — Ambulatory Visit (INDEPENDENT_AMBULATORY_CARE_PROVIDER_SITE_OTHER)
Admission: RE | Admit: 2015-10-01 | Discharge: 2015-10-01 | Disposition: A | Discharge status: Home / Self Care | Payer: Medicare HMO | Source: Ambulatory Visit | Attending: Family Medicine | Admitting: Family Medicine

## 2015-10-01 ENCOUNTER — Ambulatory Visit (INDEPENDENT_AMBULATORY_CARE_PROVIDER_SITE_OTHER): Payer: Medicare HMO | Admitting: Family Medicine

## 2015-10-01 ENCOUNTER — Encounter: Payer: Self-pay | Admitting: Family Medicine

## 2015-10-01 VITALS — BP 120/80 | HR 96

## 2015-10-01 DIAGNOSIS — M47816 Spondylosis without myelopathy or radiculopathy, lumbar region: Secondary | ICD-10-CM | POA: Diagnosis not present

## 2015-10-01 DIAGNOSIS — M5442 Lumbago with sciatica, left side: Secondary | ICD-10-CM

## 2015-10-01 NOTE — Patient Instructions (Signed)
Good to see you  Xray downstairs today  You are likely going to have a severe amount of arthritis.  Tylenol  3 times daily Vitamin D 2000 IU daily Capsaicin topically up to four times a day may also help with pain.  It's important that you continue to stay active.  We can consider  physical therapy to strengthen muscles around the joint that hurts to take pressure off of the joint itself. Shoe inserts with good arch support may be helpful.  [employer] at [employer] sports could help.  Water aerobics and cycling with low resistance are the best two types of exercise for arthritis. Come back and see me in 4 weeks. Or call if worsening numbness in the legs or weakness occurs.

## 2015-10-01 NOTE — Progress Notes (Signed)
[facility] [address] [place], [place] [address] Phone: [phone] Subjective:    I'm seeing this patient by the request  of:  [name], MD   CC: Back pain  [id number] Kent Nolan is a 82 y.o. male coming in with complaint of back pain. Patient has had this for many years. Seems to be worsening. Describes it as a chronic dull throbbing aching sensation of the back. Rates the severity of 6 out of 10. Seems to be worse now with activity. Worse with laying down flat. States that there is some radiation going down the legs. Some mild weakness of the leg. Seems to be intermittent and gets better when he sits down. States that it is affecting his daily activities somewhat. Trying to stay active but finding it difficult. Has been seen by another provider previously and was sent to formal physical therapy that he states was not helpful. States that Tylenol over-the-counter has also not been helpful and cannot do anti-inflammatories secondary to him being on chronic blood thinners.     Past Medical History  Diagnosis Date  . Coronary artery disease     remote subendo MI in February 2005 with stents to RCA &LCX; s/p scute thrombosis of both stents in February 2010 with cardiogenic shock - s/p BMS to both lesions; follow up cath in September 2010 was satisfactory.  He is medically managed with aspirin/Plavix  . PAF (paroxysmal atrial fibrillation) (HCC)     s/p cardioversion in August 2011  . GERD (gastroesophageal reflux disease)   . Hyperlipidemia   . Chronic kidney disease     followed by nephrology  . Pacemaker   . HTN (hypertension)   . Obesity   . High risk medication use     on amiodarone for PAF/VT since February 2010  . BRADYCARDIA 08/04/2010    s/p PPM since 2011 - generator change 12/03/13  . Type II or unspecified type diabetes mellitus without mention of complication, uncontrolled    Past Surgical History  Procedure Laterality Date  .  Pacemaker insertion  May 2011  . Pacemaker generator change  12/03/13    MDT Adapta L generator change for premature ERI by Dr Johney Frame  . Permanent pacemaker generator change N/A 12/03/2013    Procedure: PERMANENT PACEMAKER GENERATOR CHANGE;  Surgeon: [name], MD;  Location: [facility];  Service: Cardiovascular;  Laterality: N/A;  . Ep implantable device N/A 02/25/2015    Procedure: Lead Revision/Repair;  Surgeon: [name], MD;  Location: [facility];  Service: Cardiovascular;  Laterality: N/A;   Social History   Social History  . Marital Status: Married    Spouse Name: N/A  . Number of Children: N/A  . Years of Education: N/A   Social History Main Topics  . Smoking status: Never Smoker   . Smokeless tobacco: None  . Alcohol Use: No  . Drug Use: No  . Sexual Activity: No   Other Topics Concern  . None   Social History Narrative   Allergies  Allergen Reactions  . Penicillins Other (See Comments)    Reaction unknown  . Sulfonamide Derivatives Other (See Comments)    Reaction unknown   Family History  Problem Relation Age of Onset  . Heart disease Mother   . Stomach cancer Father   . Diabetes Father     Past medical history, social, surgical and family history all reviewed in electronic medical record.  No pertanent information  unless stated regarding to the chief complaint.   Review of Systems: No headache, visual changes, nausea, vomiting, diarrhea, constipation, dizziness, abdominal pain, skin rash, fevers, chills, night sweats, weight loss, swollen lymph nodes, body aches, joint swelling, muscle aches, chest pain, shortness of breath, mood changes.   Objective Blood pressure 120/80, pulse 96.  General: No apparent distress alert and oriented x3 mood and affect normal, dressed appropriately. Mild masked facies HEENT: Pupils equal, extraocular movements intact  Respiratory: Patient's speak in full sentences and does not appear short of breath    Cardiovascular: No lower extremity edema, non tender, no erythema  Skin: Warm dry intact with no signs of infection or rash on extremities or on axial skeleton. Patient does have significant bruising of the hands and forearms from different injuries Abdomen: Soft nontender  Neuro: Cranial nerves II through XII are intact, neurovascularly intact in all extremities with 2+ DTRs and 2+ pulses.  Lymph: No lymphadenopathy of posterior or anterior cervical chain or axillae bilaterally.  Gait walks with the aid of a cane and mild shuffling gait MSK:  Non tender with full range of motion and good stability and symmetric strength and tone of shoulders, elbows, wrist, hip, knee and ankles bilaterally. Does have a resting tremor noted Back Exam:  Inspection: Patient does have scoliosis of the lumbar spine Motion: Flexion 25 deg, Extension 15 deg, Side Bending to 25 deg bilaterally,  Rotation to 25 deg bilaterally  SLR laying: Negative . Mild rigidity noted XSLR laying: Negative  Palpable tenderness: Tender to palpation in the [facility] musculature bilaterally of the lumbar spine FABER: Unable to do secondary to stiffness Sensory change: Gross sensation intact to all lumbar and sacral dermatomes.  Reflexes: 2+ at both patellar tendons, 2+ at achilles tendons, Babinski's downgoing.  Strength at foot  Plantar-flexion: 5/5 Dorsi-flexion: 5/5 Eversion: 5/5 Inversion: 5/5  Leg strength  Quad: 4/5 but symmetric Hamstring: 4/5 but symmetric Hip flexor: 5/5 Hip abductors: 3/5       Impression and Recommendations:     This case required medical decision making of moderate complexity.      Note: This dictation was prepared with Dragon dictation along with smaller phrase technology. Any transcriptional errors that result from this process are unintentional.

## 2015-10-01 NOTE — Assessment & Plan Note (Signed)
Patient is having quite severe in nature. Patient does have what appears to be degenerative scoliosis noted on exam today. No positive straight leg test. I do believe though that patient's history is consistent with spinal stenosis. Patient also could have some vascular compromise with his diabetes but I do not think that this is as likely. Patient also has some signs and symptoms that could correspond with Parkinson . Discussed with patient and wife about with his other medical problems I would like to not be too aggressive. We will get x-rays to make sure there is no other bony normality that can be contributing. I am expecting severe arthritis. We discussed over-the-counter natural medications and stent will be safe and effective with the medications that he is on in the proper dosing. We discussed some very mild range of motion exercises he could potentially try to do in bed. I would like to avoid any significant aggressive therapy but he could respond well to aquatic therapy or formal physical therapy in the long run. Patient declined that at this moment. Patient and will come back and see me know again in 4 weeks for further evaluation and treatment.

## 2015-10-21 ENCOUNTER — Telehealth: Payer: Self-pay | Admitting: Internal Medicine

## 2015-10-21 NOTE — Telephone Encounter (Signed)
Please call pt regarding the scan he had done in January

## 2015-10-21 NOTE — Telephone Encounter (Signed)
Discussed x-ray results with pt's wife.

## 2015-10-29 ENCOUNTER — Ambulatory Visit: Payer: Medicare HMO | Admitting: Family Medicine

## 2015-11-24 DIAGNOSIS — N189 Chronic kidney disease, unspecified: Secondary | ICD-10-CM | POA: Diagnosis not present

## 2015-11-24 DIAGNOSIS — N2581 Secondary hyperparathyroidism of renal origin: Secondary | ICD-10-CM | POA: Diagnosis not present

## 2015-11-24 DIAGNOSIS — N184 Chronic kidney disease, stage 4 (severe): Secondary | ICD-10-CM | POA: Diagnosis not present

## 2015-11-30 DIAGNOSIS — N2581 Secondary hyperparathyroidism of renal origin: Secondary | ICD-10-CM | POA: Diagnosis not present

## 2015-11-30 DIAGNOSIS — D631 Anemia in chronic kidney disease: Secondary | ICD-10-CM | POA: Diagnosis not present

## 2015-11-30 DIAGNOSIS — I129 Hypertensive chronic kidney disease with stage 1 through stage 4 chronic kidney disease, or unspecified chronic kidney disease: Secondary | ICD-10-CM | POA: Diagnosis not present

## 2015-11-30 DIAGNOSIS — N184 Chronic kidney disease, stage 4 (severe): Secondary | ICD-10-CM | POA: Diagnosis not present

## 2015-12-02 ENCOUNTER — Encounter: Payer: Self-pay | Admitting: Nurse Practitioner

## 2015-12-02 ENCOUNTER — Ambulatory Visit (INDEPENDENT_AMBULATORY_CARE_PROVIDER_SITE_OTHER): Payer: Medicare HMO | Admitting: Nurse Practitioner

## 2015-12-02 VITALS — BP 140/90 | HR 64 | Ht 67.0 in | Wt 188.4 lb

## 2015-12-02 DIAGNOSIS — I10 Essential (primary) hypertension: Secondary | ICD-10-CM | POA: Diagnosis not present

## 2015-12-02 DIAGNOSIS — I480 Paroxysmal atrial fibrillation: Secondary | ICD-10-CM | POA: Diagnosis not present

## 2015-12-02 DIAGNOSIS — I495 Sick sinus syndrome: Secondary | ICD-10-CM | POA: Diagnosis not present

## 2015-12-02 DIAGNOSIS — E785 Hyperlipidemia, unspecified: Secondary | ICD-10-CM

## 2015-12-02 DIAGNOSIS — Z79899 Other long term (current) drug therapy: Secondary | ICD-10-CM | POA: Diagnosis not present

## 2015-12-02 DIAGNOSIS — I4891 Unspecified atrial fibrillation: Secondary | ICD-10-CM | POA: Diagnosis not present

## 2015-12-02 DIAGNOSIS — I2510 Atherosclerotic heart disease of native coronary artery without angina pectoris: Secondary | ICD-10-CM

## 2015-12-02 LAB — HEPATIC FUNCTION PANEL
ALT: 13 U/L (ref 9–46)
AST: 19 U/L (ref 10–35)
Albumin: 4 g/dL (ref 3.6–5.1)
Alkaline Phosphatase: 71 U/L (ref 40–115)
Bilirubin, Direct: 0.2 mg/dL (ref <=0.2)
Indirect Bilirubin: 0.5 mg/dL (ref 0.2–1.2)
Total Bilirubin: 0.7 mg/dL (ref 0.2–1.2)
Total Protein: 6.8 g/dL (ref 6.1–8.1)

## 2015-12-02 LAB — BASIC METABOLIC PANEL
BUN: 32 mg/dL — ABNORMAL HIGH (ref 7–25)
CO2: 25 mmol/L (ref 20–31)
Calcium: 9.2 mg/dL (ref 8.6–10.3)
Chloride: 105 mmol/L (ref 98–110)
Creat: 2.35 mg/dL — ABNORMAL HIGH (ref 0.70–1.11)
Glucose, Bld: 117 mg/dL — ABNORMAL HIGH (ref 65–99)
Potassium: 4.3 mmol/L (ref 3.5–5.3)
Sodium: 141 mmol/L (ref 135–146)

## 2015-12-02 LAB — CBC
HCT: 43 % (ref 39.0–52.0)
Hemoglobin: 14.4 g/dL (ref 13.0–17.0)
MCH: 31.9 pg (ref 26.0–34.0)
MCHC: 33.5 g/dL (ref 30.0–36.0)
MCV: 95.3 fL (ref 78.0–100.0)
MPV: 12.4 fL (ref 8.6–12.4)
Platelets: 167 10*3/uL (ref 150–400)
RBC: 4.51 MIL/uL (ref 4.22–5.81)
RDW: 13.4 % (ref 11.5–15.5)
WBC: 6.2 10*3/uL (ref 4.0–10.5)

## 2015-12-02 LAB — LIPID PANEL
Cholesterol: 96 mg/dL — ABNORMAL LOW (ref 125–200)
HDL: 31 mg/dL — ABNORMAL LOW (ref >=40)
LDL Cholesterol: 35 mg/dL (ref <130)
Total CHOL/HDL Ratio: 3.1 Ratio (ref <=5.0)
Triglycerides: 148 mg/dL (ref <150)
VLDL: 30 mg/dL (ref <30)

## 2015-12-02 LAB — TSH: TSH: 2.68 mIU/L (ref 0.40–4.50)

## 2015-12-02 NOTE — Patient Instructions (Addendum)
We will be checking the following labs today -  HPF, Lipids, and TSH   Medication Instructions:    Continue with your current medicines.     Testing/Procedures To Be Arranged:  N/A  Follow-Up:   See Dr. Johney FrameAllred in October.   Do your remote check as planned tomorrow.     Other Special Instructions:   Let us know if you have any more passing out spells.     If you need a refill on your cardiac medications before your next appointment, please call your pharmacy.   Call the [facility] office at [phone] if you have any questions, problems or concerns.

## 2015-12-02 NOTE — Progress Notes (Signed)
CARDIOLOGY OFFICE NOTE  Date:  12/02/2015    Kent Nolan Date of Birth: 27-Oct-1928 Medical Record #[id number]  PCP:  [name], MD  Cardiologist:  Allred    Chief Complaint  Patient presents with  . Atrial Fibrillation  . Coronary Artery Disease  . Hypertension  . Hyperlipidemia    Follow up visit - seen for [name]    History of Present Illness: Kent Nolan is a 82 y.o. male who presents today for a follow up visit. Seen for [name]. Former patient of [name].   Kent Nolan has known ischemic heart disease with prior subendocardial MI in February of 2005 with stents to the RCA and LCX. Kent Nolan experienced acute thrombosis of both stents in February of 2010 that was associated with cardiogenic shock and had repeat stenting to both lesions at that time with non drug eluting stents placed. Follow up cath in September of 2010 was satisfactory. Kent Nolan is on chronic Plavix.   Other problems include DM, HTN, GERD, ACE related cough, HLD, PAF and VT back in 2010 and on chronic amiodarone therapy, CRI and remote UTIs. Kent Nolan does have an underlying pacemaker in place since 2011 and has had prior cardioversion for his atrial fib back in August of 2011. While Kent Nolan does have an elevated CHADS score, Kent Nolan has been maintained on chronic Plavix and aspirin therapy in light of his stent thrombosis - Kent Nolan has declined anticoagulation with coumadin/NOVAC - has had no recurrent atrial fib. EF by echo from 2010 was 50 to 55%. Last generator change was in March of 2015.   I saw him back in February of 2015 - Kent Nolan was dizzy. Had "fallen out" several times since Kent Nolan was here - no significant injury yet. We did extensive testing at that time which included Myoview, echo, CT etc. Felt to have some degree of orthostasis. Has also had his generator replaced at the end of March 2015 per [name].   Seen by me in July. Still dizzy. Pretty limited from back pain - had been to see his PCP and was given Vicodin,  muscle relaxer and prednisone. Then saw [name] back in July as well. Device check ok. Kent Nolan was quite orthostatic at his visit here - almost passed out while weighing. Was using narcotics and felt to be sedated as well. Lasix was stopped. Does not wish to change to coumadin/NOAC but prefers to stay on his Plavix and aspirin therapy.  I have not seen him since January of last year - Kent Nolan was doing ok - mostly limited by his back. Has seen [name] several times last year - last in September. Had to have his atrial lead revised last summer due to failure. His dose of amiodarone was cut back to 100 mg. AF burden was low. Continued to refuse other anticoagulation but discussion of possible Watchman.  Comes in today. Here alone today. Wife is at home - they are getting a new A/C unit installed which is why she is not here. Mostly limited by his back. Was given some Ultram by [facility] - Kent Nolan has not tried yet. Wanted to know if it was ok to take.  Has a hard time stooping over. Gets short of breath with much activity. More sedentary. Using a cane. Has a walker as well.  Kent Nolan notes Kent Nolan fell back in November - says Kent Nolan "passed out" - hit his head - did not seek attention. Says Kent Nolan was "ok". Has not  had recurrence. For remote pacer check tomorrow.   Past Medical History  Diagnosis Date  . Coronary artery disease     remote subendo MI in February 2005 with stents to RCA &LCX; s/p scute thrombosis of both stents in February 2010 with cardiogenic shock - s/p BMS to both lesions; follow up cath in September 2010 was satisfactory.  Kent Nolan is medically managed with aspirin/Plavix  . PAF (paroxysmal atrial fibrillation) (HCC)     s/p cardioversion in August 2011  . GERD (gastroesophageal reflux disease)   . Hyperlipidemia   . Chronic kidney disease     followed by nephrology  . Pacemaker   . HTN (hypertension)   . Obesity   . High risk medication use     on amiodarone for PAF/VT since February 2010  . BRADYCARDIA  08/04/2010    s/p PPM since 2011 - generator change 12/03/13  . Type II or unspecified type diabetes mellitus without mention of complication, uncontrolled     Past Surgical History  Procedure Laterality Date  . Pacemaker insertion  May 2011  . Pacemaker generator change  12/03/13    MDT Adapta L generator change for premature ERI by Dr Johney Frame  . Permanent pacemaker generator change N/A 12/03/2013    Procedure: PERMANENT PACEMAKER GENERATOR CHANGE;  Surgeon: [name], MD;  Location: [facility];  Service: Cardiovascular;  Laterality: N/A;  . Ep implantable device N/A 02/25/2015    Procedure: Lead Revision/Repair;  Surgeon: [name], MD;  Location: [facility];  Service: Cardiovascular;  Laterality: N/A;     Medications: Current Outpatient Prescriptions  Medication Sig Dispense Refill  . acetaminophen (TYLENOL) 500 MG tablet Take 500 mg by mouth every 6 (six) hours as needed. For pain     . amiodarone (PACERONE) 200 MG tablet Take 0.5 tablets (100 mg total) by mouth daily. 30 tablet 10  . aspirin 81 MG tablet Take 81 mg by mouth daily.      . Cholecalciferol (VITAMIN D-3 PO) Take 1 tablet by mouth daily.    . clopidogrel (PLAVIX) 75 MG tablet Take 1 tablet (75 mg total) by mouth daily. Restart Plavix on 03/02/15 30 tablet 10  . famotidine (PEPCID) 40 MG tablet TAKE 1 TABLET BY MOUTH DAILY 30 tablet 11  . fenofibrate (TRICOR) 145 MG tablet TAKE 1 TABLET BY MOUTH EVERY DAY 30 tablet 10  . Multiple Vitamin (MULTIVITAMIN) tablet Take 1 tablet by mouth daily.      . nitroGLYCERIN (NITROSTAT) 0.4 MG SL tablet Place 1 tablet (0.4 mg total) under the tongue every 5 (five) minutes as needed. For chest pain 25 tablet 5  . rosuvastatin (CRESTOR) 20 MG tablet TAKE 1 TABLET BY MOUTH EVERY DAY 90 tablet 3   No current facility-administered medications for this visit.    Allergies: Allergies  Allergen Reactions  . Penicillins Other (See Comments)    Reaction unknown  . Sulfonamide  Derivatives Other (See Comments)    Reaction unknown    Social History: The patient  reports that Kent Nolan has never smoked. Kent Nolan does not have any smokeless tobacco history on file. Kent Nolan reports that Kent Nolan does not drink alcohol or use illicit drugs.   Family History: The patient's family history includes Diabetes in his father; Heart disease in his mother; Stomach cancer in his father.   Review of Systems: Please see the history of present illness.   Otherwise, the review of systems is positive for none.   All other systems  are reviewed and negative.   Physical Exam: VS:  BP 140/90 mmHg  Pulse 64  Ht 5\' 7"  (1.702 m)  Wt 188 lb 6.4 oz (85.458 kg)  BMI 29.50 kg/m2 .  BMI Body mass index is 29.5 kg/(m^2).  Wt Readings from Last 3 Encounters:  12/02/15 188 lb 6.4 oz (85.458 kg)  07/22/15 190 lb (86.183 kg)  06/03/15 185 lb 9.6 oz (84.188 kg)    General: Pleasant. Looks a little more frail. Kent Nolan is alert and in no acute distress. Using a cane.  HEENT: Normal. Neck: Supple, no JVD, carotid bruits, or masses noted.  Cardiac: Regular rate and rhythm. No murmurs, rubs, or gallops. No edema.  Respiratory:  Lungs are clear to auscultation bilaterally with normal work of breathing.  GI: Soft and nontender.  MS: No deformity or atrophy. Gait and ROM intact. Skin: Warm and dry. Color is normal.  Neuro:  Strength and sensation are intact and no gross focal deficits noted.  Psych: Alert, appropriate and with normal affect.   LABORATORY DATA:  EKG:  EKG is ordered today. This shows possible A pacing - low amplitude - reviewed with Dr. Johney FrameAllred - cannot rule out this is AF.   Lab Results  Component Value Date   WBC 5.5 07/22/2015   HGB 14.7 07/22/2015   HCT 44.5 07/22/2015   PLT 157.0 07/22/2015   GLUCOSE 130* 07/22/2015   CHOL 99 07/22/2015   TRIG 132.0 07/22/2015   HDL 34.00* 07/22/2015   LDLDIRECT 45.4 07/23/2012   LDLCALC 38 07/22/2015   ALT 13 07/22/2015   AST 20 07/22/2015   NA 139  07/22/2015   K 4.4 07/22/2015   CL 103 07/22/2015   CREATININE 1.93* 07/22/2015   BUN 28* 07/22/2015   CO2 29 07/22/2015   TSH 3.21 07/22/2015   INR 1.1* 11/26/2013   HGBA1C 6.3 07/22/2015   MICROALBUR 1.7 07/22/2015    BNP (last 3 results) No results for input(s): BNP in the last 8760 hours.  ProBNP (last 3 results) No results for input(s): PROBNP in the last 8760 hours.   Other Studies Reviewed Today:  Echo Study Conclusions from February 2015  - Left ventricle: The cavity size was normal. Systolic function was mildly reduced. The estimated ejection fraction was in the range of 45% to 50%. There was an increased relative contribution of atrial contraction to ventricular filling. Doppler parameters are consistent with abnormal left ventricular relaxation (grade 1 diastolic dysfunction). - Aortic valve: Moderate thickening and calcification, consistent with sclerosis. - Aorta: Aortic root dimension: 38mm (ED). - Ascending aorta: The ascending aorta was mildly dilated. - Mitral valve: Moderate regurgitation. Valve area by pressure half-time: 1.86cm^2.  Myoview Overall Impression from 10/2013:  Intermediate risk stress nuclear study with  large (35% of myocardium) mostly fixed inferolateral defect  suggesting of LCx territory scar. No significant reversible  ischemia.  LV Wall Motion: LVEF 47%, with inferolateral akinesis  Kent NoseKenneth C. Hilty, MD, [facility] Affairs Medical CenterFACC Board Certified in Nuclear Cardiology Attending Cardiologist [facility]   Assessment/Plan: 1. Sinus node dysfunction - pacemaker in place - prior atrial lead revision last year.  For remote check tomorrow.   2. PAF - Kent Nolan is on ASA and Plavix for his CAD.  Kent Nolan remains pretty unsteady - does not wish to take further anticoagulation. EKG reviewed with Dr. Johney FrameAllred - regardless of his rhythm - we both feel that the treatment plan is the same. Kent Nolan will do his remote check tomorrow. For now, no change in his  medicines. If AF  burden increases will consider LA appendage closure   3. CAD No ischemic symptoms reported - I have left him on his current regimen. Kent Nolan has a taxus stent in place.   4. HLD - on statin  5. High risk therapy - Kent Nolan has just had labs with [name] - trying to find out what they drew - waiting on office note.   6. Fall/?syncope last fall - I asked him to let us know if this recurred.   Current medicines are reviewed with the patient today.  The patient does not have concerns regarding medicines other than what has been noted above.  The following changes have been made:  See above.  Labs/ tests ordered today include:    Orders Placed This Encounter  Procedures  . Basic metabolic panel  . CBC  . Hepatic function panel  . TSH  . Lipid panel  . EKG 12-Lead     Disposition:   FU with me in 6 months.   Patient is agreeable to this plan and will call if any problems develop in the interim.   Signed: Rosalio Macadamia, RN, ANP-C 12/02/2015 10:36 AM  [facility] [address] [place], [place]  [address] Phone: [phone] Fax: [phone]

## 2015-12-03 ENCOUNTER — Ambulatory Visit (INDEPENDENT_AMBULATORY_CARE_PROVIDER_SITE_OTHER): Payer: Medicare HMO | Admitting: *Deleted

## 2015-12-03 DIAGNOSIS — I495 Sick sinus syndrome: Secondary | ICD-10-CM

## 2015-12-03 NOTE — Progress Notes (Signed)
Remote pacemaker transmission.   

## 2015-12-12 ENCOUNTER — Other Ambulatory Visit: Payer: Self-pay | Admitting: Internal Medicine

## 2015-12-17 LAB — CUP PACEART REMOTE DEVICE CHECK
Battery Remaining Longevity: 104 mo
Brady Statistic AP VP Percent: 56 %
Brady Statistic AS VP Percent: 7 %
Brady Statistic AS VS Percent: 5 %
Date Time Interrogation Session: 20170330121525
Implantable Lead Implant Date: 20110531
Implantable Lead Location: 753860
Lead Channel Pacing Threshold Amplitude: 0.75 V
Lead Channel Pacing Threshold Amplitude: 0.875 V
Lead Channel Pacing Threshold Pulse Width: 0.4 ms
Lead Channel Pacing Threshold Pulse Width: 0.4 ms
Lead Channel Sensing Intrinsic Amplitude: 5.6 mV
Lead Channel Setting Pacing Pulse Width: 0.4 ms
Lead Channel Setting Sensing Sensitivity: 2 mV
MDC IDC LEAD IMPLANT DT: 20160622
MDC IDC LEAD LOCATION: 753859
MDC IDC LEAD SERIAL: 677341
MDC IDC MSMT BATTERY IMPEDANCE: 232 Ohm
MDC IDC MSMT BATTERY VOLTAGE: 2.79 V
MDC IDC MSMT LEADCHNL RA IMPEDANCE VALUE: 501 Ohm
MDC IDC MSMT LEADCHNL RV IMPEDANCE VALUE: 379 Ohm
MDC IDC SET LEADCHNL RA PACING AMPLITUDE: 2 V
MDC IDC SET LEADCHNL RV PACING AMPLITUDE: 2.5 V
MDC IDC STAT BRADY AP VS PERCENT: 31 %

## 2015-12-22 ENCOUNTER — Encounter: Payer: Self-pay | Admitting: Cardiology

## 2016-01-20 ENCOUNTER — Ambulatory Visit (INDEPENDENT_AMBULATORY_CARE_PROVIDER_SITE_OTHER): Payer: Medicare HMO | Admitting: Internal Medicine

## 2016-01-20 ENCOUNTER — Encounter: Payer: Self-pay | Admitting: Internal Medicine

## 2016-01-20 VITALS — BP 130/72 | HR 93 | Temp 97.7°F | Resp 20 | Wt 184.0 lb

## 2016-01-20 DIAGNOSIS — N183 Chronic kidney disease, stage 3 unspecified: Secondary | ICD-10-CM

## 2016-01-20 DIAGNOSIS — Z0000 Encounter for general adult medical examination without abnormal findings: Secondary | ICD-10-CM | POA: Diagnosis not present

## 2016-01-20 DIAGNOSIS — Z0001 Encounter for general adult medical examination with abnormal findings: Secondary | ICD-10-CM

## 2016-01-20 DIAGNOSIS — I10 Essential (primary) hypertension: Secondary | ICD-10-CM | POA: Diagnosis not present

## 2016-01-20 DIAGNOSIS — R6889 Other general symptoms and signs: Secondary | ICD-10-CM | POA: Diagnosis not present

## 2016-01-20 DIAGNOSIS — M5442 Lumbago with sciatica, left side: Secondary | ICD-10-CM | POA: Diagnosis not present

## 2016-01-20 DIAGNOSIS — E785 Hyperlipidemia, unspecified: Secondary | ICD-10-CM

## 2016-01-20 NOTE — Patient Instructions (Signed)
Please continue all other medications as before, and refills have been done if requested.  Please have the pharmacy call with any other refills you may need.  Please continue your efforts at being more active, low cholesterol diet, and weight control.  You are otherwise up to date with prevention measures today.  Please keep your appointments with your specialists as you may have planned  Please return in 6 months, or sooner if needed, with Lab testing done 3-5 days before  

## 2016-01-20 NOTE — Assessment & Plan Note (Signed)
stable overall by history and exam, recent data reviewed with pt, and pt to continue medical treatment as before,  to f/u any worsening symptoms or concerns Lab Results  Component Value Date   LDLCALC 35 12/02/2015

## 2016-01-20 NOTE — Assessment & Plan Note (Signed)

## 2016-01-20 NOTE — Assessment & Plan Note (Signed)
C/w underlying djd/ddd, stable, cont same tx

## 2016-01-20 NOTE — Progress Notes (Signed)
Pre visit review using our clinic review tool, if applicable. No additional management support is needed unless otherwise documented below in the visit note. 

## 2016-01-20 NOTE — Assessment & Plan Note (Signed)
stable overall by history and exam, recent data reviewed with pt, and pt to continue medical treatment as before,  to f/u any worsening symptoms or concerns BP Readings from Last 3 Encounters:  01/20/16 130/72  12/02/15 140/90  10/01/15 120/80

## 2016-01-20 NOTE — Progress Notes (Signed)
Subjective:    Patient ID: Kent Nolan, male    DOB: 07-29-1929, 82 y.o.   MRN: [id number]  HPI  Here for wellness and f/u;  Overall doing ok;  Pt denies Chest pain, worsening SOB, DOE, wheezing, orthopnea, PND, worsening LE edema, palpitations, dizziness or syncope.  Pt denies neurological change such as new headache, facial or extremity weakness.  Pt denies polydipsia, polyuria, or low sugar symptoms. Pt states overall good compliance with treatment and medications, good tolerability, and has been trying to follow appropriate diet.  Pt denies worsening depressive symptoms, suicidal ideation or panic. No fever, night sweats, wt loss, loss of appetite, or other constitutional symptoms.  Pt states good ability with ADL's, has low fall risk, home safety reviewed and adequate, no other significant changes in hearing or vision, and only occasionally active with exercise.  Pt continues to have recurring LBP without change in severity, bowel or bladder change, fever, wt loss,  worsening LE pain/numbness/weakness, gait change or falls. Walks with cane, no falls.   Has new hearing aid for right ear, and new dentures.  No new complaints.  Has seen mult specialists, but now primarily only nephrology for stage 5 CKD, cardiology and optho.  Already had recent cpx labs.  Pt continues to have recurring LBP without change in severity, bowel or bladder change, fever, wt loss,  worsening LE pain/numbness/weakness, gait change or falls. Has seen Dr Smith/sport med with films with severe deg changes.  Takes2 tylenol at night for pain, and tolerates pain during day. Past Medical History  Diagnosis Date  . Coronary artery disease     remote subendo MI in February 2005 with stents to RCA &LCX; s/p scute thrombosis of both stents in February 2010 with cardiogenic shock - s/p BMS to both lesions; follow up cath in September 2010 was satisfactory.  He is medically managed with aspirin/Plavix  . PAF (paroxysmal atrial  fibrillation) (HCC)     s/p cardioversion in August 2011  . GERD (gastroesophageal reflux disease)   . Hyperlipidemia   . Chronic kidney disease     followed by nephrology  . Pacemaker   . HTN (hypertension)   . Obesity   . High risk medication use     on amiodarone for PAF/VT since February 2010  . BRADYCARDIA 08/04/2010    s/p PPM since 2011 - generator change 12/03/13  . Type II or unspecified type diabetes mellitus without mention of complication, uncontrolled    Past Surgical History  Procedure Laterality Date  . Pacemaker insertion  May 2011  . Pacemaker generator change  12/03/13    MDT Adapta L generator change for premature ERI by Dr Johney Frame  . Permanent pacemaker generator change N/A 12/03/2013    Procedure: PERMANENT PACEMAKER GENERATOR CHANGE;  Surgeon: [name], MD;  Location: [facility];  Service: Cardiovascular;  Laterality: N/A;  . Ep implantable device N/A 02/25/2015    Procedure: Lead Revision/Repair;  Surgeon: [name], MD;  Location: [facility];  Service: Cardiovascular;  Laterality: N/A;    reports that he has never smoked. He does not have any smokeless tobacco history on file. He reports that he does not drink alcohol or use illicit drugs. family history includes Diabetes in his father; Heart disease in his mother; Stomach cancer in his father. Allergies  Allergen Reactions  . Penicillins Other (See Comments)    Reaction unknown  . Sulfonamide Derivatives Other (See Comments)    Reaction unknown  Current Outpatient Prescriptions on File Prior to Visit  Medication Sig Dispense Refill  . acetaminophen (TYLENOL) 500 MG tablet Take 500 mg by mouth every 6 (six) hours as needed. For pain     . amiodarone (PACERONE) 200 MG tablet Take 0.5 tablets (100 mg total) by mouth daily. 30 tablet 10  . aspirin 81 MG tablet Take 81 mg by mouth daily.      . Cholecalciferol (VITAMIN D-3 PO) Take 1 tablet by mouth daily.    . clopidogrel (PLAVIX) 75 MG  tablet Take 1 tablet (75 mg total) by mouth daily. Restart Plavix on 03/02/15 30 tablet 10  . famotidine (PEPCID) 40 MG tablet TAKE 1 TABLET BY MOUTH DAILY 30 tablet 11  . fenofibrate (TRICOR) 145 MG tablet TAKE 1 TABLET BY MOUTH EVERY DAY 30 tablet 10  . Multiple Vitamin (MULTIVITAMIN) tablet Take 1 tablet by mouth daily.      . nitroGLYCERIN (NITROSTAT) 0.4 MG SL tablet Place 1 tablet (0.4 mg total) under the tongue every 5 (five) minutes as needed. For chest pain 25 tablet 5  . rosuvastatin (CRESTOR) 20 MG tablet TAKE 1 TABLET BY MOUTH EVERY DAY 90 tablet 3   No current facility-administered medications on file prior to visit.     Review of Systems Constitutional: Negative for increased diaphoresis, or other activity, appetite or siginficant weight change other than noted HENT: Negative for worsening hearing loss, ear pain, facial swelling, mouth sores and neck stiffness.   Eyes: Negative for other worsening pain, redness or visual disturbance.  Respiratory: Negative for choking or stridor Cardiovascular: Negative for other chest pain and palpitations.  Gastrointestinal: Negative for worsening diarrhea, blood in stool, or abdominal distention Genitourinary: Negative for hematuria, flank pain or change in urine volume.  Musculoskeletal: Negative for myalgias or other joint complaints.  Skin: Negative for other color change and wound or drainage.  Neurological: Negative for syncope and numbness. other than noted Hematological: Negative for adenopathy. or other swelling Psychiatric/Behavioral: Negative for hallucinations, SI, self-injury, decreased concentration or other worsening agitation.      Objective:   Physical Exam BP 130/72 mmHg  Pulse 93  Temp(Src) 97.7 F (36.5 C) (Oral)  Resp 20  Wt 184 lb (83.462 kg)  SpO2 97% VS noted,  Constitutional: Pt is oriented to person, place, and time. Appears well-developed and well-nourished, in no significant distress Head: Normocephalic  and atraumatic  Eyes: Conjunctivae and EOM are normal. Pupils are equal, round, and reactive to light Right Ear: External ear normal.  Left Ear: External ear normal Nose: Nose normal.  Mouth/Throat: Oropharynx is clear and moist  Neck: Normal range of motion. Neck supple. No JVD present. No tracheal deviation present or significant neck LA or mass Cardiovascular: Normal rate, regular rhythm, normal heart sounds and intact distal pulses.   Pulmonary/Chest: Effort normal and breath sounds without rales or wheezing  Abdominal: Soft. Bowel sounds are normal. NT. No HSM  Musculoskeletal: Normal range of motion. Exhibits no edema Lymphadenopathy: Has no cervical adenopathy.  Neurological: Pt is alert and oriented to person, place, and time. Pt has normal reflexes. No cranial nerve deficit. Motor grossly intact Skin: Skin is warm and dry. No rash noted or new ulcers Psychiatric:  Has normal mood and affect. Behavior is normal.   Lab Results  Component Value Date   WBC 6.2 12/02/2015   HGB 14.4 12/02/2015   HCT 43.0 12/02/2015   PLT 167 12/02/2015   GLUCOSE 117* 12/02/2015   CHOL 96* 12/02/2015  TRIG 148 12/02/2015   HDL 31* 12/02/2015   LDLDIRECT 45.4 07/23/2012   LDLCALC 35 12/02/2015   ALT 13 12/02/2015   AST 19 12/02/2015   NA 141 12/02/2015   K 4.3 12/02/2015   CL 105 12/02/2015   CREATININE 2.35* 12/02/2015   BUN 32* 12/02/2015   CO2 25 12/02/2015   TSH 2.68 12/02/2015   INR 1.1* 11/26/2013   HGBA1C 6.3 07/22/2015   MICROALBUR 1.7 07/22/2015       Assessment & Plan:

## 2016-01-20 NOTE — Assessment & Plan Note (Signed)
stable overall by history and exam, recent data reviewed with pt, and pt to continue medical treatment as before,  to f/u any worsening symptoms or concerns Lab Results  Component Value Date   CREATININE 2.35* 12/02/2015

## 2016-02-29 ENCOUNTER — Other Ambulatory Visit: Payer: Self-pay | Admitting: Nurse Practitioner

## 2016-03-03 ENCOUNTER — Ambulatory Visit (INDEPENDENT_AMBULATORY_CARE_PROVIDER_SITE_OTHER): Payer: Medicare HMO | Admitting: *Deleted

## 2016-03-03 DIAGNOSIS — I495 Sick sinus syndrome: Secondary | ICD-10-CM | POA: Diagnosis not present

## 2016-03-03 NOTE — Progress Notes (Signed)
Remote pacemaker transmission.   

## 2016-03-04 ENCOUNTER — Encounter: Payer: Self-pay | Admitting: Cardiology

## 2016-03-04 LAB — CUP PACEART REMOTE DEVICE CHECK
Battery Voltage: 2.79 V
Brady Statistic AP VP Percent: 51 %
Brady Statistic AS VS Percent: 6.1 %
Implantable Lead Implant Date: 20110531
Implantable Lead Location: 753860
Implantable Lead Model: 5076
Lead Channel Pacing Threshold Amplitude: 0.625 V
Lead Channel Pacing Threshold Amplitude: 0.875 V
Lead Channel Pacing Threshold Pulse Width: 0.4 ms
Lead Channel Pacing Threshold Pulse Width: 0.4 ms
Lead Channel Sensing Intrinsic Amplitude: 5.6 mV
Lead Channel Setting Pacing Pulse Width: 0.4 ms
Lead Channel Setting Sensing Sensitivity: 2 mV
MDC IDC LEAD IMPLANT DT: 20160622
MDC IDC LEAD LOCATION: 753859
MDC IDC LEAD SERIAL: 677341
MDC IDC MSMT BATTERY IMPEDANCE: 257 Ohm
MDC IDC MSMT BATTERY REMAINING LONGEVITY: 102 mo
MDC IDC MSMT LEADCHNL RA IMPEDANCE VALUE: 479 Ohm
MDC IDC MSMT LEADCHNL RV IMPEDANCE VALUE: 375 Ohm
MDC IDC SESS DTM: 20170630125853
MDC IDC SET LEADCHNL RA PACING AMPLITUDE: 2 V
MDC IDC SET LEADCHNL RV PACING AMPLITUDE: 2.5 V
MDC IDC STAT BRADY AP VS PERCENT: 36.5 %
MDC IDC STAT BRADY AS VP PERCENT: 6.4 %

## 2016-03-24 ENCOUNTER — Other Ambulatory Visit: Payer: Self-pay | Admitting: Nurse Practitioner

## 2016-05-19 DIAGNOSIS — R69 Illness, unspecified: Secondary | ICD-10-CM | POA: Diagnosis not present

## 2016-05-27 ENCOUNTER — Encounter: Payer: Self-pay | Admitting: Internal Medicine

## 2016-05-30 DIAGNOSIS — N184 Chronic kidney disease, stage 4 (severe): Secondary | ICD-10-CM | POA: Diagnosis not present

## 2016-05-30 DIAGNOSIS — N189 Chronic kidney disease, unspecified: Secondary | ICD-10-CM | POA: Diagnosis not present

## 2016-05-30 DIAGNOSIS — N2581 Secondary hyperparathyroidism of renal origin: Secondary | ICD-10-CM | POA: Diagnosis not present

## 2016-06-03 ENCOUNTER — Other Ambulatory Visit: Payer: Self-pay | Admitting: Internal Medicine

## 2016-06-06 DIAGNOSIS — N183 Chronic kidney disease, stage 3 (moderate): Secondary | ICD-10-CM | POA: Diagnosis not present

## 2016-06-06 DIAGNOSIS — N2581 Secondary hyperparathyroidism of renal origin: Secondary | ICD-10-CM | POA: Diagnosis not present

## 2016-06-06 DIAGNOSIS — D631 Anemia in chronic kidney disease: Secondary | ICD-10-CM | POA: Diagnosis not present

## 2016-06-06 DIAGNOSIS — I129 Hypertensive chronic kidney disease with stage 1 through stage 4 chronic kidney disease, or unspecified chronic kidney disease: Secondary | ICD-10-CM | POA: Diagnosis not present

## 2016-06-15 ENCOUNTER — Ambulatory Visit (INDEPENDENT_AMBULATORY_CARE_PROVIDER_SITE_OTHER): Payer: Medicare HMO | Admitting: Internal Medicine

## 2016-06-15 ENCOUNTER — Encounter: Payer: Self-pay | Admitting: Internal Medicine

## 2016-06-15 ENCOUNTER — Encounter (INDEPENDENT_AMBULATORY_CARE_PROVIDER_SITE_OTHER): Payer: Self-pay

## 2016-06-15 VITALS — BP 122/82 | HR 83 | Ht 67.0 in | Wt 184.2 lb

## 2016-06-15 DIAGNOSIS — Z950 Presence of cardiac pacemaker: Secondary | ICD-10-CM

## 2016-06-15 DIAGNOSIS — I2510 Atherosclerotic heart disease of native coronary artery without angina pectoris: Secondary | ICD-10-CM

## 2016-06-15 DIAGNOSIS — E7800 Pure hypercholesterolemia, unspecified: Secondary | ICD-10-CM | POA: Diagnosis not present

## 2016-06-15 DIAGNOSIS — I495 Sick sinus syndrome: Secondary | ICD-10-CM | POA: Diagnosis not present

## 2016-06-15 NOTE — Patient Instructions (Signed)
Medication Instructions:  Your physician recommends that you continue on your current medications as directed. Please refer to the Current Medication list given to you today.   Labwork: None ordered   Testing/Procedures: None ordered   Follow-Up: Your physician wants you to follow-up in: 12 months with [name] Bonita QuinYou will receive a reminder letter in the mail two months in advance. If you don't receive a letter, please call our office to schedule the follow-up appointment.   Remote monitoring is used to monitor your Pacemaker  from home. This monitoring reduces the number of office visits required to check your device to one time per year. It allows us to keep an eye on the functioning of your device to ensure it is working properly. You are scheduled for a device check from home on 09/14/16. You may send your transmission at any time that day. If you have a wireless device, the transmission will be sent automatically. After your physician reviews your transmission, you will receive a postcard with your next transmission date.     Any Other Special Instructions Will Be Listed Below (If Applicable).     If you need a refill on your cardiac medications before your next appointment, please call your pharmacy.

## 2016-06-15 NOTE — Progress Notes (Signed)
Electrophysiology Office Note   Date:  06/15/2016   ID:  Paralee CancelWilliam P Peel, DOB June 12, 1929, MRN [id number]  PCP:  Oliver BarreJames John, MD   Primary Electrophysiologist: Hillis RangeJames Eliska Hamil, MD    Chief Complaint  Patient presents with  . Atrial Fibrillation     History of Present Illness: Kent GammaWilliam P Bagnell is a 82 y.o. male who presents today for electrophysiology evaluation.   Doing well. Walks slowly with a walker.   Today, he denies symptoms of palpitations, chest pain, shortness of breath, orthopnea, PND, lower extremity edema, claudication, dizziness, presyncope, syncope, bleeding, or neurologic sequela. The patient is tolerating medications without difficulties and is otherwise without complaint today.    Past Medical History:  Diagnosis Date  . BRADYCARDIA 08/04/2010   s/p PPM since 2011 - generator change 12/03/13  . Chronic kidney disease    followed by nephrology  . Coronary artery disease    remote subendo MI in February 2005 with stents to RCA &LCX; s/p scute thrombosis of both stents in February 2010 with cardiogenic shock - s/p BMS to both lesions; follow up cath in September 2010 was satisfactory.  He is medically managed with aspirin/Plavix  . GERD (gastroesophageal reflux disease)   . High risk medication use    on amiodarone for PAF/VT since February 2010  . HTN (hypertension)   . Hyperlipidemia   . Obesity   . Pacemaker   . PAF (paroxysmal atrial fibrillation) (HCC)    s/p cardioversion in August 2011  . Type II or unspecified type diabetes mellitus without mention of complication, uncontrolled    Past Surgical History:  Procedure Laterality Date  . EP IMPLANTABLE DEVICE N/A 02/25/2015   Procedure: Lead Revision/Repair;  Surgeon: Hillis RangeJames Japheth Diekman, MD;  Location: [facility];  Service: Cardiovascular;  Laterality: N/A;  . PACEMAKER GENERATOR CHANGE  12/03/13   MDT Adapta L generator change for premature ERI by [name]  . PACEMAKER INSERTION  May 2011  . PERMANENT  PACEMAKER GENERATOR CHANGE N/A 12/03/2013   Procedure: PERMANENT PACEMAKER GENERATOR CHANGE;  Surgeon: Gardiner RhymeJames D Cythia Bachtel, MD;  Location: [facility];  Service: Cardiovascular;  Laterality: N/A;     Current Outpatient Prescriptions  Medication Sig Dispense Refill  . acetaminophen (TYLENOL) 500 MG tablet Take 500 mg by mouth every 6 (six) hours as needed. For pain     . amiodarone (PACERONE) 200 MG tablet TAKE 1/2 TABLET(100 MG) BY MOUTH DAILY 30 tablet 5  . aspirin 81 MG tablet Take 81 mg by mouth daily.      . Cholecalciferol (VITAMIN D-3 PO) Take 1 tablet by mouth daily.    . clopidogrel (PLAVIX) 75 MG tablet Take 1 tablet (75 mg total) by mouth daily. 30 tablet 6  . famotidine (PEPCID) 40 MG tablet TAKE 1 TABLET BY MOUTH DAILY 30 tablet 11  . fenofibrate (TRICOR) 145 MG tablet TAKE 1 TABLET BY MOUTH EVERY DAY 30 tablet 10  . Multiple Vitamin (MULTIVITAMIN) tablet Take 1 tablet by mouth daily.      . nitroGLYCERIN (NITROSTAT) 0.4 MG SL tablet Place 1 tablet (0.4 mg total) under the tongue every 5 (five) minutes as needed. For chest pain 25 tablet 5  . rosuvastatin (CRESTOR) 20 MG tablet TAKE 1 TABLET BY MOUTH EVERY DAY 90 tablet 3   No current facility-administered medications for this visit.     Allergies:   Penicillins and Sulfonamide derivatives   Social History:  The patient  reports that he has quit smoking.  His smoking use included Cigarettes. He smoked 3.00 packs per day. He has never used smokeless tobacco. He reports that he does not drink alcohol or use drugs.   Family History:  The patient's family history includes Diabetes in his father; Heart disease in his mother; Stomach cancer in his father.    ROS:  Please see the history of present illness.   All other systems are reviewed and negative.    PHYSICAL EXAM: VS:  BP 122/82   Pulse 83   Ht 5\' 7"  (1.702 m)   Wt 184 lb 3.2 oz (83.6 kg)   BMI 28.85 kg/m  , BMI Body mass index is 28.85 kg/m. GEN: elderly and frail, in no  acute distress  HEENT: normal  Neck: no JVD, carotid bruits, or masses Cardiac: RRR;  Respiratory:  clear to auscultation bilaterally, normal work of breathing GI: soft, nontender, nondistended, + BS MS: walks slowly with a walker  Skin: diffuse bruising on both arms, R sided device pocket is well healed Neuro:  Strength and sensation are intact Psych: euthymic mood, full affect  EKG:  EKG is ordered today. The ekg ordered today shows AV paced rhythm  Device interrogation is reviewed today in detail.  See PaceArt for details.   Recent Labs: 12/02/2015: ALT 13; BUN 32; Creat 2.35; Hemoglobin 14.4; Platelets 167; Potassium 4.3; Sodium 141; TSH 2.68    Lipid Panel     Component Value Date/Time   CHOL 96 (L) 12/02/2015 1058   TRIG 148 12/02/2015 1058   HDL 31 (L) 12/02/2015 1058   CHOLHDL 3.1 12/02/2015 1058   VLDL 30 12/02/2015 1058   LDLCALC 35 12/02/2015 1058   LDLDIRECT 45.4 07/23/2012 0939     Wt Readings from Last 3 Encounters:  06/15/16 184 lb 3.2 oz (83.6 kg)  01/20/16 184 lb (83.5 kg)  12/02/15 188 lb 6.4 oz (85.5 kg)      ASSESSMENT AND PLAN:    1. Sinus node dysfunction Normal pacemaker function See Pace Art report No changes today  2. afib He is on ASA and Plavix for afib.  He is unsteady at times and does not feel that he would like to consider anticoagulation. No afib in over a year. No changes at this time. Labs 3/17 reviewed Will need repeat tfts, lfts upon return  3. CAD No ischemic symptoms He has a taxus stent placed for ACS.  Should probably continue plavix if able.  May stop ASA if bruising worsens.  4. HL Stable No change required today  carelink every 3 months Follow-up with Lawson Fiscal in 6 months I will see in a year   Signed, [name], MD  06/15/2016 11:54 AM     [facility] [address] [place] [address] [phone] (office) [phone] (fax)

## 2016-06-22 DIAGNOSIS — E785 Hyperlipidemia, unspecified: Secondary | ICD-10-CM | POA: Diagnosis not present

## 2016-06-22 DIAGNOSIS — I252 Old myocardial infarction: Secondary | ICD-10-CM | POA: Diagnosis not present

## 2016-06-22 DIAGNOSIS — I2510 Atherosclerotic heart disease of native coronary artery without angina pectoris: Secondary | ICD-10-CM | POA: Diagnosis not present

## 2016-06-22 DIAGNOSIS — I480 Paroxysmal atrial fibrillation: Secondary | ICD-10-CM | POA: Diagnosis not present

## 2016-06-22 DIAGNOSIS — K219 Gastro-esophageal reflux disease without esophagitis: Secondary | ICD-10-CM | POA: Diagnosis not present

## 2016-06-22 DIAGNOSIS — Z0000 Encounter for general adult medical examination without abnormal findings: Secondary | ICD-10-CM | POA: Diagnosis not present

## 2016-06-28 DIAGNOSIS — H25813 Combined forms of age-related cataract, bilateral: Secondary | ICD-10-CM | POA: Diagnosis not present

## 2016-06-28 DIAGNOSIS — H52223 Regular astigmatism, bilateral: Secondary | ICD-10-CM | POA: Diagnosis not present

## 2016-06-28 DIAGNOSIS — H5203 Hypermetropia, bilateral: Secondary | ICD-10-CM | POA: Diagnosis not present

## 2016-06-28 DIAGNOSIS — H43813 Vitreous degeneration, bilateral: Secondary | ICD-10-CM | POA: Diagnosis not present

## 2016-06-28 DIAGNOSIS — H04203 Unspecified epiphora, bilateral lacrimal glands: Secondary | ICD-10-CM | POA: Diagnosis not present

## 2016-06-28 DIAGNOSIS — H04123 Dry eye syndrome of bilateral lacrimal glands: Secondary | ICD-10-CM | POA: Diagnosis not present

## 2016-06-28 DIAGNOSIS — H524 Presbyopia: Secondary | ICD-10-CM | POA: Diagnosis not present

## 2016-06-28 LAB — HM DIABETES EYE EXAM

## 2016-07-15 LAB — CUP PACEART INCLINIC DEVICE CHECK
Battery Voltage: 2.79 V
Brady Statistic AP VS Percent: 33 %
Brady Statistic AS VP Percent: 6 %
Brady Statistic AS VS Percent: 5 %
Date Time Interrogation Session: 20171011151922
Implantable Lead Implant Date: 20110531
Implantable Lead Implant Date: 20160622
Lead Channel Impedance Value: 392 Ohm
Lead Channel Pacing Threshold Amplitude: 0.75 V
Lead Channel Pacing Threshold Pulse Width: 0.4 ms
Lead Channel Sensing Intrinsic Amplitude: 5.6 mV
Lead Channel Setting Sensing Sensitivity: 2 mV
MDC IDC LEAD LOCATION: 753859
MDC IDC LEAD LOCATION: 753860
MDC IDC LEAD SERIAL: 677341
MDC IDC MSMT BATTERY IMPEDANCE: 281 Ohm
MDC IDC MSMT BATTERY REMAINING LONGEVITY: 100 mo
MDC IDC MSMT LEADCHNL RA IMPEDANCE VALUE: 517 Ohm
MDC IDC MSMT LEADCHNL RA PACING THRESHOLD AMPLITUDE: 0.75 V
MDC IDC MSMT LEADCHNL RA SENSING INTR AMPL: 2 mV
MDC IDC MSMT LEADCHNL RV PACING THRESHOLD PULSEWIDTH: 0.4 ms
MDC IDC PG IMPLANT DT: 20160622
MDC IDC SET LEADCHNL RA PACING AMPLITUDE: 2 V
MDC IDC SET LEADCHNL RV PACING AMPLITUDE: 2.5 V
MDC IDC SET LEADCHNL RV PACING PULSEWIDTH: 0.4 ms
MDC IDC STAT BRADY AP VP PERCENT: 56 %

## 2016-08-01 ENCOUNTER — Other Ambulatory Visit: Payer: Self-pay | Admitting: Internal Medicine

## 2016-08-14 ENCOUNTER — Other Ambulatory Visit: Payer: Self-pay | Admitting: Internal Medicine

## 2016-09-14 ENCOUNTER — Ambulatory Visit (INDEPENDENT_AMBULATORY_CARE_PROVIDER_SITE_OTHER): Payer: Medicare HMO | Admitting: *Deleted

## 2016-09-14 DIAGNOSIS — I495 Sick sinus syndrome: Secondary | ICD-10-CM

## 2016-09-14 NOTE — Progress Notes (Signed)
Remote pacemaker transmission.   

## 2016-09-15 ENCOUNTER — Encounter: Payer: Self-pay | Admitting: Cardiology

## 2016-09-26 LAB — CUP PACEART REMOTE DEVICE CHECK
Brady Statistic AP VP Percent: 44.2 %
Brady Statistic AP VS Percent: 45 %
Brady Statistic AS VS Percent: 5.3 %
Date Time Interrogation Session: 20180122084629
Implantable Lead Implant Date: 20160622
Implantable Lead Location: 753859
Implantable Lead Model: 5076
Lead Channel Impedance Value: 381 Ohm
Lead Channel Impedance Value: 479 Ohm
Lead Channel Pacing Threshold Pulse Width: 0.4 ms
Lead Channel Setting Pacing Amplitude: 2 V
Lead Channel Setting Pacing Amplitude: 2.5 V
Lead Channel Setting Pacing Pulse Width: 0.4 ms
Lead Channel Setting Sensing Sensitivity: 2 mV
MDC IDC LEAD IMPLANT DT: 20110531
MDC IDC LEAD LOCATION: 753860
MDC IDC LEAD SERIAL: 677341
MDC IDC MSMT LEADCHNL RA PACING THRESHOLD AMPLITUDE: 0.75 V
MDC IDC MSMT LEADCHNL RA PACING THRESHOLD PULSEWIDTH: 0.4 ms
MDC IDC MSMT LEADCHNL RV PACING THRESHOLD AMPLITUDE: 0.875 V
MDC IDC MSMT LEADCHNL RV SENSING INTR AMPL: 5.6 mV
MDC IDC PG IMPLANT DT: 20160622
MDC IDC STAT BRADY AS VP PERCENT: 5.5 %

## 2016-10-16 ENCOUNTER — Other Ambulatory Visit: Payer: Self-pay | Admitting: Nurse Practitioner

## 2016-10-22 DIAGNOSIS — J069 Acute upper respiratory infection, unspecified: Secondary | ICD-10-CM | POA: Diagnosis not present

## 2016-10-22 DIAGNOSIS — R05 Cough: Secondary | ICD-10-CM | POA: Diagnosis not present

## 2016-12-06 DIAGNOSIS — N183 Chronic kidney disease, stage 3 (moderate): Secondary | ICD-10-CM | POA: Diagnosis not present

## 2016-12-06 DIAGNOSIS — N189 Chronic kidney disease, unspecified: Secondary | ICD-10-CM | POA: Diagnosis not present

## 2016-12-06 DIAGNOSIS — N2581 Secondary hyperparathyroidism of renal origin: Secondary | ICD-10-CM | POA: Diagnosis not present

## 2016-12-11 ENCOUNTER — Other Ambulatory Visit: Payer: Self-pay | Admitting: Internal Medicine

## 2016-12-12 DIAGNOSIS — D631 Anemia in chronic kidney disease: Secondary | ICD-10-CM | POA: Diagnosis not present

## 2016-12-12 DIAGNOSIS — N2581 Secondary hyperparathyroidism of renal origin: Secondary | ICD-10-CM | POA: Diagnosis not present

## 2016-12-12 DIAGNOSIS — N183 Chronic kidney disease, stage 3 (moderate): Secondary | ICD-10-CM | POA: Diagnosis not present

## 2016-12-12 DIAGNOSIS — I129 Hypertensive chronic kidney disease with stage 1 through stage 4 chronic kidney disease, or unspecified chronic kidney disease: Secondary | ICD-10-CM | POA: Diagnosis not present

## 2016-12-13 NOTE — Progress Notes (Signed)
CARDIOLOGY OFFICE NOTE  Date:  12/14/2016    Kent Nolan Date of Birth: December 08, 1928 Medical Record #[id number]  PCP:  [name], MD  Cardiologist:  [name]    Chief Complaint  Patient presents with  . Coronary Artery Disease    Follow up visit - seen for [name]    History of Present Illness: Kent Nolan is a 83 y.o. male who presents today for a one year visit with me.  Seen for [name]. Former patient of [name].   He has known ischemic heart disease with prior subendocardial MI in February of 2005 with stents to the RCA and LCX. He experienced acute thrombosis of both stents in February of 2010 that was associated with cardiogenic shock and had repeat stenting to both lesions at that time with non drug eluting stents placed. Follow up cath in September of 2010 was satisfactory. He is on chronic Plavix.   Other problems include DM, HTN, GERD, ACE related cough, HLD, PAF and VT back in 2010 and on chronic amiodarone therapy, CRI and remote UTIs. He does have an underlying pacemaker in place since 2011 and has had prior cardioversion for his atrial fib back in August of 2011. While he does have an elevated CHADS score, he has been maintained on chronic Plavix and aspirin therapy in light of his stent thrombosis - he has declined anticoagulation with coumadin/NOVAC - has had no recurrent atrial fib. EF by echo from 2010 was 50 to 55%. Last generator change was in March of 2015.   I saw him back in February of 2015 - he was dizzy. Had "fallen out" several times since he was here - no significant injury yet. We did extensive testing at that time which included Myoview, echo, CT etc. Felt to have some degree of orthostasis. Has also had his generator replaced at the end of March 2015 per [name].   Seen by me in July of 2015. Still dizzy. Pretty limited from back pain - had been to see his PCP and was given Vicodin, muscle relaxer and prednisone.  Then saw [name] back in July as well. Device check ok. He was quite orthostatic at that visit here - almost passed out while weighing. Was using narcotics and felt to be sedated as well. Lasix was stopped. Did not wish to change to coumadin/NOAC but prefers to stay on his Plavix and aspirin therapy.  I have seen him several times since - mostly limited by his backpain. He has had his atrial lead revised on his pacemaker. Dose of amiodarone has been able to be cut back.   I last saw him a year ago - seemed to be holding his own. Had had a prior syncopal spell that he did not seek attention for. I cautioned him about this.   Comes in today. Here with his wife. They have both had colds but no flu. Still with lots of back pain. Had some chest pressure that was associated with his cough. This is now gone. Breathing is stable - not really exerting himself. He sits most of the day. Uses his walker. Wife tries to encourage him as much as possible - it is difficult. No falls. Brought labs from [name] - his kidney function is fairly stable for him. CBC ok. Needs other surveillance labs today. Seeing PCP later this spring.   Past Medical History:  Diagnosis Date  . BRADYCARDIA 08/04/2010   s/p  PPM since 2011 - generator change 12/03/13  . Chronic kidney disease    followed by nephrology  . Coronary artery disease    remote subendo MI in February 2005 with stents to RCA &LCX; s/p scute thrombosis of both stents in February 2010 with cardiogenic shock - s/p BMS to both lesions; follow up cath in September 2010 was satisfactory.  He is medically managed with aspirin/Plavix  . GERD (gastroesophageal reflux disease)   . High risk medication use    on amiodarone for PAF/VT since February 2010  . HTN (hypertension)   . Hyperlipidemia   . Obesity   . Pacemaker   . PAF (paroxysmal atrial fibrillation) (HCC)    s/p cardioversion in August 2011  . Type II or unspecified type diabetes mellitus without  mention of complication, uncontrolled     Past Surgical History:  Procedure Laterality Date  . EP IMPLANTABLE DEVICE N/A 02/25/2015   Procedure: Lead Revision/Repair;  Surgeon: [name], MD;  Location: [facility];  Service: Cardiovascular;  Laterality: N/A;  . PACEMAKER GENERATOR CHANGE  12/03/13   MDT Adapta L generator change for premature ERI by Dr Johney Frame  . PACEMAKER INSERTION  May 2011  . PERMANENT PACEMAKER GENERATOR CHANGE N/A 12/03/2013   Procedure: PERMANENT PACEMAKER GENERATOR CHANGE;  Surgeon: [name], MD;  Location: [facility]ATH [facility];  Service: Cardiovascular;  Laterality: N/A;     Medications: Current Outpatient Prescriptions  Medication Sig Dispense Refill  . acetaminophen (TYLENOL) 500 MG tablet Take 500 mg by mouth every 6 (six) hours as needed. For pain     . amiodarone (PACERONE) 200 MG tablet TAKE 1/2 TABLET(100 MG) BY MOUTH DAILY 30 tablet 5  . aspirin 81 MG tablet Take 81 mg by mouth daily.      . Cholecalciferol (VITAMIN D-3 PO) Take 1 tablet by mouth daily.    . clopidogrel (PLAVIX) 75 MG tablet TAKE 1 TABLET BY MOUTH EVERY DAY 30 tablet 7  . famotidine (PEPCID) 40 MG tablet TAKE 1 TABLET BY MOUTH EVERY DAY 60 tablet 0  . fenofibrate (TRICOR) 145 MG tablet TAKE 1 TABLET BY MOUTH EVERY DAY 30 tablet 10  . Multiple Vitamin (MULTIVITAMIN) tablet Take 1 tablet by mouth daily.      . nitroGLYCERIN (NITROSTAT) 0.4 MG SL tablet Place 1 tablet (0.4 mg total) under the tongue every 5 (five) minutes as needed. For chest pain 25 tablet 5  . rosuvastatin (CRESTOR) 20 MG tablet TAKE 1 TABLET BY MOUTH EVERY DAY 90 tablet 3   No current facility-administered medications for this visit.     Allergies: Allergies  Allergen Reactions  . Penicillins Other (See Comments)    Reaction unknown  . Sulfonamide Derivatives Other (See Comments)    Reaction unknown    Social History: The patient  reports that he has quit smoking. His smoking use included Cigarettes. He  smoked 3.00 packs per day. He has never used smokeless tobacco. He reports that he does not drink alcohol or use drugs.   Family History: The patient's family history includes Diabetes in his father; Heart disease in his mother; Stomach cancer in his father.   Review of Systems: Please see the history of present illness.   Otherwise, the review of systems is positive for none.   All other systems are reviewed and negative.   Physical Exam: VS:  BP 140/72   Pulse 82   Ht  (1.702 m)   Wt 177 lb 12.8 oz (  80.6 kg)   BMI 27.85 kg/m  .  BMI Body mass index is 27.85 kg/m.  Wt Readings from Last 3 Encounters:  12/14/16 177 lb 12.8 oz (80.6 kg)  06/15/16 184 lb 3.2 oz (83.6 kg)  01/20/16 184 lb (83.5 kg)    General: Pleasant. Elderly male. He is hard of hearing. Weight is down 7 pounds. He is alert and in no acute distress. Using his walker.   HEENT: Normal.  Neck: Supple, no JVD, carotid bruits, or masses noted.  Cardiac: Regular rate and rhythm. Heart tones are distant. . No edema.  Respiratory:  Lungs are clear to auscultation bilaterally with normal work of breathing.  GI: Soft and nontender.  MS: No deformity or atrophy. Gait and ROM intact.  Skin: Warm and dry. Color is normal.  Neuro:  Strength and sensation are intact and no gross focal deficits noted.  Psych: Alert, appropriate and with normal affect.   LABORATORY DATA:  EKG:  EKG is ordered today. This demonstrates AV pacing.  Lab Results  Component Value Date   WBC 6.2 12/02/2015   HGB 14.4 12/02/2015   HCT 43.0 12/02/2015   PLT 167 12/02/2015   GLUCOSE 117 (H) 12/02/2015   CHOL 96 (L) 12/02/2015   TRIG 148 12/02/2015   HDL 31 (L) 12/02/2015   LDLDIRECT 45.4 07/23/2012   LDLCALC 35 12/02/2015   ALT 13 12/02/2015   AST 19 12/02/2015   NA 141 12/02/2015   K 4.3 12/02/2015   CL 105 12/02/2015   CREATININE 2.35 (H) 12/02/2015   BUN 32 (H) 12/02/2015   CO2 25 12/02/2015   TSH 2.68 12/02/2015   INR 1.1 (H)  11/26/2013   HGBA1C 6.3 07/22/2015   MICROALBUR 1.7 07/22/2015    BNP (last 3 results) No results for input(s): BNP in the last 8760 hours.  ProBNP (last 3 results) No results for input(s): PROBNP in the last 8760 hours.   Other Studies Reviewed Today:  Echo Study Conclusions from February 2015  - Left ventricle: The cavity size was normal. Systolic function was mildly reduced. The estimated ejection fraction was in the range of 45% to 50%. There was an increased relative contribution of atrial contraction to ventricular filling. Doppler parameters are consistent with abnormal left ventricular relaxation (grade 1 diastolic dysfunction). - Aortic valve: Moderate thickening and calcification, consistent with sclerosis. - Aorta: Aortic root dimension: 38mm (ED). - Ascending aorta: The ascending aorta was mildly dilated. - Mitral valve: Moderate regurgitation. Valve area by pressure half-time: 1.86cm^2.  Myoview Overall Impression from 10/2013:  Intermediate risk stress nuclear study with  large (35% of myocardium) mostly fixed inferolateral defect  suggesting of LCx territory scar. No significant reversible  ischemia.  LV Wall Motion: LVEF 47%, with inferolateral akinesis  Chrystie Nose, MD, [facility] Board Certified in Nuclear Cardiology Attending Cardiologist [facility]   Assessment/Plan:  1. Sinus node dysfunction - pacemaker in place - prior atrial lead revision from 2016. Followed by [name].   2. PAF - He is on ASA and Plavix for his CAD.  He remains pretty unsteady - does not wish to take further anticoagulation.  If AF burden increases would consider LA appendage closure. He looks fairly stable for now.   3. CAD No ischemic symptoms reported - I have left him on his current regimen. He has a taxus stent in place.   4. HLD - on statin - lab today  5. High risk therapy - obtain the rest of his  surveillance labs today.   6. Fall/?syncope - has  not recurred  7. LV dysfunction - seems fairly stable on his current regimen. Would favor conservative approach.   Current medicines are reviewed with the patient today.  The patient does not have concerns regarding medicines other than what has been noted above.  The following changes have been made:  See above.  Labs/ tests ordered today include:    Orders Placed This Encounter  Procedures  . Hepatic function panel  . TSH  . Lipid panel  . EKG 12-Lead     Disposition:   FU with me in one year. See [name] in October.   Patient is agreeable to this plan and will call if any problems develop in the interim.   SignedNorma Fredrickson, NP  12/14/2016 10:42 AM  [facility] [address] [place], [place]  [address] Phone: [phone] Fax: [phone]

## 2016-12-14 ENCOUNTER — Ambulatory Visit (INDEPENDENT_AMBULATORY_CARE_PROVIDER_SITE_OTHER): Payer: Medicare HMO | Admitting: *Deleted

## 2016-12-14 ENCOUNTER — Encounter: Payer: Self-pay | Admitting: Nurse Practitioner

## 2016-12-14 ENCOUNTER — Ambulatory Visit (INDEPENDENT_AMBULATORY_CARE_PROVIDER_SITE_OTHER): Payer: Medicare HMO | Admitting: Nurse Practitioner

## 2016-12-14 ENCOUNTER — Encounter (INDEPENDENT_AMBULATORY_CARE_PROVIDER_SITE_OTHER): Payer: Self-pay

## 2016-12-14 VITALS — BP 140/72 | HR 82 | Ht 67.0 in | Wt 177.8 lb

## 2016-12-14 DIAGNOSIS — I495 Sick sinus syndrome: Secondary | ICD-10-CM

## 2016-12-14 DIAGNOSIS — E7800 Pure hypercholesterolemia, unspecified: Secondary | ICD-10-CM

## 2016-12-14 DIAGNOSIS — I480 Paroxysmal atrial fibrillation: Secondary | ICD-10-CM

## 2016-12-14 DIAGNOSIS — I2510 Atherosclerotic heart disease of native coronary artery without angina pectoris: Secondary | ICD-10-CM | POA: Diagnosis not present

## 2016-12-14 DIAGNOSIS — I10 Essential (primary) hypertension: Secondary | ICD-10-CM

## 2016-12-14 DIAGNOSIS — Z950 Presence of cardiac pacemaker: Secondary | ICD-10-CM | POA: Diagnosis not present

## 2016-12-14 DIAGNOSIS — Z79899 Other long term (current) drug therapy: Secondary | ICD-10-CM | POA: Diagnosis not present

## 2016-12-14 NOTE — Progress Notes (Signed)
Remote pacemaker transmission.   

## 2016-12-14 NOTE — Patient Instructions (Addendum)
We will be checking the following labs today - HPF, lipids and TSH   Medication Instructions:    Continue with your current medicines.     Testing/Procedures To Be Arranged:  N/A  Follow-Up:   See me in one year  See [name] in October    Other Special Instructions:   N/A    If you need a refill on your cardiac medications before your next appointment, please call your pharmacy.   Call the [facility] office at [phone] if you have any questions, problems or concerns.

## 2016-12-15 ENCOUNTER — Encounter: Payer: Self-pay | Admitting: Cardiology

## 2016-12-15 LAB — LIPID PANEL
Chol/HDL Ratio: 2.9 ratio (ref 0.0–5.0)
Cholesterol, Total: 106 mg/dL (ref 100–199)
HDL: 36 mg/dL — ABNORMAL LOW (ref >39)
LDL Calculated: 45 mg/dL (ref 0–99)
Triglycerides: 123 mg/dL (ref 0–149)
VLDL Cholesterol Cal: 25 mg/dL (ref 5–40)

## 2016-12-15 LAB — HEPATIC FUNCTION PANEL
ALT: 15 IU/L (ref 0–44)
AST: 24 IU/L (ref 0–40)
Albumin: 4 g/dL (ref 3.5–4.7)
Alkaline Phosphatase: 66 IU/L (ref 39–117)
Bilirubin Total: 0.6 mg/dL (ref 0.0–1.2)
Bilirubin, Direct: 0.21 mg/dL (ref 0.00–0.40)
Total Protein: 6.7 g/dL (ref 6.0–8.5)

## 2016-12-15 LAB — TSH: TSH: 3.62 u[IU]/mL (ref 0.450–4.500)

## 2016-12-16 LAB — CUP PACEART REMOTE DEVICE CHECK
Battery Impedance: 331 Ohm
Battery Remaining Longevity: 98 mo
Brady Statistic AP VP Percent: 33 %
Brady Statistic AP VS Percent: 54 %
Brady Statistic AS VS Percent: 10 %
Date Time Interrogation Session: 20180411115403
Implantable Lead Implant Date: 20110531
Implantable Lead Location: 753859
Implantable Lead Location: 753860
Lead Channel Impedance Value: 385 Ohm
Lead Channel Pacing Threshold Pulse Width: 0.4 ms
Lead Channel Sensing Intrinsic Amplitude: 5.6 mV
Lead Channel Setting Pacing Amplitude: 2.5 V
Lead Channel Setting Sensing Sensitivity: 2 mV
MDC IDC LEAD IMPLANT DT: 20160622
MDC IDC LEAD SERIAL: 677341
MDC IDC MSMT BATTERY VOLTAGE: 2.79 V
MDC IDC MSMT LEADCHNL RA IMPEDANCE VALUE: 486 Ohm
MDC IDC MSMT LEADCHNL RA PACING THRESHOLD AMPLITUDE: 0.75 V
MDC IDC MSMT LEADCHNL RA PACING THRESHOLD PULSEWIDTH: 0.4 ms
MDC IDC MSMT LEADCHNL RV PACING THRESHOLD AMPLITUDE: 0.875 V
MDC IDC PG IMPLANT DT: 20160622
MDC IDC SET LEADCHNL RA PACING AMPLITUDE: 2 V
MDC IDC SET LEADCHNL RV PACING PULSEWIDTH: 0.4 ms
MDC IDC STAT BRADY AS VP PERCENT: 4 %

## 2017-02-15 ENCOUNTER — Other Ambulatory Visit: Payer: Self-pay | Admitting: Internal Medicine

## 2017-02-16 NOTE — Telephone Encounter (Signed)
Pharmacy requesting a refill on Famotidine 40 mg tablet. Would you like to refill this medication? Please advise

## 2017-03-15 ENCOUNTER — Ambulatory Visit (INDEPENDENT_AMBULATORY_CARE_PROVIDER_SITE_OTHER): Payer: Medicare HMO | Admitting: *Deleted

## 2017-03-15 DIAGNOSIS — I495 Sick sinus syndrome: Secondary | ICD-10-CM | POA: Diagnosis not present

## 2017-03-15 NOTE — Progress Notes (Signed)
Remote pacemaker transmission.   

## 2017-03-16 DIAGNOSIS — I252 Old myocardial infarction: Secondary | ICD-10-CM | POA: Diagnosis not present

## 2017-03-16 DIAGNOSIS — Z7982 Long term (current) use of aspirin: Secondary | ICD-10-CM | POA: Diagnosis not present

## 2017-03-16 DIAGNOSIS — E785 Hyperlipidemia, unspecified: Secondary | ICD-10-CM | POA: Diagnosis not present

## 2017-03-16 DIAGNOSIS — Z6829 Body mass index (BMI) 29.0-29.9, adult: Secondary | ICD-10-CM | POA: Diagnosis not present

## 2017-03-16 DIAGNOSIS — R233 Spontaneous ecchymoses: Secondary | ICD-10-CM | POA: Diagnosis not present

## 2017-03-16 DIAGNOSIS — Z7902 Long term (current) use of antithrombotics/antiplatelets: Secondary | ICD-10-CM | POA: Diagnosis not present

## 2017-03-16 DIAGNOSIS — Z972 Presence of dental prosthetic device (complete) (partial): Secondary | ICD-10-CM | POA: Diagnosis not present

## 2017-03-16 DIAGNOSIS — H259 Unspecified age-related cataract: Secondary | ICD-10-CM | POA: Diagnosis not present

## 2017-03-16 DIAGNOSIS — H9113 Presbycusis, bilateral: Secondary | ICD-10-CM | POA: Diagnosis not present

## 2017-03-16 DIAGNOSIS — E663 Overweight: Secondary | ICD-10-CM | POA: Diagnosis not present

## 2017-03-16 DIAGNOSIS — I480 Paroxysmal atrial fibrillation: Secondary | ICD-10-CM | POA: Diagnosis not present

## 2017-03-16 DIAGNOSIS — N184 Chronic kidney disease, stage 4 (severe): Secondary | ICD-10-CM | POA: Diagnosis not present

## 2017-03-16 DIAGNOSIS — E119 Type 2 diabetes mellitus without complications: Secondary | ICD-10-CM | POA: Diagnosis not present

## 2017-03-16 DIAGNOSIS — I259 Chronic ischemic heart disease, unspecified: Secondary | ICD-10-CM | POA: Diagnosis not present

## 2017-03-16 DIAGNOSIS — R0602 Shortness of breath: Secondary | ICD-10-CM | POA: Diagnosis not present

## 2017-03-16 DIAGNOSIS — Z950 Presence of cardiac pacemaker: Secondary | ICD-10-CM | POA: Diagnosis not present

## 2017-03-16 DIAGNOSIS — Z955 Presence of coronary angioplasty implant and graft: Secondary | ICD-10-CM | POA: Diagnosis not present

## 2017-03-16 DIAGNOSIS — I4892 Unspecified atrial flutter: Secondary | ICD-10-CM | POA: Diagnosis not present

## 2017-03-16 DIAGNOSIS — Z0000 Encounter for general adult medical examination without abnormal findings: Secondary | ICD-10-CM | POA: Diagnosis not present

## 2017-03-21 ENCOUNTER — Encounter: Payer: Self-pay | Admitting: Cardiology

## 2017-04-03 LAB — CUP PACEART REMOTE DEVICE CHECK
Brady Statistic AP VP Percent: 38.2 %
Brady Statistic AP VS Percent: 49.8 %
Brady Statistic AS VP Percent: 4.7 %
Brady Statistic AS VS Percent: 8.4 %
Date Time Interrogation Session: 20180730092112
Implantable Lead Implant Date: 20110531
Implantable Lead Location: 753860
Implantable Lead Model: 5076
Implantable Lead Serial Number: 677341
Lead Channel Impedance Value: 459 Ohm
Lead Channel Pacing Threshold Amplitude: 0.75 V
MDC IDC LEAD IMPLANT DT: 20160622
MDC IDC LEAD LOCATION: 753859
MDC IDC MSMT LEADCHNL RA PACING THRESHOLD PULSEWIDTH: 0.4 ms
MDC IDC MSMT LEADCHNL RV IMPEDANCE VALUE: 391 Ohm
MDC IDC MSMT LEADCHNL RV PACING THRESHOLD AMPLITUDE: 0.875 V
MDC IDC MSMT LEADCHNL RV PACING THRESHOLD PULSEWIDTH: 0.4 ms
MDC IDC MSMT LEADCHNL RV SENSING INTR AMPL: 5.6 mV
MDC IDC PG IMPLANT DT: 20160622

## 2017-04-07 ENCOUNTER — Other Ambulatory Visit: Payer: Self-pay | Admitting: Internal Medicine

## 2017-05-25 DIAGNOSIS — R69 Illness, unspecified: Secondary | ICD-10-CM | POA: Diagnosis not present

## 2017-06-04 ENCOUNTER — Other Ambulatory Visit: Payer: Self-pay | Admitting: Internal Medicine

## 2017-06-05 ENCOUNTER — Encounter: Payer: Self-pay | Admitting: *Deleted

## 2017-06-12 ENCOUNTER — Ambulatory Visit (INDEPENDENT_AMBULATORY_CARE_PROVIDER_SITE_OTHER): Payer: Medicare HMO | Admitting: Internal Medicine

## 2017-06-12 ENCOUNTER — Encounter: Payer: Self-pay | Admitting: Internal Medicine

## 2017-06-12 VITALS — BP 130/78 | HR 79 | Ht 67.0 in | Wt 179.6 lb

## 2017-06-12 DIAGNOSIS — I480 Paroxysmal atrial fibrillation: Secondary | ICD-10-CM

## 2017-06-12 DIAGNOSIS — I2510 Atherosclerotic heart disease of native coronary artery without angina pectoris: Secondary | ICD-10-CM

## 2017-06-12 DIAGNOSIS — I495 Sick sinus syndrome: Secondary | ICD-10-CM | POA: Diagnosis not present

## 2017-06-12 LAB — COMPREHENSIVE METABOLIC PANEL
A/G RATIO: 1.5 (ref 1.2–2.2)
ALT: 13 IU/L (ref 0–44)
AST: 21 IU/L (ref 0–40)
Albumin: 4.1 g/dL (ref 3.5–4.7)
Alkaline Phosphatase: 70 IU/L (ref 39–117)
BILIRUBIN TOTAL: 0.5 mg/dL (ref 0.0–1.2)
BUN/Creatinine Ratio: 14 (ref 10–24)
BUN: 26 mg/dL (ref 8–27)
CHLORIDE: 102 mmol/L (ref 96–106)
CO2: 24 mmol/L (ref 20–29)
Calcium: 9.4 mg/dL (ref 8.6–10.2)
Creatinine, Ser: 1.86 mg/dL — ABNORMAL HIGH (ref 0.76–1.27)
GFR, EST AFRICAN AMERICAN: 37 mL/min/{1.73_m2} — AB (ref >59)
GFR, EST NON AFRICAN AMERICAN: 32 mL/min/{1.73_m2} — AB (ref >59)
GLOBULIN, TOTAL: 2.7 g/dL (ref 1.5–4.5)
Glucose: 149 mg/dL — ABNORMAL HIGH (ref 65–99)
POTASSIUM: 4.1 mmol/L (ref 3.5–5.2)
SODIUM: 140 mmol/L (ref 134–144)
TOTAL PROTEIN: 6.8 g/dL (ref 6.0–8.5)

## 2017-06-12 LAB — CUP PACEART INCLINIC DEVICE CHECK
Brady Statistic AP VP Percent: 49 %
Brady Statistic AP VS Percent: 39 %
Brady Statistic AS VS Percent: 7 %
Date Time Interrogation Session: 20181008133441
Implantable Lead Location: 753859
Implantable Pulse Generator Implant Date: 20160622
Lead Channel Impedance Value: 381 Ohm
Lead Channel Pacing Threshold Amplitude: 1 V
Lead Channel Pacing Threshold Pulse Width: 0.4 ms
Lead Channel Setting Pacing Amplitude: 2.5 V
Lead Channel Setting Pacing Pulse Width: 0.4 ms
Lead Channel Setting Sensing Sensitivity: 2 mV
MDC IDC LEAD IMPLANT DT: 20110531
MDC IDC LEAD IMPLANT DT: 20160622
MDC IDC LEAD LOCATION: 753860
MDC IDC LEAD SERIAL: 677341
MDC IDC MSMT BATTERY IMPEDANCE: 380 Ohm
MDC IDC MSMT BATTERY REMAINING LONGEVITY: 90 mo
MDC IDC MSMT BATTERY VOLTAGE: 2.79 V
MDC IDC MSMT LEADCHNL RA IMPEDANCE VALUE: 486 Ohm
MDC IDC MSMT LEADCHNL RA PACING THRESHOLD AMPLITUDE: 0.75 V
MDC IDC MSMT LEADCHNL RA PACING THRESHOLD AMPLITUDE: 0.75 V
MDC IDC MSMT LEADCHNL RA PACING THRESHOLD PULSEWIDTH: 0.4 ms
MDC IDC MSMT LEADCHNL RA PACING THRESHOLD PULSEWIDTH: 0.4 ms
MDC IDC MSMT LEADCHNL RA SENSING INTR AMPL: 2 mV
MDC IDC MSMT LEADCHNL RV PACING THRESHOLD AMPLITUDE: 0.875 V
MDC IDC MSMT LEADCHNL RV PACING THRESHOLD PULSEWIDTH: 0.4 ms
MDC IDC MSMT LEADCHNL RV SENSING INTR AMPL: 5.6 mV
MDC IDC SET LEADCHNL RA PACING AMPLITUDE: 2 V
MDC IDC STAT BRADY AS VP PERCENT: 5 %

## 2017-06-12 LAB — CBC WITH DIFFERENTIAL/PLATELET
BASOS: 0 %
Basophils Absolute: 0 10*3/uL (ref 0.0–0.2)
EOS (ABSOLUTE): 0.4 10*3/uL (ref 0.0–0.4)
Eos: 6 %
Hematocrit: 42.7 % (ref 37.5–51.0)
Hemoglobin: 14 g/dL (ref 13.0–17.7)
IMMATURE GRANULOCYTES: 0 %
Immature Grans (Abs): 0 10*3/uL (ref 0.0–0.1)
LYMPHS ABS: 1.3 10*3/uL (ref 0.7–3.1)
Lymphs: 19 %
MCH: 31.5 pg (ref 26.6–33.0)
MCHC: 32.8 g/dL (ref 31.5–35.7)
MCV: 96 fL (ref 79–97)
MONOS ABS: 0.6 10*3/uL (ref 0.1–0.9)
Monocytes: 8 %
NEUTROS PCT: 67 %
Neutrophils Absolute: 4.5 10*3/uL (ref 1.4–7.0)
PLATELETS: 175 10*3/uL (ref 150–379)
RBC: 4.44 x10E6/uL (ref 4.14–5.80)
RDW: 13.3 % (ref 12.3–15.4)
WBC: 6.9 10*3/uL (ref 3.4–10.8)

## 2017-06-12 LAB — T4: T4, Total: 11.3 ug/dL (ref 4.5–12.0)

## 2017-06-12 LAB — TSH: TSH: 3.57 u[IU]/mL (ref 0.450–4.500)

## 2017-06-12 NOTE — Progress Notes (Signed)
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  Since last being seen in our clinic, the patient reports doing very well.  Today, he denies symptoms of palpitations, chest pain, shortness of breath,  lower extremity edema, dizziness, presyncope, or syncope.  The patient is otherwise without complaint today.   Past Medical History:  Diagnosis Date  . BRADYCARDIA 08/04/2010   s/p PPM since 2011 - generator change 12/03/13  . Chronic kidney disease    followed by nephrology  . Coronary artery disease    remote subendo MI in February 2005 with stents to RCA &LCX; s/p scute thrombosis of both stents in February 2010 with cardiogenic shock - s/p BMS to both lesions; follow up cath in September 2010 was satisfactory.  He is medically managed with aspirin/Plavix  . GERD (gastroesophageal reflux disease)   . High risk medication use    on amiodarone for PAF/VT since February 2010  . HTN (hypertension)   . Hyperlipidemia   . Obesity   . Pacemaker   . PAF (paroxysmal atrial fibrillation) (HCC)    s/p cardioversion in August 2011  . Type II or unspecified type diabetes mellitus without mention of complication, uncontrolled    Past Surgical History:  Procedure Laterality Date  . EP IMPLANTABLE DEVICE N/A 02/25/2015   Procedure: Lead Revision/Repair;  Surgeon: [name], MD;  Location: [facility];  Service: Cardiovascular;  Laterality: N/A;  . PACEMAKER GENERATOR CHANGE  12/03/13   MDT Adapta L generator change for premature ERI by Dr Johney Frame  . PACEMAKER INSERTION  May 2011  . PERMANENT PACEMAKER GENERATOR CHANGE N/A 12/03/2013   Procedure: PERMANENT PACEMAKER GENERATOR CHANGE;  Surgeon: [name], MD;  Location: [facility] CATH [facility];  Service: Cardiovascular;  Laterality: N/A;    ROS- all systems are reviewed and negative except as per HPI above  Current Outpatient Prescriptions    Medication Sig Dispense Refill  . acetaminophen (TYLENOL) 500 MG tablet Take 500 mg by mouth every 6 (six) hours as needed. For pain     . amiodarone (PACERONE) 200 MG tablet TAKE 1/2 TABLET BY MOUTH EVERY DAY 120 tablet 0  . aspirin 81 MG tablet Take 81 mg by mouth daily.      . Cholecalciferol (VITAMIN D-3 PO) Take 1 tablet by mouth daily.    . clopidogrel (PLAVIX) 75 MG tablet TAKE 1 TABLET BY MOUTH EVERY DAY 30 tablet 7  . famotidine (PEPCID) 40 MG tablet TAKE 1 TABLET BY MOUTH EVERY DAY 30 tablet 6  . fenofibrate (TRICOR) 145 MG tablet Take 1 tablet (145 mg total) by mouth daily. 90 tablet 1  . Multiple Vitamin (MULTIVITAMIN) tablet Take 1 tablet by mouth daily.      . nitroGLYCERIN (NITROSTAT) 0.4 MG SL tablet Place 1 tablet (0.4 mg total) under the tongue every 5 (five) minutes as needed. For chest pain 25 tablet 5  . rosuvastatin (CRESTOR) 20 MG tablet TAKE 1 TABLET BY MOUTH EVERY DAY 90 tablet 3   No current facility-administered medications for this visit.     Physical Exam: Vitals:   06/12/17 1010  BP: 130/78  Pulse: 79  SpO2: (!) 78%  Weight: 179 lb 9.6 oz (81.5 kg)  Height:  (1.702 m)    GEN- The patient is well appearing, alert and oriented x 3 today.   Head- normocephalic, atraumatic Eyes-  Sclera clear, conjunctiva pink Ears- hearing intact Oropharynx- clear Lungs- Clear to ausculation bilaterally, normal work of breathing Chest- pacemaker pocket is well healed Heart- Regular rate and rhythm (V paced) GI- soft, NT, ND, + BS Extremities- no clubbing, cyanosis, or edema  Pacemaker interrogation- reviewed in detail today,  See PACEART report  ekg tracing ordered today is personally reviewed and shows V paced rhythm  Assessment and Plan:  1. Symptomatic sinus bradycardia  Normal pacemaker function See Pace Art report No changes today  2. afib Family feels too unsteady for [facility] therapy No afib in past year by [facility] interrogation.  On  daily  amiodarone.  Check lfts, tfts today Has preferred ASA and plavix previously despite my recommendation for OAC.  Cbc today Given advanced age and fragility, would not advise LAA closure  3. CAD No ischemic symptoms Continue plavix given prior taxus stent  4. Ischemic CM euvolemic No changes bmet today    carelink every 3 months Follow-up with Lawson Fiscal in 6 months I will see in a year  [name] MD, [facility] 06/12/2017 10:32 AM

## 2017-06-12 NOTE — Patient Instructions (Signed)
Medication Instructions:  Your physician recommends that you continue on your current medications as directed. Please refer to the Current Medication list given to you today.   Labwork: Your physician recommends that you return for lab work today (CBC, TSH/T4, CMET)   Testing/Procedures: None ordered   Follow-Up: Remote monitoring is used to monitor your Pacemaker  from home. This monitoring reduces the number of office visits required to check your device to one time per year. It allows [place] to keep an eye on the functioning of your device to ensure it is working properly. You are scheduled for a device check from home on 06/14/17. You may send your transmission at any time that day. If you have a wireless device, the transmission will be sent automatically. After your physician reviews your transmission, you will receive a postcard with your next transmission date.  Your physician wants you to follow-up in: 12 months with [name]. You will receive a reminder letter in the mail two months in advance. If you don't receive a letter, please call our office to schedule the follow-up appointment.   Any Other Special Instructions Will Be Listed Below (If Applicable).     If you need a refill on your cardiac medications before your next appointment, please call your pharmacy.

## 2017-06-14 ENCOUNTER — Ambulatory Visit (INDEPENDENT_AMBULATORY_CARE_PROVIDER_SITE_OTHER): Payer: Medicare HMO | Admitting: *Deleted

## 2017-06-14 DIAGNOSIS — I495 Sick sinus syndrome: Secondary | ICD-10-CM

## 2017-06-14 DIAGNOSIS — I480 Paroxysmal atrial fibrillation: Secondary | ICD-10-CM | POA: Diagnosis not present

## 2017-06-14 NOTE — Progress Notes (Signed)
Remote pacemaker transmission.   

## 2017-06-15 ENCOUNTER — Telehealth: Payer: Self-pay | Admitting: *Deleted

## 2017-06-15 NOTE — Telephone Encounter (Signed)
PT  WIFE (DPR)  IS AWARE OF RESULTS

## 2017-06-15 NOTE — Telephone Encounter (Signed)
-----   Message from [name], MD sent at 06/14/2017 10:09 AM EDT ----- Results reviewed.  Tresa Endo, please inform pt of result. I will route to primary care also.

## 2017-06-16 ENCOUNTER — Encounter: Payer: Self-pay | Admitting: Cardiology

## 2017-06-20 LAB — CUP PACEART REMOTE DEVICE CHECK
Brady Statistic AP VS Percent: 3 %
Brady Statistic AS VP Percent: 5 %
Brady Statistic AS VS Percent: 0 %
Implantable Lead Implant Date: 20160622
Implantable Lead Location: 753860
Implantable Lead Model: 4470
Implantable Lead Serial Number: 677341
Lead Channel Impedance Value: 382 Ohm
Lead Channel Pacing Threshold Amplitude: 0.75 V
Lead Channel Pacing Threshold Amplitude: 0.875 V
Lead Channel Pacing Threshold Pulse Width: 0.4 ms
Lead Channel Pacing Threshold Pulse Width: 0.4 ms
Lead Channel Setting Pacing Amplitude: 2 V
MDC IDC LEAD IMPLANT DT: 20110531
MDC IDC LEAD LOCATION: 753859
MDC IDC MSMT BATTERY IMPEDANCE: 405 Ohm
MDC IDC MSMT BATTERY REMAINING LONGEVITY: 81 mo
MDC IDC MSMT BATTERY VOLTAGE: 2.78 V
MDC IDC MSMT LEADCHNL RA IMPEDANCE VALUE: 472 Ohm
MDC IDC PG IMPLANT DT: 20160622
MDC IDC SESS DTM: 20181010131531
MDC IDC SET LEADCHNL RV PACING AMPLITUDE: 2.5 V
MDC IDC SET LEADCHNL RV PACING PULSEWIDTH: 0.4 ms
MDC IDC SET LEADCHNL RV SENSING SENSITIVITY: 2 mV
MDC IDC STAT BRADY AP VP PERCENT: 92 %

## 2017-06-22 ENCOUNTER — Other Ambulatory Visit: Payer: Self-pay | Admitting: Internal Medicine

## 2017-07-06 ENCOUNTER — Other Ambulatory Visit: Payer: Self-pay | Admitting: Internal Medicine

## 2017-07-06 MED ORDER — FAMOTIDINE 40 MG PO TABS: 40.0000 mg | ORAL_TABLET | Freq: Every day | ORAL | 11 refills | Status: DC

## 2017-08-17 ENCOUNTER — Other Ambulatory Visit: Payer: Self-pay | Admitting: Internal Medicine

## 2017-09-06 DIAGNOSIS — N189 Chronic kidney disease, unspecified: Secondary | ICD-10-CM | POA: Diagnosis not present

## 2017-09-06 DIAGNOSIS — N183 Chronic kidney disease, stage 3 (moderate): Secondary | ICD-10-CM | POA: Diagnosis not present

## 2017-09-06 DIAGNOSIS — N2581 Secondary hyperparathyroidism of renal origin: Secondary | ICD-10-CM | POA: Diagnosis not present

## 2017-09-12 DIAGNOSIS — N2581 Secondary hyperparathyroidism of renal origin: Secondary | ICD-10-CM | POA: Diagnosis not present

## 2017-09-12 DIAGNOSIS — N183 Chronic kidney disease, stage 3 (moderate): Secondary | ICD-10-CM | POA: Diagnosis not present

## 2017-09-12 DIAGNOSIS — I129 Hypertensive chronic kidney disease with stage 1 through stage 4 chronic kidney disease, or unspecified chronic kidney disease: Secondary | ICD-10-CM | POA: Diagnosis not present

## 2017-09-12 DIAGNOSIS — D631 Anemia in chronic kidney disease: Secondary | ICD-10-CM | POA: Diagnosis not present

## 2017-09-13 ENCOUNTER — Telehealth: Payer: Self-pay | Admitting: Cardiology

## 2017-09-13 ENCOUNTER — Ambulatory Visit (INDEPENDENT_AMBULATORY_CARE_PROVIDER_SITE_OTHER): Payer: Medicare HMO | Admitting: *Deleted

## 2017-09-13 DIAGNOSIS — I495 Sick sinus syndrome: Secondary | ICD-10-CM | POA: Diagnosis not present

## 2017-09-13 NOTE — Telephone Encounter (Signed)
Confirmed remote transmission w/ pt wife.   

## 2017-09-14 ENCOUNTER — Encounter: Payer: Self-pay | Admitting: Cardiology

## 2017-09-14 NOTE — Progress Notes (Signed)
Remote pacemaker transmission.   

## 2017-09-18 LAB — CUP PACEART REMOTE DEVICE CHECK
Implantable Lead Implant Date: 20160622
Implantable Lead Location: 753859
Implantable Lead Location: 753860
Implantable Lead Model: 4470
Implantable Pulse Generator Implant Date: 20160622
Lead Channel Setting Pacing Amplitude: 2 V
Lead Channel Setting Pacing Amplitude: 2.5 V
Lead Channel Setting Pacing Pulse Width: 0.4 ms
Lead Channel Setting Sensing Sensitivity: 2 mV
MDC IDC LEAD IMPLANT DT: 20110531
MDC IDC LEAD SERIAL: 677341
MDC IDC SESS DTM: 20190114164844

## 2017-09-28 ENCOUNTER — Other Ambulatory Visit: Payer: Self-pay | Admitting: Internal Medicine

## 2017-10-23 ENCOUNTER — Inpatient Hospital Stay (HOSPITAL_COMMUNITY): Payer: Medicare HMO

## 2017-10-23 ENCOUNTER — Emergency Department (HOSPITAL_COMMUNITY): Payer: Medicare HMO

## 2017-10-23 ENCOUNTER — Emergency Department (HOSPITAL_COMMUNITY): Payer: Medicare HMO | Admitting: Certified Registered Nurse Anesthetist

## 2017-10-23 ENCOUNTER — Inpatient Hospital Stay (HOSPITAL_COMMUNITY)
Admission: EM | Admit: 2017-10-23 | Discharge: 2017-10-28 | DRG: 024 | Disposition: A | Discharge status: Home Health | Payer: Medicare HMO | Attending: Neurology | Admitting: Neurology

## 2017-10-23 ENCOUNTER — Encounter (HOSPITAL_COMMUNITY)
Admission: EM | Disposition: A | Discharge status: Home Health | Payer: Self-pay | Source: Home / Self Care | Attending: Neurology

## 2017-10-23 DIAGNOSIS — E785 Hyperlipidemia, unspecified: Secondary | ICD-10-CM | POA: Diagnosis present

## 2017-10-23 DIAGNOSIS — J9811 Atelectasis: Secondary | ICD-10-CM | POA: Diagnosis not present

## 2017-10-23 DIAGNOSIS — G8929 Other chronic pain: Secondary | ICD-10-CM | POA: Diagnosis present

## 2017-10-23 DIAGNOSIS — R471 Dysarthria and anarthria: Secondary | ICD-10-CM | POA: Diagnosis present

## 2017-10-23 DIAGNOSIS — Z955 Presence of coronary angioplasty implant and graft: Secondary | ICD-10-CM

## 2017-10-23 DIAGNOSIS — G20 Parkinson's disease: Secondary | ICD-10-CM | POA: Diagnosis present

## 2017-10-23 DIAGNOSIS — I509 Heart failure, unspecified: Secondary | ICD-10-CM | POA: Diagnosis present

## 2017-10-23 DIAGNOSIS — I161 Hypertensive emergency: Secondary | ICD-10-CM | POA: Diagnosis present

## 2017-10-23 DIAGNOSIS — I69398 Other sequelae of cerebral infarction: Secondary | ICD-10-CM

## 2017-10-23 DIAGNOSIS — E1122 Type 2 diabetes mellitus with diabetic chronic kidney disease: Secondary | ICD-10-CM | POA: Diagnosis present

## 2017-10-23 DIAGNOSIS — I482 Chronic atrial fibrillation: Secondary | ICD-10-CM | POA: Diagnosis not present

## 2017-10-23 DIAGNOSIS — G8194 Hemiplegia, unspecified affecting left nondominant side: Secondary | ICD-10-CM | POA: Diagnosis present

## 2017-10-23 DIAGNOSIS — I6523 Occlusion and stenosis of bilateral carotid arteries: Secondary | ICD-10-CM | POA: Diagnosis not present

## 2017-10-23 DIAGNOSIS — R29810 Facial weakness: Secondary | ICD-10-CM | POA: Diagnosis present

## 2017-10-23 DIAGNOSIS — I6789 Other cerebrovascular disease: Secondary | ICD-10-CM | POA: Diagnosis not present

## 2017-10-23 DIAGNOSIS — Z7902 Long term (current) use of antithrombotics/antiplatelets: Secondary | ICD-10-CM

## 2017-10-23 DIAGNOSIS — I480 Paroxysmal atrial fibrillation: Secondary | ICD-10-CM | POA: Diagnosis not present

## 2017-10-23 DIAGNOSIS — D696 Thrombocytopenia, unspecified: Secondary | ICD-10-CM | POA: Diagnosis not present

## 2017-10-23 DIAGNOSIS — I6601 Occlusion and stenosis of right middle cerebral artery: Secondary | ICD-10-CM | POA: Diagnosis present

## 2017-10-23 DIAGNOSIS — R1310 Dysphagia, unspecified: Secondary | ICD-10-CM | POA: Diagnosis present

## 2017-10-23 DIAGNOSIS — Z87891 Personal history of nicotine dependence: Secondary | ICD-10-CM | POA: Diagnosis not present

## 2017-10-23 DIAGNOSIS — I69319 Unspecified symptoms and signs involving cognitive functions following cerebral infarction: Secondary | ICD-10-CM | POA: Diagnosis not present

## 2017-10-23 DIAGNOSIS — K219 Gastro-esophageal reflux disease without esophagitis: Secondary | ICD-10-CM | POA: Diagnosis present

## 2017-10-23 DIAGNOSIS — I639 Cerebral infarction, unspecified: Secondary | ICD-10-CM | POA: Diagnosis present

## 2017-10-23 DIAGNOSIS — I672 Cerebral atherosclerosis: Secondary | ICD-10-CM | POA: Diagnosis not present

## 2017-10-23 DIAGNOSIS — I252 Old myocardial infarction: Secondary | ICD-10-CM

## 2017-10-23 DIAGNOSIS — Z950 Presence of cardiac pacemaker: Secondary | ICD-10-CM | POA: Diagnosis not present

## 2017-10-23 DIAGNOSIS — I63411 Cerebral infarction due to embolism of right middle cerebral artery: Secondary | ICD-10-CM | POA: Diagnosis not present

## 2017-10-23 DIAGNOSIS — I10 Essential (primary) hypertension: Secondary | ICD-10-CM | POA: Diagnosis not present

## 2017-10-23 DIAGNOSIS — D631 Anemia in chronic kidney disease: Secondary | ICD-10-CM | POA: Diagnosis not present

## 2017-10-23 DIAGNOSIS — I2510 Atherosclerotic heart disease of native coronary artery without angina pectoris: Secondary | ICD-10-CM | POA: Diagnosis not present

## 2017-10-23 DIAGNOSIS — I361 Nonrheumatic tricuspid (valve) insufficiency: Secondary | ICD-10-CM | POA: Diagnosis not present

## 2017-10-23 DIAGNOSIS — R269 Unspecified abnormalities of gait and mobility: Secondary | ICD-10-CM | POA: Diagnosis not present

## 2017-10-23 DIAGNOSIS — E1165 Type 2 diabetes mellitus with hyperglycemia: Secondary | ICD-10-CM

## 2017-10-23 DIAGNOSIS — Z9889 Other specified postprocedural states: Secondary | ICD-10-CM | POA: Diagnosis not present

## 2017-10-23 DIAGNOSIS — Z7982 Long term (current) use of aspirin: Secondary | ICD-10-CM

## 2017-10-23 DIAGNOSIS — R29702 NIHSS score 2: Secondary | ICD-10-CM | POA: Diagnosis not present

## 2017-10-23 DIAGNOSIS — I340 Nonrheumatic mitral (valve) insufficiency: Secondary | ICD-10-CM | POA: Diagnosis not present

## 2017-10-23 DIAGNOSIS — I130 Hypertensive heart and chronic kidney disease with heart failure and stage 1 through stage 4 chronic kidney disease, or unspecified chronic kidney disease: Secondary | ICD-10-CM | POA: Diagnosis not present

## 2017-10-23 DIAGNOSIS — N183 Chronic kidney disease, stage 3 (moderate): Secondary | ICD-10-CM | POA: Diagnosis present

## 2017-10-23 DIAGNOSIS — R531 Weakness: Secondary | ICD-10-CM | POA: Diagnosis not present

## 2017-10-23 DIAGNOSIS — Z79899 Other long term (current) drug therapy: Secondary | ICD-10-CM

## 2017-10-23 DIAGNOSIS — R29818 Other symptoms and signs involving the nervous system: Secondary | ICD-10-CM | POA: Diagnosis not present

## 2017-10-23 DIAGNOSIS — I25119 Atherosclerotic heart disease of native coronary artery with unspecified angina pectoris: Secondary | ICD-10-CM | POA: Diagnosis not present

## 2017-10-23 HISTORY — PX: IR CT HEAD LTD: IMG2386

## 2017-10-23 HISTORY — PX: IR PERCUTANEOUS ART THROMBECTOMY/INFUSION INTRACRANIAL INC DIAG ANGIO: IMG6087

## 2017-10-23 HISTORY — PX: RADIOLOGY WITH ANESTHESIA: SHX6223

## 2017-10-23 LAB — DIFFERENTIAL
BASOS ABS: 0 10*3/uL (ref 0.0–0.1)
BASOS PCT: 1 %
Eosinophils Absolute: 0.3 10*3/uL (ref 0.0–0.7)
Eosinophils Relative: 7 %
LYMPHS ABS: 1.2 10*3/uL (ref 0.7–4.0)
LYMPHS PCT: 27 %
MONOS PCT: 6 %
Monocytes Absolute: 0.3 10*3/uL (ref 0.1–1.0)
NEUTROS ABS: 2.6 10*3/uL (ref 1.7–7.7)
Neutrophils Relative %: 59 %

## 2017-10-23 LAB — CBC
HEMATOCRIT: 42.2 % (ref 39.0–52.0)
HEMOGLOBIN: 13.7 g/dL (ref 13.0–17.0)
MCH: 31.8 pg (ref 26.0–34.0)
MCHC: 32.5 g/dL (ref 30.0–36.0)
MCV: 97.9 fL (ref 78.0–100.0)
Platelets: 147 10*3/uL — ABNORMAL LOW (ref 150–400)
RBC: 4.31 MIL/uL (ref 4.22–5.81)
RDW: 13.8 % (ref 11.5–15.5)
WBC: 4.4 10*3/uL (ref 4.0–10.5)

## 2017-10-23 LAB — COMPREHENSIVE METABOLIC PANEL
ALT: 16 U/L — AB (ref 17–63)
AST: 28 U/L (ref 15–41)
Albumin: 3.4 g/dL — ABNORMAL LOW (ref 3.5–5.0)
Alkaline Phosphatase: 69 U/L (ref 38–126)
Anion gap: 10 (ref 5–15)
BUN: 26 mg/dL — AB (ref 6–20)
CHLORIDE: 104 mmol/L (ref 101–111)
CO2: 22 mmol/L (ref 22–32)
CREATININE: 1.88 mg/dL — AB (ref 0.61–1.24)
Calcium: 9 mg/dL (ref 8.9–10.3)
GFR calc Af Amer: 36 mL/min — ABNORMAL LOW (ref >60)
GFR, EST NON AFRICAN AMERICAN: 31 mL/min — AB (ref >60)
Glucose, Bld: 266 mg/dL — ABNORMAL HIGH (ref 65–99)
Potassium: 3.8 mmol/L (ref 3.5–5.1)
Sodium: 136 mmol/L (ref 135–145)
Total Bilirubin: 0.7 mg/dL (ref 0.3–1.2)
Total Protein: 6.2 g/dL — ABNORMAL LOW (ref 6.5–8.1)

## 2017-10-23 LAB — APTT: APTT: 27 s (ref 24–36)

## 2017-10-23 LAB — I-STAT CHEM 8, ED
BUN: 28 mg/dL — ABNORMAL HIGH (ref 6–20)
CREATININE: 1.7 mg/dL — AB (ref 0.61–1.24)
Calcium, Ion: 1.15 mmol/L (ref 1.15–1.40)
Chloride: 102 mmol/L (ref 101–111)
GLUCOSE: 256 mg/dL — AB (ref 65–99)
HCT: 42 % (ref 39.0–52.0)
Hemoglobin: 14.3 g/dL (ref 13.0–17.0)
POTASSIUM: 3.8 mmol/L (ref 3.5–5.1)
Sodium: 139 mmol/L (ref 135–145)
TCO2: 25 mmol/L (ref 22–32)

## 2017-10-23 LAB — I-STAT TROPONIN, ED: TROPONIN I, POC: 0.02 ng/mL (ref 0.00–0.08)

## 2017-10-23 LAB — PROTIME-INR
INR: 1.04
Prothrombin Time: 13.5 seconds (ref 11.4–15.2)

## 2017-10-23 SURGERY — RADIOLOGY WITH ANESTHESIA
Anesthesia: General

## 2017-10-23 MED ORDER — SUGAMMADEX SODIUM 200 MG/2ML IV SOLN
INTRAVENOUS | Status: DC | PRN
  Administered 2017-10-23: 200 mg via INTRAVENOUS

## 2017-10-23 MED ORDER — ONDANSETRON HCL 4 MG/2ML IJ SOLN: 4.0000 mg | Freq: Once | INTRAMUSCULAR | Status: DC | PRN

## 2017-10-23 MED ORDER — NICARDIPINE HCL IN NACL 20-0.86 MG/200ML-% IV SOLN
INTRAVENOUS | Status: AC
  Filled 2017-10-23: qty 200

## 2017-10-23 MED ORDER — SODIUM CHLORIDE 0.9 % IV SOLN
INTRAVENOUS | Status: DC
  Administered 2017-10-23 – 2017-10-24 (×2): via INTRAVENOUS

## 2017-10-23 MED ORDER — SODIUM CHLORIDE 0.9 % IV SOLN
INTRAVENOUS | Status: DC
  Administered 2017-10-25: 15:00:00 via INTRAVENOUS

## 2017-10-23 MED ORDER — DEXTROSE 5 % IV SOLN
INTRAVENOUS | Status: DC | PRN
  Administered 2017-10-23: 50 ug/min via INTRAVENOUS

## 2017-10-23 MED ORDER — FENTANYL CITRATE (PF) 100 MCG/2ML IJ SOLN
INTRAMUSCULAR | Status: DC | PRN
  Administered 2017-10-23: 100 ug via INTRAVENOUS
  Administered 2017-10-23 (×2): 50 ug via INTRAVENOUS

## 2017-10-23 MED ORDER — HEPARIN SODIUM (PORCINE) 5000 UNIT/ML IJ SOLN
5000.0000 [IU] | Freq: Three times a day (TID) | INTRAMUSCULAR | Status: DC
  Administered 2017-10-23 – 2017-10-24 (×2): 5000 [IU] via SUBCUTANEOUS
  Filled 2017-10-23 (×2): qty 1

## 2017-10-23 MED ORDER — VANCOMYCIN HCL 1000 MG IV SOLR
INTRAVENOUS | Status: DC | PRN
  Administered 2017-10-23: 1000 mg via INTRAVENOUS

## 2017-10-23 MED ORDER — MEPERIDINE HCL 25 MG/ML IJ SOLN: 6.2500 mg | INTRAMUSCULAR | Status: DC | PRN

## 2017-10-23 MED ORDER — ORAL CARE MOUTH RINSE
15.0000 mL | Freq: Two times a day (BID) | OROMUCOSAL | Status: DC
  Administered 2017-10-23 – 2017-10-27 (×7): 15 mL via OROMUCOSAL

## 2017-10-23 MED ORDER — STROKE: EARLY STAGES OF RECOVERY BOOK
Freq: Once | Status: DC
  Filled 2017-10-23: qty 1

## 2017-10-23 MED ORDER — FENTANYL CITRATE (PF) 100 MCG/2ML IJ SOLN
INTRAMUSCULAR | Status: AC
  Filled 2017-10-23: qty 4

## 2017-10-23 MED ORDER — ACETAMINOPHEN 650 MG RE SUPP: 650.0000 mg | RECTAL | Status: DC | PRN

## 2017-10-23 MED ORDER — IOPAMIDOL (ISOVUE-370) INJECTION 76%
INTRAVENOUS | Status: AC
  Filled 2017-10-23: qty 50

## 2017-10-23 MED ORDER — SODIUM CHLORIDE 0.9 % IJ SOLN
INTRAVENOUS | Status: DC | PRN
  Administered 2017-10-23 (×2): 25 ug via INTRA_ARTERIAL

## 2017-10-23 MED ORDER — EPTIFIBATIDE 20 MG/10ML IV SOLN
INTRAVENOUS | Status: AC
  Filled 2017-10-23: qty 10

## 2017-10-23 MED ORDER — ACETAMINOPHEN 160 MG/5ML PO SOLN: 650.0000 mg | ORAL | Status: DC | PRN

## 2017-10-23 MED ORDER — DEXAMETHASONE SODIUM PHOSPHATE 10 MG/ML IJ SOLN
INTRAMUSCULAR | Status: DC | PRN
  Administered 2017-10-23: 5 mg via INTRAVENOUS

## 2017-10-23 MED ORDER — ACETAMINOPHEN 325 MG PO TABS
650.0000 mg | ORAL_TABLET | ORAL | Status: DC | PRN
  Administered 2017-10-25: 650 mg via ORAL
  Filled 2017-10-23: qty 2

## 2017-10-23 MED ORDER — SENNOSIDES-DOCUSATE SODIUM 8.6-50 MG PO TABS: 1.0000 | ORAL_TABLET | Freq: Every evening | ORAL | Status: DC | PRN

## 2017-10-23 MED ORDER — NICARDIPINE HCL IN NACL 20-0.86 MG/200ML-% IV SOLN
0.0000 mg/h | INTRAVENOUS | Status: DC
  Administered 2017-10-23: 5 mg/h via INTRAVENOUS

## 2017-10-23 MED ORDER — LACTATED RINGERS IV SOLN
INTRAVENOUS | Status: DC | PRN
Start: 2017-10-23 — End: 2017-10-23
  Administered 2017-10-23: 17:00:00 via INTRAVENOUS

## 2017-10-23 MED ORDER — HYDROMORPHONE HCL 1 MG/ML IJ SOLN: 0.2500 mg | INTRAMUSCULAR | Status: DC | PRN

## 2017-10-23 MED ORDER — EPTIFIBATIDE 20 MG/10ML IV SOLN
INTRAVENOUS | Status: DC | PRN
  Administered 2017-10-23: 3 mg via INTRAVENOUS
  Administered 2017-10-23: 1.5 mg via INTRAVENOUS

## 2017-10-23 MED ORDER — PROPOFOL 10 MG/ML IV BOLUS
INTRAVENOUS | Status: DC | PRN
  Administered 2017-10-23: 120 mg via INTRAVENOUS

## 2017-10-23 MED ORDER — ROCURONIUM BROMIDE 100 MG/10ML IV SOLN
INTRAVENOUS | Status: DC | PRN
  Administered 2017-10-23: 50 mg via INTRAVENOUS

## 2017-10-23 MED ORDER — NITROGLYCERIN 1 MG/10 ML FOR IR/CATH LAB
INTRA_ARTERIAL | Status: AC
  Filled 2017-10-23: qty 10

## 2017-10-23 MED ORDER — SUCCINYLCHOLINE CHLORIDE 20 MG/ML IJ SOLN
INTRAMUSCULAR | Status: DC | PRN
  Administered 2017-10-23: 140 mg via INTRAVENOUS

## 2017-10-23 MED ORDER — IOPAMIDOL (ISOVUE-300) INJECTION 61%
INTRAVENOUS | Status: AC
  Administered 2017-10-23: 120 mL
  Filled 2017-10-23: qty 300

## 2017-10-23 MED ORDER — VANCOMYCIN HCL IN DEXTROSE 1-5 GM/200ML-% IV SOLN
INTRAVENOUS | Status: AC
  Filled 2017-10-23: qty 200

## 2017-10-23 MED ORDER — ACETAMINOPHEN 325 MG PO TABS: 650.0000 mg | ORAL_TABLET | ORAL | Status: DC | PRN

## 2017-10-23 MED ORDER — ONDANSETRON HCL 4 MG/2ML IJ SOLN
INTRAMUSCULAR | Status: DC | PRN
  Administered 2017-10-23: 4 mg via INTRAVENOUS

## 2017-10-23 NOTE — Sedation Documentation (Signed)
Sheath to right groin D/C'd. V-Pad applied.

## 2017-10-23 NOTE — Anesthesia Procedure Notes (Signed)
Arterial Line Insertion Start/End2/18/2019 5:20 PM Performed by: Jeani HawkingBerry, Tabatha J, CRNA, CRNA  Emergency situation Left, radial was placed Catheter size: 20 G Hand hygiene performed  and maximum sterile barriers used  Allen's test indicative of satisfactory collateral circulation Attempts: 1 Procedure performed without using ultrasound guided technique. Following insertion, dressing applied and Biopatch. Post procedure assessment: normal  Patient tolerated the procedure well with no immediate complications.

## 2017-10-23 NOTE — ED Provider Notes (Signed)
[facility] EMERGENCY DEPARTMENT Provider Note   CSN: [id number] Arrival date & time: 10/23/17  1615     History   Chief Complaint No chief complaint on file.   HPI DORION PETILLO is a 84 y.o. male.  HPI Patient with history of proximal atrial fibrillation not on anticoagulation.  Last seen normal was at 1510 today.  Per wife had fall at home.  Was unable to get up.  EMS was called.  Noted to have left sided weakness which gradually improved en route.  Presents as code stroke.  Noted to have mild left-sided facial droop and dysarthric speech.  Symptoms worse with standing.  Denies any other symptoms. Past Medical History:  Diagnosis Date  . BRADYCARDIA 08/04/2010   s/p PPM since 2011 - generator change 12/03/13  . Chronic kidney disease    followed by nephrology  . Coronary artery disease    remote subendo MI in February 2005 with stents to RCA &LCX; s/p scute thrombosis of both stents in February 2010 with cardiogenic shock - s/p BMS to both lesions; follow up cath in September 2010 was satisfactory.  He is medically managed with aspirin/Plavix  . GERD (gastroesophageal reflux disease)   . High risk medication use    on amiodarone for PAF/VT since February 2010  . HTN (hypertension)   . Hyperlipidemia   . Obesity   . Pacemaker   . PAF (paroxysmal atrial fibrillation) (HCC)    s/p cardioversion in August 2011  . Type II or unspecified type diabetes mellitus without mention of complication, uncontrolled     Patient Active Problem List   Diagnosis Date Noted  . 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 Lower back pain 03/30/2014  . Encounter for preventative adult health care exam with abnormal findings 11/26/2013  . Diabetes (HCC) 11/26/2013  . Coronary artery disease   . PAF (paroxysmal atrial fibrillation) (HCC)   . GERD (gastroesophageal reflux disease)   . Hyperlipidemia   . Chronic kidney disease   . Pacemaker   . Essential hypertension, benign  08/04/2010  . Sick sinus syndrome (HCC) 08/04/2010    Past Surgical History:  Procedure Laterality Date  . EP IMPLANTABLE DEVICE N/A 02/25/2015   Procedure: Lead Revision/Repair;  Surgeon: [name], MD;  Location: [facility] INVASIVE CV LAB;  Service: Cardiovascular;  Laterality: N/A;  . PACEMAKER GENERATOR CHANGE  12/03/13   MDT Adapta L generator change for premature ERI by Dr Johney Frame  . PACEMAKER INSERTION  May 2011  . PERMANENT PACEMAKER GENERATOR CHANGE N/A 12/03/2013   Procedure: PERMANENT PACEMAKER GENERATOR CHANGE;  Surgeon: [name], MD;  Location: [facility] CATH LAB;  Service: Cardiovascular;  Laterality: N/A;       Home Medications    Prior to Admission medications   Medication Sig Start Date End Date Taking? Authorizing Provider  acetaminophen (TYLENOL) 500 MG tablet Take 500 mg by mouth every 6 (six) hours as needed. For pain     [provider]  amiodarone (PACERONE) 200 MG tablet TAKE 1/2 TABLET BY MOUTH EVERY DAY 09/28/17   Allred, Fayrene Fearing, MD  aspirin 81 MG tablet Take 81 mg by mouth daily.      [provider]  Cholecalciferol (VITAMIN D-3 PO) Take 1 tablet by mouth daily.    [provider]  clopidogrel (PLAVIX) 75 MG tablet TAKE 1 TABLET BY MOUTH EVERY DAY 06/23/17   Gypsy Balsam K, NP  famotidine (PEPCID) 40 MG tablet Take 1 tablet (40 mg total)  by mouth daily. 07/06/17   Allred, Fayrene FearingJames, MD  fenofibrate (TRICOR) 145 MG tablet Take 1 tablet (145 mg total) by mouth daily. 06/06/17   Allred, Fayrene FearingJames, MD  Multiple Vitamin (MULTIVITAMIN) tablet Take 1 tablet by mouth daily.      [provider]  nitroGLYCERIN (NITROSTAT) 0.4 MG SL tablet Place 1 tablet (0.4 mg total) under the tongue every 5 (five) minutes as needed. For chest pain 02/27/15   Allred, Fayrene FearingJames, MD  rosuvastatin (CRESTOR) 20 MG tablet TAKE 1 TABLET BY MOUTH EVERY DAY 08/17/17   Hillis RangeAllred, James, MD    Family History Family History  Problem Relation Age of Onset  . Heart disease Mother     . Stomach cancer Father   . Diabetes Father     Social History Social History   Tobacco Use  . Smoking status: Former Smoker    Packs/day: 3.00    Types: Cigarettes  . Smokeless tobacco: Never Used  . Tobacco comment[place]  Substance Use Topics  . Alcohol use: No  . Drug use: No     Allergies   Penicillins and Sulfonamide derivatives   Review of Systems Review of Systems  Constitutional: Negative for chills and fever.  Eyes: Negative for visual disturbance.  Respiratory: Negative for shortness of breath.   Cardiovascular: Negative for chest pain.  Gastrointestinal: Negative for abdominal pain, nausea and vomiting.  Musculoskeletal: Positive for gait problem. Negative for neck pain.  Skin: Negative for rash and wound.  Neurological: Positive for facial asymmetry, speech difficulty and weakness. Negative for syncope and numbness.  All other systems reviewed and are negative.    Physical Exam Updated Vital Signs There were no vitals taken for this visit.  Physical Exam  Constitutional: He is oriented to person, place, and time. He appears well-developed and well-nourished. No distress.  HENT:  Head: Normocephalic and atraumatic.  Mouth/Throat: Oropharynx is clear and moist.  No obvious head injury.  Eyes: EOM are normal. Pupils are equal, round, and reactive to light.  Neck: Normal range of motion. Neck supple.  No posterior midline cervical tenderness to palpation.  Cardiovascular: Normal rate and regular rhythm. Exam reveals no gallop and no friction rub.  No murmur heard. Pulmonary/Chest: Effort normal and breath sounds normal.  Abdominal: Soft. Bowel sounds are normal. There is no tenderness. There is no rebound and no guarding.  Musculoskeletal: Normal range of motion. He exhibits no edema or tenderness.  Distal pulses are 2+.  No lower extremity swelling or pain.  Neurological: He is alert and oriented to person, place, and time.  Mild left  lower facial droop.  Mild dysarthria.  5/5 motor in all extremities.  Sensation grossly intact.  Skin: Skin is warm and dry. No rash noted. No erythema.  Psychiatric: He has a normal mood and affect. His behavior is normal.  Nursing note and vitals reviewed.    ED Treatments / Results  Labs (all labs ordered are listed, but only abnormal results are displayed) Labs Reviewed  CBC - Abnormal; Notable for the following components:      Result Value   Platelets 147 (*)    All other components within normal limits  I-STAT CHEM 8, ED - Abnormal; Notable for the following components:   BUN 28 (*)    Creatinine, Ser 1.70 (*)    Glucose, Bld 256 (*)    All other components within normal limits  DIFFERENTIAL  PROTIME-INR  APTT  COMPREHENSIVE METABOLIC PANEL  I-STAT  TROPONIN, ED  CBG MONITORING, ED    EKG  EKG Interpretation  Date/Time:  Monday October 23 2017 16:52:48 EST Ventricular Rate:  66 PR Interval:    QRS Duration: 187 QT Interval:  476 QTC Calculation: 499 R Axis:   -59 Text Interpretation:  Sinus rhythm Short PR interval Nonspecific IVCD with LAD LVH with secondary repolarization abnormality Probable anterolateral infarct, recent Confirmed by Loren Racer ([id number]) on 10/23/2017 5:27:01 PM       Radiology Ct Head Code Stroke Wo Contrast  Result Date: 10/23/2017 CLINICAL DATA:  Code stroke. Left facial droop and weakness developed within the last 90 minutes. EXAM: CT HEAD WITHOUT CONTRAST TECHNIQUE: Contiguous axial images were obtained from the base of the skull through the vertex without intravenous contrast. COMPARISON:  None. FINDINGS: Brain: Generalized brain atrophy. Chronic small-vessel ischemic changes of the cerebral hemispheric white matter. No sign of acute infarction, mass lesion, hemorrhage, hydrocephalus or extra-axial collection. Vascular: There is atherosclerotic calcification of the major vessels at the base of the brain. Skull: Negative Sinuses/Orbits:  Clear/normal Other: None ASPECTS (Alberta Stroke Program Early CT Score) - Ganglionic level infarction (caudate, lentiform nuclei, internal capsule, insula, M1-M3 cortex): 7 - Supraganglionic infarction (M4-M6 cortex): 3 Total score (0-10 with 10 being normal): 10 IMPRESSION: 1. No acute finding. Atrophy and chronic small-vessel ischemic changes of the cerebral hemispheric white matter. 2. ASPECTS is 10. 3. These results were communicated to at 4:34 pmon 2/18/2019by text page via the [facility] messaging system. Electronically Signed   By: Paulina Fusi M.D.   On: 10/23/2017 16:34    Procedures Procedures (including critical care time)  Medications Ordered in ED Medications  iopamidol (ISOVUE-370) 76 % injection (not administered)     Initial Impression / Assessment and Plan / ED Course  I have reviewed the triage vital signs and the nursing notes.  Pertinent labs & imaging results that were available during my care of the patient were reviewed by me and considered in my medical decision making (see chart for details).     Given low presenting NIHSS score TPA was not given.  CT angio with right M1 occlusion.  Patient was taken to IR suite.   Final Clinical Impressions(s) / ED Diagnoses   Final diagnoses:  Acute embolic stroke [facility])    ED Discharge Orders    None       Loren Racer, MD 10/23/17 1727

## 2017-10-23 NOTE — Anesthesia Procedure Notes (Signed)
Procedure Name: Intubation Date/Time: 10/23/2017 5:17 PM Performed by: White, Amedeo Plenty, CRNA Pre-anesthesia Checklist: Patient identified, Emergency Drugs available, Suction available and Patient being monitored Patient Re-evaluated:Patient Re-evaluated prior to induction Oxygen Delivery Method: Circle System Utilized Preoxygenation: Pre-oxygenation with 100% oxygen Induction Type: IV induction, Rapid sequence and Cricoid Pressure applied Laryngoscope Size: Mac and 4 Grade View: Grade I Tube type: Oral Tube size: 7.5 mm Number of attempts: 1 Airway Equipment and Method: Stylet Placement Confirmation: ETT inserted through vocal cords under direct vision,  positive ETCO2 and breath sounds checked- equal and bilateral Secured at: 23 cm Tube secured with: Tape Dental Injury: Teeth and Oropharynx as per pre-operative assessment

## 2017-10-23 NOTE — Progress Notes (Signed)
Patient ID: Kent GammaWilliam P Nolan, male   DOB: 05/11/1929, 84 y.o.   MRN: [id number] INR 84 yr old rt h male mRS 1 to 2  LSW  3 10 pm . new onset  symptoms of dysarthria and Lt sided weakness.  CT Brain No ICH ASPECTS 10  CTA  RT MCA M1 occlusion. Findings discussed with spouse and daughter. Option of endovascular revascularization  To prevent further neuro injury D/W spouse and daughter. Reasons ,risks,alternatives al reviewed. Risks of iCH of 10 % ,worseing neuro funcrion ,vent dependency death and inability tor revascularize all discussed .Questions answered to their satisfaction. Informed witnessed consent obtained for endovascular revascularization . S.Camri Molloy MD .

## 2017-10-23 NOTE — Code Documentation (Signed)
84 year old male presents to [facility] as code stroke via [facility].  Wife reports patient was sitting in chair watching TV - stood up to go to bathroom she heard a fall - found him on floor called 911.  EMS reports on arrival patient was alert - lying on floor - no apparent injury - no neuro deficits.  They stood him and he became dysarthric with left side weakness and drooling.  They immediately laid him flat and symptoms once again cleared.  On arrival to ED he was alert - oriented - followed commands - no focal weakness noted - slightl left facial droop and mild dysarthria.  Met at bridge by stroke team - taken to CT scan.  CTA reveals distal  right MCA clot. NIHSS 2 - facial droop -dysarthria. See stroke log for times.    Family here - back to ED.  Dr. Rory Percy speaking with family - MRS 2 - dysarthria worsening - Dr. Rory Percy aware.   Dr. Rory Percy spoke with Dr. Estanislado Pandy - family/patient agree to IR.  Patient taken to IR suite - foley placed - groin shaved - anesthesia present - handoff to Judson Roch RN.

## 2017-10-23 NOTE — H&P (Signed)
Neurology H&P  CC: Left-sided weakness  History is obtained from: EMS, chart  HPI: Kent Nolan is a 84 y.o. male past medical history of paroxysmal atrial fibrillation not on anticoagulation currently on dual antiplatelets, bradycardia status post pacemaker 2011, chronic kidney disease, hypertension, coronary artery disease, hyperlipidemia, diabetes, prior best report by EMS last seen normal at 3:10 PM today 10/23/2017, was noted by the wife to have witnessed a fall as he was trying to get out of the chair and go to use the bathroom. EMS was called.  Upon EMS initial assessment, there was some left-sided weakness that started to improve.  They made him walk which presumably worsened his symptoms.  En route, he started becoming better with only some dysarthria and left facial droop, which I also found on my exam. No preceding sickness or illness.  No chest pain shortness of breath.  No nausea vomiting.  No visual symptoms.  No headaches. No history of hitting his head on the floor during the fall. LKW: 3:10 PM on 10/23/2017 tpa given?: no, low NIH. Premorbid modified Rankin scale (mRS): 2    ROS: Review of systems performed with pertinent positives documented in the HPI.  Rest negative.    Past Medical History:  Diagnosis Date  . BRADYCARDIA 08/04/2010   s/p PPM since 2011 - generator change 12/03/13  . Chronic kidney disease    followed by nephrology  . Coronary artery disease    remote subendo MI in February 2005 with stents to RCA &LCX; s/p scute thrombosis of both stents in February 2010 with cardiogenic shock - s/p BMS to both lesions; follow up cath in September 2010 was satisfactory.  He is medically managed with aspirin/Plavix  . GERD (gastroesophageal reflux disease)   . High risk medication use    on amiodarone for PAF/VT since February 2010  . HTN (hypertension)   . Hyperlipidemia   . Obesity   . Pacemaker   . PAF (paroxysmal atrial fibrillation) (HCC)    s/p  cardioversion in August 2011  . Type II or unspecified type diabetes mellitus without mention of complication, uncontrolled     Family History  Problem Relation Age of Onset  . Heart disease Mother   . Stomach cancer Father   . Diabetes Father     Social History:   reports that he has quit smoking. His smoking use included cigarettes. He smoked 3.00 packs per day. he has never used smokeless tobacco. He reports that he does not drink alcohol or use drugs.  Medications  Current Facility-Administered Medications:  .   stroke: mapping our early stages of recovery book, , Does not apply, Once, Milon Dikes, MD .  0.9 %  sodium chloride infusion, , Intravenous, Continuous, Milon Dikes, MD .  acetaminophen (TYLENOL) tablet 650 mg, 650 mg, Oral, Q4H PRN **OR** acetaminophen (TYLENOL) solution 650 mg, 650 mg, Per Tube, Q4H PRN **OR** acetaminophen (TYLENOL) suppository 650 mg, 650 mg, Rectal, Q4H PRN, Milon Dikes, MD .  fentaNYL (SUBLIMAZE) 100 MCG/2ML injection, , , ,  .  heparin injection 5,000 Units, 5,000 Units, Subcutaneous, Q8H, Milon Dikes, MD .  iopamidol (ISOVUE-300) 61 % injection, , , ,  .  iopamidol (ISOVUE-370) 76 % injection, , , ,  .  senna-docusate (Senokot-S) tablet 1 tablet, 1 tablet, Oral, QHS PRN, Milon Dikes, MD  Current Outpatient Medications:  .  acetaminophen (TYLENOL) 500 MG tablet, Take 500 mg by mouth every 6 (six) hours as needed. For pain , Disp: ,  Rfl:  .  amiodarone (PACERONE) 200 MG tablet, TAKE 1/2 TABLET BY MOUTH EVERY DAY, Disp: 15 tablet, Rfl: 9 .  aspirin 81 MG tablet, Take 81 mg by mouth daily.  , Disp: , Rfl:  .  Cholecalciferol (VITAMIN D-3 PO), Take 1 tablet by mouth daily., Disp: , Rfl:  .  clopidogrel (PLAVIX) 75 MG tablet, TAKE 1 TABLET BY MOUTH EVERY DAY, Disp: 30 tablet, Rfl: 11 .  famotidine (PEPCID) 40 MG tablet, Take 1 tablet (40 mg total) by mouth daily., Disp: 30 tablet, Rfl: 11 .  fenofibrate (TRICOR) 145 MG tablet, Take 1 tablet  (145 mg total) by mouth daily., Disp: 90 tablet, Rfl: 1 .  Multiple Vitamin (MULTIVITAMIN) tablet, Take 1 tablet by mouth daily.  , Disp: , Rfl:  .  nitroGLYCERIN (NITROSTAT) 0.4 MG SL tablet, Place 1 tablet (0.4 mg total) under the tongue every 5 (five) minutes as needed. For chest pain, Disp: 25 tablet, Rfl: 5 .  rosuvastatin (CRESTOR) 20 MG tablet, TAKE 1 TABLET BY MOUTH EVERY DAY, Disp: 90 tablet, Rfl: 3   Exam: Current vital signs: There were no vitals taken for this visit. Vital signs in last 24 hours:   Systolic blood pressure 160 Heart rate 65 general: Patient is awake alert in no distress HEENT: Normal cephalic atraumatic Dry oral mucous membranes clear nares and clear throat Lungs clear to auscultation Cardiovascular irregularly irregular, S1-S2 heard Abdomen: Nondistended nontender Extremities: No pedal edema Neurological exam Patient is awake alert oriented x3. His speech is mildly dysarthric.  On repeat exam his speech became severely dysarthric. Naming, attention and repetition intact. Cranial nerves: Pupils equal round reactive to light, extra ocular movements intact, visual fields full, mild left lower facial weakness, facial sensation intact bilaterally, auditory acuity grossly reduced even with hearing aids in place, palate elevates symmetrically, shoulder shrug intact, tongue midline. Motor exam: No drift 5/5 bilateral lower extremities and no drift 5/5 bilateral upper extremities initially.  Per report, as he starts to walk and gets up, he becomes completely weak on the left side.  I did notice as he was being transported from the CT and his head was up, that he appeared weak on the left arm. Sensory exam: No decreased sensation.  No extinction. Coordination: Intact finger-nose-finger and heel-knee-shin bilaterally. DTRs: Absent in both ankles, trace DTRs in both knees.  Trace DTRs in both biceps and triceps. Gait testing was deferred at this time Initial NIH on  arrival at the ER bridge-2-1 for dysarthria 1 for facial. Repeat NIH exam probably worse- 2 for dysarthria, may be 1 for the left arm, and 1 left face.  Labs I have reviewed labs in epic and the results pertinent to this consultation are:   CBC    Component Value Date/Time   WBC 4.4 10/23/2017 1618   RBC 4.31 10/23/2017 1618   HGB 14.3 10/23/2017 1631   HGB 14.0 06/12/2017 1051   HCT 42.0 10/23/2017 1631   HCT 42.7 06/12/2017 1051   PLT 147 (L) 10/23/2017 1618   PLT 175 06/12/2017 1051   MCV 97.9 10/23/2017 1618   MCV 96 06/12/2017 1051   MCH 31.8 10/23/2017 1618   MCHC 32.5 10/23/2017 1618   RDW 13.8 10/23/2017 1618   RDW 13.3 06/12/2017 1051   LYMPHSABS 1.2 10/23/2017 1618   LYMPHSABS 1.3 06/12/2017 1051   MONOABS 0.3 10/23/2017 1618   EOSABS 0.3 10/23/2017 1618   EOSABS 0.4 06/12/2017 1051   BASOSABS 0.0 10/23/2017 1618  BASOSABS 0.0 06/12/2017 1051    CMP     Component Value Date/Time   NA 139 10/23/2017 1631   NA 140 06/12/2017 1051   K 3.8 10/23/2017 1631   CL 102 10/23/2017 1631   CO2 24 06/12/2017 1051   GLUCOSE 256 (H) 10/23/2017 1631   BUN 28 (H) 10/23/2017 1631   BUN 26 06/12/2017 1051   CREATININE 1.70 (H) 10/23/2017 1631   CREATININE 2.35 (H) 12/02/2015 1058   CALCIUM 9.4 06/12/2017 1051   PROT 6.8 06/12/2017 1051   ALBUMIN 4.1 06/12/2017 1051   AST 21 06/12/2017 1051   ALT 13 06/12/2017 1051   ALKPHOS 70 06/12/2017 1051   BILITOT 0.5 06/12/2017 1051   GFRNONAA 32 (L) 06/12/2017 1051   GFRAA 37 (L) 06/12/2017 1051   Imaging I have reviewed the images obtained:  CT-scan of the brain showed a possible dense right MCA.  Aspects 10. I went ahead and obtained a CT angiogram of the head and neck that showed a right M1 occlusion with some collateralization.   Assessment 84 year old man with paroxysmal atrial fibrillation not on anticoagulation, presented for evaluation of left-sided weakness that started around 3:10 PM on 10/23/2017. Upon initial  evaluation in the emergency room, he had a low NIH stroke scale of 2.  For this reason, TPA was not initiated. Motor history was provided which led me to believe that he probably has a carotid a right MCA stenosis or clot because his symptoms get worse when he walks. I obtained a CT angiogram of the head and neck that showed a acute right M1 occlusion. His neurological exam also worsened a little bit with worsening dysarthria and may be some more left upper extremity weakness. Given this constellation of findings that localize to the [facility] territory and are very hemodynamically dependent (possibly still has good collaterals inspite of the RMCA clot), I did not initiate TPA but I did contact the endovascular team for a possible intervention because of this clot being proximal and amenable to intervention along with a good ASPECT score on his CT. Patient will be going in for an endovascular thrombectomy. Following this he will be admitted to the neurological ICU for further care.  Impression Cerebral infarction due to embolism of right middle cerebral artery Mechanism likely cardioembolic in the setting of paroxysmal atrial fibrillation not on anticoagulation  Recommendations/plan: Acuity: Acute Admit to neurological ICU -Continue Aspirin/ Plavix and Statin. Will need anticoag long term. TimingTBD after MRI. -Blood pressure control, goal of SYS <220. If successful EVT, goal <140. -MRI/ECHO/A1C/Lipid panel. -Hyperglycemia management per SSI to maintain glucose 140-180mg /dL. -PT/OT/ST therapies and recommendations when able  CNS -Close neuro monitoring  Dysarthria Dysphagia following cerebral infarction  -NPO until cleared by speech -ST  Hemiplegia and hemiparesis following cerebral infarction affecting left non-dominant side  -PT/OT -PM&R consult   RESP Intubated for procedure -vent management per ICU -wean when able   CV Essential (primary) hypertension Hypertensive  Emergency -Aggressive BP control as above -cardene and labetalol PRN  Heart failure, unspecified-TTE -Continue BB  Hyperlipidemia, unspecified  - Statin for goal LDL < 70  Paroxysmal atrial fibrillation -Rate control - will need anticoag in long term as discussed above.  HEME No active issues -Monitor -transfuse for hgb < 7  ENDO Type 2 diabetes mellitus with hyperglycemia  -SSI -Start oral meds -goal HgbA1c < 7  GI/GU CKD Stage 3 (GFR 30-59) -Gentle hydration -avoid nephrotoxic agents  Fluid/Electrolyte Disorders -Replete -Repeat labs  ID Possible Aspiration  PNA -CXR -NPO -Monitor   Prophylaxis DVT: sqh, SCD   GI: PPI per ICU protocol Bowel: Doc/senna  Diet: NPO until cleared by speech  Code Status: Full Code  THE FOLLOWING WERE PRESENT ON ADMISSION: CNS -  Acute Ischemic Stroke, Hemiparesis Respiratory - Probable Aspiration Pneumonia Cardiovascular - essential HTN Renal -  CKD3  -- Milon Dikes, MD [facility] Pager: [phone] If 7pm to 7am, please call on call as listed on AMION.   CRITICAL CARE ATTESTATION This patient is critically ill and at significant risk of neurological worsening, death and care requires constant monitoring of vital signs, hemodynamics,respiratory and cardiac monitoring. I spent 60  minutes of neurocritical care time performing neurological assessment, discussion with family, other specialists and medical decision making of high complexityin the care of  this patient.

## 2017-10-23 NOTE — Transfer of Care (Signed)
Immediate Anesthesia Transfer of Care Note  Patient: Kent Nolan  Procedure(s) Performed: RADIOLOGY WITH ANESTHESIA (N/A )  Patient Location: PACU  Anesthesia Type:General  Level of Consciousness: sedated, drowsy, patient cooperative and responds to stimulation  Airway & Oxygen Therapy: Patient Spontanous Breathing and Patient connected to face mask oxygen  Post-op Assessment: Report given to RN, Post -op Vital signs reviewed and stable and Patient moving all extremities X 4  Post vital signs: Reviewed and stable  Last Vitals: There were no vitals filed for this visit.  Last Pain: There were no vitals filed for this visit.       Complications: No apparent anesthesia complications

## 2017-10-23 NOTE — Anesthesia Postprocedure Evaluation (Signed)
Anesthesia Post Note  Patient: Kent GammaWilliam P Nolan  Procedure(s) Performed: RADIOLOGY WITH ANESTHESIA (N/A )     Patient location during evaluation: PACU Anesthesia Type: General Level of consciousness: awake and alert Pain management: pain level controlled Vital Signs Assessment: post-procedure vital signs reviewed and stable Respiratory status: spontaneous breathing, nonlabored ventilation, respiratory function stable and patient connected to nasal cannula oxygen Cardiovascular status: blood pressure returned to baseline and stable Postop Assessment: no apparent nausea or vomiting Anesthetic complications: no    Last Vitals:  Vitals:   10/23/17 1945 10/23/17 2000  BP: (!) 145/73 (!) 143/71  Pulse: 60 61  Resp: 18 18  Temp:    SpO2: 99% 100%    Last Pain: There were no vitals filed for this visit.               Kewana Sanon DAVID

## 2017-10-23 NOTE — Anesthesia Preprocedure Evaluation (Signed)
Anesthesia Evaluation  Patient identified by MRN, date of birth, ID band Patient confused    Reviewed: Allergy & Precautions, NPO status , Patient's Chart, lab work & pertinent test results  Airway Mallampati: I  TM Distance: >3 FB Neck ROM: Full    Dental   Pulmonary former smoker,    Pulmonary exam normal        Cardiovascular hypertension, Pt. on medications + CAD and + Cardiac Stents  Normal cardiovascular exam+ dysrhythmias Atrial Fibrillation + pacemaker      Neuro/Psych CVA    GI/Hepatic   Endo/Other  diabetes, Type 2, Oral Hypoglycemic Agents  Renal/GU Renal InsufficiencyRenal disease     Musculoskeletal   Abdominal   Peds  Hematology   Anesthesia Other Findings   Reproductive/Obstetrics                             Anesthesia Physical Anesthesia Plan  ASA: III and emergent  Anesthesia Plan: General   Post-op Pain Management:    Induction:   PONV Risk Score and Plan: 2 and Treatment may vary due to age or medical condition  Airway Management Planned: Oral ETT  Additional Equipment: Arterial line  Intra-op Plan:   Post-operative Plan: Possible Post-op intubation/ventilation  Informed Consent: I have reviewed the patients History and Physical, chart, labs and discussed the procedure including the risks, benefits and alternatives for the proposed anesthesia with the patient or authorized representative who has indicated his/her understanding and acceptance.     Plan Discussed with: CRNA and Surgeon  Anesthesia Plan Comments:         Anesthesia Quick Evaluation

## 2017-10-23 NOTE — Procedures (Signed)
S/P Rt common carotid arteriogram . Findings. 1.Occluded RT IMCA M1 occlusion. S/P complete revascularization  With x 2 passes with solitaire 4mm x 40 mm retrieval device achieving a TICI 3 reperfusion. Also used 4.5 mg of IA integrelin.

## 2017-10-24 ENCOUNTER — Inpatient Hospital Stay (HOSPITAL_COMMUNITY): Payer: Medicare HMO

## 2017-10-24 ENCOUNTER — Other Ambulatory Visit (HOSPITAL_COMMUNITY): Payer: Medicare HMO

## 2017-10-24 ENCOUNTER — Encounter (HOSPITAL_COMMUNITY): Payer: Self-pay | Admitting: Interventional Radiology

## 2017-10-24 LAB — CBC WITH DIFFERENTIAL/PLATELET
Basophils Absolute: 0 10*3/uL (ref 0.0–0.1)
Basophils Relative: 0 %
EOS ABS: 0 10*3/uL (ref 0.0–0.7)
EOS PCT: 0 %
HCT: 36.9 % — ABNORMAL LOW (ref 39.0–52.0)
Hemoglobin: 12.2 g/dL — ABNORMAL LOW (ref 13.0–17.0)
LYMPHS ABS: 0.4 10*3/uL — AB (ref 0.7–4.0)
LYMPHS PCT: 5 %
MCH: 31.7 pg (ref 26.0–34.0)
MCHC: 33.1 g/dL (ref 30.0–36.0)
MCV: 95.8 fL (ref 78.0–100.0)
MONO ABS: 0.2 10*3/uL (ref 0.1–1.0)
Monocytes Relative: 3 %
Neutro Abs: 8.3 10*3/uL — ABNORMAL HIGH (ref 1.7–7.7)
Neutrophils Relative %: 92 %
PLATELETS: 140 10*3/uL — AB (ref 150–400)
RBC: 3.85 MIL/uL — ABNORMAL LOW (ref 4.22–5.81)
RDW: 13.2 % (ref 11.5–15.5)
WBC: 9 10*3/uL (ref 4.0–10.5)

## 2017-10-24 LAB — BASIC METABOLIC PANEL
Anion gap: 9 (ref 5–15)
BUN: 23 mg/dL — AB (ref 6–20)
CALCIUM: 8.5 mg/dL — AB (ref 8.9–10.3)
CO2: 21 mmol/L — ABNORMAL LOW (ref 22–32)
CREATININE: 1.59 mg/dL — AB (ref 0.61–1.24)
Chloride: 106 mmol/L (ref 101–111)
GFR calc Af Amer: 44 mL/min — ABNORMAL LOW (ref >60)
GFR, EST NON AFRICAN AMERICAN: 38 mL/min — AB (ref >60)
GLUCOSE: 236 mg/dL — AB (ref 65–99)
Potassium: 4 mmol/L (ref 3.5–5.1)
Sodium: 136 mmol/L (ref 135–145)

## 2017-10-24 LAB — GLUCOSE, CAPILLARY
GLUCOSE-CAPILLARY: 187 mg/dL — AB (ref 65–99)
GLUCOSE-CAPILLARY: 223 mg/dL — AB (ref 65–99)
GLUCOSE-CAPILLARY: 171 mg/dL — AB (ref 65–99)
Glucose-Capillary: 223 mg/dL — ABNORMAL HIGH (ref 65–99)
Glucose-Capillary: 203 mg/dL — ABNORMAL HIGH (ref 65–99)

## 2017-10-24 LAB — LIPID PANEL
CHOL/HDL RATIO: 2.6 ratio
CHOLESTEROL: 77 mg/dL (ref 0–200)
HDL: 30 mg/dL — ABNORMAL LOW (ref >40)
LDL Cholesterol: 32 mg/dL (ref 0–99)
TRIGLYCERIDES: 74 mg/dL (ref <150)
VLDL: 15 mg/dL (ref 0–40)

## 2017-10-24 LAB — HEMOGLOBIN A1C
HEMOGLOBIN A1C: 7.8 % — AB (ref 4.8–5.6)
MEAN PLASMA GLUCOSE: 177.16 mg/dL

## 2017-10-24 LAB — MRSA PCR SCREENING: MRSA by PCR: NEGATIVE

## 2017-10-24 MED ORDER — AMIODARONE HCL 200 MG PO TABS
100.0000 mg | ORAL_TABLET | Freq: Every day | ORAL | Status: DC
  Administered 2017-10-24 – 2017-10-28 (×5): 100 mg via ORAL
  Filled 2017-10-24 (×5): qty 1

## 2017-10-24 MED ORDER — FENOFIBRATE 160 MG PO TABS
160.0000 mg | ORAL_TABLET | Freq: Every day | ORAL | Status: DC
  Administered 2017-10-24 – 2017-10-28 (×5): 160 mg via ORAL
  Filled 2017-10-24 (×5): qty 1

## 2017-10-24 MED ORDER — INSULIN ASPART 100 UNIT/ML ~~LOC~~ SOLN
0.0000 [IU] | Freq: Three times a day (TID) | SUBCUTANEOUS | Status: DC
  Administered 2017-10-24: 5 [IU] via SUBCUTANEOUS
  Administered 2017-10-25: 2 [IU] via SUBCUTANEOUS
  Administered 2017-10-25: 3 [IU] via SUBCUTANEOUS
  Administered 2017-10-26: 2 [IU] via SUBCUTANEOUS
  Administered 2017-10-26: 3 [IU] via SUBCUTANEOUS
  Administered 2017-10-26 – 2017-10-28 (×4): 2 [IU] via SUBCUTANEOUS
  Administered 2017-10-28: 3 [IU] via SUBCUTANEOUS

## 2017-10-24 MED ORDER — INSULIN ASPART 100 UNIT/ML ~~LOC~~ SOLN
0.0000 [IU] | Freq: Every day | SUBCUTANEOUS | Status: DC
  Administered 2017-10-25: 2 [IU] via SUBCUTANEOUS

## 2017-10-24 MED ORDER — LABETALOL HCL 5 MG/ML IV SOLN: 5.0000 mg | INTRAVENOUS | Status: DC | PRN

## 2017-10-24 MED ORDER — ROSUVASTATIN CALCIUM 20 MG PO TABS
20.0000 mg | ORAL_TABLET | Freq: Every day | ORAL | Status: DC
  Administered 2017-10-24 – 2017-10-28 (×5): 20 mg via ORAL
  Filled 2017-10-24 (×7): qty 1

## 2017-10-24 NOTE — Evaluation (Signed)
Clinical/Bedside Swallow Evaluation Patient Details  Name: Jovita GammaWilliam P Soza MRN: [id number] Date of Birth: [birth year]  Today's Date: 10/24/2017 Time: SLP Start Time (ACUTE ONLY): 0854 SLP Stop Time (ACUTE ONLY): 0925 SLP Time Calculation (min) (ACUTE ONLY): 31 min  Past Medical History:  Past Medical History:  Diagnosis Date  . BRADYCARDIA 08/04/2010   s/p PPM since 2011 - generator change 12/03/13  . Chronic kidney disease    followed by nephrology  . Coronary artery disease    remote subendo MI in February 2005 with stents to RCA &LCX; s/p scute thrombosis of both stents in February 2010 with cardiogenic shock - s/p BMS to both lesions; follow up cath in September 2010 was satisfactory.  He is medically managed with aspirin/Plavix  . GERD (gastroesophageal reflux disease)   . High risk medication use    on amiodarone for PAF/VT since February 2010  . HTN (hypertension)   . Hyperlipidemia   . Obesity   . Pacemaker   . PAF (paroxysmal atrial fibrillation) (HCC)    s/p cardioversion in August 2011  . Type II or unspecified type diabetes mellitus without mention of complication, uncontrolled    Past Surgical History:  Past Surgical History:  Procedure Laterality Date  . EP IMPLANTABLE DEVICE N/A 02/25/2015   Procedure: Lead Revision/Repair;  Surgeon: Hillis RangeJames Allred, MD;  Location: [facility];  Service: Cardiovascular;  Laterality: N/A;  . PACEMAKER GENERATOR CHANGE  12/03/13   MDT Adapta L generator change for premature ERI by [name]  . PACEMAKER INSERTION  May 2011  . PERMANENT PACEMAKER GENERATOR CHANGE N/A 12/03/2013   Procedure: PERMANENT PACEMAKER GENERATOR CHANGE;  Surgeon: Gardiner RhymeJames D Allred, MD;  Location: [facility] CATH [facility];  Service: Cardiovascular;  Laterality: N/A;   HPI:  Pt is an 84 year old man who presents with left-sided weakness, found to have a R M1 occlusion s/p revascularization. PMH also includes afib, bradycardia s/p pacemaker, CKD, HTN, CAD, HLD, DM, GERD    Assessment / Plan / Recommendation Clinical Impression  Pt initially appeared to tolerate sips of water well, but had anterior spillage and lingual residue with solid trials that required Min cues for clearance. Following solid trials, pt started to have consistent, immediate coughing with thin liquids that could not be alleviate despite straw removal and cues for small sips. Coughing was reduced with cup sips of nectar thick liquids, although with one delayed instance. Discussed options with pt/family who are interested in pursuing MBS to better assess oropharyngeal function. Will plan for this morning. Would allow meds crushed in puree until then. SLP Visit Diagnosis: Dysphagia, oropharyngeal phase (R13.12)    Aspiration Risk  Moderate aspiration risk    Diet Recommendation NPO except meds   Medication Administration: Crushed with puree    Other  Recommendations Oral Care Recommendations: Oral care QID   Follow up Recommendations (tba)      Frequency and Duration            Prognosis Prognosis for Safe Diet Advancement: Good      Swallow Study   General HPI: Pt is an 84 year old man who presents with left-sided weakness, found to have a R M1 occlusion s/p revascularization. PMH also includes afib, bradycardia s/p pacemaker, CKD, HTN, CAD, HLD, DM, GERD Type of Study: Bedside Swallow Evaluation Previous Swallow Assessment: none in chart Diet Prior to this Study: NPO Temperature Spikes Noted: No Respiratory Status: Nasal cannula History of Recent Intubation: Yes(for procedure only) Behavior/Cognition: Alert;Cooperative;Pleasant mood Oral Cavity Assessment: Within Functional  Limits Oral Care Completed by SLP: No Oral Cavity - Dentition: Edentulous;Dentures, not available Vision: Functional for self-feeding Self-Feeding Abilities: Able to feed self;Needs set up Patient Positioning: Upright in bed Baseline Vocal Quality: Normal Volitional Cough: Strong Volitional Swallow:  Able to elicit    Oral/Motor/Sensory Function Overall Oral Motor/Sensory Function: Mild impairment Facial ROM: Reduced left;Suspected CN VII (facial) dysfunction Facial Symmetry: Abnormal symmetry left;Suspected CN VII (facial) dysfunction Facial Strength: Reduced left;Suspected CN VII (facial) dysfunction Lingual ROM: Within Functional Limits Lingual Symmetry: Within Functional Limits Velum: Within Functional Limits Mandible: Within Functional Limits   Ice Chips Ice chips: Not tested   Thin Liquid Thin Liquid: Impaired Presentation: Cup;Self Fed;Straw Pharyngeal  Phase Impairments: Cough - Immediate;Other (comments)(audible swallow)    Nectar Thick Nectar Thick Liquid: Impaired Presentation: Cup;Self Fed Pharyngeal Phase Impairments: Cough - Delayed(x1)   Honey Thick Honey Thick Liquid: Not tested   Puree Puree: Impaired Presentation: Self Fed;Spoon Oral Phase Impairments: Reduced labial seal Oral Phase Functional Implications: Left anterior spillage   Solid   GO   Solid: Impaired Presentation: Self Fed Oral Phase Functional Implications: Impaired mastication;Oral residue        Maxcine Ham 10/24/2017,10:04 AM  Maxcine Ham, M.A. CCC-SLP [phone]

## 2017-10-24 NOTE — Progress Notes (Signed)
Inpatient Diabetes Program Recommendations  AACE/ADA: New Consensus Statement on Inpatient Glycemic Control (2015)  Target Ranges:  Prepandial:   less than 140 mg/dL      Peak postprandial:   less than 180 mg/dL (1-2 hours)      Critically ill patients:  140 - 180 mg/dL   Lab Results  Component Value Date   GLUCAP 223 (H) 10/24/2017   HGBA1C 7.8 (H) 10/24/2017    Review of Glycemic ControlResults for Jovita GammaKLINGE, Drevion P (MRN [id number]) as of 10/24/2017 10:05  Ref. Range 10/23/2017 19:36 10/24/2017 08:26  Glucose-Capillary Latest Ref Range: 65 - 99 mg/dL 147187 (H) 829223 (H)    Diabetes history: DM Outpatient Diabetes medications: None Current orders for Inpatient glycemic control:  None noted Inpatient Diabetes Program Recommendations:   Please consider adding Novolog sensitive correction tid with meals and HS while in the hospital.   Thanks,  Beryl MeagerJenny Trase Bunda, RN, BC-ADM Inpatient Diabetes Coordinator Pager [phone] (8a-5p)

## 2017-10-24 NOTE — Plan of Care (Signed)
Patient had follow-up CT, Echo and barium swallow test today.  Started on Dys2/Carb Mod diet with no complications.  Cardene drip stopped at 0900 and pressures remain stable. Resuming home PO meds. Kent Nolan

## 2017-10-24 NOTE — Consult Note (Signed)
Physical Medicine and Rehabilitation Consult   Reason for Consult: Functional deficits Referring Physician: Dr. Pearlean Brownie    HPI: Kent Nolan is a 84 y.o. male with history of HTN, PAF, bradycardia s/p PPM who was admitted on 10/23/17 after fall with left sided weakness. He had some improvement enroute to hospital with left facial droop and mild dysarthria at admission. No tPA given as NIHSS 2 and he was underwent CTA showing acute M1 occlusion. He underwent cerebral angio with complete revascularization of right MCA occlusion with solitaire retrieval device and IA integrelin. Follow up CT head revealed small hyperdensity adjacent to right carotid terminus --question amount of blood related to procedure. Dr. Pearlean Brownie felt that stroke was cardioembolic in nature due to PAF. Patient also noted to have cogwheeling with termors on exam and question of undiagnosed Parkinson's disease. Follow up CT head done today showing punctate density right inferior insula and faint subcentimeter density right M1 segment. Therapy evaluations done yesterday and CIR recommended due to functional deficits.   She has chronic low back pain which has been aggravated since hospitalized, no history of surgeries.  Has used Tylenol at home for pain   Review of Systems  Constitutional: Negative for chills and fever.  HENT: Positive for hearing loss. Negative for tinnitus.   Eyes: Negative for blurred vision and double vision.  Cardiovascular: Positive for leg swelling. Negative for chest pain and palpitations.  Gastrointestinal: Negative for heartburn and nausea.  Genitourinary: Negative for dysuria.  Musculoskeletal: Positive for back pain, joint pain (hip/knee pain) and myalgias.  Skin: Negative for itching and rash.  Neurological: Positive for speech change and weakness. Negative for dizziness, sensory change and headaches.  Psychiatric/Behavioral: Negative for hallucinations. The patient is not nervous/anxious.        Past Medical History:  Diagnosis Date  . BRADYCARDIA 08/04/2010   s/p PPM since 2011 - generator change 12/03/13  . Chronic kidney disease    followed by nephrology  . Coronary artery disease    remote subendo MI in February 2005 with stents to RCA &LCX; s/p scute thrombosis of both stents in February 2010 with cardiogenic shock - s/p BMS to both lesions; follow up cath in September 2010 was satisfactory.  He is medically managed with aspirin/Plavix  . GERD (gastroesophageal reflux disease)   . High risk medication use    on amiodarone for PAF/VT since February 2010  . HTN (hypertension)   . Hyperlipidemia   . Obesity   . Pacemaker   . PAF (paroxysmal atrial fibrillation) (HCC)    s/p cardioversion in August 2011  . Type II or unspecified type diabetes mellitus without mention of complication, uncontrolled     Past Surgical History:  Procedure Laterality Date  . EP IMPLANTABLE DEVICE N/A 02/25/2015   Procedure: Lead Revision/Repair;  Surgeon: [name], MD;  Location: [facility];  Service: Cardiovascular;  Laterality: N/A;  . PACEMAKER GENERATOR CHANGE  12/03/13   MDT Adapta L generator change for premature ERI by Dr Johney Frame  . PACEMAKER INSERTION  May 2011  . PERMANENT PACEMAKER GENERATOR CHANGE N/A 12/03/2013   Procedure: PERMANENT PACEMAKER GENERATOR CHANGE;  Surgeon: [name], MD;  Location: [facility] CATH [facility];  Service: Cardiovascular;  Laterality: N/A;  . RADIOLOGY WITH ANESTHESIA N/A 10/23/2017   Procedure: RADIOLOGY WITH ANESTHESIA;  Surgeon: [name], MD;  Location: [facility] OR;  Service: Radiology;  Laterality: N/A;    Family History  Problem Relation Age of Onset  .  Heart disease Mother   . Stomach cancer Father   . Diabetes Father     Social History:  Married.  Independent PTA- has a cane and walker but does not use them consistently. Sedentary PTA--had difficulty getting out of bed and uses a lift chair at home. He reports that he has quit smoking.  His smoking use included cigarettes. He smoked 3.00 packs per day. he has never used smokeless tobacco. He reports that he does not drink alcohol or use drugs.    Allergies  Allergen Reactions  . Penicillins Other (See Comments)    Reaction unknown  . Sulfonamide Derivatives Other (See Comments)    Reaction unknown    Medications Prior to Admission  Medication Sig Dispense Refill  . amiodarone (PACERONE) 200 MG tablet TAKE 1/2 TABLET BY MOUTH EVERY DAY (Patient taking differently: TAKE 1/2 TABLET (100mg ) BY MOUTH EVERY DAY) 15 tablet 9  . clopidogrel (PLAVIX) 75 MG tablet TAKE 1 TABLET BY MOUTH EVERY DAY (Patient taking differently: TAKE 1 TABLET (75mg ) BY MOUTH EVERY DAY) 30 tablet 11  . famotidine (PEPCID) 40 MG tablet Take 1 tablet (40 mg total) by mouth daily. 30 tablet 11  . fenofibrate (TRICOR) 145 MG tablet Take 1 tablet (145 mg total) by mouth daily. 90 tablet 1  . nitroGLYCERIN (NITROSTAT) 0.4 MG SL tablet Place 1 tablet (0.4 mg total) under the tongue every 5 (five) minutes as needed. For chest pain 25 tablet 5  . rosuvastatin (CRESTOR) 20 MG tablet TAKE 1 TABLET BY MOUTH EVERY DAY (Patient taking differently: TAKE 1 TABLET (20mg ) BY MOUTH EVERY DAY) 90 tablet 3  . acetaminophen (TYLENOL) 500 MG tablet Take 500 mg by mouth every 6 (six) hours as needed. For pain     . aspirin 81 MG tablet Take 81 mg by mouth daily.      . Cholecalciferol (VITAMIN D-3 PO) Take 1 tablet by mouth daily.    . Multiple Vitamin (MULTIVITAMIN) tablet Take 1 tablet by mouth daily.        Home: Home Living Family/patient expects to be discharged to:: Private residence Living Arrangements: Spouse/significant other Available Help at Discharge: Family, Available 24 hours/day Type of Home: House Home Access: Stairs to enter [employer] of Steps: 1 at rear entry which is the entrance most used  Home Layout: One level Bathroom Shower/Tub: Health visitor: Handicapped  height Home Equipment: Environmental consultant - 2 wheels, Environmental consultant - 4 wheels, [place] - single point, Shower seat Additional Comments: Pt HOH and dysarthric making it a bit difficult to obtain info accurately.  No family  present during eval   Lives With: Spouse  Functional History: Prior Function Level of Independence: Independent Comments: Pt reports his wife assists him sometimes with putting on his suspenders, otherwise he indicates he is independent.  He reports he drives sometimes, but mostly wife does the driving.  He prefers tub baths.  He walks with RW when Low back flares up  Functional Status:  Mobility: Bed Mobility Overal bed mobility: Needs Assistance Bed Mobility: Sit to Supine Supine to sit: Mod assist General bed mobility comments: requires assist to bring LEs onto bed Transfers Overall transfer level: Modified independent Transfers: Sit to/from Stand, Stand Pivot Transfers Sit to Stand: Mod assist, +2 physical assistance, +2 safety/equipment Stand pivot transfers: Mod assist, +2 physical assistance, +2 safety/equipment General transfer comment: mod assist +2 to power up with posterior bias and right lean, increased time to initiate shuffle steps Ambulation/Gait General Gait Details:  unable to perform at this time    ADL: ADL Overall ADL's : Needs assistance/impaired Eating/Feeding: Set up, Sitting Grooming: Wash/dry hands, Wash/dry face, Oral care, Set up, Supervision/safety, Sitting Upper Body Bathing: Minimal assistance, Sitting Lower Body Bathing: Maximal assistance, Sit to/from stand Upper Body Dressing : Minimal assistance, Sitting Lower Body Dressing: Maximal assistance, Sit to/from stand Lower Body Dressing Details (indicate cue type and reason): Pt crosses ankles over knees in attempts to doff socks, but unable to maintain LEs crossed.   Toilet Transfer: Moderate assistance, +2 for physical assistance, Stand-pivot, BSC, RW Toileting- Clothing Manipulation and Hygiene:  Maximal assistance, Sit to/from stand Functional mobility during ADLs: Moderate assistance, +2 for physical assistance, Rolling walker General ADL Comments: Pt with posterior lean.  requires Assist with standing balance, and is unable to access feet   Cognition: Cognition Overall Cognitive Status: Impaired/Different from baseline Arousal/Alertness: Awake/alert Orientation Level: Oriented X4 Attention: Sustained Sustained Attention: Appears intact Awareness: Impaired Awareness Impairment: Intellectual impairment, Emergent impairment, Anticipatory impairment Problem Solving: Appears intact(basic, functional tasks) Cognition Arousal/Alertness: Awake/alert Behavior During Therapy: WFL for tasks assessed/performed Overall Cognitive Status: Impaired/Different from baseline Area of Impairment: Attention, Safety/judgement, Awareness, Problem solving Current Attention Level: Sustained Safety/Judgement: Decreased awareness of safety, Decreased awareness of deficits Awareness: Intellectual Problem Solving: Slow processing, Decreased initiation, Difficulty sequencing, Requires verbal cues, Requires tactile cues General Comments: patient with some decreased initiation during functional movement   Blood pressure 100/69, pulse 68, temperature 97.6 F (36.4 C), temperature source Oral, resp. rate 17, height 5\' 7"  (1.702 m), weight 83.5 kg (184 lb 1.4 oz), SpO2 96 %. Physical Exam  Nursing note and vitals reviewed. Constitutional: He is oriented to person, place, and time. He appears well-developed and well-nourished. No distress.  HENT:  Head: Normocephalic and atraumatic.  Mouth/Throat: Oropharynx is clear and moist.  Eyes: Conjunctivae and EOM are normal. Pupils are equal, round, and reactive to light. Right eye exhibits no discharge. Left eye exhibits no discharge.  Neck: Normal range of motion. Neck supple.  Cardiovascular: Normal rate and regular rhythm.  No murmur heard. Respiratory:  Effort normal and breath sounds normal. No stridor. No respiratory distress. He exhibits no tenderness.  GI: Soft. Bowel sounds are normal. He exhibits no distension. There is no tenderness.  Musculoskeletal: He exhibits edema.  BUE with 1+ edema, flaky skin and diffuse ecchymosis. 1+ pedal edema bilaterally.   Neurological: He is alert and oriented to person, place, and time.  HOH. Moderate dysarthria. Able to follow simple one step motor commands. Resting tremors BUE.   Skin: Skin is warm and dry. He is not diaphoretic.  Psychiatric: He has a normal mood and affect. His speech is slurred. He expresses inappropriate judgment.  Motor strength is 4/5 bilateral deltoid bicep tricep finger flexors extensors hip flexors knee extensors ankle dorsiflexor Sensation intact light touch bilateral upper and lower limbs. Masked facies, increased latency of response  Results for orders placed or performed during the hospital encounter of 10/23/17 (from the past 24 hour(s))  Glucose, capillary     Status: Abnormal   Collection Time: 10/24/17  8:26 AM  Result Value Ref Range   Glucose-Capillary 223 (H) 65 - 99 mg/dL   Comment 1 Notify RN    Comment 2 Document in Chart   Glucose, capillary     Status: Abnormal   Collection Time: 10/24/17 12:00 PM  Result Value Ref Range   Glucose-Capillary 203 (H) 65 - 99 mg/dL   Comment 1 Notify RN  Comment 2 Document in Chart   Glucose, capillary     Status: Abnormal   Collection Time: 10/24/17  4:23 PM  Result Value Ref Range   Glucose-Capillary 223 (H) 65 - 99 mg/dL   Comment 1 Notify RN    Comment 2 Document in Chart   Glucose, capillary     Status: Abnormal   Collection Time: 10/24/17 10:23 PM  Result Value Ref Range   Glucose-Capillary 171 (H) 65 - 99 mg/dL  CBC     Status: Abnormal   Collection Time: 10/25/17  4:10 AM  Result Value Ref Range   WBC 10.3 4.0 - 10.5 K/uL   RBC 3.63 (L) 4.22 - 5.81 MIL/uL   Hemoglobin 11.2 (L) 13.0 - 17.0 g/dL   HCT  78.2 (L) 95.6 - 52.0 %   MCV 96.4 78.0 - 100.0 fL   MCH 30.9 26.0 - 34.0 pg   MCHC 32.0 30.0 - 36.0 g/dL   RDW 21.3 08.6 - 57.8 %   Platelets 148 (L) 150 - 400 K/uL  Basic metabolic panel     Status: Abnormal   Collection Time: 10/25/17  4:10 AM  Result Value Ref Range   Sodium 139 135 - 145 mmol/L   Potassium 4.2 3.5 - 5.1 mmol/L   Chloride 106 101 - 111 mmol/L   CO2 22 22 - 32 mmol/L   Glucose, Bld 168 (H) 65 - 99 mg/dL   BUN 24 (H) 6 - 20 mg/dL   Creatinine, Ser 4.69 (H) 0.61 - 1.24 mg/dL   Calcium 8.7 (L) 8.9 - 10.3 mg/dL   GFR calc non Af Amer 35 (L) >60 mL/min   GFR calc Af Amer 40 (L) >60 mL/min   Anion gap 11 5 - 15  Magnesium     Status: None   Collection Time: 10/25/17  4:10 AM  Result Value Ref Range   Magnesium 1.9 1.7 - 2.4 mg/dL  Phosphorus     Status: None   Collection Time: 10/25/17  4:10 AM  Result Value Ref Range   Phosphorus 2.7 2.5 - 4.6 mg/dL   Ct Angio Head W Or Wo Contrast  Result Date: 10/23/2017 CLINICAL DATA:  LEFT-sided weakness and facial droop. Last seen normal at 15 20 hours. History of hypertension and hyperlipidemia, atrial fibrillation, diabetes. EXAM: CT ANGIOGRAPHY HEAD AND NECK TECHNIQUE: Multidetector CT imaging of the head and neck was performed using the standard protocol during bolus administration of intravenous contrast. Multiplanar CT image reconstructions and MIPs were obtained to evaluate the vascular anatomy. Carotid stenosis measurements (when applicable) are obtained utilizing NASCET criteria, using the distal internal carotid diameter as the denominator. CONTRAST:  50 cc Isovue 370 COMPARISON:  CT HEAD October 23, 2016 at 1629 hours FINDINGS: CTA NECK FINDINGS: AORTIC ARCH: Normal appearance of the thoracic arch, normal branch pattern. Mild calcific atherosclerosis aortic arch. The origins of the innominate, left Common carotid artery and subclavian artery are widely patent. RIGHT CAROTID SYSTEM: Common carotid artery is widely patent.  Mild calcific atherosclerosis carotid bifurcation without hemodynamically significant stenosis by NASCET criteria. Normal appearance of the internal carotid artery. LEFT CAROTID SYSTEM: Common carotid artery is widely patent. Mild calcific atherosclerosis carotid bifurcation without hemodynamically significant stenosis by NASCET criteria. Normal appearance of the internal carotid artery. VERTEBRAL ARTERIES:Left vertebral artery is dominant. Mild extrinsic compression due to degenerative cervical spine, vessels are widely patent. SKELETON: No acute osseous process though bone windows have not been submitted. Moderate to severe C3-4, C4-5, C5-6 and  LEFT C6-7 neural foraminal narrowing. Patient is edentulous. OTHER NECK: Soft tissues of the neck are nonacute though, not tailored for evaluation. Punctate LEFT submandibular sialolith. UPPER CHEST: Included lung apices are clear. No superior mediastinal lymphadenopathy. Mild centrilobular emphysema. CTA HEAD FINDINGS: ANTERIOR CIRCULATION: Patent cervical internal carotid arteries, petrous, cavernous and supra clinoid internal carotid arteries. Patent anterior communicating artery. Distal RIGHT M1 occlusion. Intermediate collateralization by single-phase CT. Mild luminal irregularity anterior cerebral arteries and LEFT middle cerebral artery's compatible with atherosclerosis. No  significant stenosis, contrast extravasation or aneurysm. POSTERIOR CIRCULATION: Patent vertebral arteries, vertebrobasilar junction and basilar artery, as well as main branch vessels. Patent posterior cerebral arteries. Small RIGHT posterior communicating artery present. Mild luminal irregularity compatible with atherosclerosis. Moderate tandem stenosis LEFT P3 segments. No large vessel occlusion, significant stenosis, contrast extravasation or aneurysm. VENOUS SINUSES: Major dural venous sinuses are patent though not tailored for evaluation on this angiographic examination. ANATOMIC VARIANTS:  None. DELAYED PHASE: Not performed. MIP images reviewed. IMPRESSION: CTA NECK: 1. Mild atherosclerosis without hemodynamically significant stenosis or acute vascular process in the neck. 2. Multilevel moderate to severe neural foraminal narrowing. CTA HEAD: 1. Emergent distal RIGHT M1 occlusion. Intermediate collateralization by single-phase CTA. 2. Intracranial atherosclerosis, moderate stenosis LEFT P3 segment. Critical Value/emergent results were called by telephone at the time of interpretation on 10/23/2017 at 4:50 pm to Dr. Milon DikesASHISH ARORA , who verbally acknowledged these results. Aortic Atherosclerosis (ICD10-I70.0) and Emphysema (ICD10-J43.9). Electronically Signed   By: Awilda Metroourtnay  Bloomer M.D.   On: 10/23/2017 17:02   Ct Head Wo Contrast  Result Date: 10/25/2017 CLINICAL DATA:  Stroke follow-up. Status post endovascular revascularization of RIGHT M1 occlusion. EXAM: CT HEAD WITHOUT CONTRAST TECHNIQUE: Contiguous axial images were obtained from the base of the skull through the vertex without intravenous contrast. COMPARISON:  CT HEAD October 23, 2017 FINDINGS: BRAIN: No intraparenchymal hemorrhage, mass effect nor midline shift. The ventricles and sulci are normal for age. Patchy supratentorial white matter hypodensities within normal range for patient's age, though non-specific are most compatible with chronic small vessel ischemic disease. Punctate densities RIGHT inferior insula new from preprocedural CT. No acute large vascular territory infarcts. No abnormal extra-axial fluid collections. Basal cisterns are patent. VASCULAR: Faint persistent subcentimeter density RIGHT M1 segment more defined than prior CT. Moderate to severe calcific atherosclerosis carotid siphons. SKULL: No skull fracture. No significant scalp soft tissue swelling. SINUSES/ORBITS: Subcentimeter RIGHT maxillary mucosal retention cysts with mild paranasal sinus mucosal thickening. No air-fluid levels. Minimal LEFT mastoid effusion.  Included ocular globes and orbital contents are non-suspicious. OTHER: None. IMPRESSION: 1. Persistent subcentimeter density RIGHT M1 segment, differential diagnosis includes recurrent embolization, enhancing residual thromboembolism. 2. Punctate densities RIGHT insula favoring enhancing minimal infarct This could be confirmed with MRI. Presence of cardiac pacemaker may not be a contraindication to MRI. Electronically Signed   By: Awilda Metroourtnay  Bloomer M.D.   On: 10/25/2017 04:31   Ct Head Wo Contrast  Result Date: 10/24/2017 CLINICAL DATA:  84 year old male post recanalization of occluded right middle cerebral artery. Subsequent encounter. EXAM: CT HEAD WITHOUT CONTRAST TECHNIQUE: Contiguous axial images were obtained from the base of the skull through the vertex without intravenous contrast. COMPARISON:  Angiogram, CT angiogram and head CT 10/23/2017. FINDINGS: Brain: Small hyperdensity adjacent to the right carotid terminus may represent small amount of blood related to recent procedure. Hyperperfusion felt to be a secondary consideration given the fact that angiogram was performed over 12 hours ago. Patient has not developed CT evidence of a large  right middle cerebral artery distribution infarct. Chronic microvascular changes. Atrophy. No intracranial mass lesion noted on this unenhanced exam. Vascular: As above Skull: No acute abnormality. Sinuses/Orbits: No acute orbital abnormality. Minimal polypoid opacification posterior right maxillary sinus. Other: Mastoid air cells and middle ear cavities are clear. IMPRESSION: Small hyperdensity adjacent to the right carotid terminus may represent small amount of blood related to recent procedure. Hyperperfusion felt to be a secondary consideration given the fact that angiogram was performed over 12 hours ago. Patient has not developed CT evidence of a large right middle cerebral artery distribution infarct. These results were called by telephone at the time of  interpretation on 10/24/2017 at 12:00 pm to [facility] patients nurse who verbally acknowledged these results. Electronically Signed   By: Lacy Duverney M.D.   On: 10/24/2017 12:04   Ct Angio Neck W Or Wo Contrast  Result Date: 10/23/2017 CLINICAL DATA:  LEFT-sided weakness and facial droop. Last seen normal at 15 20 hours. History of hypertension and hyperlipidemia, atrial fibrillation, diabetes. EXAM: CT ANGIOGRAPHY HEAD AND NECK TECHNIQUE: Multidetector CT imaging of the head and neck was performed using the standard protocol during bolus administration of intravenous contrast. Multiplanar CT image reconstructions and MIPs were obtained to evaluate the vascular anatomy. Carotid stenosis measurements (when applicable) are obtained utilizing NASCET criteria, using the distal internal carotid diameter as the denominator. CONTRAST:  50 cc Isovue 370 COMPARISON:  CT HEAD October 23, 2016 at 1629 hours FINDINGS: CTA NECK FINDINGS: AORTIC ARCH: Normal appearance of the thoracic arch, normal branch pattern. Mild calcific atherosclerosis aortic arch. The origins of the innominate, left Common carotid artery and subclavian artery are widely patent. RIGHT CAROTID SYSTEM: Common carotid artery is widely patent. Mild calcific atherosclerosis carotid bifurcation without hemodynamically significant stenosis by NASCET criteria. Normal appearance of the internal carotid artery. LEFT CAROTID SYSTEM: Common carotid artery is widely patent. Mild calcific atherosclerosis carotid bifurcation without hemodynamically significant stenosis by NASCET criteria. Normal appearance of the internal carotid artery. VERTEBRAL ARTERIES:Left vertebral artery is dominant. Mild extrinsic compression due to degenerative cervical spine, vessels are widely patent. SKELETON: No acute osseous process though bone windows have not been submitted. Moderate to severe C3-4, C4-5, C5-6 and LEFT C6-7 neural foraminal narrowing. Patient is edentulous. OTHER NECK:  Soft tissues of the neck are nonacute though, not tailored for evaluation. Punctate LEFT submandibular sialolith. UPPER CHEST: Included lung apices are clear. No superior mediastinal lymphadenopathy. Mild centrilobular emphysema. CTA HEAD FINDINGS: ANTERIOR CIRCULATION: Patent cervical internal carotid arteries, petrous, cavernous and supra clinoid internal carotid arteries. Patent anterior communicating artery. Distal RIGHT M1 occlusion. Intermediate collateralization by single-phase CT. Mild luminal irregularity anterior cerebral arteries and LEFT middle cerebral artery's compatible with atherosclerosis. No  significant stenosis, contrast extravasation or aneurysm. POSTERIOR CIRCULATION: Patent vertebral arteries, vertebrobasilar junction and basilar artery, as well as main branch vessels. Patent posterior cerebral arteries. Small RIGHT posterior communicating artery present. Mild luminal irregularity compatible with atherosclerosis. Moderate tandem stenosis LEFT P3 segments. No large vessel occlusion, significant stenosis, contrast extravasation or aneurysm. VENOUS SINUSES: Major dural venous sinuses are patent though not tailored for evaluation on this angiographic examination. ANATOMIC VARIANTS: None. DELAYED PHASE: Not performed. MIP images reviewed. IMPRESSION: CTA NECK: 1. Mild atherosclerosis without hemodynamically significant stenosis or acute vascular process in the neck. 2. Multilevel moderate to severe neural foraminal narrowing. CTA HEAD: 1. Emergent distal RIGHT M1 occlusion. Intermediate collateralization by single-phase CTA. 2. Intracranial atherosclerosis, moderate stenosis LEFT P3 segment. Critical Value/emergent results were called by  telephone at the time of interpretation on 10/23/2017 at 4:50 pm to Dr. Milon Dikes , who verbally acknowledged these results. Aortic Atherosclerosis (ICD10-I70.0) and Emphysema (ICD10-J43.9). Electronically Signed   By: Awilda Metro M.D.   On: 10/23/2017  17:02   Dg Chest Port 1 View  Result Date: 10/24/2017 CLINICAL DATA:  Stroke EXAM: PORTABLE CHEST 1 VIEW COMPARISON:  02/26/2015 FINDINGS: Right-sided pacing device as before. Mild cardiomegaly with aortic atherosclerosis. Linear atelectasis at the left base. No pneumothorax. IMPRESSION: 1. Cardiomegaly 2. Minimal subsegmental atelectasis or scarring at the left base. Electronically Signed   By: Jasmine Pang M.D.   On: 10/24/2017 00:30   Dg Swallowing Func-speech Pathology  Result Date: 10/24/2017 Objective Swallowing Evaluation: Type of Study: MBS-Modified Barium Swallow Study  Patient Details Name: GAVAN NORDBY MRN: [id number] Date of Birth: 02/13/1929 Today's Date: 10/24/2017 Time: SLP Start Time (ACUTE ONLY): 1057 -SLP Stop Time (ACUTE ONLY): 1112 SLP Time Calculation (min) (ACUTE ONLY): 15 min Past Medical History: Past Medical History: Diagnosis Date . 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 Chronic kidney disease   followed by nephrology . Coronary artery disease   remote subendo MI in February 2005 with stents to RCA &LCX; s/p scute thrombosis of both stents in February 2010 with cardiogenic shock - s/p BMS to both lesions; follow up cath in September 2010 was satisfactory.  He is medically managed with aspirin/Plavix . GERD (gastroesophageal reflux disease)  . High risk medication use   on amiodarone for PAF/VT since February 2010 . HTN (hypertension)  . Hyperlipidemia  . Obesity  . Pacemaker  . PAF (paroxysmal atrial fibrillation) (HCC)   s/p cardioversion in August 2011 . Type II or unspecified type diabetes mellitus without mention of complication, uncontrolled  Past Surgical History: Past Surgical History: Procedure Laterality Date . EP IMPLANTABLE DEVICE N/A 02/25/2015  Procedure: Lead Revision/Repair;  Surgeon: [name], MD;  Location: [facility];  Service: Cardiovascular;  Laterality: N/A; . PACEMAKER GENERATOR CHANGE  12/03/13  MDT Adapta L  generator change for premature ERI by Dr Johney Frame . PACEMAKER INSERTION  May 2011 . PERMANENT PACEMAKER GENERATOR CHANGE N/A 12/03/2013  Procedure: PERMANENT PACEMAKER GENERATOR CHANGE;  Surgeon: [name], MD;  Location: [facility];  Service: Cardiovascular;  Laterality: N/A; HPI: Pt is an 84 year old man who presents with left-sided weakness, found to have a R M1 occlusion s/p revascularization. PMH also includes afib, bradycardia s/p pacemaker, CKD, HTN, CAD, HLD, DM, GERD  Subjective: pt alert, cooperative, eager for POs Assessment / Plan / Recommendation CHL IP CLINICAL IMPRESSIONS 10/24/2017 Clinical Impression Pt has a mild-moderate oropharyngeal dysphagia, also likely mildly impacted by his lack of dentition. His mastication is only mildly prolonged without his dentures, but he also exhibits weak lingual manipulation resulting in generalized oral residue, as well as weak labial seal that allows thin liquids to anterior spill from the left during cup sips. He has improved control and posterior propulsion when drinking from a straw. Pt's base of tongue retraction and hyolaryngeal movement are reduced, which results in moderate vallecular residue with all consistencies tested. With residue present, pt had one instance of deep penetration with thin liquids, which reached his true vocal folds but did not trigger a cough response. He cleared this well with a cued cough. Second swallows did not greatly reduce his residue, but Min-Mod cues for a chin tuck did, and he had no further airway compromise. Recommend initiating Dys 2 diet  and thin liquids with use of chin tuck. Pt would benefit from full supervision to aid in use of strategies - it would also help to have his dentures brought in by family. SLP will continue to follow for tolerance, strengthening, and advancement as indicated. SLP Visit Diagnosis Dysphagia, oropharyngeal phase (R13.12) Attention and concentration deficit following -- Frontal lobe and  executive function deficit following -- Impact on safety and function Mild aspiration risk;Moderate aspiration risk   CHL IP TREATMENT RECOMMENDATION 10/24/2017 Treatment Recommendations Therapy as outlined in treatment plan below   Prognosis 10/24/2017 Prognosis for Safe Diet Advancement Good Barriers to Reach Goals -- Barriers/Prognosis Comment -- CHL IP DIET RECOMMENDATION 10/24/2017 SLP Diet Recommendations Dysphagia 2 (Fine chop) solids;Thin liquid Liquid Administration via Straw Medication Administration Whole meds with puree Compensations Minimize environmental distractions;Slow rate;Small sips/bites;Chin tuck;Other (Comment) Postural Changes Seated upright at 90 degrees;Remain semi-upright after after feeds/meals (Comment)   CHL IP OTHER RECOMMENDATIONS 10/24/2017 Recommended Consults -- Oral Care Recommendations Oral care BID Other Recommendations --   CHL IP FOLLOW UP RECOMMENDATIONS 10/24/2017 Follow up Recommendations (No Data)   CHL IP FREQUENCY AND DURATION 10/24/2017 Speech Therapy Frequency (ACUTE ONLY) min 2x/week Treatment Duration 2 weeks      CHL IP ORAL PHASE 10/24/2017 Oral Phase Impaired Oral - Pudding Teaspoon -- Oral - Pudding Cup -- Oral - Honey Teaspoon -- Oral - Honey Cup -- Oral - Nectar Teaspoon -- Oral - Nectar Cup -- Oral - Nectar Straw -- Oral - Thin Teaspoon -- Oral - Thin Cup Weak lingual manipulation;Reduced posterior propulsion;Decreased bolus cohesion;Left anterior bolus loss Oral - Thin Straw Weak lingual manipulation Oral - Puree Weak lingual manipulation;Reduced posterior propulsion;Lingual/palatal residue Oral - Mech Soft Weak lingual manipulation;Reduced posterior propulsion;Lingual/palatal residue;Impaired mastication Oral - Regular -- Oral - Multi-Consistency -- Oral - Pill -- Oral Phase - Comment --  CHL IP PHARYNGEAL PHASE 10/24/2017 Pharyngeal Phase Impaired Pharyngeal- Pudding Teaspoon -- Pharyngeal -- Pharyngeal- Pudding Cup -- Pharyngeal -- Pharyngeal- Honey Teaspoon --  Pharyngeal -- Pharyngeal- Honey Cup -- Pharyngeal -- Pharyngeal- Nectar Teaspoon -- Pharyngeal -- Pharyngeal- Nectar Cup -- Pharyngeal -- Pharyngeal- Nectar Straw -- Pharyngeal -- Pharyngeal- Thin Teaspoon -- Pharyngeal -- Pharyngeal- Thin Cup Reduced anterior laryngeal mobility;Reduced laryngeal elevation;Reduced tongue base retraction;Pharyngeal residue - valleculae;Compensatory strategies attempted (with notebox) Pharyngeal -- Pharyngeal- Thin Straw Reduced anterior laryngeal mobility;Reduced laryngeal elevation;Reduced tongue base retraction;Pharyngeal residue - valleculae;Penetration/Aspiration during swallow;Compensatory strategies attempted (with notebox) Pharyngeal Material enters airway, CONTACTS cords and not ejected out Pharyngeal- Puree Reduced anterior laryngeal mobility;Reduced laryngeal elevation;Reduced tongue base retraction;Pharyngeal residue - valleculae;Compensatory strategies attempted (with notebox) Pharyngeal -- Pharyngeal- Mechanical Soft Reduced anterior laryngeal mobility;Reduced laryngeal elevation;Reduced tongue base retraction;Pharyngeal residue - valleculae;Compensatory strategies attempted (with notebox) Pharyngeal -- Pharyngeal- Regular -- Pharyngeal -- Pharyngeal- Multi-consistency -- Pharyngeal -- Pharyngeal- Pill -- Pharyngeal -- Pharyngeal Comment --  CHL IP CERVICAL ESOPHAGEAL PHASE 10/24/2017 Cervical Esophageal Phase WFL Pudding Teaspoon -- Pudding Cup -- Honey Teaspoon -- Honey Cup -- Nectar Teaspoon -- Nectar Cup -- Nectar Straw -- Thin Teaspoon -- Thin Cup -- Thin Straw -- Puree -- Mechanical Soft -- Regular -- Multi-consistency -- Pill -- Cervical Esophageal Comment -- No flowsheet data found. Maxcine Ham 10/24/2017, 12:31 PM  Maxcine Ham, M.A. CCC-SLP [phone]             Ct Head Code Stroke Wo Contrast  Result Date: 10/23/2017 CLINICAL DATA:  Code stroke. Left facial droop and weakness developed within the last 90 minutes. EXAM: CT HEAD WITHOUT CONTRAST  TECHNIQUE: Contiguous axial images were obtained from the base of the skull through the vertex without intravenous contrast. COMPARISON:  None. FINDINGS: Brain: Generalized brain atrophy. Chronic small-vessel ischemic changes of the cerebral hemispheric white matter. No sign of acute infarction, mass lesion, hemorrhage, hydrocephalus or extra-axial collection. Vascular: There is atherosclerotic calcification of the major vessels at the base of the brain. Skull: Negative Sinuses/Orbits: Clear/normal Other: None ASPECTS ([place] Stroke Program Early CT Score) - Ganglionic level infarction (caudate, lentiform nuclei, internal capsule, insula, M1-M3 cortex): 7 - Supraganglionic infarction (M4-M6 cortex): 3 Total score (0-10 with 10 being normal): 10 IMPRESSION: 1. No acute finding. Atrophy and chronic small-vessel ischemic changes of the cerebral hemispheric white matter. 2. ASPECTS is 10. 3. These results were communicated to at 4:34 pmon 2/18/2019by text page via the [facility] messaging system. Electronically Signed   By: Paulina Fusi M.D.   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 Does the need for close, 24 hr/day medical supervision in concert with the patient's rehab needs make it unreasonable for this patient to be served in a less intensive setting? Yes 2. Co-Morbidities requiring supervision/potential complications: Probable Parkinson's disease chronic low back pain, atrial fibrillation on anticoagulation 3. Due to bladder management, bowel management, safety, skin/wound care, disease management, medication administration, pain management and patient education, does the patient require 24 hr/day rehab nursing? Yes 4. Does the patient require coordinated care of a physician, rehab nurse, PT (1-2 hrs/day, 5 days/week), OT (1-2 hrs/day, 5 days/week) and SLP (.5-1 hrs/day, 5 days/week) to address physical and functional deficits  in the context of the above medical diagnosis(es)? Yes Addressing deficits in the following areas: balance, endurance, locomotion, strength, transferring, bowel/bladder control, bathing, dressing, feeding, grooming, toileting and psychosocial support 5. Can the patient actively participate in an intensive therapy program of at least 3 hrs of therapy per day at least 5 days per week? Yes 6. The potential for patient to make measurable gains while on inpatient rehab is excellent 7. Anticipated functional outcomes upon discharge from inpatient rehab are supervision  with PT, supervision with OT, supervision with SLP. 8. Estimated rehab length of stay to reach the above functional goals is: 14-18d 9. Anticipated D/C setting: Home 10. Anticipated post D/C treatments: HH therapy 11. Overall Rehab/Functional Prognosis: good  RECOMMENDATIONS: This patient's condition is appropriate for continued rehabilitative care in the following setting: CIR Patient has agreed to participate in recommended program. Yes Note that insurance prior authorization may be required for reimbursement for recommended care.  Comment:   Erick Colace M.D.  [facility] [id number] (Sports Med, Neuromuscular Med) Diplomate Am Board of Electrodiagnostic Med  Jacquelynn Cree, PA-C 10/25/2017

## 2017-10-24 NOTE — Progress Notes (Signed)
Attempted echo 1:55 pm. PT going in

## 2017-10-24 NOTE — Progress Notes (Signed)
Referring Physician(s): Dr Pearlean BrownieSethi  Supervising Physician: Julieanne Cottoneveshwar, Sanjeev  Patient Status:  [facility]  Chief Complaint:  CVA Occluded RT IMCA M1 occlusion. S/P complete revascularization  With x 2 passes with solitaire 4mm x 40 mm retrieval device achieving a TICI 3 reperfusion. Also used 4.5 mg of IA integrelin.  Subjective:  Pt is up in chair in room PT helped pt to chair---able to stand with help Moving all 4s Answering yes/no questions General weakness   Allergies: Penicillins and Sulfonamide derivatives  Medications: Prior to Admission medications   Medication Sig Start Date End Date Taking? Authorizing Provider  acetaminophen (TYLENOL) 500 MG tablet Take 500 mg by mouth every 6 (six) hours as needed. For pain     [provider]  amiodarone (PACERONE) 200 MG tablet TAKE 1/2 TABLET BY MOUTH EVERY DAY 09/28/17   Allred, Fayrene FearingJames, MD  aspirin 81 MG tablet Take 81 mg by mouth daily.      [provider]  Cholecalciferol (VITAMIN D-3 PO) Take 1 tablet by mouth daily.    [provider]  clopidogrel (PLAVIX) 75 MG tablet TAKE 1 TABLET BY MOUTH EVERY DAY 06/23/17   Gypsy BalsamSeiler, Amber K, NP  famotidine (PEPCID) 40 MG tablet Take 1 tablet (40 mg total) by mouth daily. 07/06/17   Allred, Fayrene FearingJames, MD  fenofibrate (TRICOR) 145 MG tablet Take 1 tablet (145 mg total) by mouth daily. 06/06/17   Allred, Fayrene FearingJames, MD  Multiple Vitamin (MULTIVITAMIN) tablet Take 1 tablet by mouth daily.      [provider]  nitroGLYCERIN (NITROSTAT) 0.4 MG SL tablet Place 1 tablet (0.4 mg total) under the tongue every 5 (five) minutes as needed. For chest pain 02/27/15   Allred, Fayrene FearingJames, MD  rosuvastatin (CRESTOR) 20 MG tablet TAKE 1 TABLET BY MOUTH EVERY DAY 08/17/17   Allred, Fayrene FearingJames, MD     Vital Signs: BP 113/70   Pulse 75   Temp 97.9 F (36.6 C) (Oral)   Resp 17   Ht 5\' 7"  (1.702 m)   Wt 184 lb 1.4 oz (83.5 kg)   SpO2 98%   BMI 28.83 kg/m   Physical Exam    HENT:  Tongue midline Face symmetrical Puffs cheeks sl less on right  Musculoskeletal:  Moves all 4s to command General weakness  Neurological:  Answers questions----speech minimally slurred  Skin:  Right groin NT no bleeding No hematoma Rt foot 1+ pulses  Nursing note and vitals reviewed.   Imaging: Ct Angio Head W Or Wo Contrast  Result Date: 10/23/2017 CLINICAL DATA:  LEFT-sided weakness and facial droop. Last seen normal at 15 20 hours. History of hypertension and hyperlipidemia, atrial fibrillation, diabetes. EXAM: CT ANGIOGRAPHY HEAD AND NECK TECHNIQUE: Multidetector CT imaging of the head and neck was performed using the standard protocol during bolus administration of intravenous contrast. Multiplanar CT image reconstructions and MIPs were obtained to evaluate the vascular anatomy. Carotid stenosis measurements (when applicable) are obtained utilizing NASCET criteria, using the distal internal carotid diameter as the denominator. CONTRAST:  50 cc Isovue 370 COMPARISON:  CT HEAD October 23, 2016 at 1629 hours FINDINGS: CTA NECK FINDINGS: AORTIC ARCH: Normal appearance of the thoracic arch, normal branch pattern. Mild calcific atherosclerosis aortic arch. The origins of the innominate, left Common carotid artery and subclavian artery are widely patent. RIGHT CAROTID SYSTEM: Common carotid artery is widely patent. Mild calcific atherosclerosis carotid bifurcation without hemodynamically significant stenosis by NASCET criteria. Normal appearance of the internal carotid artery. LEFT  CAROTID SYSTEM: Common carotid artery is widely patent. Mild calcific atherosclerosis carotid bifurcation without hemodynamically significant stenosis by NASCET criteria. Normal appearance of the internal carotid artery. VERTEBRAL ARTERIES:Left vertebral artery is dominant. Mild extrinsic compression due to degenerative cervical spine, vessels are widely patent. SKELETON: No acute osseous process though bone  windows have not been submitted. Moderate to severe C3-4, C4-5, C5-6 and LEFT C6-7 neural foraminal narrowing. Patient is edentulous. OTHER NECK: Soft tissues of the neck are nonacute though, not tailored for evaluation. Punctate LEFT submandibular sialolith. UPPER CHEST: Included lung apices are clear. No superior mediastinal lymphadenopathy. Mild centrilobular emphysema. CTA HEAD FINDINGS: ANTERIOR CIRCULATION: Patent cervical internal carotid arteries, petrous, cavernous and supra clinoid internal carotid arteries. Patent anterior communicating artery. Distal RIGHT M1 occlusion. Intermediate collateralization by single-phase CT. Mild luminal irregularity anterior cerebral arteries and LEFT middle cerebral artery's compatible with atherosclerosis. No  significant stenosis, contrast extravasation or aneurysm. POSTERIOR CIRCULATION: Patent vertebral arteries, vertebrobasilar junction and basilar artery, as well as main branch vessels. Patent posterior cerebral arteries. Small RIGHT posterior communicating artery present. Mild luminal irregularity compatible with atherosclerosis. Moderate tandem stenosis LEFT P3 segments. No large vessel occlusion, significant stenosis, contrast extravasation or aneurysm. VENOUS SINUSES: Major dural venous sinuses are patent though not tailored for evaluation on this angiographic examination. ANATOMIC VARIANTS: None. DELAYED PHASE: Not performed. MIP images reviewed. IMPRESSION: CTA NECK: 1. Mild atherosclerosis without hemodynamically significant stenosis or acute vascular process in the neck. 2. Multilevel moderate to severe neural foraminal narrowing. CTA HEAD: 1. Emergent distal RIGHT M1 occlusion. Intermediate collateralization by single-phase CTA. 2. Intracranial atherosclerosis, moderate stenosis LEFT P3 segment. Critical Value/emergent results were called by telephone at the time of interpretation on 10/23/2017 at 4:50 pm to Dr. Milon Dikes , who verbally acknowledged these  results. Aortic Atherosclerosis (ICD10-I70.0) and Emphysema (ICD10-J43.9). Electronically Signed   By: Awilda Metro M.D.   On: 10/23/2017 17:02   Ct Head Wo Contrast  Result Date: 10/24/2017 CLINICAL DATA:  84 year old male post recanalization of occluded right middle cerebral artery. Subsequent encounter. EXAM: CT HEAD WITHOUT CONTRAST TECHNIQUE: Contiguous axial images were obtained from the base of the skull through the vertex without intravenous contrast. COMPARISON:  Angiogram, CT angiogram and head CT 10/23/2017. FINDINGS: Brain: Small hyperdensity adjacent to the right carotid terminus may represent small amount of blood related to recent procedure. Hyperperfusion felt to be a secondary consideration given the fact that angiogram was performed over 12 hours ago. Patient has not developed CT evidence of a large right middle cerebral artery distribution infarct. Chronic microvascular changes. Atrophy. No intracranial mass lesion noted on this unenhanced exam. Vascular: As above Skull: No acute abnormality. Sinuses/Orbits: No acute orbital abnormality. Minimal polypoid opacification posterior right maxillary sinus. Other: Mastoid air cells and middle ear cavities are clear. IMPRESSION: Small hyperdensity adjacent to the right carotid terminus may represent small amount of blood related to recent procedure. Hyperperfusion felt to be a secondary consideration given the fact that angiogram was performed over 12 hours ago. Patient has not developed CT evidence of a large right middle cerebral artery distribution infarct. These results were called by telephone at the time of interpretation on 10/24/2017 at 12:00 pm to [facility] patients nurse who verbally acknowledged these results. Electronically Signed   By: Lacy Duverney M.D.   On: 10/24/2017 12:04   Ct Angio Neck W Or Wo Contrast  Result Date: 10/23/2017 CLINICAL DATA:  LEFT-sided weakness and facial droop. Last seen normal at 15 20 hours. History of  hypertension and hyperlipidemia, atrial fibrillation, diabetes. EXAM: CT ANGIOGRAPHY HEAD AND NECK TECHNIQUE: Multidetector CT imaging of the head and neck was performed using the standard protocol during bolus administration of intravenous contrast. Multiplanar CT image reconstructions and MIPs were obtained to evaluate the vascular anatomy. Carotid stenosis measurements (when applicable) are obtained utilizing NASCET criteria, using the distal internal carotid diameter as the denominator. CONTRAST:  50 cc Isovue 370 COMPARISON:  CT HEAD October 23, 2016 at 1629 hours FINDINGS: CTA NECK FINDINGS: AORTIC ARCH: Normal appearance of the thoracic arch, normal branch pattern. Mild calcific atherosclerosis aortic arch. The origins of the innominate, left Common carotid artery and subclavian artery are widely patent. RIGHT CAROTID SYSTEM: Common carotid artery is widely patent. Mild calcific atherosclerosis carotid bifurcation without hemodynamically significant stenosis by NASCET criteria. Normal appearance of the internal carotid artery. LEFT CAROTID SYSTEM: Common carotid artery is widely patent. Mild calcific atherosclerosis carotid bifurcation without hemodynamically significant stenosis by NASCET criteria. Normal appearance of the internal carotid artery. VERTEBRAL ARTERIES:Left vertebral artery is dominant. Mild extrinsic compression due to degenerative cervical spine, vessels are widely patent. SKELETON: No acute osseous process though bone windows have not been submitted. Moderate to severe C3-4, C4-5, C5-6 and LEFT C6-7 neural foraminal narrowing. Patient is edentulous. OTHER NECK: Soft tissues of the neck are nonacute though, not tailored for evaluation. Punctate LEFT submandibular sialolith. UPPER CHEST: Included lung apices are clear. No superior mediastinal lymphadenopathy. Mild centrilobular emphysema. CTA HEAD FINDINGS: ANTERIOR CIRCULATION: Patent cervical internal carotid arteries, petrous, cavernous  and supra clinoid internal carotid arteries. Patent anterior communicating artery. Distal RIGHT M1 occlusion. Intermediate collateralization by single-phase CT. Mild luminal irregularity anterior cerebral arteries and LEFT middle cerebral artery's compatible with atherosclerosis. No  significant stenosis, contrast extravasation or aneurysm. POSTERIOR CIRCULATION: Patent vertebral arteries, vertebrobasilar junction and basilar artery, as well as main branch vessels. Patent posterior cerebral arteries. Small RIGHT posterior communicating artery present. Mild luminal irregularity compatible with atherosclerosis. Moderate tandem stenosis LEFT P3 segments. No large vessel occlusion, significant stenosis, contrast extravasation or aneurysm. VENOUS SINUSES: Major dural venous sinuses are patent though not tailored for evaluation on this angiographic examination. ANATOMIC VARIANTS: None. DELAYED PHASE: Not performed. MIP images reviewed. IMPRESSION: CTA NECK: 1. Mild atherosclerosis without hemodynamically significant stenosis or acute vascular process in the neck. 2. Multilevel moderate to severe neural foraminal narrowing. CTA HEAD: 1. Emergent distal RIGHT M1 occlusion. Intermediate collateralization by single-phase CTA. 2. Intracranial atherosclerosis, moderate stenosis LEFT P3 segment. Critical Value/emergent results were called by telephone at the time of interpretation on 10/23/2017 at 4:50 pm to Dr. Milon Dikes , who verbally acknowledged these results. Aortic Atherosclerosis (ICD10-I70.0) and Emphysema (ICD10-J43.9). Electronically Signed   By: Awilda Metro M.D.   On: 10/23/2017 17:02   Dg Chest Port 1 View  Result Date: 10/24/2017 CLINICAL DATA:  Stroke EXAM: PORTABLE CHEST 1 VIEW COMPARISON:  02/26/2015 FINDINGS: Right-sided pacing device as before. Mild cardiomegaly with aortic atherosclerosis. Linear atelectasis at the left base. No pneumothorax. IMPRESSION: 1. Cardiomegaly 2. Minimal subsegmental  atelectasis or scarring at the left base. Electronically Signed   By: Jasmine Pang M.D.   On: 10/24/2017 00:30   Dg Swallowing Func-speech Pathology  Result Date: 10/24/2017 Objective Swallowing Evaluation: Type of Study: MBS-Modified Barium Swallow Study  Patient Details Name: PARTH MCCORMAC MRN: [id number] Date of Birth: 01-16-1929 Today's Date: 10/24/2017 Time: SLP Start Time (ACUTE ONLY): 1057 -SLP Stop Time (ACUTE ONLY): 1112 SLP Time Calculation (min) (ACUTE ONLY): 15 min Past Medical  History: Past Medical History: Diagnosis Date . 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 Chronic kidney disease   followed by nephrology . Coronary artery disease   remote subendo MI in February 2005 with stents to RCA &LCX; s/p scute thrombosis of both stents in February 2010 with cardiogenic shock - s/p BMS to both lesions; follow up cath in September 2010 was satisfactory.  He is medically managed with aspirin/Plavix . GERD (gastroesophageal reflux disease)  . High risk medication use   on amiodarone for PAF/VT since February 2010 . HTN (hypertension)  . Hyperlipidemia  . Obesity  . Pacemaker  . PAF (paroxysmal atrial fibrillation) (HCC)   s/p cardioversion in August 2011 . Type II or unspecified type diabetes mellitus without mention of complication, uncontrolled  Past Surgical History: Past Surgical History: Procedure Laterality Date . EP IMPLANTABLE DEVICE N/A 02/25/2015  Procedure: Lead Revision/Repair;  Surgeon: [name], MD;  Location: [facility] INVASIVE CV LAB;  Service: Cardiovascular;  Laterality: N/A; . PACEMAKER GENERATOR CHANGE  12/03/13  MDT Adapta L generator change for premature ERI by Dr Johney Frame . PACEMAKER INSERTION  May 2011 . PERMANENT PACEMAKER GENERATOR CHANGE N/A 12/03/2013  Procedure: PERMANENT PACEMAKER GENERATOR CHANGE;  Surgeon: [name], MD;  Location: [facility] CATH LAB;  Service: Cardiovascular;  Laterality: N/A; HPI: Pt is an 84 year old man who presents with left-sided  weakness, found to have a R M1 occlusion s/p revascularization. PMH also includes afib, bradycardia s/p pacemaker, CKD, HTN, CAD, HLD, DM, GERD  Subjective: pt alert, cooperative, eager for POs Assessment / Plan / Recommendation CHL IP CLINICAL IMPRESSIONS 10/24/2017 Clinical Impression Pt has a mild-moderate oropharyngeal dysphagia, also likely mildly impacted by his lack of dentition. His mastication is only mildly prolonged without his dentures, but he also exhibits weak lingual manipulation resulting in generalized oral residue, as well as weak labial seal that allows thin liquids to anterior spill from the left during cup sips. He has improved control and posterior propulsion when drinking from a straw. Pt's base of tongue retraction and hyolaryngeal movement are reduced, which results in moderate vallecular residue with all consistencies tested. With residue present, pt had one instance of deep penetration with thin liquids, which reached his true vocal folds but did not trigger a cough response. He cleared this well with a cued cough. Second swallows did not greatly reduce his residue, but Min-Mod cues for a chin tuck did, and he had no further airway compromise. Recommend initiating Dys 2 diet and thin liquids with use of chin tuck. Pt would benefit from full supervision to aid in use of strategies - it would also help to have his dentures brought in by family. SLP will continue to follow for tolerance, strengthening, and advancement as indicated. SLP Visit Diagnosis Dysphagia, oropharyngeal phase (R13.12) Attention and concentration deficit following -- Frontal lobe and executive function deficit following -- Impact on safety and function Mild aspiration risk;Moderate aspiration risk   CHL IP TREATMENT RECOMMENDATION 10/24/2017 Treatment Recommendations Therapy as outlined in treatment plan below   Prognosis 10/24/2017 Prognosis for Safe Diet Advancement Good Barriers to Reach Goals -- Barriers/Prognosis  Comment -- CHL IP DIET RECOMMENDATION 10/24/2017 SLP Diet Recommendations Dysphagia 2 (Fine chop) solids;Thin liquid Liquid Administration via Straw Medication Administration Whole meds with puree Compensations Minimize environmental distractions;Slow rate;Small sips/bites;Chin tuck;Other (Comment) Postural Changes Seated upright at 90 degrees;Remain semi-upright after after feeds/meals (Comment)   CHL IP OTHER RECOMMENDATIONS 10/24/2017 Recommended Consults -- Oral Care Recommendations Oral care  BID Other Recommendations --   CHL IP FOLLOW UP RECOMMENDATIONS 10/24/2017 Follow up Recommendations (No Data)   CHL IP FREQUENCY AND DURATION 10/24/2017 Speech Therapy Frequency (ACUTE ONLY) min 2x/week Treatment Duration 2 weeks      CHL IP ORAL PHASE 10/24/2017 Oral Phase Impaired Oral - Pudding Teaspoon -- Oral - Pudding Cup -- Oral - Honey Teaspoon -- Oral - Honey Cup -- Oral - Nectar Teaspoon -- Oral - Nectar Cup -- Oral - Nectar Straw -- Oral - Thin Teaspoon -- Oral - Thin Cup Weak lingual manipulation;Reduced posterior propulsion;Decreased bolus cohesion;Left anterior bolus loss Oral - Thin Straw Weak lingual manipulation Oral - Puree Weak lingual manipulation;Reduced posterior propulsion;Lingual/palatal residue Oral - Mech Soft Weak lingual manipulation;Reduced posterior propulsion;Lingual/palatal residue;Impaired mastication Oral - Regular -- Oral - Multi-Consistency -- Oral - Pill -- Oral Phase - Comment --  CHL IP PHARYNGEAL PHASE 10/24/2017 Pharyngeal Phase Impaired Pharyngeal- Pudding Teaspoon -- Pharyngeal -- Pharyngeal- Pudding Cup -- Pharyngeal -- Pharyngeal- Honey Teaspoon -- Pharyngeal -- Pharyngeal- Honey Cup -- Pharyngeal -- Pharyngeal- Nectar Teaspoon -- Pharyngeal -- Pharyngeal- Nectar Cup -- Pharyngeal -- Pharyngeal- Nectar Straw -- Pharyngeal -- Pharyngeal- Thin Teaspoon -- Pharyngeal -- Pharyngeal- Thin Cup Reduced anterior laryngeal mobility;Reduced laryngeal elevation;Reduced tongue base  retraction;Pharyngeal residue - valleculae;Compensatory strategies attempted (with notebox) Pharyngeal -- Pharyngeal- Thin Straw Reduced anterior laryngeal mobility;Reduced laryngeal elevation;Reduced tongue base retraction;Pharyngeal residue - valleculae;Penetration/Aspiration during swallow;Compensatory strategies attempted (with notebox) Pharyngeal Material enters airway, CONTACTS cords and not ejected out Pharyngeal- Puree Reduced anterior laryngeal mobility;Reduced laryngeal elevation;Reduced tongue base retraction;Pharyngeal residue - valleculae;Compensatory strategies attempted (with notebox) Pharyngeal -- Pharyngeal- Mechanical Soft Reduced anterior laryngeal mobility;Reduced laryngeal elevation;Reduced tongue base retraction;Pharyngeal residue - valleculae;Compensatory strategies attempted (with notebox) Pharyngeal -- Pharyngeal- Regular -- Pharyngeal -- Pharyngeal- Multi-consistency -- Pharyngeal -- Pharyngeal- Pill -- Pharyngeal -- Pharyngeal Comment --  CHL IP CERVICAL ESOPHAGEAL PHASE 10/24/2017 Cervical Esophageal Phase WFL Pudding Teaspoon -- Pudding Cup -- Honey Teaspoon -- Honey Cup -- Nectar Teaspoon -- Nectar Cup -- Nectar Straw -- Thin Teaspoon -- Thin Cup -- Thin Straw -- Puree -- Mechanical Soft -- Regular -- Multi-consistency -- Pill -- Cervical Esophageal Comment -- No flowsheet data found. Maxcine Ham 10/24/2017, 12:31 PM  Maxcine Ham, M.A. CCC-SLP [phone]             Ct Head Code Stroke Wo Contrast  Result Date: 10/23/2017 CLINICAL DATA:  Code stroke. Left facial droop and weakness developed within the last 90 minutes. EXAM: CT HEAD WITHOUT CONTRAST TECHNIQUE: Contiguous axial images were obtained from the base of the skull through the vertex without intravenous contrast. COMPARISON:  None. FINDINGS: Brain: Generalized brain atrophy. Chronic small-vessel ischemic changes of the cerebral hemispheric white matter. No sign of acute infarction, mass lesion, hemorrhage,  hydrocephalus or extra-axial collection. Vascular: There is atherosclerotic calcification of the major vessels at the base of the brain. Skull: Negative Sinuses/Orbits: Clear/normal Other: None ASPECTS ([place] Stroke Program Early CT Score) - Ganglionic level infarction (caudate, lentiform nuclei, internal capsule, insula, M1-M3 cortex): 7 - Supraganglionic infarction (M4-M6 cortex): 3 Total score (0-10 with 10 being normal): 10 IMPRESSION: 1. No acute finding. Atrophy and chronic small-vessel ischemic changes of the cerebral hemispheric white matter. 2. ASPECTS is 10. 3. These results were communicated to at 4:34 pmon 2/18/2019by text page via the [facility] messaging system. Electronically Signed   By: Paulina Fusi M.D.   On: 10/23/2017 16:34    Labs:  CBC: Recent Labs    06/12/17 1051 10/23/17  1618 10/23/17 1631 10/24/17 0335  WBC 6.9 4.4  --  9.0  HGB 14.0 13.7 14.3 12.2*  HCT 42.7 42.2 42.0 36.9*  PLT 175 147*  --  140*    COAGS: Recent Labs    10/23/17 1618  INR 1.04  APTT 27    BMP: Recent Labs    06/12/17 1051 10/23/17 1618 10/23/17 1631 10/24/17 0335  NA 140 136 139 136  K 4.1 3.8 3.8 4.0  CL 102 104 102 106  CO2 24 22  --  21*  GLUCOSE 149* 266* 256* 236*  BUN 26 26* 28* 23*  CALCIUM 9.4 9.0  --  8.5*  CREATININE 1.86* 1.88* 1.70* 1.59*  GFRNONAA 32* 31*  --  38*  GFRAA 37* 36*  --  44*    LIVER FUNCTION TESTS: Recent Labs    12/14/16 1056 06/12/17 1051 10/23/17 1618  BILITOT 0.6 0.5 0.7  AST 24 21 28   ALT 15 13 16*  ALKPHOS 66 70 69  PROT 6.7 6.8 6.2*  ALBUMIN 4.0 4.1 3.4*    Assessment and Plan:  CVA R MCA revascularization Doing well Will follow---plan per Stroke MD  Electronically Signed: Maryanne Huneycutt A, PA-C 10/24/2017, 2:25 PM   I spent a total of 15 Minutes at the the patient's bedside AND on the patient's hospital floor or unit, greater than 50% of which was counseling/coordinating care for R MCA revascularization

## 2017-10-24 NOTE — Progress Notes (Signed)
Received call from Dr Leona Alen Goltzlson, radiologist, regarding CT head results showing small bleed in area of IR intervention.  Kent ColtMary Costello, NP with stroke team notified and will convey results to Dr. Pearlean BrownieSethi.   Will continue to monitor patient for neurologic changes. Swayze Pries C

## 2017-10-24 NOTE — Progress Notes (Signed)
Modified Barium Swallow Progress Note  Patient Details  Name: Kent Nolan MRN: [id number] Date of Birth: 08/24/1929  Today's Date: 10/24/2017  Modified Barium Swallow completed.  Full report located under Chart Review in the Imaging Section.  Brief recommendations include the following:  Clinical Impression  Pt has a mild-moderate oropharyngeal dysphagia, also likely mildly impacted by his lack of dentition. His mastication is only mildly prolonged without his dentures, but he also exhibits weak lingual manipulation resulting in generalized oral residue, as well as weak labial seal that allows thin liquids to anterior spill from the left during cup sips. He has improved control and posterior propulsion when drinking from a straw. Pt's base of tongue retraction and hyolaryngeal movement are reduced, which results in moderate vallecular residue with all consistencies tested. With residue present, pt had one instance of deep penetration with thin liquids, which reached his true vocal folds but did not trigger a cough response. He cleared this well with a cued cough. Second swallows did not greatly reduce his residue, but Min-Mod cues for a chin tuck did, and he had no further airway compromise. Recommend initiating Dys 2 diet and thin liquids with use of chin tuck. Pt would benefit from full supervision to aid in use of strategies - it would also help to have his dentures brought in by family. SLP will continue to follow for tolerance, strengthening, and advancement as indicated.   Swallow Evaluation Recommendations       SLP Diet Recommendations: Dysphagia 2 (Fine chop) solids;Thin liquid   Liquid Administration via: Straw   Medication Administration: Whole meds with puree   Supervision: Staff to assist with self feeding;Full supervision/cueing for compensatory strategies   Compensations: Minimize environmental distractions;Slow rate;Small sips/bites;Chin tuck;Other (Comment)(cough  intermittently)   Postural Changes: Seated upright at 90 degrees;Remain semi-upright after after feeds/meals (Comment)   Oral Care Recommendations: Oral care BID        Kent Nolan, Kent Nolan 10/24/2017,11:51 AM   Kent Nolan, Kent Nolan [phone]

## 2017-10-24 NOTE — Evaluation (Signed)
Occupational Therapy Evaluation Patient Details Name: Kent Nolan MRN: [id number] DOB: 14-Sep-1928 Today's Date: 10/24/2017    History of Present Illness This 84 y.o. male admitted after sustaining a fall and Lt sided weakness.  Symptoms improved en route to ED.  CT of head showed possible dense Rt MCA, and CTA showed Rt M1 occlusion.  He underwent embolectomy.   PMH includes bradycardia, CKD, CAD, HNT, pacemaker, PAF, DM   Clinical Impression   Pt admitted with above. He demonstrates the below listed deficits and will benefit from continued OT to maximize safety and independence with BADLs.  Pt presents to OT with impaired cognition including decreased awareness, impaired balance, generalized weakness.  He requires min - max A for ADLs and mod A +2 for functional transfers.  He lives with his wife and reports he was mod I PTA, including driving.  Feel he would benefit from intensive rehab at discharge  In a specialized stroke center, to allow him to maximize safety and independence with ADLs, as well as reduce risk of falls, injury, and readmission.  Pt is very motivated and eager to return home.  Will follow acutely.         Follow Up Recommendations  CIR    Equipment Recommendations  3 in 1 bedside commode    Recommendations for Other Services Rehab consult     Precautions / Restrictions Precautions Precautions: Fall      Mobility Bed Mobility Overal bed mobility: Needs Assistance Bed Mobility: Supine to Sit     Supine to sit: Mod assist     General bed mobility comments: requires assist to move LEs off bed, to lift trunk and to scoot hips to the EOB   Transfers Overall transfer level: Needs assistance   Transfers: Sit to/from Stand;Stand Pivot Transfers Sit to Stand: Mod assist;+2 safety/equipment Stand pivot transfers: Mod assist;+2 physical assistance       General transfer comment: Pt with heavy posterior lean - requires mod cues to correct.  Pt unsteady  during transfer, attempts to sit prematurely     Balance Overall balance assessment: Needs assistance Sitting-balance support: Feet supported Sitting balance-Leahy Scale: Fair     Standing balance support: Bilateral upper extremity supported Standing balance-Leahy Scale: Poor Standing balance comment: pt requires bil UE support and mod A for balance                            ADL either performed or assessed with clinical judgement   ADL Overall ADL's : Needs assistance/impaired Eating/Feeding: Set up;Sitting   Grooming: Wash/dry hands;Wash/dry face;Oral care;Set up;Supervision/safety;Sitting   Upper Body Bathing: Minimal assistance;Sitting   Lower Body Bathing: Maximal assistance;Sit to/from stand   Upper Body Dressing : Minimal assistance;Sitting   Lower Body Dressing: Maximal assistance;Sit to/from stand Lower Body Dressing Details (indicate cue type and reason): Pt crosses ankles over knees in attempts to doff socks, but unable to maintain LEs crossed.   Toilet Transfer: Moderate assistance;+2 for physical assistance;Stand-pivot;BSC;RW   Toileting- Clothing Manipulation and Hygiene: Maximal assistance;Sit to/from stand       Functional mobility during ADLs: Moderate assistance;+2 for physical assistance;Rolling walker General ADL Comments: Pt with posterior lean.  requires Assist with standing balance, and is unable to access feet      Vision Baseline Vision/History: Wears glasses;Cataracts Wears Glasses: Reading only Patient Visual Report: No change from baseline Vision Assessment?: Yes Eye Alignment: Within Functional Limits Ocular Range of Motion: Within Functional  Limits Alignment/Gaze Preference: Within Defined Limits Tracking/Visual Pursuits: Able to track stimulus in all quads without difficulty Visual Fields: No apparent deficits Additional Comments: able to read clock on wall      Perception Perception Perception Tested?: Yes   Praxis  Praxis Praxis tested?: Within functional limits    Pertinent Vitals/Pain Pain Assessment: Faces Faces Pain Scale: Hurts little more Pain Location: low back (chronic)  Pain Descriptors / Indicators: Restless Pain Intervention(s): Monitored during session     Hand Dominance Right   Extremity/Trunk Assessment Upper Extremity Assessment Upper Extremity Assessment: Generalized weakness(mild tremor noted )   Lower Extremity Assessment Lower Extremity Assessment: Generalized weakness(mild tremor noted)   Cervical / Trunk Assessment Cervical / Trunk Assessment: Kyphotic   Communication Communication Communication: HOH;Expressive difficulties(dysarthria )   Cognition Arousal/Alertness: Awake/alert Behavior During Therapy: WFL for tasks assessed/performed Overall Cognitive Status: Impaired/Different from baseline Area of Impairment: Attention;Safety/judgement;Awareness;Problem solving                   Current Attention Level: Sustained     Safety/Judgement: Decreased awareness of safety;Decreased awareness of deficits Awareness: Intellectual Problem Solving: Slow processing;Difficulty sequencing;Requires verbal cues;Requires tactile cues     General Comments  VSS     Exercises     Shoulder Instructions      Home Living Family/patient expects to be discharged to:: Private residence Living Arrangements: Spouse/significant other Available Help at Discharge: Family;Available 24 hours/day Type of Home: House Home Access: Stairs to enter [employer] of Steps: 1 at rear entry which is the entrance most used    Home Layout: One level     Bathroom Shower/Tub: Producer, television/film/video: Handicapped height     Home Equipment: Environmental consultant - 2 wheels;Walker - 4 wheels;Cane - single point;Shower seat   Additional Comments: Pt HOH and dysarthric making it a bit difficult to obtain info accurately.  No family  present during eval       Prior  Functioning/Environment Level of Independence: Independent        Comments: Pt reports his wife assists him sometimes with putting on his suspenders, otherwise he indicates he is independent.  He reports he drives sometimes, but mostly wife does the driving.  He prefers tub baths.  He walks with RW when Low back flares up         OT Problem List: Decreased strength;Decreased activity tolerance;Impaired balance (sitting and/or standing);Decreased cognition;Decreased safety awareness;Decreased knowledge of use of DME or AE;Obesity      OT Treatment/Interventions: Self-care/ADL training;Neuromuscular education;DME and/or AE instruction;Therapeutic activities;Cognitive remediation/compensation;Patient/family education;Balance training    OT Goals(Current goals can be found in the care plan section) Acute Rehab OT Goals Patient Stated Goal: to go home  OT Goal Formulation: With patient Time For Goal Achievement: 11/07/17 Potential to Achieve Goals: Good ADL Goals Pt Will Perform Grooming: with min assist;standing Pt Will Perform Upper Body Bathing: with set-up;with supervision;sitting Pt Will Perform Lower Body Bathing: with min assist;sit to/from stand Pt Will Perform Upper Body Dressing: with set-up;with supervision;sitting Pt Will Perform Lower Body Dressing: with min assist;sit to/from stand Pt Will Transfer to Toilet: with min assist;ambulating;regular height toilet;bedside commode;grab bars Pt Will Perform Toileting - Clothing Manipulation and hygiene: with min assist;sit to/from stand  OT Frequency: Min 2X/week   Barriers to D/C:            Co-evaluation              AM-PAC PT "6 Clicks" Daily Activity  Outcome Measure Help from another person eating meals?: A Little Help from another person taking care of personal grooming?: A Little Help from another person toileting, which includes using toliet, bedpan, or urinal?: A Lot Help from another person bathing  (including washing, rinsing, drying)?: A Lot Help from another person to put on and taking off regular upper body clothing?: A Lot Help from another person to put on and taking off regular lower body clothing?: A Lot 6 Click Score: 14   End of Session Equipment Utilized During Treatment: Rolling walker;Gait belt Nurse Communication: Mobility status  Activity Tolerance: Patient tolerated treatment well Patient left: in chair;with call bell/phone within reach;with chair alarm set  OT Visit Diagnosis: Unsteadiness on feet (R26.81)                Time: [id number] OT Time Calculation (min): 24 min Charges:  OT General Charges $OT Visit: 1 Visit OT Evaluation $OT Eval Moderate Complexity: 1 Mod OT Treatments $Neuromuscular Re-education: 8-22 mins G-Codes:     Reynolds AmericanWendi Kenni Newton, OTR/L [phone]   Jeani HawkingConarpe, Donnice Nielsen M 10/24/2017, 2:53 PM

## 2017-10-24 NOTE — Evaluation (Signed)
Speech Language Pathology Evaluation Patient Details Name: Kent Nolan MRN: [id number] DOB: 29-Apr-1929 Today's Date: 10/24/2017 Time: 4098-1191 SLP Time Calculation (min) (ACUTE ONLY): 20 min  Problem List:  Patient Active Problem List   Diagnosis Date Noted  . Stroke (cerebrum) (HCC) 10/23/2017  . 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 Lower back pain 03/30/2014  . Encounter for preventative adult health care exam with abnormal findings 11/26/2013  . Diabetes (HCC) 11/26/2013  . Coronary artery disease   . PAF (paroxysmal atrial fibrillation) (HCC)   . GERD (gastroesophageal reflux disease)   . Hyperlipidemia   . Chronic kidney disease   . Pacemaker   . Essential hypertension, benign 08/04/2010  . Sick sinus syndrome (HCC) 08/04/2010   Past Medical History:  Past Medical History:  Diagnosis Date  . BRADYCARDIA 08/04/2010   s/p PPM since 2011 - generator change 12/03/13  . Chronic kidney disease    followed by nephrology  . Coronary artery disease    remote subendo MI in February 2005 with stents to RCA &LCX; s/p scute thrombosis of both stents in February 2010 with cardiogenic shock - s/p BMS to both lesions; follow up cath in September 2010 was satisfactory.  He is medically managed with aspirin/Plavix  . GERD (gastroesophageal reflux disease)   . High risk medication use    on amiodarone for PAF/VT since February 2010  . HTN (hypertension)   . Hyperlipidemia   . Obesity   . Pacemaker   . PAF (paroxysmal atrial fibrillation) (HCC)    s/p cardioversion in August 2011  . Type II or unspecified type diabetes mellitus without mention of complication, uncontrolled    Past Surgical History:  Past Surgical History:  Procedure Laterality Date  . EP IMPLANTABLE DEVICE N/A 02/25/2015   Procedure: Lead Revision/Repair;  Surgeon: [name], MD;  Location: [facility];  Service: Cardiovascular;  Laterality: N/A;  .  PACEMAKER GENERATOR CHANGE  12/03/13   MDT Adapta L generator change for premature ERI by Dr Johney Frame  . PACEMAKER INSERTION  May 2011  . PERMANENT PACEMAKER GENERATOR CHANGE N/A 12/03/2013   Procedure: PERMANENT PACEMAKER GENERATOR CHANGE;  Surgeon: [name], MD;  Location: [facility] CATH [facility];  Service: Cardiovascular;  Laterality: N/A;   HPI:  Pt is an 84 year old man who presents with left-sided weakness, found to have a R M1 occlusion s/p revascularization. PMH also includes afib, bradycardia s/p pacemaker, CKD, HTN, CAD, HLD, DM, GERD   Assessment / Plan / Recommendation Clinical Impression  Pt has mild left-sided facial weakness that impacts articulation. Intelligibility of speech is mildly reduced, and I suspect that some of this is further impacted by his edentulous state. His wife is at bedside and agrees to bring them in as well as his hearing aids. His overall awareness is impaired despite SLP education about current level of function. SLP will continue to follow acutely to maximize functional communication and safety awareness.    SLP Assessment  SLP Recommendation/Assessment: Patient needs continued Speech Lanaguage Pathology Services SLP Visit Diagnosis: Dysarthria and anarthria (R47.1);Cognitive communication deficit (R41.841)    Follow Up Recommendations  (tba)    Frequency and Duration min 2x/week  2 weeks      SLP Evaluation Cognition  Overall Cognitive Status: Impaired/Different from baseline Arousal/Alertness: Awake/alert Orientation Level: Oriented X4 Attention: Sustained Sustained Attention: Appears intact Awareness: Impaired Awareness Impairment: Intellectual impairment;Emergent impairment;Anticipatory impairment Problem Solving: Appears intact(basic, functional tasks)  Comprehension  Auditory Comprehension Overall Auditory Comprehension: Appears within functional limits for tasks assessed    Expression Expression Primary Mode of Expression:  Verbal Verbal Expression Overall Verbal Expression: Appears within functional limits for tasks assessed   Oral / Motor  Oral Motor/Sensory Function Overall Oral Motor/Sensory Function: Mild impairment Facial ROM: Reduced left;Suspected CN VII (facial) dysfunction Facial Symmetry: Abnormal symmetry left;Suspected CN VII (facial) dysfunction Facial Strength: Reduced left;Suspected CN VII (facial) dysfunction Lingual ROM: Within Functional Limits Lingual Symmetry: Within Functional Limits Velum: Within Functional Limits Mandible: Within Functional Limits Motor Speech Overall Motor Speech: Impaired Respiration: Within functional limits Phonation: Normal Resonance: Hyponasality Articulation: Impaired Level of Impairment: Conversation Intelligibility: Intelligibility reduced Conversation: 75-100% accurate   GO                    [name], [name] 10/24/2017, 10:14 AM  Maxcine HamLaura Paiewonsky, M.A. CCC-SLP [phone]

## 2017-10-24 NOTE — Progress Notes (Signed)
NEUROHOSPITALISTS STROKE TEAM - DAILY PROGRESS NOTE   ADMISSION HISTORY: Kent Nolan is a 84 y.o. male past medical history of paroxysmal atrial fibrillation not on anticoagulation currently on dual antiplatelets, bradycardia status post pacemaker 2011, chronic kidney disease, hypertension, coronary artery disease, hyperlipidemia, diabetes, prior best report by EMS last seen normal at 3:10 PM today 10/23/2017, was noted by the wife to have witnessed a fall as he was trying to get out of the chair and go to use the bathroom. EMS was called.  Upon EMS initial assessment, there was some left-sided weakness that started to improve.  They made him walk which presumably worsened his symptoms.  En route, he started becoming better with only some dysarthria and left facial droop, which I also found on my exam. No preceding sickness or illness.  No chest pain shortness of breath.  No nausea vomiting.  No visual symptoms.  No headaches. No history of hitting his head on the floor during the fall. LKW: 3:10 PM on 10/23/2017 tpa given?: no, low NIH. Premorbid modified Rankin scale (mRS): 2 Initial NIH on arrival at the ER bridge-2-1 for dysarthria 1 for facial. Repeat NIH exam probably worse- 2 for dysarthria, may be 1 for the left arm, and 1 left face.  SUBJECTIVE (INTERVAL HISTORY) Family is at the bedside. Patient is found laying in bed in NAD. Overall they feel his condition is gradually improving. Voices no new complaints. No new events reported overnight.   OBJECTIVE Lab Results: CBC:  Recent Labs  Lab 10/23/17 1618 10/23/17 1631 10/24/17 0335  WBC 4.4  --  9.0  HGB 13.7 14.3 12.2*  HCT 42.2 42.0 36.9*  MCV 97.9  --  95.8  PLT 147*  --  140*   BMP: Recent Labs  Lab 10/23/17 1618 10/23/17 1631 10/24/17 0335  NA 136 139 136  K 3.8 3.8 4.0  CL 104 102 106  CO2 22  --  21*  GLUCOSE 266* 256* 236*  BUN 26* 28* 23*  CREATININE  1.88* 1.70* 1.59*  CALCIUM 9.0  --  8.5*   Liver Function Tests:  Recent Labs  Lab 10/23/17 1618  AST 28  ALT 16*  ALKPHOS 69  BILITOT 0.7  PROT 6.2*  ALBUMIN 3.4*   Coagulation Studies:  Recent Labs    10/23/17 1618  APTT 27  INR 1.04   Microbiology:   PHYSICAL EXAM Temp:  [97.5 F (36.4 C)-98.6 F (37 C)] 97.9 F (36.6 C) (02/19 1200) Pulse Rate:  [59-83] 73 (02/19 1600) Resp:  [10-29] 20 (02/19 1600) BP: (81-148)/(48-82) 117/73 (02/19 1600) SpO2:  [95 %-100 %] 99 % (02/19 1600) Arterial Line BP: (110-164)/(49-73) 147/64 (02/19 1600) FiO2 (%):  [0 %] 0 % (02/18 1944) Weight:  [83.5 kg (184 lb 1.4 oz)] 83.5 kg (184 lb 1.4 oz) (02/18 2045) General - Well nourished, well developed, in no apparent distress HEENT-  Normocephalic,    Cardiovascular - Regular rate and rhythm  Respiratory - Lungs clear bilaterally. No wheezing. Abdomen - soft and non-tender, BS normal Extremities- no edema or cyanosis Patient is awake alert oriented x3. His speech is mildly dysarthric.  Naming, attention and repetition intact. Cranial nerves: Pupils equal round reactive to light, extra ocular movements intact, visual fields full, mild left lower facial weakness, facial sensation intact bilaterally, auditory acuity grossly reduced even with hearing aids in place, palate elevates symmetrically, shoulder shrug intact, tongue midline. Motor exam:+ drift on Left. Mild left grip weakness. Diminished fine  finger movements on the left. Orbits right over left approximately. Mild pill-rolling tremor at rest in the left hand. Cogwheel rigidity bilaterally. Positive glabellar tap. Diminished facial expression. Sensory exam: No decreased sensation.  No extinction. Coordination: not tested DTRs: Absent in both ankles, trace DTRs in both knees.  Trace DTRs in both biceps and triceps. Gait testing was deferred at this time  IMAGING: I have personally reviewed the radiological images below and agree with  the radiology interpretations.  Ct Head Wo Contrast Result Date: 10/24/2017 IMPRESSION: Small hyperdensity adjacent to the right carotid terminus may represent small amount of blood related to recent procedure. Hyperperfusion felt to be a secondary consideration given the fact that angiogram was performed over 12 hours ago. Patient has not developed CT evidence of a large right middle cerebral artery distribution infarct.   Ct Angio Head/ Neck W Or Wo Contrast Result Date: 10/23/2017 IMPRESSION: CTA NECK: 1. Mild atherosclerosis without hemodynamically significant stenosis or acute vascular process in the neck. 2. Multilevel moderate to severe neural foraminal narrowing. CTA HEAD: 1. Emergent distal RIGHT M1 occlusion. Intermediate collateralization by single-phase CTA. 2. Intracranial atherosclerosis, moderate stenosis LEFT P3 segment.   Dg Chest Port 1 View Result Date: 10/24/2017 IMPRESSION: 1. Cardiomegaly 2. Minimal subsegmental atelectasis or scarring at the left base. Electronically Signed   By: Jasmine Pang M.D.   On: 10/24/2017 00:30   Ct Head Code Stroke Wo Contrast Result Date: 10/23/2017 IMPRESSION: 1. No acute finding. Atrophy and chronic small-vessel ischemic changes of the cerebral hemispheric white matter. 2. ASPECTS is 10.   Echocardiogram:                                              PENDING     IMPRESSION: Kent Nolan is a 84 y.o. male with PMH of Pacemaker, paroxysmal atrial fibrillation not on anticoagulation, presented for evaluation of left-sided weakness. Low NIHSS 2 on admission, reason TPA was not initiated. His neurological exam worsened with worsening dysarthria and more left upper extremity weakness, endovascular team contacted for endovascular thrombectomy.  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 Findings. 1.Occluded RT IMCA M1 occlusion. S/P complete revascularization  With x 2 passes with solitaire 4mm x 40 mm retrieval device achieving a TICI 3 reperfusion. Also used 4.5 mg of IA integrelin.  Outstanding Stroke Work-up Studies:     Echocardiogram:                                                    PENDING  PLAN  10/24/2017: Continue Statin, will likely start ASA 81 mg in AM Frequent neuro checks Telemetry monitoring PT/OT/SLP Consult PM & Rehab Consult Case Management /MSW Ongoing aggressive stroke risk factor management Patient counseled to be compliant with his antithrombotic medications  Patient counseled on Lifestyle modifications including, Diet, Exercise, and Stress Follow up with [facility] in 6 weeks  INTRACRANIAL Atherosclerosis &Stenosis: May consider DAPT prior to discharge  DYSPHAGIA: MBS - Dysphagia 2 (Fine chop) solids;Thin liquid Aspiration Precautions in progress  MEDICAL ISSUES:  ACUTE RESPIRATORY FAILURE: Venti Management per CCM team - Appreciate assistance Extubated today  AFIB, CHRONIC: Will likely start Eliquis in a few days Pacerone in progress for rate control  Heart failure, unspecified -Continue BB  CKD Stage 3 (GFR 30-59) -Gentle hydration -avoid nephrotoxic agents  Anemia Trend Repeat labs in AM  Thrombocytopenia Trend Repeat labs in AM  HYPERTENSION: Stable, Avoid Hypotension SBP goal of < 180. DBP goal of < 105.  Nicardipine drip, Labetolol PRN Long term BP goal normotensive. May slowly restart home B/P medications after 48  hours Home Meds: Pacerone  HYPERLIPIDEMIA:    Component Value Date/Time   CHOL 77 10/24/2017 0335   CHOL 106 12/14/2016 1056   TRIG 74 10/24/2017 0335   HDL 30 (L) 10/24/2017 0335   HDL 36 (L) 12/14/2016 1056   CHOLHDL 2.6 10/24/2017 0335   VLDL 15 10/24/2017 0335   LDLCALC 32 10/24/2017 0335   LDLCALC 45 12/14/2016 1056  Home Meds:  Crestor and Tricor LDL  goal < 70 Continued on Crestor 20 mg daily and Fenofibrate 160 mg Continue statin medications at discharge  DIABETES: Lab Results  Component Value Date   HGBA1C 7.8 (H) 10/24/2017  HgbA1c goal < 7.0 Currently on: Novolog Continue CBG monitoring and SSI to maintain glucose 140-180 mg/dl DM education   Likely Undiagnosed Parkinson's Outpatient Neurology follow up for further testing +tremors and cogwheel on exam  Other Active Problems: Active Problems:   Stroke (cerebrum) (HCC)   Middle cerebral artery embolism, right    Hospital day # 1 VTE prophylaxis: SCD's  Diet : Fall precautions DIET DYS 2 Room service appropriate? Yes; Fluid consistency: Thin   FAMILY UPDATES:  family at bedside  TEAM UPDATES: Micki Riley, MD   Prior Home Stroke Medications:  aspirin 81 mg daily and clopidogrel 75 mg daily  Discharge Stroke Meds:  Please discharge patient on TBD   Disposition: 01-Home or Self Care Therapy Recs:               PENDING Follow Up:  Follow-up Information    Micki Riley, MD. Schedule an appointment as soon as possible for a visit in 6 week(s).   Specialties:  Neurology, Radiology Contact information: [address] [place] [address] [phone]        Corwin Levins, MD. Schedule an appointment as soon as possible for a visit in 1 week(s).   Specialties:  Internal Medicine, Radiology Contact information: [address] [facility] [place] [address] [phone]          Corwin Levins, MD -PCP Follow up in 1-2 weeks      Assessment & plan discussed with with  attending physician and they are in agreement.    Beryl Meager, ANP-C Stroke Neurology Team 10/24/2017 5:03 PM I have personally examined this patient, reviewed notes, independently viewed imaging studies, participated in medical decision making and plan of care.ROS completed by me personally and pertinent positives fully documented  I have made any additions or clarifications directly to the above note. Agree with note above. Patient presented with right M1 occlusion and get a fluctuating exam underwent mechanical thrombectomy with successful revascularization. Recommend strict blood  pressure control and close neurological monitoring as per post intervention protocol. Check repeat brain imaging later today. Check swallow eval and resume home medications. Discussed with wife at the bedside and answered questions. Discussed with Dr. Warren LacyWalter Gray This patient is critically ill and at significant risk of neurological worsening, death and care requires constant monitoring of vital signs, hemodynamics,respiratory and cardiac monitoring, extensive review of multiple databases, frequent neurological assessment, discussion with family, other specialists and medical decision making of high complexity.I have made any additions or clarifications directly to the above note.This critical care time does not reflect procedure time, or teaching time or supervisory time of PA/NP/Med Resident etc but could involve care discussion time.  I spent 30 minutes of neurocritical care time  in the care of  this patient.     Delia HeadyPramod Sethi, MD Medical Director [facility] Pager: [phone] 10/24/2017 5:44 PM  To contact Stroke Continuity provider, please refer to [email or web address]. After hours, contact General Neurology

## 2017-10-24 NOTE — Progress Notes (Signed)
Rehab Admissions Coordinator Note:  Patient was screened by Trish MageLogue, Diarra Kos M for appropriateness for an Inpatient Acute Rehab Consult.  At this time, we are recommending Inpatient Rehab consult.  Trish MageLogue, Davonta Stroot M 10/24/2017, 3:29 PM  I can be reached at [phone].

## 2017-10-24 NOTE — Evaluation (Signed)
Physical Therapy Evaluation Patient Details Name: Kent Nolan MRN: [id number] DOB: March 18, 1929 Today's Date: 10/24/2017   History of Present Illness  This 84 y.o. male admitted after sustaining a fall and Lt sided weakness.  Symptoms improved en route to ED.  CT of head showed possible dense Rt MCA, and CTA showed Rt M1 occlusion.  He underwent embolectomy.   PMH includes bradycardia, CKD, CAD, HNT, pacemaker, PAF, DM  Clinical Impression  Orders received for PT evaluation. Patient demonstrates deficits in functional mobility as indicated below. Will benefit from continued skilled PT to address deficits and maximize function. Will see as indicated and progress as tolerated.  Prior to admission patient independent with activities and self care. At this time, patient requiring 2 person moderate assist for most aspects of functional task performance and mobility.  Given current impairments feel patient would benefit from comprehensive therapies to maximize potential recovery. Will recommend CIR consult at this time.     Follow Up Recommendations CIR    Equipment Recommendations  (TBD)    Recommendations for Other Services Rehab consult     Precautions / Restrictions Precautions Precautions: Fall Restrictions Weight Bearing Restrictions: No      Mobility  Bed Mobility Overal bed mobility: Needs Assistance Bed Mobility: Sit to Supine     Supine to sit: Mod assist     General bed mobility comments: requires assist to bring LEs onto bed  Transfers Overall transfer level: Modified independent   Transfers: Sit to/from [employer] Transfers Sit to Stand: Mod assist;+2 physical assistance;+2 safety/equipment Stand pivot transfers: Mod assist;+2 physical assistance;+2 safety/equipment       General transfer comment: mod assist +2 to power up with posterior bias and right lean, increased time to initiate shuffle steps  Ambulation/Gait             General Gait  Details: unable to perform at this time  Stairs            Wheelchair Mobility    Modified Rankin (Stroke Patients Only) Modified Rankin (Stroke Patients Only) Pre-Morbid Rankin Score: No symptoms Modified Rankin: Severe disability     Balance Overall balance assessment: Needs assistance Sitting-balance support: Feet supported Sitting balance-Leahy Scale: Fair     Standing balance support: Bilateral upper extremity supported Standing balance-Leahy Scale: Poor Standing balance comment: pt requires bil UE support and mod A for balance                              Pertinent Vitals/Pain Pain Assessment: 0-10 Pain Score: 5  Faces Pain Scale: Hurts little more Pain Location: low back (chronic)  Pain Descriptors / Indicators: Restless Pain Intervention(s): Monitored during session;Repositioned;Limited activity within patient's tolerance    Home Living Family/patient expects to be discharged to:: Private residence Living Arrangements: Spouse/significant other Available Help at Discharge: Family;Available 24 hours/day Type of Home: House Home Access: Stairs to enter   [employer] of Steps: 1 at rear entry which is the entrance most used  Home Layout: One level Home Equipment: Environmental consultant - 2 wheels;Walker - 4 wheels;Cane - single point;Shower seat Additional Comments: Pt HOH and dysarthric making it a bit difficult to obtain info accurately.  No family  present during eval     Prior Function Level of Independence: Independent         Comments: Pt reports his wife assists him sometimes with putting on his suspenders, otherwise he indicates he is independent.  He reports he drives sometimes, but mostly wife does the driving.  He prefers tub baths.  He walks with RW when Low back flares up      Hand Dominance   Dominant Hand: Right    Extremity/Trunk Assessment   Upper Extremity Assessment Upper Extremity Assessment: Defer to OT evaluation     Lower Extremity Assessment Lower Extremity Assessment: Generalized weakness;RLE deficits/detail;LLE deficits/detail(Bilateral weakness L slightly greater than right) RLE Deficits / Details: sensation intact, decreased coordination grossly RLE Coordination: decreased gross motor LLE Deficits / Details: sensation intact, decreased coordination grossly LLE Coordination: decreased gross motor    Cervical / Trunk Assessment Cervical / Trunk Assessment: Kyphotic  Communication   Communication: HOH;Expressive difficulties  Cognition Arousal/Alertness: Awake/alert Behavior During Therapy: WFL for tasks assessed/performed Overall Cognitive Status: Impaired/Different from baseline Area of Impairment: Attention;Safety/judgement;Awareness;Problem solving                   Current Attention Level: Sustained     Safety/Judgement: Decreased awareness of safety;Decreased awareness of deficits Awareness: Intellectual Problem Solving: Slow processing;Decreased initiation;Difficulty sequencing;Requires verbal cues;Requires tactile cues General Comments: patient with some decreased initiation during functional movement      General Comments General comments (skin integrity, edema, etc.): VSS    Exercises     Assessment/Plan    PT Assessment Patient needs continued PT services  PT Problem List Decreased strength;Decreased activity tolerance;Decreased balance;Decreased mobility;Decreased coordination;Decreased cognition;Decreased safety awareness       PT Treatment Interventions DME instruction;Gait training;Stair training;Functional mobility training;Therapeutic activities;Therapeutic exercise;Balance training;Neuromuscular re-education;Cognitive remediation;Patient/family education    PT Goals (Current goals can be found in the Care Plan section)  Acute Rehab PT Goals Patient Stated Goal: to go home  PT Goal Formulation: With patient Time For Goal Achievement: 11/07/17 Potential  to Achieve Goals: Good    Frequency Min 4X/week   Barriers to discharge        Co-evaluation               AM-PAC PT "6 Clicks" Daily Activity  Outcome Measure Difficulty turning over in bed (including adjusting bedclothes, sheets and blankets)?: Unable Difficulty moving from lying on back to sitting on the side of the bed? : Unable Difficulty sitting down on and standing up from a chair with arms (e.g., wheelchair, bedside commode, etc,.)?: Unable Help needed moving to and from a bed to chair (including a wheelchair)?: A Lot Help needed walking in hospital room?: A Lot Help needed climbing 3-5 steps with a railing? : A Lot 6 Click Score: 9    End of Session Equipment Utilized During Treatment: Gait belt Activity Tolerance: Patient tolerated treatment well Patient left: in bed;with call bell/phone within reach;with bed alarm set Nurse Communication: Mobility status PT Visit Diagnosis: Unsteadiness on feet (R26.81);Difficulty in walking, not elsewhere classified (R26.2)    Time: [id number] PT Time Calculation (min) (ACUTE ONLY): 22 min   Charges:   PT Evaluation $PT Eval Moderate Complexity: 1 Mod     PT G Codes:        Charlotte Crumbevon Dominick Zertuche, PT DPT  Board Certified Neurologic Specialist [phone]   Fabio AsaDevon J Correy Weidner 10/24/2017, 5:34 PM

## 2017-10-24 NOTE — Progress Notes (Signed)
Echo attempted. Patient with speech. Will try back

## 2017-10-24 NOTE — Progress Notes (Signed)
   10/24/17 1000  Clinical Encounter Type  Visited With Patient and family together  Visit Type Initial  Consult/Referral To Chaplain  Spiritual Encounters  Spiritual Needs Emotional  Stress Factors  Patient Stress Factors Health changes  Family Stress Factors Major life changes  Chaplain visited with the family and the PT.  The PT was sitting up in the bed and smiling, he was very receptive and talking with family.  The Family seemed to be upbeat at PT progress and joyful that the PT has a big smile which lets them know that he is doing better.  PT is a member of a local congregation and part of a church that is expanding.  Family was deeply appreciative for the Chaplain visits.

## 2017-10-25 ENCOUNTER — Inpatient Hospital Stay (HOSPITAL_COMMUNITY): Payer: Medicare HMO

## 2017-10-25 ENCOUNTER — Encounter (HOSPITAL_COMMUNITY): Payer: Self-pay | Admitting: Interventional Radiology

## 2017-10-25 DIAGNOSIS — I340 Nonrheumatic mitral (valve) insufficiency: Secondary | ICD-10-CM

## 2017-10-25 DIAGNOSIS — I6601 Occlusion and stenosis of right middle cerebral artery: Secondary | ICD-10-CM

## 2017-10-25 DIAGNOSIS — I69398 Other sequelae of cerebral infarction: Secondary | ICD-10-CM

## 2017-10-25 DIAGNOSIS — R269 Unspecified abnormalities of gait and mobility: Secondary | ICD-10-CM

## 2017-10-25 DIAGNOSIS — I361 Nonrheumatic tricuspid (valve) insufficiency: Secondary | ICD-10-CM

## 2017-10-25 DIAGNOSIS — I69319 Unspecified symptoms and signs involving cognitive functions following cerebral infarction: Secondary | ICD-10-CM

## 2017-10-25 LAB — ECHOCARDIOGRAM COMPLETE
CHL CUP DOP CALC LVOT VTI: 15.8 cm
CHL CUP MV DEC (S): 232
E/e' ratio: 9.95
EWDT: 232 ms
FS: 28 % (ref 28–44)
Height: 67 in
IV/PV OW: 0.94
LA diam index: 2 cm/m2
LA vol A4C: 49.9 ml
LA vol index: 28.1 mL/m2
LA vol: 54.7 mL
LASIZE: 39 mm
LDCA: 3.8 cm2
LEFT ATRIUM END SYS DIAM: 39 mm
LV E/e'average: 9.95
LV PW d: 11.2 mm — AB (ref 0.6–1.1)
LV TDI E'MEDIAL: 4.73
LV dias vol index: 47 mL/m2
LV sys vol: 56 mL (ref 21–61)
LVDIAVOL: 91 mL (ref 62–150)
LVEEMED: 9.95
LVELAT: 7.99 cm/s
LVOT SV: 60 mL
LVOT diameter: 22 mm
LVOT peak vel: 75.4 cm/s
LVSYSVOLIN: 29 mL/m2
Lateral S' vel: 13.6 cm/s
MRPISAEROA: 0.12 cm2
MV VTI: 180 cm
MV pk E vel: 79.5 m/s
MVPG: 3 mmHg
MVPKAVEL: 66 m/s
RV TAPSE: 22.8 mm
RV sys press: 29 mmHg
Reg peak vel: 257 cm/s
Simpson's disk: 39
Stroke v: 35 ml
TDI e' lateral: 7.99
TRMAXVEL: 257 cm/s
Weight: 2945.35 oz

## 2017-10-25 LAB — GLUCOSE, CAPILLARY
GLUCOSE-CAPILLARY: 109 mg/dL — AB (ref 65–99)
GLUCOSE-CAPILLARY: 198 mg/dL — AB (ref 65–99)
GLUCOSE-CAPILLARY: 224 mg/dL — AB (ref 65–99)
Glucose-Capillary: 138 mg/dL — ABNORMAL HIGH (ref 65–99)

## 2017-10-25 LAB — BASIC METABOLIC PANEL
Anion gap: 11 (ref 5–15)
BUN: 24 mg/dL — AB (ref 6–20)
CALCIUM: 8.7 mg/dL — AB (ref 8.9–10.3)
CO2: 22 mmol/L (ref 22–32)
CREATININE: 1.7 mg/dL — AB (ref 0.61–1.24)
Chloride: 106 mmol/L (ref 101–111)
GFR calc non Af Amer: 35 mL/min — ABNORMAL LOW (ref >60)
GFR, EST AFRICAN AMERICAN: 40 mL/min — AB (ref >60)
Glucose, Bld: 168 mg/dL — ABNORMAL HIGH (ref 65–99)
Potassium: 4.2 mmol/L (ref 3.5–5.1)
SODIUM: 139 mmol/L (ref 135–145)

## 2017-10-25 LAB — CBC
HCT: 35 % — ABNORMAL LOW (ref 39.0–52.0)
Hemoglobin: 11.2 g/dL — ABNORMAL LOW (ref 13.0–17.0)
MCH: 30.9 pg (ref 26.0–34.0)
MCHC: 32 g/dL (ref 30.0–36.0)
MCV: 96.4 fL (ref 78.0–100.0)
Platelets: 148 10*3/uL — ABNORMAL LOW (ref 150–400)
RBC: 3.63 MIL/uL — ABNORMAL LOW (ref 4.22–5.81)
RDW: 13.9 % (ref 11.5–15.5)
WBC: 10.3 10*3/uL (ref 4.0–10.5)

## 2017-10-25 LAB — PHOSPHORUS: PHOSPHORUS: 2.7 mg/dL (ref 2.5–4.6)

## 2017-10-25 LAB — MAGNESIUM: Magnesium: 1.9 mg/dL (ref 1.7–2.4)

## 2017-10-25 MED ORDER — APIXABAN 2.5 MG PO TABS
2.5000 mg | ORAL_TABLET | Freq: Two times a day (BID) | ORAL | Status: DC
  Administered 2017-10-25 – 2017-10-28 (×7): 2.5 mg via ORAL
  Filled 2017-10-25 (×8): qty 1

## 2017-10-25 MED ORDER — COLLAGENASE 250 UNIT/GM EX OINT
TOPICAL_OINTMENT | Freq: Every day | CUTANEOUS | Status: DC
  Administered 2017-10-25 – 2017-10-28 (×4): via TOPICAL
  Filled 2017-10-25 (×3): qty 30

## 2017-10-25 MED ORDER — ENSURE ENLIVE PO LIQD
237.0000 mL | Freq: Two times a day (BID) | ORAL | Status: DC
  Administered 2017-10-25 – 2017-10-27 (×3): 237 mL via ORAL

## 2017-10-25 MED ORDER — ASPIRIN EC 81 MG PO TBEC
81.0000 mg | DELAYED_RELEASE_TABLET | Freq: Every day | ORAL | Status: DC
  Administered 2017-10-25 – 2017-10-28 (×4): 81 mg via ORAL
  Filled 2017-10-25 (×4): qty 1

## 2017-10-25 NOTE — Progress Notes (Signed)
  Echocardiogram 2D Echocardiogram has been performed.  Kent Nolan, [name] 10/25/2017, 10:39 AM

## 2017-10-25 NOTE — Progress Notes (Signed)
ANTICOAGULATION CONSULT NOTE  Pharmacy Consult for apixaban Indication: atrial fibrillation  Labs: Recent Labs    10/23/17 1618 10/23/17 1631 10/24/17 0335 10/25/17 0410  HGB 13.7 14.3 12.2* 11.2*  HCT 42.2 42.0 36.9* 35.0*  PLT 147*  --  140* 148*  APTT 27  --   --   --   LABPROT 13.5  --   --   --   INR 1.04  --   --   --   CREATININE 1.88* 1.70* 1.59* 1.70*    Assessment: 88 yom with hx of PAF not on anticoagulation PTA (on DAPT) admitted with stroke, no TPA due to low NIHSS. S/p re-vascularization in IR on 2/18. Pharmacy consulted to start apixaban for afib. Hg down to 11.2, plt low at 148 but stable. No bleed documented. SCr back up to 1.7. Previously on heparin SQ this admit - last dose was 2/19 AM.  Goal of Therapy:  Stroke prevention Monitor platelets by anticoagulation protocol: Yes   Plan:  Start apixaban 2.5mg  PO BID (age>80, SCr>1.5) Monitor CBC, s/sx bleeding  Babs BertinHaley Naoko Diperna, PharmD, BCPS Clinical Pharmacist 10/25/2017 8:55 AM

## 2017-10-25 NOTE — Consult Note (Signed)
WOC Nurse wound consult note Reason for Consult:right groin wound s/p arteriogram, sheath right groin Wound type: surgical wound Pressure Injury POA:NA Measurement:4cm x 1cm x 0cm  Wound bed: 100% yellow Drainage (amount, consistency, odor) yellow/brown, no odor Periwound: intact, no erythema  Dressing procedure/placement/frequency: Enzymatic debridement ointment to the right groin wound, top saline moist gauze. Top with dry dressing, secure with tape. Change daily.    Re consult if needed, will not follow at this time. Thanks  Belford Pascucci M.D.[employer] MSN, RN,CWOCN, CNS, CWON-AP [phone])

## 2017-10-25 NOTE — Progress Notes (Signed)
SLP Cancellation Note  Patient Details Name: Kent Nolan MRN: [id number] DOB: [birth year]   Cancelled treatment:       Reason Eval/Treat Not Completed: Patient at procedure or test/unavailable  Ferdinand LangoLeah Mackena Plummer MA, CCC-SLP [phone]  Kent Nolan 10/25/2017, 10:28 AM

## 2017-10-25 NOTE — Progress Notes (Addendum)
NEUROHOSPITALISTS STROKE TEAM - DAILY PROGRESS NOTE   ADMISSION HISTORY: Kent Nolan is a 84 y.o. male past medical history of paroxysmal atrial fibrillation not on anticoagulation currently on dual antiplatelets, bradycardia status post pacemaker 2011, chronic kidney disease, hypertension, coronary artery disease, hyperlipidemia, diabetes, prior best report by EMS last seen normal at 3:10 PM today 10/23/2017, was noted by the wife to have witnessed a fall as he was trying to get out of the chair and go to use the bathroom. EMS was called.  Upon EMS initial assessment, there was some left-sided weakness that started to improve.  They made him walk which presumably worsened his symptoms.  En route, he started becoming better with only some dysarthria and left facial droop, which I also found on my exam. No preceding sickness or illness.  No chest pain shortness of breath.  No nausea vomiting.  No visual symptoms.  No headaches. No history of hitting his head on the floor during the fall. LKW: 3:10 PM on 10/23/2017 tpa given?: no, low NIH. Premorbid modified Rankin scale (mRS): 2 Initial NIH on arrival at the ER bridge-2-1 for dysarthria 1 for facial. Repeat NIH exam probably worse- 2 for dysarthria, may be 1 for the left arm, and 1 left face.  SUBJECTIVE (INTERVAL HISTORY) No family is at the bedside. Patient is found laying in bed in NAD. Overall he feel his condition is gradually improving. Voices no new complaints. No new events reported overnight.   OBJECTIVE Lab Results: CBC:  Recent Labs  Lab 10/23/17 1618 10/23/17 1631 10/24/17 0335 10/25/17 0410  WBC 4.4  --  9.0 10.3  HGB 13.7 14.3 12.2* 11.2*  HCT 42.2 42.0 36.9* 35.0*  MCV 97.9  --  95.8 96.4  PLT 147*  --  140* 148*   BMP: Recent Labs  Lab 10/23/17 1618 10/23/17 1631 10/24/17 0335 10/25/17 0410  NA 136 139 136 139  K 3.8 3.8 4.0 4.2  CL 104 102 106 106  CO2  22  --  21* 22  GLUCOSE 266* 256* 236* 168*  BUN 26* 28* 23* 24*  CREATININE 1.88* 1.70* 1.59* 1.70*  CALCIUM 9.0  --  8.5* 8.7*  MG  --   --   --  1.9  PHOS  --   --   --  2.7   Liver Function Tests:  Recent Labs  Lab 10/23/17 1618  AST 28  ALT 16*  ALKPHOS 69  BILITOT 0.7  PROT 6.2*  ALBUMIN 3.4*   Coagulation Studies:  Recent Labs    10/23/17 1618  APTT 27  INR 1.04   Microbiology:   PHYSICAL EXAM Temp:  [97.6 F (36.4 C)-98.5 F (36.9 C)] 98.3 F (36.8 C) (02/20 0800) Pulse Rate:  [61-80] 61 (02/20 1100) Resp:  [12-23] 21 (02/20 1100) BP: (81-141)/(54-97) 133/62 (02/20 1100) SpO2:  [90 %-100 %] 99 % (02/20 1100) Arterial Line BP: (114-164)/(53-71) 138/63 (02/20 0300) General - Well nourished, well developed, in no apparent distress HEENT-  Normocephalic,    Cardiovascular - Regular rate and rhythm  Respiratory - Lungs clear bilaterally. No wheezing. Abdomen - soft and non-tender, BS normal Extremities- no edema or cyanosis Patient is awake alert oriented x3. His speech is mildly dysarthric.  Naming, attention and repetition intact. Cranial nerves: Pupils equal round reactive to light, extra ocular movements intact, visual fields full, mild left lower facial weakness, facial sensation intact bilaterally, auditory acuity grossly reduced even with hearing aids in place, palate elevates  symmetrically, shoulder shrug intact, tongue midline. Motor exam:+ drift on Left. Mild left grip weakness. Diminished fine finger movements on the left. Orbits right over left approximately. Mild pill-rolling tremor at rest in the left hand. Cogwheel rigidity bilaterally. Positive glabellar tap. Diminished facial expression. Sensory exam: No decreased sensation.  No extinction. Coordination: not tested DTRs: Absent in both ankles, trace DTRs in both knees.  Trace DTRs in both biceps and triceps. Gait testing was deferred at this time  IMAGING: I have personally reviewed the  radiological images below and agree with the radiology interpretations.  Ct Head Wo Contrast Result Date: 10/24/2017 IMPRESSION: Small hyperdensity adjacent to the right carotid terminus may represent small amount of blood related to recent procedure. Hyperperfusion felt to be a secondary consideration given the fact that angiogram was performed over 12 hours ago. Patient has not developed CT evidence of a large right middle cerebral artery distribution infarct.   Ct Angio Head/ Neck W Or Wo Contrast Result Date: 10/23/2017 IMPRESSION: CTA NECK: 1. Mild atherosclerosis without hemodynamically significant stenosis or acute vascular process in the neck. 2. Multilevel moderate to severe neural foraminal narrowing. CTA HEAD: 1. Emergent distal RIGHT M1 occlusion. Intermediate collateralization by single-phase CTA. 2. Intracranial atherosclerosis, moderate stenosis LEFT P3 segment.   Dg Chest Port 1 View Result Date: 10/24/2017 IMPRESSION: 1. Cardiomegaly 2. Minimal subsegmental atelectasis or scarring at the left base. Electronically Signed   By: Jasmine Pang M.D.   On: 10/24/2017 00:30   Ct Head Code Stroke Wo Contrast Result Date: 10/23/2017 IMPRESSION: 1. No acute finding. Atrophy and chronic small-vessel ischemic changes of the cerebral hemispheric white matter. 2. ASPECTS is 10.   Echocardiogram:     Completed and results pending                                              IMPRESSION: [name] is a 84 y.o. male with PMH of Pacemaker, paroxysmal atrial fibrillation not on anticoagulation, presented for evaluation of left-sided weakness. Low NIHSS 2 on admission, reason TPA was not initiated. His neurological exam worsened with worsening dysarthria and more left upper extremity weakness, endovascular team contacted for endovascular thrombectomy.  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 Findings. 1.Occluded RT IMCA M1 occlusion. S/P complete revascularization  With x 2 passes with solitaire 4mm x 40 mm retrieval device achieving a TICI 3 reperfusion. Also used 4.5 mg of IA integrelin.  Outstanding Stroke Work-up Studies:    Work up complete  PLAN  10/25/2017: Continue Statin/ASA/ Eliquis Frequent neuro checks Telemetry monitoring PT/OT/SLP Consult PM & Rehab Consult Case Management /MSW Ongoing aggressive stroke risk factor management Patient counseled to be compliant with his antithrombotic medications Patient counseled on Lifestyle modifications including, Diet, Exercise, and Stress Follow up with [facility] in 6 weeks  INTRACRANIAL Atherosclerosis &Stenosis: May consider DAPT prior to discharge  DYSPHAGIA: MBS - Dysphagia 2 (Fine chop)  solids;Thin liquid Aspiration Precautions in progress  MEDICAL ISSUES:  ACUTE RESPIRATORY FAILURE: Venti Management per CCM team - Appreciate assistance Extubated  On room air with no reports of SOB      AFIB, CHRONIC: Start Eliquis today per pharmacy Pacerone in progress for rate control  Heart failure, unspecified -Continue BB No SOB documented  CKD Stage 3 (GFR 30-59) -Continue Gentle hydration -avoid nephrotoxic agents Repeat labs in AM  Anemia Trend - stable Repeat labs in AM  Thrombocytopenia Trending up Repeat labs in AM  HYPERTENSION: Stable, Avoid Hypotension SBP goal of < 180. DBP goal of < 105.  Labetolol PRN Long term BP  goal normotensive. Home Meds: Pacerone  HYPERLIPIDEMIA:    Component Value Date/Time   CHOL 77 10/24/2017 0335   CHOL 106 12/14/2016 1056   TRIG 74 10/24/2017 0335   HDL 30 (L) 10/24/2017 0335   HDL 36 (L) 12/14/2016 1056   CHOLHDL 2.6 10/24/2017 0335   VLDL 15 10/24/2017 0335   LDLCALC 32 10/24/2017 0335   LDLCALC 45 12/14/2016 1056  Home Meds:  Crestor and Tricor LDL  goal < 70 Continued on Crestor 20 mg daily and Fenofibrate 160 mg Continue statin medications at discharge  DIABETES: Lab Results  Component Value Date   HGBA1C 7.8 (H) 10/24/2017  HgbA1c goal < 7.0 Currently on: Novolog Continue CBG monitoring and SSI to maintain glucose 140-180 mg/dl DM education   Likely Undiagnosed Parkinson's Outpatient Neurology follow up for further testing +tremors and cogwheel on exam  Other Active Problems: Active Problems:   Stroke (cerebrum) (HCC)   Middle cerebral artery embolism, right    Hospital day # 2 VTE prophylaxis: SCD's  Diet : Fall precautions DIET DYS 2 Room service appropriate? Yes; Fluid consistency: Thin   FAMILY UPDATES:  No family at bedside  TEAM UPDATES: Micki RileySethi, Lucia Mccreadie S, MD   Prior Home Stroke Medications:  aspirin 81 mg daily and clopidogrel 75 mg daily  Discharge Stroke Meds:  Please discharge patient on ASA 81 mg daily and Eliquis 2.5 mg BID  Disposition: 01-Home or Self Care Therapy Recs:               CIR, Hopefully in AM Follow Up:  Follow-up Information    Micki RileySethi, Nitasha Jewel S, MD. Schedule an appointment as soon as possible for a visit in 6 week(s).   Specialties:  Neurology, Radiology Contact information: [address] [place] [address] [phone]        Corwin LevinsJohn, James W, MD. Schedule an appointment as soon as possible for a visit in 1 week(s).   Specialties:  Internal Medicine, Radiology Contact information: [address] [facility] [address] [place] [address] [phone]          Corwin LevinsJohn, James W, MD -PCP Follow up in  1-2 weeks      Assessment & plan discussed with with attending physician and they are in agreement.    Brita RompMary A Costello, ANP-C Stroke Neurology Team 10/25/2017 11:48 AM   Attending Note   10/24/2017 Patient presented with right M1 occlusion and get a fluctuating exam underwent mechanical thrombectomy with successful revascularization. Recommend strict blood pressure control and close neurological monitoring as per post intervention protocol. Check repeat brain imaging later today. Check swallow eval and resume home medications. Discussed with wife at the bedside and answered questions. Discussed with Dr. Warren LacyWalter Gray  Attending Note 10/25/2017 Patient remains neurologically stable. He has persistent dysphagia and left hemiparesis. Plan mobilize out of bed  today. Therapy consults. Check swallow eval. Resume home medications. Start eliquis for anticoagulation and discontinue Plavix. Discussed with daughter at the bedside and answered questions. Discussed with Dr. Warren Lacy.  Transfer to floor today and  rehabilitation later This patient is critically ill and at significant risk of neurological worsening, death and care requires constant monitoring of vital signs, hemodynamics,respiratory and cardiac monitoring, extensive review of multiple databases, frequent neurological assessment, discussion with family, other specialists and medical decision making of high complexity.I have made any additions or clarifications directly to the above note.This critical care time does not reflect procedure time, or teaching time or supervisory time of PA/NP/Med Resident etc but could involve care discussion time.  I spent 30 minutes of neurocritical care time  in the care of  this patient.   Delia Heady, MD  [facility] [address] [place], [place] [address]  Phone [phone] Fax [phone]     To contact Stroke Continuity provider, please refer to [email or web address]. After  hours, contact General Neurology

## 2017-10-26 LAB — BASIC METABOLIC PANEL
Anion gap: 9 (ref 5–15)
BUN: 23 mg/dL — ABNORMAL HIGH (ref 6–20)
CALCIUM: 8.6 mg/dL — AB (ref 8.9–10.3)
CHLORIDE: 105 mmol/L (ref 101–111)
CO2: 24 mmol/L (ref 22–32)
Creatinine, Ser: 1.51 mg/dL — ABNORMAL HIGH (ref 0.61–1.24)
GFR calc non Af Amer: 40 mL/min — ABNORMAL LOW (ref >60)
GFR, EST AFRICAN AMERICAN: 46 mL/min — AB (ref >60)
Glucose, Bld: 131 mg/dL — ABNORMAL HIGH (ref 65–99)
Potassium: 3.8 mmol/L (ref 3.5–5.1)
SODIUM: 138 mmol/L (ref 135–145)

## 2017-10-26 LAB — CBC
HCT: 35.3 % — ABNORMAL LOW (ref 39.0–52.0)
HEMOGLOBIN: 11.3 g/dL — AB (ref 13.0–17.0)
MCH: 31.4 pg (ref 26.0–34.0)
MCHC: 32 g/dL (ref 30.0–36.0)
MCV: 98.1 fL (ref 78.0–100.0)
Platelets: 142 10*3/uL — ABNORMAL LOW (ref 150–400)
RBC: 3.6 MIL/uL — ABNORMAL LOW (ref 4.22–5.81)
RDW: 13.8 % (ref 11.5–15.5)
WBC: 8.1 10*3/uL (ref 4.0–10.5)

## 2017-10-26 LAB — GLUCOSE, CAPILLARY
GLUCOSE-CAPILLARY: 138 mg/dL — AB (ref 65–99)
Glucose-Capillary: 136 mg/dL — ABNORMAL HIGH (ref 65–99)
Glucose-Capillary: 158 mg/dL — ABNORMAL HIGH (ref 65–99)
Glucose-Capillary: 126 mg/dL — ABNORMAL HIGH (ref 65–99)

## 2017-10-26 NOTE — Progress Notes (Signed)
Physical Therapy Treatment Patient Details Name: Kent GammaWilliam P Nolan MRN: [id number] DOB: [birth year] Today's Date: 10/26/2017    History of Present Illness This 84 y.o. male admitted after sustaining a fall and Lt sided weakness.  Symptoms improved en route to ED.  CT of head showed possible dense Rt MCA, and CTA showed Rt M1 occlusion.  He underwent embolectomy.   PMH includes bradycardia, CKD, CAD, HNT, pacemaker, PAF, DM    PT Comments    Pt very pleasant and moving exceptionally well today with ability to ambulate with use of RW. Pt with cues for safety and function. Pt fatigues end of session and will benefit from continued therapy to progress to mod I. Pt encouraged to mobilize with nursing daily.   Follow Up Recommendations  Supervision/Assistance - 24 hour;CIR     Equipment Recommendations       Recommendations for Other Services       Precautions / Restrictions Precautions Precautions: Fall    Mobility  Bed Mobility Overal bed mobility: Needs Assistance Bed Mobility: Supine to Sit     Supine to sit: Min assist     General bed mobility comments: min assist with cues for sequence with increased time and HOB 20 degrees, assist to fully elevate trunk  Transfers Overall transfer level: Needs assistance   Transfers: Sit to/from Stand Sit to Stand: Min guard         General transfer comment: cues for hand placement and safety with increased time  Ambulation/Gait Ambulation/Gait assistance: Min assist Ambulation Distance (Feet): 300 Feet Assistive device: Rolling walker (2 wheeled) Gait Pattern/deviations: Step-through pattern;Decreased stride length   Gait velocity interpretation: Below normal speed for age/gender General Gait Details: pt with good use of RW with cues for posture and position in RW. Pt with fatigue stepping posterior to RW and needing increased assist for stability with chair to follow.    Stairs            Wheelchair Mobility     Modified Rankin (Stroke Patients Only) Modified Rankin (Stroke Patients Only) Pre-Morbid Rankin Score: No symptoms Modified Rankin: Moderately severe disability     Balance Overall balance assessment: Needs assistance   Sitting balance-Leahy Scale: Good       Standing balance-Leahy Scale: Poor                              Cognition Arousal/Alertness: Awake/alert Behavior During Therapy: WFL for tasks assessed/performed Overall Cognitive Status: Impaired/Different from baseline                     Current Attention Level: Selective     Safety/Judgement: Decreased awareness of deficits Awareness: Emergent Problem Solving: Requires verbal cues        Exercises General Exercises - Lower Extremity Long Arc Quad: AROM;15 reps;Seated;Both Hip Flexion/Marching: AROM;15 reps;Seated;Both    General Comments        Pertinent Vitals/Pain Pain Assessment: No/denies pain    Home Living                      Prior Function            PT Goals (current goals can now be found in the care plan section) Progress towards PT goals: Progressing toward goals    Frequency           PT Plan Current plan remains appropriate    Co-evaluation  AM-PAC PT "6 Clicks" Daily Activity  Outcome Measure  Difficulty turning over in bed (including adjusting bedclothes, sheets and blankets)?: A Lot Difficulty moving from lying on back to sitting on the side of the bed? : A Lot Difficulty sitting down on and standing up from a chair with arms (e.g., wheelchair, bedside commode, etc,.)?: A Little Help needed moving to and from a bed to chair (including a wheelchair)?: A Little Help needed walking in hospital room?: A Little Help needed climbing 3-5 steps with a railing? : A Lot 6 Click Score: 15    End of Session Equipment Utilized During Treatment: Gait belt Activity Tolerance: Patient tolerated treatment well Patient left: in  chair;with call bell/phone within reach;with chair alarm set Nurse Communication: Mobility status PT Visit Diagnosis: Other abnormalities of gait and mobility (R26.89);Unsteadiness on feet (R26.81)     Time: 1610-9604 PT Time Calculation (min) (ACUTE ONLY): 24 min  Charges:  $Gait Training: 8-22 mins $Therapeutic Activity: 8-22 mins                    G Codes:       Delaney Meigs, PT [phone]    Enedina Finner Cesilia Shinn 10/26/2017, 12:27 PM

## 2017-10-26 NOTE — Progress Notes (Signed)
  Speech Language Pathology Treatment: Dysphagia;Cognitive-Linquistic  Patient Details Name: Kent Nolan MRN: [id number] DOB: December 29, 1928 Today's Date: 10/26/2017 Time: [id number] SLP Time Calculation (min) (ACUTE ONLY): 18 min  Assessment / Plan / Recommendation Clinical Impression  Treatment focused on dysphagia and cognitive goals. Max verbal cueing provided for intellectual awareness of swallowing deficits, previous swallowing test results, and compensatory strategies previously taught. SLP then provided moderate-max verbal, visual, and tactile cueing for carryover of compensatory strategies into functional ADL, self feeding thin liquids. Patient able to consume with one time cough response indicative of decreased airway protection, noted with multiple large consecutive boluses. No further evidence of aspiration noted with max SLP cueing for small controlled boluses via straw to facilitate oral clearance and positioning for chin tuck. Vocal quality remaining clear. Patient able to consume trials of soft solids (baseline diet per patient due to poor fitting dentures) with dentures in place with timely and full oral clearance. Vital signs point towards tolerance of diet thus far. Patient will continue to benefit from SLP treatment for diet tolerance, carryover of compensatory strategies to minimize aspiration aspiration risk, awareness of deficits for safe carryout of ADLs, and dysarthria.    HPI HPI: Pt is an 84 year old man who presents with left-sided weakness, found to have a R M1 occlusion s/p revascularization. PMH also includes afib, bradycardia s/p pacemaker, CKD, HTN, CAD, HLD, DM, GERD      SLP Plan  Continue with current plan of care       Recommendations  Diet recommendations: Dysphagia 3 (mechanical soft);Thin liquid Liquids provided via: Straw;Cup Medication Administration: Crushed with puree Supervision: Patient able to self feed;Full supervision/cueing for compensatory  strategies Compensations: Minimize environmental distractions;Slow rate;Small sips/bites;Chin tuck;Other (Comment)(intermittent cough) Postural Changes and/or Swallow Maneuvers: Seated upright 90 degrees;Chin tuck                Oral Care Recommendations: Oral care BID Follow up Recommendations: Inpatient Rehab SLP Visit Diagnosis: Dysphagia, oropharyngeal phase (R13.12) Plan: Continue with current plan of care                    [facility] Barnet Benavides MA, CCC-SLP [phone]    Kent Nolan 10/26/2017, 11:26 AM

## 2017-10-26 NOTE — Progress Notes (Signed)
Occupational Therapy Treatment Patient Details Name: Kent Nolan MRN: [id number] DOB: May 19, 1929 Today's Date: 10/26/2017    History of present illness This 84 y.o. male admitted after sustaining a fall and Lt sided weakness.  Symptoms improved en route to ED.  CT of head showed possible dense Rt MCA, and CTA showed Rt M1 occlusion.  He underwent embolectomy.   PMH includes bradycardia, CKD, CAD, HNT, pacemaker, PAF, DM   OT comments  Pt is making good progress toward goals. He is able to perform ADLs with min A - mod A.   He requires increased time to complete activities and requires cues for safety and problem solving.  He is at high risk for falls and will benefit from CIR to allow him to maximize safety and independence with ADLs to prevent falls, injury, and readmission.  Will follow.   Follow Up Recommendations  CIR    Equipment Recommendations  3 in 1 bedside commode    Recommendations for Other Services Rehab consult    Precautions / Restrictions Precautions Precautions: Fall       Mobility Bed Mobility Overal bed mobility: Needs Assistance Bed Mobility: Supine to Sit     Supine to sit: Min assist     General bed mobility comments: up in chair   Transfers Overall transfer level: Needs assistance   Transfers: Sit to/from Stand Sit to Stand: Min guard Stand pivot transfers: Min assist       General transfer comment: assist to steady during turns     Balance Overall balance assessment: Needs assistance   Sitting balance-Leahy Scale: Good     Standing balance support: Bilateral upper extremity supported Standing balance-Leahy Scale: Poor                             ADL either performed or assessed with clinical judgement   ADL Overall ADL's : Needs assistance/impaired Eating/Feeding: Set up;Sitting   Grooming: Wash/dry hands;Wash/dry face;Oral care;Min guard;Standing Grooming Details (indicate cue type and reason): requires verbal  cues to locate wash cloths on his Rt              Lower Body Dressing: Moderate assistance;Sit to/from stand Lower Body Dressing Details (indicate cue type and reason): Pt demonstrates difficulty pulling socks over toes  Toilet Transfer: Minimal assistance;Ambulation;Comfort height toilet;Grab bars;RW   Toileting- Clothing Manipulation and Hygiene: Moderate assistance;Sit to/from stand       Functional mobility during ADLs: Minimal assistance;Rolling walker       Vision   Additional Comments: Pt required min cues to locate washcloth placed on his Rt side    Perception     Praxis      Cognition Arousal/Alertness: Awake/alert Behavior During Therapy: WFL for tasks assessed/performed Overall Cognitive Status: Impaired/Different from baseline Area of Impairment: Attention;Safety/judgement;Following commands;Problem solving;Awareness                   Current Attention Level: Selective   Following Commands: Follows one step commands consistently;Follows multi-step commands inconsistently Safety/Judgement: Decreased awareness of safety;Decreased awareness of deficits Awareness: Intellectual Problem Solving: Difficulty sequencing;Requires verbal cues;Requires tactile cues General Comments: Pt slow to process info.  Min - mod cues for problem solving during familiar activiites         Exercises General Exercises - Lower Extremity Long Arc Quad: AROM;15 reps;Seated;Both Hip Flexion/Marching: AROM;15 reps;Seated;Both   Shoulder Instructions       General Comments wife present  Pertinent Vitals/ Pain       Pain Assessment: No/denies pain  Home Living                                          Prior Functioning/Environment              Frequency  Min 2X/week        Progress Toward Goals  OT Goals(current goals can now be found in the care plan section)  Progress towards OT goals: Progressing toward goals     Plan Discharge  plan remains appropriate    Co-evaluation                 AM-PAC PT "6 Clicks" Daily Activity     Outcome Measure   Help from another person eating meals?: A Little Help from another person taking care of personal grooming?: A Little Help from another person toileting, which includes using toliet, bedpan, or urinal?: A Lot Help from another person bathing (including washing, rinsing, drying)?: A Lot Help from another person to put on and taking off regular upper body clothing?: A Lot Help from another person to put on and taking off regular lower body clothing?: A Lot 6 Click Score: 14    End of Session    OT Visit Diagnosis: Unsteadiness on feet (R26.81)   Activity Tolerance     Patient Left     Nurse Communication          Time: [id number] OT Time Calculation (min): 17 min  Charges: OT General Charges $OT Visit: 1 Visit OT Treatments $Self Care/Home Management : 8-22 mins  Reynolds AmericanWendi Willmer Fellers, OTR/L [phone]    Jeani HawkingConarpe, Willean Schurman M 10/26/2017, 2:52 PM

## 2017-10-26 NOTE — Progress Notes (Signed)
Pt transferred to 3w10. [facility]. Assessment stable. Dentures and hearing aides with patient.

## 2017-10-26 NOTE — Progress Notes (Signed)
Rehab admissions - I opened this case with [employer] yesterday at 10:00 am.  I await a decision from insurance case manager/medical director about potential inpatient rehab admission.  I met with patient and his wife today at the bedside.  Wife would like inpatient rehab admission prior to home.  I will update all once I hear back from [employer].  Call me for questions.  #[phone] 

## 2017-10-26 NOTE — Progress Notes (Addendum)
NEUROHOSPITALISTS STROKE TEAM - DAILY PROGRESS NOTE   ADMISSION HISTORY: Kent Nolan is a 84 y.o. male past medical history of paroxysmal atrial fibrillation not on anticoagulation currently on dual antiplatelets, bradycardia status post pacemaker 2011, chronic kidney disease, hypertension, coronary artery disease, hyperlipidemia, diabetes, prior best report by EMS last seen normal at 3:10 PM today 10/23/2017, was noted by the wife to have witnessed a fall as he was trying to get out of the chair and go to use the bathroom. EMS was called.  Upon EMS initial assessment, there was some left-sided weakness that started to improve.  They made him walk which presumably worsened his symptoms.  En route, he started becoming better with only some dysarthria and left facial droop, which I also found on my exam. No preceding sickness or illness.  No chest pain shortness of breath.  No nausea vomiting.  No visual symptoms.  No headaches. No history of hitting his head on the floor during the fall. LKW: 3:10 PM on 10/23/2017 tpa given?: no, low NIH. Premorbid modified Rankin scale (mRS): 2 Initial NIH on arrival at the ER bridge-2-1 for dysarthria 1 for facial. Repeat NIH exam probably worse- 2 for dysarthria, may be 1 for the left arm, and 1 left face.  SUBJECTIVE (INTERVAL HISTORY) Wife is at the bedside. Patient is found sitting in chair in NAD. Overall he feel his condition is gradually improving. Voices no new complaints. Has been walking in hallways with PT this AM. No new events reported overnight. Awaiting insurance approval for CIR. Patient lives with wife alone. Not much family or friends support. Would be difficult for wife to manage patient alone at this point. Wife encouraged to make arrangements now with 2 daughter who live locally for when he returns home.   OBJECTIVE Lab Results: CBC:  Recent Labs  Lab 10/24/17 0335 10/25/17 0410  10/26/17 0339  WBC 9.0 10.3 8.1  HGB 12.2* 11.2* 11.3*  HCT 36.9* 35.0* 35.3*  MCV 95.8 96.4 98.1  PLT 140* 148* 142*   BMP: Recent Labs  Lab 10/23/17 1618 10/23/17 1631 10/24/17 0335 10/25/17 0410 10/26/17 0339  NA 136 139 136 139 138  K 3.8 3.8 4.0 4.2 3.8  CL 104 102 106 106 105  CO2 22  --  21* 22 24  GLUCOSE 266* 256* 236* 168* 131*  BUN 26* 28* 23* 24* 23*  CREATININE 1.88* 1.70* 1.59* 1.70* 1.51*  CALCIUM 9.0  --  8.5* 8.7* 8.6*  MG  --   --   --  1.9  --   PHOS  --   --   --  2.7  --    Liver Function Tests:  Recent Labs  Lab 10/23/17 1618  AST 28  ALT 16*  ALKPHOS 69  BILITOT 0.7  PROT 6.2*  ALBUMIN 3.4*   Coagulation Studies:  Recent Labs    10/23/17 1618  APTT 27  INR 1.04   Microbiology:   PHYSICAL EXAM Temp:  [97.8 F (36.6 C)-98.3 F (36.8 C)] 97.8 F (36.6 C) (02/21 0800) Pulse Rate:  [59-78] 67 (02/21 1000) Resp:  [12-24] 16 (02/21 1000) BP: (105-150)/(52-91) 148/74 (02/21 1000) SpO2:  [93 %-99 %] 96 % (02/21 1000) General - Well nourished, well developed, in no apparent distress HEENT-  Normocephalic,    Cardiovascular - Regular rate and rhythm  Respiratory - Lungs clear bilaterally. No wheezing. Abdomen - soft and non-tender, BS normal Extremities- no edema or cyanosis Patient is awake alert  oriented x3. His speech is mildly dysarthric. Improving slowly Naming, attention and repetition intact. Cranial nerves: Pupils equal round reactive to light, extra ocular movements intact, visual fields full, mild left lower facial weakness, facial sensation intact bilaterally, auditory acuity grossly reduced even with hearing aids in place, palate elevates symmetrically, shoulder shrug intact, tongue midline. Motor exam:+ drift on Left. Mild left grip weakness. Diminished fine finger movements on the left. Orbits right over left approximately. Mild pill-rolling tremor at rest in the left hand. Cogwheel rigidity bilaterally. Positive glabellar  tap. Diminished facial expression. Sensory exam: No decreased sensation.  No extinction. Coordination: not tested DTRs: Absent in both ankles, trace DTRs in both knees.  Trace DTRs in both biceps and triceps. Gait testing was deferred at this time  IMAGING: I have personally reviewed the radiological images below and agree with the radiology interpretations.  Ct Head Wo Contrast Result Date: 10/24/2017 IMPRESSION: Small hyperdensity adjacent to the right carotid terminus may represent small amount of blood related to recent procedure. Hyperperfusion felt to be a secondary consideration given the fact that angiogram was performed over 12 hours ago. Patient has not developed CT evidence of a large right middle cerebral artery distribution infarct.   Ct Angio Head/ Neck W Or Wo Contrast Result Date: 10/23/2017 IMPRESSION: CTA NECK: 1. Mild atherosclerosis without hemodynamically significant stenosis or acute vascular process in the neck. 2. Multilevel moderate to severe neural foraminal narrowing. CTA HEAD: 1. Emergent distal RIGHT M1 occlusion. Intermediate collateralization by single-phase CTA. 2. Intracranial atherosclerosis, moderate stenosis LEFT P3 segment.   Dg Chest Port 1 View Result Date: 10/24/2017 IMPRESSION: 1. Cardiomegaly 2. Minimal subsegmental atelectasis or scarring at the left base. Electronically Signed   By: Jasmine Pang M.D.   On: 10/24/2017 00:30   Ct Head Code Stroke Wo Contrast Result Date: 10/23/2017 IMPRESSION: 1. No acute finding. Atrophy and chronic small-vessel ischemic changes of the cerebral hemispheric white matter. 2. ASPECTS is 10.   Echocardiogram:      Study Conclusions - Left ventricle: The cavity size was normal. Wall thickness was   normal. Systolic function was mildly to moderately reduced. The   estimated ejection fraction was in the range of 40% to 45%.   Diffuse hypokinesis. There is akinesis of the inferior and   inferoseptal myocardium. Doppler  parameters are consistent with   abnormal left ventricular relaxation (grade 1 diastolic   dysfunction). - Mitral valve: There was mild regurgitation. - Tricuspid valve: There was mild regurgitation. Impressions:- No cardiac source of emboli was indentified                          IMPRESSION: Kent Nolan is a 84 y.o. male with PMH of Pacemaker, paroxysmal atrial fibrillation not on anticoagulation, presented for evaluation of left-sided weakness. Low NIHSS 2 on admission, reason TPA was not initiated. His neurological exam worsened with worsening dysarthria and more left upper extremity weakness, endovascular team contacted for endovascular thrombectomy.  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 Findings.  1.Occluded RT IMCA M1 occlusion. S/P complete revascularization  With x 2 passes with solitaire 4mm x 40 mm retrieval device achieving a TICI 3 reperfusion. Also used 4.5 mg of IA integrelin.  Outstanding Stroke Work-up Studies:    Work up complete  PLAN  10/26/2017: Continue Statin/ASA/ Eliquis Frequent neuro checks Telemetry monitoring PT/OT/SLP Consult PM & Rehab Consult Case Management /MSW Ongoing aggressive stroke risk factor management Patient counseled to be compliant with his antithrombotic medications Patient counseled on Lifestyle modifications including, Diet, Exercise, and Stress Follow up with [facility] in 6  weeks  INTRACRANIAL Atherosclerosis &Stenosis: May consider DAPT prior to discharge  DYSPHAGIA: MBS - Dysphagia 2 (Fine chop) solids;Thin liquid Aspiration Precautions in progress Poor PO intake, Ensure added BID  MEDICAL ISSUES:  ACUTE RESPIRATORY FAILURE: Venti Management per CCM team - Appreciate assistance Extubated  On room air with no reports of SOB      AFIB, CHRONIC: Eliquis per pharmacy Pacerone in progress for rate control  Heart failure, unspecified -Continue BB No SOB documented  CKD Stage 3 (GFR 30-59) -Continue Gentle hydration - Creatinine trending down slowly -avoid nephrotoxic agents Repeat labs in AM  Anemia Trend - stable Repeat labs in AM  Thrombocytopenia Trending up Repeat labs in AM  HYPERTENSION: Stable, Avoid Hypotension SBP goal of < 180. DBP goal of < 105.  Labetolol PRN Long term BP goal normotensive. Home Meds: Pacerone  HYPERLIPIDEMIA:    Component Value Date/Time   CHOL 77 10/24/2017 0335   CHOL 106 12/14/2016 1056   TRIG 74 10/24/2017 0335   HDL 30 (L) 10/24/2017 0335   HDL 36 (L) 12/14/2016 1056   CHOLHDL 2.6 10/24/2017 0335   VLDL 15 10/24/2017 0335   LDLCALC 32 10/24/2017 0335   LDLCALC 45 12/14/2016 1056  Home Meds:  Crestor and Tricor LDL  goal < 70 Continued on Crestor 20 mg daily and Fenofibrate 160 mg Continue statin medications at discharge  DIABETES: Lab Results  Component Value Date   HGBA1C 7.8 (H) 10/24/2017  HgbA1c goal < 7.0 Currently on: Novolog Continue CBG monitoring and SSI to maintain glucose 140-180 mg/dl DM education   Likely Undiagnosed Parkinson's Outpatient Neurology follow up for further testing +tremors and cogwheel on exam  Other Active Problems: Active Problems:   Stroke (cerebrum) (HCC)   Middle cerebral artery embolism, right   Gait disturbance, post-stroke   Cognitive deficit, post-stroke    Hospital day # 3 VTE prophylaxis: SCD's  Diet : Fall precautions DIET DYS 2  Room service appropriate? Yes; Fluid consistency: Thin   FAMILY UPDATES:   family at bedside  TEAM UPDATES: Micki RileySethi, Olimpia Tinch S, MD   Prior Home Stroke Medications:  aspirin 81 mg daily and clopidogrel 75 mg daily  Discharge Stroke Meds:  Please discharge patient on ASA 81 mg daily and Eliquis 2.5 mg BID  Disposition: 01-Home or Self Care Therapy Recs:               CIR, Hopefully in AM Follow Up:  Follow-up Information    Micki RileySethi, Kella Splinter S, MD. Schedule an appointment as soon as possible for a visit in 6 week(s).   Specialties:  Neurology, Radiology Contact information: [address] [place] [address] [phone]        Corwin LevinsJohn, James W, MD. Schedule an appointment as soon as possible for a visit in 1 week(s).   Specialties:  Internal Medicine, Radiology Contact information: [address] [place]  [address] [phone]          Corwin Levins, MD -PCP Follow up in 1-2 weeks      Assessment & plan discussed with with attending physician and they are in agreement.    Brita Romp Stroke Neurology Team 10/26/2017 10:48 AM   Attending Note   10/24/2017 Patient presented with right M1 occlusion and get a fluctuating exam underwent mechanical thrombectomy with successful revascularization. Recommend strict blood pressure control and close neurological monitoring as per post intervention protocol. Check repeat brain imaging later today. Check swallow eval and resume home medications. Discussed with wife at the bedside and answered questions. Discussed with Dr. Warren Lacy  Attending Note 10/25/2017 Patient remains neurologically stable. He has persistent dysphagia and left hemiparesis. Plan mobilize out of bed today. Therapy consults. Check swallow eval. Resume home medications. Start eliquis for anticoagulation and discontinue Plavix. Discussed with daughter at the bedside and answered questions. Discussed with Dr. Warren Lacy.  Transfer to floor  today and  rehabilitation later  Attending Note 10/26/2017 And is walking around the unit with the physical therapist. He is ready to transfer out of the unit but that is not available. Patient's insurance will not allow him to go to inpatient rehabilitation for 5 days. Continue current management. Transfer to the floor when bed available. Discussed with patient and family at bedside. Greater than 50% time during this 25 minute visit was spent on counseling and coordination of care about his rehabilitation and answering questions  Delia Heady, MD [facility] [address] [place], [place] [address] Phone [phone] Fax [phone]  To contact Stroke Continuity provider, please refer to [email or web address]. After hours, contact General Neurology

## 2017-10-26 NOTE — Progress Notes (Signed)
Pt arrived to unit, AOx4, VSS.

## 2017-10-27 ENCOUNTER — Other Ambulatory Visit: Payer: Self-pay

## 2017-10-27 LAB — GLUCOSE, CAPILLARY
GLUCOSE-CAPILLARY: 172 mg/dL — AB (ref 65–99)
GLUCOSE-CAPILLARY: 149 mg/dL — AB (ref 65–99)
Glucose-Capillary: 130 mg/dL — ABNORMAL HIGH (ref 65–99)
Glucose-Capillary: 137 mg/dL — ABNORMAL HIGH (ref 65–99)

## 2017-10-27 NOTE — Progress Notes (Addendum)
NEUROHOSPITALISTS STROKE TEAM - DAILY PROGRESS NOTE   ADMISSION HISTORY: Kent Nolan is a 84 y.o. male past medical history of paroxysmal atrial fibrillation not on anticoagulation currently on dual antiplatelets, bradycardia status post pacemaker 2011, chronic kidney disease, hypertension, coronary artery disease, hyperlipidemia, diabetes, prior best report by EMS last seen normal at 3:10 PM today 10/23/2017, was noted by the wife to have witnessed a fall as he was trying to get out of the chair and go to use the bathroom. EMS was called.  Upon EMS initial assessment, there was some left-sided weakness that started to improve.  They made him walk which presumably worsened his symptoms.  En route, he started becoming better with only some dysarthria and left facial droop, which I also found on my exam. No preceding sickness or illness.  No chest pain shortness of breath.  No nausea vomiting.  No visual symptoms.  No headaches. No history of hitting his head on the floor during the fall. LKW: 3:10 PM on 10/23/2017 tpa given?: no, low NIH. Premorbid modified Rankin scale (mRS): 2 Initial NIH on arrival at the ER bridge-2-1 for dysarthria 1 for facial. Repeat NIH exam probably worse- 2 for dysarthria, may be 1 for the left arm, and 1 left face.  SUBJECTIVE (INTERVAL HISTORY) No family is at the bedside. Patient is found sitting in chair in NAD. Overall he feel his condition is gradually improving. Voices no new complaints. Has been walking in hallways with PT this AM. No new events reported overnight. Awaiting insurance approval for CIR.    OBJECTIVE Lab Results: CBC:  Recent Labs  Lab 10/24/17 0335 10/25/17 0410 10/26/17 0339  WBC 9.0 10.3 8.1  HGB 12.2* 11.2* 11.3*  HCT 36.9* 35.0* 35.3*  MCV 95.8 96.4 98.1  PLT 140* 148* 142*   BMP: Recent Labs  Lab 10/23/17 1618 10/23/17 1631 10/24/17 0335 10/25/17 0410 10/26/17 0339  NA  136 139 136 139 138  K 3.8 3.8 4.0 4.2 3.8  CL 104 102 106 106 105  CO2 22  --  21* 22 24  GLUCOSE 266* 256* 236* 168* 131*  BUN 26* 28* 23* 24* 23*  CREATININE 1.88* 1.70* 1.59* 1.70* 1.51*  CALCIUM 9.0  --  8.5* 8.7* 8.6*  MG  --   --   --  1.9  --   PHOS  --   --   --  2.7  --    Liver Function Tests:  Recent Labs  Lab 10/23/17 1618  AST 28  ALT 16*  ALKPHOS 69  BILITOT 0.7  PROT 6.2*  ALBUMIN 3.4*   Coagulation Studies:  No results for input(s): APTT, INR in the last 72 hours. Microbiology:   PHYSICAL EXAM Temp:  [97.9 F (36.6 C)-98.8 F (37.1 C)] 97.9 F (36.6 C) (02/22 1130) Pulse Rate:  [64-69] 69 (02/22 1130) Resp:  [15-20] 20 (02/22 1130) BP: (103-141)/(55-109) 132/55 (02/22 1130) SpO2:  [96 %-100 %] 98 % (02/22 1130) General - Well nourished, well developed, in no apparent distress HEENT-  Normocephalic,    Cardiovascular - Regular rate and rhythm  Respiratory - Lungs clear bilaterally. No wheezing. Abdomen - soft and non-tender, BS normal Extremities- no edema or cyanosis Patient is awake alert oriented x3. His speech is mildly dysarthric. Improving slowly Naming, attention and repetition intact. Cranial nerves: Pupils equal round reactive to light, extra ocular movements intact, visual fields full, mild left lower facial weakness, facial sensation intact bilaterally, auditory acuity grossly reduced  even with hearing aids in place, palate elevates symmetrically, shoulder shrug intact, tongue midline. Motor exam:+ drift on Left. Mild left grip weakness. Diminished fine finger movements on the left. Orbits right over left approximately. Mild pill-rolling tremor at rest in the left hand. Cogwheel rigidity bilaterally. Positive glabellar tap. Diminished facial expression. Sensory exam: No decreased sensation.  No extinction. Coordination: not tested DTRs: Absent in both ankles, trace DTRs in both knees.  Trace DTRs in both biceps and triceps. Gait testing was  deferred at this time  IMAGING: I have personally reviewed the radiological images below and agree with the radiology interpretations.  Ct Head Wo Contrast Result Date: 10/24/2017 IMPRESSION: Small hyperdensity adjacent to the right carotid terminus may represent small amount of blood related to recent procedure. Hyperperfusion felt to be a secondary consideration given the fact that angiogram was performed over 12 hours ago. Patient has not developed CT evidence of a large right middle cerebral artery distribution infarct.   Ct Angio Head/ Neck W Or Wo Contrast Result Date: 10/23/2017 IMPRESSION: CTA NECK: 1. Mild atherosclerosis without hemodynamically significant stenosis or acute vascular process in the neck. 2. Multilevel moderate to severe neural foraminal narrowing. CTA HEAD: 1. Emergent distal RIGHT M1 occlusion. Intermediate collateralization by single-phase CTA. 2. Intracranial atherosclerosis, moderate stenosis LEFT P3 segment.   Dg Chest Port 1 View Result Date: 10/24/2017 IMPRESSION: 1. Cardiomegaly 2. Minimal subsegmental atelectasis or scarring at the left base. Electronically Signed   By: Jasmine Pang M.D.   On: 10/24/2017 00:30   Ct Head Code Stroke Wo Contrast Result Date: 10/23/2017 IMPRESSION: 1. No acute finding. Atrophy and chronic small-vessel ischemic changes of the cerebral hemispheric white matter. 2. ASPECTS is 10.   Echocardiogram:      Study Conclusions - Left ventricle: The cavity size was normal. Wall thickness was   normal. Systolic function was mildly to moderately reduced. The   estimated ejection fraction was in the range of 40% to 45%.   Diffuse hypokinesis. There is akinesis of the inferior and   inferoseptal myocardium. Doppler parameters are consistent with   abnormal left ventricular relaxation (grade 1 diastolic   dysfunction). - Mitral valve: There was mild regurgitation. - Tricuspid valve: There was mild regurgitation. Impressions:- No cardiac  source of emboli was indentified                          IMPRESSION: [name] is a 84 y.o. male with PMH of Pacemaker, paroxysmal atrial fibrillation not on anticoagulation, presented for evaluation of left-sided weakness. Low NIHSS 2 on admission, reason TPA was not initiated. His neurological exam worsened with worsening dysarthria and more left upper extremity weakness, endovascular team contacted for endovascular thrombectomy.  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 Findings. 1.Occluded RT IMCA M1 occlusion. S/P complete revascularization  With x 2 passes with solitaire 4mm x 40 mm retrieval device achieving a TICI 3 reperfusion. Also used 4.5 mg of IA integrelin.  Outstanding Stroke Work-up Studies:    Work  up complete  PLAN  10/27/2017: Continue Statin/ASA/ Eliquis Frequent neuro checks Telemetry monitoring PT/OT/SLP Consult PM & Rehab - Awaiting insurance approval for CIR admission Consult Case Management /MSW Ongoing aggressive stroke risk factor management Patient counseled to be compliant with his antithrombotic medications Patient counseled on Lifestyle modifications including, Diet, Exercise, and Stress Follow up with [facility] in 6 weeks  INTRACRANIAL Atherosclerosis &Stenosis: May consider DAPT prior to discharge  DYSPHAGIA: MBS - Dysphagia 2 (Fine chop) solids;Thin liquid Aspiration Precautions in  progress Poor PO intake, Ensure added BID  MEDICAL ISSUES:  ACUTE RESPIRATORY FAILURE: Extubated  On room air with no reports of SOB      AFIB, CHRONIC: Eliquis per pharmacy Pacerone in progress for rate control  Heart failure, unspecified -Continue BB No SOB documented  CKD Stage 3 (GFR 30-59) -Continue Gentle hydration - Creatinine trending down slowly -avoid nephrotoxic agents Repeat labs in AM  Anemia Trend - stable Repeat labs in AM  Thrombocytopenia Trending up - stable Repeat labs in AM  HYPERTENSION: Stable, Avoid Hypotension SBP goal of < 180. DBP goal of < 105.  Labetolol PRN Long term BP goal normotensive. Home Meds: Pacerone  HYPERLIPIDEMIA:    Component Value Date/Time   CHOL 77 10/24/2017 0335   CHOL 106 12/14/2016 1056   TRIG 74 10/24/2017 0335   HDL 30 (L) 10/24/2017 0335   HDL 36 (L) 12/14/2016 1056   CHOLHDL 2.6 10/24/2017 0335   VLDL 15 10/24/2017 0335   LDLCALC 32 10/24/2017 0335   LDLCALC 45 12/14/2016 1056  Home Meds:  Crestor and Tricor LDL  goal < 70 Continued on Crestor 20 mg daily and Fenofibrate 160 mg Continue statin medications at discharge  DIABETES: Lab Results  Component Value Date   HGBA1C 7.8 (H) 10/24/2017  HgbA1c goal < 7.0 Currently on: Novolog Continue CBG monitoring and SSI to maintain glucose 140-180 mg/dl DM education   Likely Undiagnosed Parkinson's Outpatient Neurology follow up for further testing +tremors and cogwheel on exam  Other Active Problems: Active Problems:   Stroke (cerebrum) (HCC)   Middle cerebral artery embolism, right   Gait disturbance, post-stroke   Cognitive deficit, post-stroke    Hospital day # 4 VTE prophylaxis: SCD's  Diet : Fall precautions DIET DYS 2 Room service appropriate? Yes; Fluid consistency: Thin   FAMILY UPDATES:   family at bedside  TEAM UPDATES: Micki Riley, MD   Prior Home Stroke Medications:  aspirin 81 mg daily and clopidogrel 75 mg daily  Discharge  Stroke Meds:  Please discharge patient on ASA 81 mg daily and Eliquis 2.5 mg BID  Disposition: 01-Home or Self Care Therapy Recs:               CIR, Hopefully in AM Follow Up:  Follow-up Information    Micki Riley, MD. Schedule an appointment as soon as possible for a visit in 6 week(s).   Specialties:  Neurology, Radiology Contact information: [address] [place] [address] [phone]        Corwin Levins, MD. Schedule an appointment as soon as possible for a visit in 1 week(s).   Specialties:  Internal Medicine, Radiology Contact information: [address] [place] [facility] [place] [address] [phone]          Corwin Levins, MD -PCP Follow up in 1-2 weeks      Assessment & plan discussed with with attending physician and they are in agreement.  Brita RompMary A Costello, ANP-C Stroke Neurology Team 10/27/2017 12:39 PM   Attending Note   10/24/2017 Patient presented with right M1 occlusion and get a fluctuating exam underwent mechanical thrombectomy with successful revascularization. Recommend strict blood pressure control and close neurological monitoring as per post intervention protocol. Check repeat brain imaging later today. Check swallow eval and resume home medications. Discussed with wife at the bedside and answered questions. Discussed with Dr. Warren LacyWalter Gray  Attending Note 10/25/2017 Patient remains neurologically stable. He has persistent dysphagia and left hemiparesis. Plan mobilize out of bed today. Therapy consults. Check swallow eval. Resume home medications. Start eliquis for anticoagulation and discontinue Plavix. Discussed with daughter at the bedside and answered questions. Discussed with Dr. Warren LacyWalter Gray.  Transfer to floor today and  rehabilitation later  Attending Note 10/26/2017 And is walking around the unit with the physical therapist. He is ready to transfer out of the unit but that is not available. Patient's insurance will not allow him to  go to inpatient rehabilitation for 5 days. Continue current management. Transfer to the floor when bed available. Discussed with patient and family at bedside. Greater than 50% time during this 25 minute visit was spent on counseling and coordination of care about his rehabilitation and answering questions  Attending Note 10/27/2017 Patient is sitting up in a chair. Awaiting decision on inpatient rehabilitation versus nursing home. No family available at the bedside.  Delia HeadyPramod Sethi, MD [facility] [address] [place], [place] [id number] Phone [phone] Fax [phone]  To contact Stroke Continuity provider, please refer to [email or web address]. After hours, contact General Neurology

## 2017-10-27 NOTE — Care Management Important Message (Signed)
Important Message  Patient Details  Name: Kent GammaWilliam P Nolan MRN: [id number] Date of Birth: March 18, 1929   Medicare Important Message Given:  Yes    Kent Nolan 10/27/2017, 12:37 PM

## 2017-10-27 NOTE — Progress Notes (Signed)
  Speech Language Pathology Treatment: Dysphagia;Cognitive-Linquistic  Patient Details Name: Kent Nolan MRN: [id number] DOB: [birth year] Today's Date: 10/27/2017 Time: [id number] SLP Time Calculation (min) (ACUTE ONLY): 31 min  Assessment / Plan / Recommendation Clinical Impression  Continued dysphagia and cognitive linguistic intervention including caregiver education. Pts spouse and daughter at bedside. Reviewed swallow precautions for carryover at home including small sips via straw with chin tuck. Pt with frequent cough with thin liquids, though family reports pt was told to cough intermittently from therapist for improved airway protection (difficult to differentiate from strategy vs spontaneous cough) . Vocal quality remained clear throughout PO trials of thin and mechanical soft consistencies. SLP educated on strategies for improved recall with functional tasks including visual aids in environment, repetition of information, and written expression. Pt admits relying on his spouse for all complex ADLs. [facility] to maximize cognitive linguistics and swallow function. ST to continue to monitor during acute stay.    HPI HPI: Pt is an 84 year old man who presents with left-sided weakness, found to have a R M1 occlusion s/p revascularization. PMH also includes afib, bradycardia s/p pacemaker, CKD, HTN, CAD, HLD, DM, GERD      SLP Plan  Continue with current plan of care       Recommendations  Diet recommendations: Dysphagia 3 (mechanical soft);Thin liquid Liquids provided via: Straw;Cup Medication Administration: Whole meds with puree Supervision: Full supervision/cueing for compensatory strategies;Patient able to self feed Compensations: Minimize environmental distractions;Slow rate;Small sips/bites;Chin tuck;Other (Comment) Postural Changes and/or Swallow Maneuvers: Seated upright 90 degrees;Chin tuck                Oral Care  Recommendations: Oral care BID Follow up Recommendations: Home health SLP SLP Visit Diagnosis: Dysphagia, oropharyngeal phase (R13.12) Plan: Continue with current plan of care       GO               Jeani Sowhelsea Josy Peaden MA, CCC-SLP Acute Care Speech Language Pathologist    Kent Nolan 10/27/2017, 3:36 PM

## 2017-10-27 NOTE — PMR Pre-admission (Incomplete)
PMR Admission Coordinator Pre-Admission Assessment  Patient: Kent Nolan is an 84 y.o., male MRN: [id number] DOB: 07-07-1929 Height: 5' 7"  (170.2 cm) Weight: 83.5 kg (184 lb 1.4 oz)              Insurance Information HMO:    PPO: Yes     PCP:       IPA:       80/20:       OTHER:   PRIMARY: [employer] medicare      Policy#: [id number]      Subscriber:  patient CM Name: ***      Phone#: ***     Fax#: [phone] Pre-Cert#:  [id number]      Employer:  Retired Benefits:  Phone #: [phone]     Name:  On line Eff. Date: 09/05/17     Deduct:  $0      Out of Pocket Max:  $4200 (met $45)      Life Max: N/A CIR: $250 days 1-6      SNF: $0 days 1-20; $164 days 21-100 Outpatient:  Medical necessity     Co-Pay: $40/visit Home Health: 100%      Co-Pay: none DME: 80%     Co-Pay: 20% Providers: in network  Emergency Contact Information Contact Information    Name Relation Home Work Mobile   Guastella,[name] Spouse [phone]     [name] Daughter [phone]     Elkins,Cathy Daughter [phone]       Current Medical History  Patient Admitting Diagnosis:  R MCA infarct  History of Present Illness: An 84 y.o.malewith history of HTN, PAF, bradycardia s/p PPMwho was admitted on 10/23/17 after fall with left sided weakness. He had some improvement enroute to hospital with left facial droop and mild dysarthria at admission. No tPA given as NIHSS 2 and he was underwent CTA showing acute M1 occlusion. He underwent cerebral angio with complete revascularization of right MCA occlusion with solitaire retrieval device and IA integrelin. Follow up CT head revealed small hyperdensity adjacent to right carotid terminus --question amount of blood related to procedure.Dr. Leonie Man felt that stroke was cardioembolic in nature due to PAF.   Patient also noted to have cogwheeling with termors on exam and question of undiagnosed Parkinson's disease.Follow up CT head done 2/20 showed punctate density  right inferior insula and faint subcentimeter density right M1 segment. Rigth groin wound post arteriogram to be treated with enzymatic debridement. Therapy evaluations done yesterday and CIR recommended due to functional deficits.   Total: 3=NIH  Past Medical History  Past Medical History:  Diagnosis Date  . BRADYCARDIA 08/04/2010   s/p PPM since 2011 - generator change 12/03/13  . Chronic kidney disease    followed by nephrology  . Coronary artery disease    remote subendo MI in February 2005 with stents to RCA &LCX; s/p scute thrombosis of both stents in February 2010 with cardiogenic shock - s/p BMS to both lesions; follow up cath in September 2010 was satisfactory.  He is medically managed with aspirin/Plavix  . GERD (gastroesophageal reflux disease)   . High risk medication use    on amiodarone for PAF/VT since February 2010  . HTN (hypertension)   . Hyperlipidemia   . Obesity   . Pacemaker   . PAF (paroxysmal atrial fibrillation) ([place])    s/p cardioversion in August 2011  . Type II or unspecified type diabetes mellitus without mention of complication, uncontrolled     Family History  family history includes  Diabetes in his father; Heart disease in his mother; Stomach cancer in his father.  Prior Rehab/Hospitalizations: No previous rehab.  Did have an MI in 2010, but no rehab post MI.  Has the patient had major surgery during 100 days prior to admission? No  Current Medications   Current Facility-Administered Medications:  .   stroke: mapping our early stages of recovery book, , Does not apply, Once, Amie Portland, MD .  0.9 %  sodium chloride infusion, , Intravenous, Continuous, Amie Portland, MD, Last Rate: 75 mL/hr at 10/26/17 0700 .  acetaminophen (TYLENOL) tablet 650 mg, 650 mg, Oral, Q4H PRN, 650 mg at 10/25/17 0432 **OR** acetaminophen (TYLENOL) solution 650 mg, 650 mg, Per Tube, Q4H PRN **OR** acetaminophen (TYLENOL) suppository 650 mg, 650 mg, Rectal, Q4H PRN,  Deveshwar, Sanjeev, MD .  amiodarone (PACERONE) tablet 100 mg, 100 mg, Oral, Daily, Costello, Mary A, NP, 100 mg at 10/27/17 1128 .  apixaban (ELIQUIS) tablet 2.5 mg, 2.5 mg, Oral, BID, Romona Curls, RPH, 2.5 mg at 10/27/17 1128 .  aspirin EC tablet 81 mg, 81 mg, Oral, Daily, Costello, Mary A, NP, 81 mg at 10/27/17 1128 .  collagenase (SANTYL) ointment, , Topical, Daily, Sethi, Pramod S, MD .  feeding supplement (ENSURE ENLIVE) (ENSURE ENLIVE) liquid 237 mL, 237 mL, Oral, BID BM, Costello, Mary A, NP, 237 mL at 10/27/17 1128 .  fenofibrate tablet 160 mg, 160 mg, Oral, Daily, Costello, Mary A, NP, 160 mg at 10/27/17 1128 .  insulin aspart (novoLOG) injection 0-15 Units, 0-15 Units, Subcutaneous, TID WC, Costello, Mary A, NP, 2 Units at 10/27/17 1137 .  insulin aspart (novoLOG) injection 0-5 Units, 0-5 Units, Subcutaneous, QHS, Costello, Mary A, NP, 2 Units at 10/25/17 2114 .  labetalol (NORMODYNE,TRANDATE) injection 5 mg, 5 mg, Intravenous, Q2H PRN, Costello, Mary A, NP .  MEDLINE mouth rinse, 15 mL, Mouth Rinse, BID, Amie Portland, MD, 15 mL at 10/27/17 1137 .  rosuvastatin (CRESTOR) tablet 20 mg, 20 mg, Oral, Daily, Costello, Mary A, NP, 20 mg at 10/27/17 1128 .  senna-docusate (Senokot-S) tablet 1 tablet, 1 tablet, Oral, QHS PRN, Amie Portland, MD  Patients Current Diet: Fall precautions DIET DYS 2 Room service appropriate? Yes; Fluid consistency: Thin  Precautions / Restrictions Precautions Precautions: Fall Restrictions Weight Bearing Restrictions: No   Has the patient had 2 or more falls or a fall with injury in the past year?No.  Wife reports 1 fall associated with current stroke.  Prior Activity Level Community (5-7x/wk): Went out daily, wife usually does the driving.  Home Assistive Devices / Equipment Home Equipment: Environmental consultant - 2 wheels, Environmental consultant - 4 wheels, [place] - single point, Civil engineer, contracting  Prior Device Use: Indicate devices/aids used by the patient prior to current illness,  exacerbation or injury? Walker and [employer]  Prior Functional Level Prior Function Level of Independence: Independent Comments: Pt reports his wife assists him sometimes with putting on his suspenders, otherwise he indicates he is independent.  He reports he drives sometimes, but mostly wife does the driving.  He prefers tub baths.  He walks with RW when Low back flares up   Self Care: Did the patient need help bathing, dressing, using the toilet or eating?  Independent  Indoor Mobility: Did the patient need assistance with walking from room to room (with or without device)? Independent  Stairs: Did the patient need assistance with internal or external stairs (with or without device)? Independent  Functional Cognition: Did the patient need help planning regular  tasks such as shopping or remembering to take medications? Independent  Current Functional Level Cognition  Arousal/Alertness: Awake/alert Overall Cognitive Status: Impaired/Different from baseline Current Attention Level: Selective Orientation Level: Oriented X4 Following Commands: Follows one step commands consistently, Follows multi-step commands inconsistently Safety/Judgement: Decreased awareness of safety, Decreased awareness of deficits General Comments: Pt slow to process info.  Min - mod cues for problem solving during familiar activiites  Attention: Sustained Sustained Attention: Appears intact Awareness: Impaired Awareness Impairment: Intellectual impairment, Emergent impairment, Anticipatory impairment Problem Solving: Appears intact(basic, functional tasks)    Extremity Assessment (includes Sensation/Coordination)  Upper Extremity Assessment: Defer to OT evaluation  Lower Extremity Assessment: Generalized weakness, RLE deficits/detail, LLE deficits/detail(Bilateral weakness L slightly greater than right) RLE Deficits / Details: sensation intact, decreased coordination grossly RLE Coordination: decreased gross  motor LLE Deficits / Details: sensation intact, decreased coordination grossly LLE Coordination: decreased gross motor    ADLs  Overall ADL's : Needs assistance/impaired Eating/Feeding: Set up, Sitting Grooming: Wash/dry hands, Wash/dry face, Oral care, Min guard, Standing Grooming Details (indicate cue type and reason): requires verbal cues to locate wash cloths on his Rt  Upper Body Bathing: Minimal assistance, Sitting Lower Body Bathing: Maximal assistance, Sit to/from stand Upper Body Dressing : Minimal assistance, Sitting Lower Body Dressing: Moderate assistance, Sit to/from stand Lower Body Dressing Details (indicate cue type and reason): Pt demonstrates difficulty pulling socks over toes  Toilet Transfer: Minimal assistance, Ambulation, Comfort height toilet, Grab bars, RW Toileting- Clothing Manipulation and Hygiene: Moderate assistance, Sit to/from stand Functional mobility during ADLs: Minimal assistance, Rolling walker General ADL Comments: Pt with posterior lean.  requires Assist with standing balance, and is unable to access feet     Mobility  Overal bed mobility: Needs Assistance Bed Mobility: Supine to Sit Supine to sit: Min assist General bed mobility comments: up in chair     Transfers  Overall transfer level: Needs assistance Transfers: Sit to/from Stand Sit to Stand: Min guard Stand pivot transfers: Min assist General transfer comment: assist to steady during turns     Ambulation / Gait / Stairs / Wheelchair Mobility  Ambulation/Gait Ambulation/Gait assistance: Museum/gallery curator (Feet): 300 Feet Assistive device: Rolling walker (2 wheeled) Gait Pattern/deviations: Step-through pattern, Decreased stride length General Gait Details: pt with good use of RW with cues for posture and position in RW. Pt with fatigue stepping posterior to RW and needing increased assist for stability with chair to follow.  Gait velocity interpretation: Below normal  speed for age/gender    Posture / Balance Balance Overall balance assessment: Needs assistance Sitting-balance support: Feet supported Sitting balance-Leahy Scale: Good Standing balance support: Bilateral upper extremity supported Standing balance-Leahy Scale: Poor Standing balance comment: pt requires bil UE support and mod A for balance     Special needs/care consideration BiPAP/CPAP No CPM No Continuous Drip IV 0.9% NS 75 mL/hr Dialysis No    Life Vest: No Oxygen No Special Bed No Trach Size No Wound Vac (area) No  Skin Bruises and bleeding under skin upper extremities due to anticoagulation.                            Bowel mgmt: Last BM 10/26/17 Bladder mgmt: Voiding in urinal Diabetic mgmt No    Previous Home Environment Living Arrangements: Spouse/significant other  Lives With: Spouse Available Help at Discharge: Family, Available 24 hours/day Type of Home: House Home Layout: One level Home Access: Stairs to enter Entrance  Stairs-Number of Steps: 1 at rear entry which is the entrance most used  Bathroom Shower/Tub: Multimedia programmer: Handicapped height [place]: No Additional Comments: Pt HOH and dysarthric making it a bit difficult to obtain info accurately.  No family  present during eval   Discharge Living Setting Plans for Discharge Living Setting: Patient's home, House, Lives with (comment)(Lives with wife.) Type of Home at Discharge: House Discharge Home Layout: One level Discharge Home Access: Stairs to enter Entrance Stairs-Number of Steps: 2 steps at the kitchen entrance. Does the patient have any problems obtaining your medications?: No  Social/Family/Support Systems Patient Roles: Spouse, Parent(Has a wife and 2 daughters.) Contact Information: Joseph Bias - wife Anticipated Caregiver: wife Anticipated Caregiver's Contact Information: Tama Headings" - wife [phone] Ability/Limitations of Caregiver: Wife can provide  supervision.  Has 2 daughters who work and live about 2 miles away. Caregiver Availability: 24/7 Discharge Plan Discussed with Primary Caregiver: Yes Is Caregiver In Agreement with Plan?: Yes Does Caregiver/Family have Issues with Lodging/Transportation while Pt is in Rehab?: No  Goals/Additional Needs Patient/Family Goal for Rehab: PT/OT/SLP supervision goals Expected length of stay: 14-18 days Cultural Considerations: [employer] Dietary Needs: Dys 2, thin liquids Equipment Needs: TBD Pt/Family Agrees to Admission and willing to participate: Yes(I met with patient and his wife.) Program Orientation Provided & Reviewed with Pt/Caregiver Including Roles  & Responsibilities: Yes Additional Information Needs: n  Decrease burden of Care through IP rehab admission: N/A  Possible need for SNF placement upon discharge: Not anticipated  Patient Condition: This patient's medical and functional status has changed since the consult dated: 10/25/17 in which the Rehabilitation Physician determined and documented that the patient's condition is appropriate for intensive rehabilitative care in an inpatient rehabilitation facility. See "History of Present Illness" (above) for medical update. Functional changes are:  Currently requiring min assist to ambulate 100 feet RW . Patient's medical and functional status update has been discussed with the Rehabilitation physician and patient remains appropriate for inpatient rehabilitation. Will admit to inpatient rehab today.  Preadmission Screen Completed By:  Retta Diones, 10/27/2017 11:38 AM ______________________________________________________________________   Discussed status with Dr. Marland Kitchenon***at *** and received telephone approval for admission today.  Admission Coordinator:  Retta Diones, time***/Date***

## 2017-10-27 NOTE — Progress Notes (Signed)
Rehab admissions - I have received a denial for acute inpatient rehab admission from insurance carrier.  [facility]  I have informed the attending MD/PA of denial.  Call me for questions.  #[phone]

## 2017-10-27 NOTE — Progress Notes (Signed)
CSW alerted by Rehab Admissions that patient was denied authorization to CIR. CSW met with patient and family at bedside to discuss other options available, and with the patient walking 300 feet, patient will not get [facility] authorization at this time for SNF. CSW provided information to patient and family, and that patient can go home with home health services. Patient and family indicated understanding.  CSW signing off.  Laveda Abbe, [place] Clinical Social Worker [phone]

## 2017-10-27 NOTE — Progress Notes (Signed)
Physical Therapy Treatment Patient Details Name: Kent Nolan MRN: [id number] DOB: September 17, 1928 Today's Date: 10/27/2017    History of Present Illness This 84 y.o. male admitted after sustaining a fall and Lt sided weakness.  Symptoms improved en route to ED.  CT of head showed possible dense Rt MCA, and CTA showed Rt M1 occlusion.  He underwent embolectomy.   PMH includes bradycardia, CKD, CAD, HNT, pacemaker, PAF, DM    PT Comments    Patient progressing well towards PT goals. Tolerated gait training with Min A for balance/safety and cues for RW proximity and upright posture. Pt reports chronic back pain which results in pt needing multiple standing rest breaks due to back "giving out." Pt with increased gait speed today putting pt at increased risk for falls. Continues to demonstrate slow processing and decreased safety awareness. Continues to be a great CIR candidate so pt can maximize independence and mobility and decrease fall risk. Will follow.    Follow Up Recommendations  Supervision/Assistance - 24 hour;CIR     Equipment Recommendations  None recommended by PT    Recommendations for Other Services       Precautions / Restrictions Precautions Precautions: Fall Restrictions Weight Bearing Restrictions: No    Mobility  Bed Mobility               General bed mobility comments: up in chair   Transfers Overall transfer level: Needs assistance Equipment used: Rolling walker (2 wheeled) Transfers: Sit to/from Stand Sit to Stand: Min guard         General transfer comment: Min guard for safety. Stood from chair x1, attempted to sit in chair prematurely before getting directly in front of it.   Ambulation/Gait Ambulation/Gait assistance: Min assist Ambulation Distance (Feet): 250 Feet Assistive device: Rolling walker (2 wheeled) Gait Pattern/deviations: Step-through pattern;Decreased stride length;Trunk flexed Gait velocity: increased speed today   General  Gait Details: very flexed trunk posture with cues for RW proximity/management. Cues for upright. Adjusted RW height. 3 standing rest break due to back locking up. HR stabilized in 90s bpm.   Stairs            Wheelchair Mobility    Modified Rankin (Stroke Patients Only) Modified Rankin (Stroke Patients Only) Pre-Morbid Rankin Score: No symptoms Modified Rankin: Moderately severe disability     Balance Overall balance assessment: Needs assistance Sitting-balance support: Feet supported;No upper extremity supported Sitting balance-Leahy Scale: Good     Standing balance support: During functional activity;Bilateral upper extremity supported Standing balance-Leahy Scale: Poor Standing balance comment: pt requires bil UE support                            Cognition Arousal/Alertness: Awake/alert Behavior During Therapy: WFL for tasks assessed/performed   Area of Impairment: Problem solving;Following commands;Safety/judgement                       Following Commands: Follows multi-step commands inconsistently;Follows one step commands consistently Safety/Judgement: Decreased awareness of safety   Problem Solving: Slow processing;Requires verbal cues General Comments: Slow to process info/questions but answers appropriately. A&Ox4. HOH as batteries in hearing aids are dying.       Exercises      General Comments General comments (skin integrity, edema, etc.): Wife and daughter present.       Pertinent Vitals/Pain Pain Assessment: Faces Faces Pain Scale: Hurts little more Pain Location: low back (chronic)  Pain Descriptors /  Indicators: Aching Pain Intervention(s): Monitored during session;Repositioned    Home Living                      Prior Function            PT Goals (current goals can now be found in the care plan section) Progress towards PT goals: Progressing toward goals    Frequency    Min 4X/week      PT Plan  Current plan remains appropriate    Co-evaluation              AM-PAC PT "6 Clicks" Daily Activity  Outcome Measure  Difficulty turning over in bed (including adjusting bedclothes, sheets and blankets)?: A Lot Difficulty moving from lying on back to sitting on the side of the bed? : A Lot Difficulty sitting down on and standing up from a chair with arms (e.g., wheelchair, bedside commode, etc,.)?: A Little Help needed moving to and from a bed to chair (including a wheelchair)?: A Little Help needed walking in hospital room?: A Little Help needed climbing 3-5 steps with a railing? : A Lot 6 Click Score: 15    End of Session Equipment Utilized During Treatment: Gait belt Activity Tolerance: Patient tolerated treatment well Patient left: in chair;with call bell/phone within reach;with chair alarm set;with family/visitor present Nurse Communication: Mobility status PT Visit Diagnosis: Other abnormalities of gait and mobility (R26.89);Unsteadiness on feet (R26.81)     Time: [id number] PT Time Calculation (min) (ACUTE ONLY): 23 min  Charges:  $Gait Training: 8-22 mins $Therapeutic Exercise: 8-22 mins                    G Codes:       Mylo RedShauna Sheelah Ritacco, PT, DPT [phone]     Blake DivineShauna A Sinai Mahany 10/27/2017, 2:12 PM

## 2017-10-27 NOTE — Care Management Note (Signed)
Case Management Note  Patient Details  Name: Kent Nolan MRN: [id number] Date of Birth: 07-15-1929  Subjective/Objective:                    Action/Plan: Pt denied for CIR per his [facility]. Pt is ambulating 250 feet with min assist. CM and CSW spoke with patient and his wife and [facility] are [facility]able [facility]. [facility] provided [facility]. [facility]. [facility]  CM following.   Expected Discharge Date:  10/27/17               Expected Discharge Plan:  Home w Home Health Services  In-House Referral:     Discharge planning Services  CM Consult  Post Acute Care Choice:  Home Health Choice offered to:  Spouse  DME Arranged:    DME Agency:     HH Arranged:    HH [facility]  Status of Service:  In process, will continue to follow  If discussed at Long Length of Stay Meetings, dates discussed:    Additional Comments:  Kermit BaloKelli F Jaymien Landin, RN 10/27/2017, 4:15 PM

## 2017-10-28 ENCOUNTER — Encounter (HOSPITAL_COMMUNITY): Payer: Self-pay | Admitting: *Deleted

## 2017-10-28 DIAGNOSIS — I63411 Cerebral infarction due to embolism of right middle cerebral artery: Principal | ICD-10-CM

## 2017-10-28 DIAGNOSIS — Z950 Presence of cardiac pacemaker: Secondary | ICD-10-CM

## 2017-10-28 DIAGNOSIS — I25119 Atherosclerotic heart disease of native coronary artery with unspecified angina pectoris: Secondary | ICD-10-CM

## 2017-10-28 LAB — BASIC METABOLIC PANEL
Anion gap: 6 (ref 5–15)
BUN: 31 mg/dL — ABNORMAL HIGH (ref 6–20)
CALCIUM: 8.4 mg/dL — AB (ref 8.9–10.3)
CHLORIDE: 105 mmol/L (ref 101–111)
CO2: 26 mmol/L (ref 22–32)
CREATININE: 1.66 mg/dL — AB (ref 0.61–1.24)
GFR calc Af Amer: 41 mL/min — ABNORMAL LOW (ref >60)
GFR calc non Af Amer: 36 mL/min — ABNORMAL LOW (ref >60)
GLUCOSE: 140 mg/dL — AB (ref 65–99)
Potassium: 3.7 mmol/L (ref 3.5–5.1)
Sodium: 137 mmol/L (ref 135–145)

## 2017-10-28 LAB — CBC
HEMATOCRIT: 32.2 % — AB (ref 39.0–52.0)
HEMOGLOBIN: 10.4 g/dL — AB (ref 13.0–17.0)
MCH: 31.4 pg (ref 26.0–34.0)
MCHC: 32.3 g/dL (ref 30.0–36.0)
MCV: 97.3 fL (ref 78.0–100.0)
Platelets: 140 10*3/uL — ABNORMAL LOW (ref 150–400)
RBC: 3.31 MIL/uL — ABNORMAL LOW (ref 4.22–5.81)
RDW: 13.7 % (ref 11.5–15.5)
WBC: 5.5 10*3/uL (ref 4.0–10.5)

## 2017-10-28 LAB — MAGNESIUM: Magnesium: 1.9 mg/dL (ref 1.7–2.4)

## 2017-10-28 LAB — PHOSPHORUS: Phosphorus: 3.1 mg/dL (ref 2.5–4.6)

## 2017-10-28 LAB — GLUCOSE, CAPILLARY
GLUCOSE-CAPILLARY: 171 mg/dL — AB (ref 65–99)
Glucose-Capillary: 123 mg/dL — ABNORMAL HIGH (ref 65–99)

## 2017-10-28 MED ORDER — APIXABAN 2.5 MG PO TABS: 2.5000 mg | ORAL_TABLET | Freq: Two times a day (BID) | ORAL | 1 refills | Status: DC

## 2017-10-28 MED ORDER — AMIODARONE HCL 200 MG PO TABS: ORAL_TABLET | ORAL | 9 refills | Status: DC

## 2017-10-28 MED ORDER — ROSUVASTATIN CALCIUM 20 MG PO TABS: ORAL_TABLET | ORAL | 3 refills | Status: DC

## 2017-10-28 NOTE — Progress Notes (Signed)
Discharge instructions and prescriptions given. Pt has hearing aids, dentures, and all other belongings with him. Taken down via wheelchair by NT.

## 2017-10-28 NOTE — Discharge Summary (Addendum)
Stroke Discharge Summary  Patient ID: Kent Nolan   MRN: [id number]      DOB: 1929-03-06  Date of Admission: 10/23/2017 Date of Discharge: 10/28/2017  Attending Physician:  Micki Riley, Kent Nolan, Stroke Kent Nolan Consultant(s):   Treatment Team:  Stroke, Md, Kent Nolan rehabilitation medicine and Interventional Radiology  Patient's PCP:  Corwin Levins, Kent Nolan  DISCHARGE DIAGNOSIS:  Active Problems:   Stroke (cerebrum) [facility])   Middle cerebral artery embolism, right   Gait disturbance, post-stroke   Cognitive deficit, post-stroke Intracranial Atherosclerosis Dysphagia Acute Respiratory Failure AFIB, Chronic Heart Failure CKD Anemia Thrombocytopenia Hypertension Hyperlipidemia Diabetes Mellitus Undiagnosed Parkinson's  Past Medical History:  Diagnosis Date  . BRADYCARDIA 08/04/2010   s/p PPM since 2011 - generator change 12/03/13  . Chronic kidney disease    followed by nephrology  . Coronary artery disease    remote subendo MI in February 2005 with stents to RCA &LCX; s/p scute thrombosis of both stents in February 2010 with cardiogenic shock - s/p BMS to both lesions; follow up cath in September 2010 was satisfactory.  He is medically managed with aspirin/Plavix  . GERD (gastroesophageal reflux disease)   . High risk medication use    on amiodarone for PAF/VT since February 2010  . HTN (hypertension)   . Hyperlipidemia   . Obesity   . Pacemaker   . PAF (paroxysmal atrial fibrillation) (HCC)    s/p cardioversion in August 2011  . Type II or unspecified type diabetes mellitus without mention of complication, uncontrolled    Past Surgical History:  Procedure Laterality Date  . EP IMPLANTABLE DEVICE N/A 02/25/2015   Procedure: Lead Revision/Repair;  Surgeon: [name], Kent Nolan;  Location: [facility] INVASIVE CV LAB;  Service: Cardiovascular;  Laterality: N/A;  . IR CT HEAD LTD  10/23/2017  . IR PERCUTANEOUS ART THROMBECTOMY/INFUSION INTRACRANIAL INC DIAG ANGIO  10/23/2017  . PACEMAKER  GENERATOR CHANGE  12/03/13   MDT Adapta L generator change for premature ERI by Dr Johney Frame  . PACEMAKER INSERTION  May 2011  . PERMANENT PACEMAKER GENERATOR CHANGE N/A 12/03/2013   Procedure: PERMANENT PACEMAKER GENERATOR CHANGE;  Surgeon: [name], Kent Nolan;  Location: [facility] CATH LAB;  Service: Cardiovascular;  Laterality: N/A;  . RADIOLOGY WITH ANESTHESIA N/A 10/23/2017   Procedure: RADIOLOGY WITH ANESTHESIA;  Surgeon: [name], Kent Nolan;  Location: [facility] OR;  Service: Radiology;  Laterality: N/A;    Allergies as of 10/28/2017      Reactions   Penicillins Other (See Comments)   Reaction unknown   Sulfonamide Derivatives Other (See Comments)   Reaction unknown      Medication List    STOP taking these medications   acetaminophen 500 MG tablet Commonly known as:  TYLENOL   clopidogrel 75 MG tablet Commonly known as:  PLAVIX     TAKE these medications   amiodarone 200 MG tablet Commonly known as:  PACERONE TAKE 1/2 TABLET (100mg ) BY MOUTH EVERY DAY What changed:    how much to take  how to take this  when to take this  additional instructions   apixaban 2.5 MG Tabs tablet Commonly known as:  ELIQUIS Take 1 tablet (2.5 mg total) by mouth 2 (two) times daily.   aspirin 81 MG tablet Take 81 mg by mouth daily.   famotidine 40 MG tablet Commonly known as:  PEPCID Take 1 tablet (40 mg total) by mouth daily.   fenofibrate 145 MG tablet Commonly known as:  TRICOR Take 1 tablet (  145 mg total) by mouth daily.   multivitamin tablet Take 1 tablet by mouth daily.   nitroGLYCERIN 0.4 MG SL tablet Commonly known as:  NITROSTAT Place 1 tablet (0.4 mg total) under the tongue every 5 (five) minutes as needed. For chest pain   rosuvastatin 20 MG tablet Commonly known as:  CRESTOR TAKE 1 TABLET (20mg ) BY MOUTH EVERY DAY What changed:    how much to take  how to take this  when to take this  additional instructions   VITAMIN D-3 PO Take 1 tablet by mouth daily.       LABORATORY STUDIES CBC    Component Value Date/Time   WBC 5.5 10/28/2017 0211   RBC 3.31 (L) 10/28/2017 0211   HGB 10.4 (L) 10/28/2017 0211   HGB 14.0 06/12/2017 1051   HCT 32.2 (L) 10/28/2017 0211   HCT 42.7 06/12/2017 1051   PLT 140 (L) 10/28/2017 0211   PLT 175 06/12/2017 1051   MCV 97.3 10/28/2017 0211   MCV 96 06/12/2017 1051   MCH 31.4 10/28/2017 0211   MCHC 32.3 10/28/2017 0211   RDW 13.7 10/28/2017 0211   RDW 13.3 06/12/2017 1051   LYMPHSABS 0.4 (L) 10/24/2017 0335   LYMPHSABS 1.3 06/12/2017 1051   MONOABS 0.2 10/24/2017 0335   EOSABS 0.0 10/24/2017 0335   EOSABS 0.4 06/12/2017 1051   BASOSABS 0.0 10/24/2017 0335   BASOSABS 0.0 06/12/2017 1051   CMP    Component Value Date/Time   NA 137 10/28/2017 0211   NA 140 06/12/2017 1051   K 3.7 10/28/2017 0211   CL 105 10/28/2017 0211   CO2 26 10/28/2017 0211   GLUCOSE 140 (H) 10/28/2017 0211   BUN 31 (H) 10/28/2017 0211   BUN 26 06/12/2017 1051   CREATININE 1.66 (H) 10/28/2017 0211   CREATININE 2.35 (H) 12/02/2015 1058   CALCIUM 8.4 (L) 10/28/2017 0211   PROT 6.2 (L) 10/23/2017 1618   PROT 6.8 06/12/2017 1051   ALBUMIN 3.4 (L) 10/23/2017 1618   ALBUMIN 4.1 06/12/2017 1051   AST 28 10/23/2017 1618   ALT 16 (L) 10/23/2017 1618   ALKPHOS 69 10/23/2017 1618   BILITOT 0.7 10/23/2017 1618   BILITOT 0.5 06/12/2017 1051   GFRNONAA 36 (L) 10/28/2017 0211   GFRAA 41 (L) 10/28/2017 0211   COAGS Lab Results  Component Value Date   INR 1.04 10/23/2017   INR 1.1 (H) 11/26/2013   INR 2.81 (H) 04/20/2010   Lipid Panel    Component Value Date/Time   CHOL 77 10/24/2017 0335   CHOL 106 12/14/2016 1056   TRIG 74 10/24/2017 0335   HDL 30 (L) 10/24/2017 0335   HDL 36 (L) 12/14/2016 1056   CHOLHDL 2.6 10/24/2017 0335   VLDL 15 10/24/2017 0335   LDLCALC 32 10/24/2017 0335   LDLCALC 45 12/14/2016 1056   HgbA1C  Lab Results  Component Value Date   HGBA1C 7.8 (H) 10/24/2017   Urinalysis    Component Value  Date/Time   COLORURINE YELLOW 07/22/2015 1630   APPEARANCEUR CLEAR 07/22/2015 1630   LABSPEC >=1.030 (A) 07/22/2015 1630   PHURINE 5.5 07/22/2015 1630   GLUCOSEU NEGATIVE 07/22/2015 1630   HGBUR NEGATIVE 07/22/2015 1630   BILIRUBINUR NEGATIVE 07/22/2015 1630   KETONESUR NEGATIVE 07/22/2015 1630   PROTEINUR 30 (A) 07/28/2011 2103   UROBILINOGEN 1.0 07/22/2015 1630   NITRITE NEGATIVE 07/22/2015 1630   LEUKOCYTESUR NEGATIVE 07/22/2015 1630   Urine Drug Screen No results found for: LABOPIA, COCAINSCRNUR, LABBENZ, AMPHETMU,  THCU, LABBARB  Alcohol Level No results found for: [facility]  SIGNIFICANT DIAGNOSTIC STUDIES Ct Head Wo Contrast Result Date: 10/25/2017 IMPRESSION: 1. Persistent subcentimeter density RIGHT M1 segment, differential diagnosis includes recurrent embolization, enhancing residual thromboembolism. 2. Punctate densities RIGHT insula favoring enhancing minimal infarct This could be confirmed with MRI. Presence of cardiac pacemaker may not be a contraindication to MRI.  Ct Head Wo Contrast Result Date: 10/24/2017 IMPRESSION: Small hyperdensity adjacent to the right carotid terminus may represent small amount of blood related to recent procedure. Hyperperfusion felt to be a secondary consideration given the fact that angiogram was performed over 12 hours ago. Patient has not developed CT evidence of a large right middle cerebral artery distribution infarct.   Ct Angio Head/ Neck Nolan Or Wo Contrast Result Date: 10/23/2017 IMPRESSION: CTA NECK: 1. Mild atherosclerosis without hemodynamically significant stenosis or acute vascular process in the neck. 2. Multilevel moderate to severe neural foraminal narrowing. CTA HEAD: 1. Emergent distal RIGHT M1 occlusion. Intermediate collateralization by single-phase CTA. 2. Intracranial atherosclerosis, moderate stenosis LEFT P3 segment.   Dg Chest Port 1 View Result Date: 10/24/2017 IMPRESSION: 1. Cardiomegaly 2. Minimal subsegmental  atelectasis or scarring at the left base. Electronically Signed   By: Jasmine Pang M.D.   On: 10/24/2017 00:30   Ct Head Code Stroke Wo Contrast Result Date: 10/23/2017 IMPRESSION: 1. No acute finding. Atrophy and chronic small-vessel ischemic changes of the cerebral hemispheric white matter. 2. ASPECTS is 10.   Echocardiogram:      Study Conclusions - Left ventricle: The cavity size was normal. Wall thickness was normal. Systolic function was mildly to moderately reduced. The estimated ejection fraction was in the range of 40% to 45%. Diffuse hypokinesis. There is akinesis of the inferior and inferoseptal myocardium. Doppler parameters are consistent with abnormal left ventricular relaxation (grade 1 diastolic dysfunction). - Mitral valve: There was mild regurgitation. - Tricuspid valve: There was mild regurgitation. Impressions:- No cardiac source of emboli was indentified                         HISTORY OF PRESENT ILLNESS   &    HOSPITAL COURSE ADMISSION HISTORY: Kent Nolan a 84 y.o.malepast medical history of paroxysmal atrial fibrillation not on anticoagulation currently on dual antiplatelets, bradycardia status post pacemaker 2011, chronic kidney disease, hypertension, coronary artery disease, hyperlipidemia, diabetes, prior best report by EMS last seen normal at 3:10 PM today 10/23/2017, was noted by the wife to have witnessed a fall as he was trying to get out of the chair and go to use the bathroom. EMS was called. Upon EMS initial assessment, there was some left-sided weakness that started to improve. They made him walk which presumably worsened his symptoms. En route, he started becoming better with only some dysarthria and left facial droop, which I also found on my exam. No preceding sickness or illness. No chest pain shortness of breath. No nausea vomiting. No visual symptoms. No headaches. No history of hitting his head on the floor during the  fall. LKW:3:10 PM on 10/23/2017 tpa given?: no,low NIH. Premorbid modified Rankin scale (mRS):2 Initial NIH on arrival at the ER bridge-2-1 for dysarthria 1 for facial. Repeat NIH exam probably worse- 2 for dysarthria, may be 1 for the left arm, and 1 left face.  IMPRESSION: Kent Nolan is a 84 y.o. male with PMH of Pacemaker, paroxysmal atrial fibrillation not on anticoagulation, presented for evaluation of left-sided weakness. Low  NIHSS 2 on admission, reason TPA was not initiated. His neurological exam worsened with worsening dysarthria and more left upper extremity weakness, endovascular team contacted for endovascular thrombectomy.  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 Findings. 1.Occluded RT IMCA M1 occlusion. S/P complete revascularization With x 2 passes with solitaire 4mm x 40 mm retrieval device achieving a TICI 3 reperfusion. Also used 4.5 mg of IA integrelin.  Outstanding Stroke Work-up Studies:    Work up complete  PLAN  10/27/2017: Continue Statin/ASA/ Eliquis Discharge home today with family PT/OT/SLP - HH Consult Case Management /MSW Ongoing aggressive stroke risk factor management Patient counseled to be compliant withhisantithrombotic medications Patient counseled on Lifestyle modifications including, Diet, Exercise, and Stress Follow up with [facility] in 6 weeks  INTRACRANIAL Atherosclerosis  &Stenosis: On ASA and Eliquis for now  DYSPHAGIA: MBS - Dysphagia 2 (Fine chop) solids;Thin liquid Aspiration Precautions in progress PO intake improved with Ensure added BID  MEDICAL ISSUES:  ACUTE RESPIRATORY FAILURE: Extubated  On room air with no reports of SOB      AFIB, CHRONIC: Eliquis per pharmacy Pacerone in progress for rate control  Heart failure, unspecified -Continue BB No SOB documented  CKD Stage 3 (GFR 30-59) -Continue Gentle hydration - Creatinine trending down slowly -avoid nephrotoxic agents PCP to monitor  Anemia Trend - stable PCP to monitor  Thrombocytopenia Trending up - stable PCP to monitor  HYPERTENSION: Stable, Avoid Hypotension SBP goal of <180. DBP goal of <105.  Labetolol PRN Long term BP goal normotensive. Home Meds: Pacerone  HYPERLIPIDEMIA:         Component Value Date/Time   CHOL 77 10/24/2017 0335   CHOL 106 12/14/2016 1056   TRIG 74 10/24/2017 0335   HDL 30 (L) 10/24/2017 0335   HDL 36 (L) 12/14/2016 1056   CHOLHDL 2.6 10/24/2017 0335   VLDL 15 10/24/2017 0335   LDLCALC 32 10/24/2017 0335   LDLCALC 45 12/14/2016 1056  Home Meds:  Crestor and Tricor LDL  goal < 70 Continued on Crestor 20 mg daily and Fenofibrate 160 mg Continue statin medications at discharge  DIABETES:      Lab Results  Component Value Date   HGBA1C 7.8 (H) 10/24/2017  HgbA1c goal < 7.0 Currently on: Novolog Continue CBG monitoring and SSI to maintain glucose 140-180 mg/dl DM education   Likely Undiagnosed Parkinson's Outpatient Neurology follow up for further testing +tremors and cogwheel on exam  Other Active Problems: Active Problems:   Stroke (cerebrum) (HCC)   Middle cerebral artery embolism, right   Gait disturbance, post-stroke   Cognitive deficit, post-stroke  DISCHARGE EXAM Blood pressure (!) 131/58, pulse 70, temperature 98.6 F (37 C), temperature source Oral, resp. rate 16, height 5\' 7"  (1.702  m), weight 83.5 kg (184 lb 1.4 oz), SpO2 98 %. General - Well nourished, well developed, in no apparent distress HEENT-  Normocephalic,    Cardiovascular - Regular rate and rhythm  Respiratory - Lungs clear bilaterally. No wheezing. Abdomen - soft and non-tender, BS normal Extremities- no edema or cyanosis  Patient is awake alert oriented x3. His speech is mildly dysarthric. Improving slowly Naming, attention and repetition intact. Cranial nerves: Pupils equal round reactive to light, extra ocular movements intact, visual fields full, mild left lower facial weakness, facial sensation intact bilaterally, auditory acuity grossly reduced even with hearing aids in place, palate elevates symmetrically, shoulder shrug intact, tongue midline. Motor exam:+ drift on Left. Mild left grip weakness. Diminished fine finger movements on the left. Orbits right over left approximately. Mild pill-rolling tremor at rest in the left hand. Cogwheel rigidity bilaterally. Positive glabellar tap. Diminished facial expression. Sensory exam: No decreased sensation.  No extinction. Coordination: not tested DTRs: Absent in both ankles, trace DTRs in both knees.  Trace DTRs in both biceps and triceps. Gait testing was deferred at this time  Discharge Diet   Fall precautions DIET DYS 2 Room service appropriate? Yes; Fluid consistency: Thin liquids  DISCHARGE PLAN  Disposition:  HOME with HH  aspirin 81 mg daily and Eliquis (apixaban) daily for secondary stroke prevention.  Ongoing risk factor control by Primary Care Physician at time of discharge  Follow-up Corwin LevinsJohn, Kent Nolan, Kent Nolan in 2 weeks.  Follow-up with Dr. Delia HeadyPramod Sethi, Stroke Clinic in 6 weeks, office to schedule an appointment.  Greater than 30 minutes were spent preparing discharge.  Kent Nolan, ANP-C Stroke Neurology Team 10/28/2017 11:49 PM   ATTENDING NOTE: I reviewed above note and agree with the assessment and plan. I have made any additions or  clarifications directly to the above note. Pt was seen and examined.   84 year old male with history of bradycardia status post pacemaker 2011, CKD, CAD status post stent, HLD, HTN, obesity, DM who was recently found to have proximal A. fib.  Patient continued on DAPT due to history of CKD status post stent.  On this admission, patient was admitted for falling, left-sided weakness, left facial droop, slurred speech.  CT no acute abnormality, CT head and neck showed distal right M1 occlusion with diffuse atherosclerosis including left P3.  Had mechanical thrombectomy and achieving TICI 3 reperfusion.  EF 40-45%.  LDL 72 and A1c 7.8.  Repeat CT head showed punctate densities right insular fevers minimum infarct.  He was continued recent home medication Eliquis 2.5 twice daily.  Also continue amiodarone/fenofibrate/Crestor for stroke prevention.  PT OT recommend home PT and OT.  Patient discharged in good condition  Marvel PlanJindong Leman Martinek, MD PhD Stroke Neurology 10/28/2017 11:34 PM

## 2017-10-30 ENCOUNTER — Telehealth: Payer: Self-pay | Admitting: *Deleted

## 2017-10-30 NOTE — Telephone Encounter (Signed)
Transition Care Management Follow-up Telephone Call   Date discharged? 10/28/17   How have you been since you were released from the hospital? Called pt spoke with wife she states husband is doing ok   Do you understand why you were in the hospital? YES   Do you understand the discharge instructions? YES   Where were you discharged to? Home   Items Reviewed:  Medications reviewed: YES  Allergies reviewed: YES  Dietary changes reviewed: NO  Referrals reviewed: YES, wife asked about the home health orders. Inform per summary it states order was entered hopefully someone will give her a call in the next day two. If not when he see Dr. [facility]   Functional Questionnaire:   Activities of Daily Living (ADLs):   She states he are independent in the following: feeding, continence, grooming, toileting States they require assistance with the following: ambulation and bathing and hygiene   Any transportation issues/concerns?: NO   Any patient concerns? NO   Confirmed importance and date/time of follow-up visits scheduled YES, appt 11/08/17  Provider Appointment booked with Dr. Jonny Nolan   Confirmed with patient if condition begins to worsen call PCP or go to the ER.  Patient was given the office number and encouraged to call back with question or concerns.  : YES

## 2017-11-01 ENCOUNTER — Other Ambulatory Visit: Payer: Self-pay

## 2017-11-01 DIAGNOSIS — I509 Heart failure, unspecified: Secondary | ICD-10-CM | POA: Diagnosis not present

## 2017-11-01 DIAGNOSIS — E1122 Type 2 diabetes mellitus with diabetic chronic kidney disease: Secondary | ICD-10-CM | POA: Diagnosis not present

## 2017-11-01 DIAGNOSIS — I2510 Atherosclerotic heart disease of native coronary artery without angina pectoris: Secondary | ICD-10-CM | POA: Diagnosis not present

## 2017-11-01 DIAGNOSIS — I130 Hypertensive heart and chronic kidney disease with heart failure and stage 1 through stage 4 chronic kidney disease, or unspecified chronic kidney disease: Secondary | ICD-10-CM | POA: Diagnosis not present

## 2017-11-01 DIAGNOSIS — R531 Weakness: Secondary | ICD-10-CM | POA: Diagnosis not present

## 2017-11-01 DIAGNOSIS — I69319 Unspecified symptoms and signs involving cognitive functions following cerebral infarction: Secondary | ICD-10-CM | POA: Diagnosis not present

## 2017-11-01 NOTE — Patient Outreach (Signed)
[facility] Care Management  11/01/2017  Kent GammaWilliam P Nolan [birth year] [id number]   EMMI- Stroke RED ON EMMI ALERT Day # 1 Date: 10/31/17 Red Alert Reason: Scheduled follow up appointment-No Problems setting up rehab-yes  Telephone call to patient.  [facility].  She is able to verify HIPAA.  She states that patient is doing good despite all that has gone on. Discussed with wife EMMI red call.  She states that she has allowed the patient to answer some and she states that he does not hear well.  Asked her about follow up appointments.  She states he has an appointment with the primary care on 11-08-17 and an eight week appointment with neurology.  Asked her about rehab.  [facility] and the therapist are scheduled to come from PT, OT, and ST.    [facility].  She states that patient has no needs at this time. Advised that CM would send letter and brochure for future reference.    Plan: RN CM will close and notify care management assistant of case status.  RN CM will send letter and brochure.   Bary Lericheionne J Devanie Galanti, RN, MSN [facility] Care Management Care Management Coordinator Direct Line [phone] Toll Free: [phone]  Fax: [phone]

## 2017-11-02 ENCOUNTER — Telehealth: Payer: Self-pay | Admitting: Internal Medicine

## 2017-11-02 DIAGNOSIS — R531 Weakness: Secondary | ICD-10-CM | POA: Diagnosis not present

## 2017-11-02 NOTE — Telephone Encounter (Signed)
Copied from CRM [phone]. Topic: Quick Communication - See Telephone Encounter >> Nov 02, 2017  8:44 AM Trula SladeWalter, Linda F wrote: CRM for notification. See Telephone encounter for:  11/02/17.  Free a Physical Therapist w/[facility]  [phone] needs a verbal order for Physical Therapy for another five weeks.  1W1, [id number], [id number].  Also, he wanted to report that the following medications that the patient is taking are having interactions with each other:  1) aspirin 81 MG tablet  2) apixaban (ELIQUIS) 2.5 MG TABS tablet.  Also, the patient is using Santyl topical solution on the right leg, and is also taking a Multi Vitamin, Centrum Silver. A speech therapist is scheduled to see the patient next week, and a occupational therapist is scheduled to see the patient this week.  The Physical Therapist would like a call back concerning this information.

## 2017-11-02 NOTE — Telephone Encounter (Signed)
Ok for verbal.  Ok to cont all meds and tx as prescribed, thanks

## 2017-11-02 NOTE — Telephone Encounter (Signed)
Notified Sree w/MD response.../lmb 

## 2017-11-02 NOTE — Telephone Encounter (Signed)
Per chart pt is scheduled for hosp f/u on 11/08/17...Raechel Chute/lmb

## 2017-11-03 ENCOUNTER — Telehealth: Payer: Self-pay | Admitting: Internal Medicine

## 2017-11-03 NOTE — Telephone Encounter (Signed)
Ok for verbal 

## 2017-11-03 NOTE — Telephone Encounter (Signed)
Copied from CRM #[id number]. Topic: Inquiry °>> Nov 03, 2017  1:24 PM Robinson, Andra M wrote: °Reason for CRM: Don of [facility] called requesting OT Once a Week for 4 Weeks for ADL Transfers and Exercise. DC will occur when goals are met or maximum potential is reached. ° Don's phone number is [phone].         °

## 2017-11-03 NOTE — Telephone Encounter (Signed)
Notified don w/MD response.Marland Kitchen.Raechel Chute/lmb

## 2017-11-06 DIAGNOSIS — I2510 Atherosclerotic heart disease of native coronary artery without angina pectoris: Secondary | ICD-10-CM | POA: Diagnosis not present

## 2017-11-06 DIAGNOSIS — I69319 Unspecified symptoms and signs involving cognitive functions following cerebral infarction: Secondary | ICD-10-CM | POA: Diagnosis not present

## 2017-11-06 DIAGNOSIS — E1122 Type 2 diabetes mellitus with diabetic chronic kidney disease: Secondary | ICD-10-CM | POA: Diagnosis not present

## 2017-11-06 DIAGNOSIS — I130 Hypertensive heart and chronic kidney disease with heart failure and stage 1 through stage 4 chronic kidney disease, or unspecified chronic kidney disease: Secondary | ICD-10-CM | POA: Diagnosis not present

## 2017-11-06 DIAGNOSIS — I509 Heart failure, unspecified: Secondary | ICD-10-CM | POA: Diagnosis not present

## 2017-11-07 ENCOUNTER — Telehealth: Payer: Self-pay | Admitting: Internal Medicine

## 2017-11-07 DIAGNOSIS — E1122 Type 2 diabetes mellitus with diabetic chronic kidney disease: Secondary | ICD-10-CM | POA: Diagnosis not present

## 2017-11-07 DIAGNOSIS — I69319 Unspecified symptoms and signs involving cognitive functions following cerebral infarction: Secondary | ICD-10-CM | POA: Diagnosis not present

## 2017-11-07 DIAGNOSIS — I2510 Atherosclerotic heart disease of native coronary artery without angina pectoris: Secondary | ICD-10-CM | POA: Diagnosis not present

## 2017-11-07 DIAGNOSIS — I130 Hypertensive heart and chronic kidney disease with heart failure and stage 1 through stage 4 chronic kidney disease, or unspecified chronic kidney disease: Secondary | ICD-10-CM | POA: Diagnosis not present

## 2017-11-07 DIAGNOSIS — I509 Heart failure, unspecified: Secondary | ICD-10-CM | POA: Diagnosis not present

## 2017-11-07 NOTE — Telephone Encounter (Signed)
Copied from CRM [phone]. Topic: General - Other >> Nov 07, 2017  9:28 AM Gerrianne ScalePayne, [name] wrote: Reason for CRM: Gertie Baronachel [facility] [phone] calling to give BP reading for yesterday afternoon at 5:15 was 151/91

## 2017-11-08 ENCOUNTER — Ambulatory Visit (INDEPENDENT_AMBULATORY_CARE_PROVIDER_SITE_OTHER): Payer: Medicare HMO | Admitting: Internal Medicine

## 2017-11-08 ENCOUNTER — Encounter: Payer: Self-pay | Admitting: Internal Medicine

## 2017-11-08 VITALS — BP 116/78 | HR 85 | Temp 97.7°F | Ht 67.0 in | Wt 179.0 lb

## 2017-11-08 DIAGNOSIS — N183 Chronic kidney disease, stage 3 unspecified: Secondary | ICD-10-CM

## 2017-11-08 DIAGNOSIS — E119 Type 2 diabetes mellitus without complications: Secondary | ICD-10-CM

## 2017-11-08 DIAGNOSIS — I10 Essential (primary) hypertension: Secondary | ICD-10-CM

## 2017-11-08 DIAGNOSIS — I63411 Cerebral infarction due to embolism of right middle cerebral artery: Secondary | ICD-10-CM

## 2017-11-08 NOTE — Telephone Encounter (Signed)
FYI

## 2017-11-08 NOTE — Telephone Encounter (Signed)
Richardo Hanksachael w/ [facility] [phone]- [phone]   Speech Therapist called in to make provider aware that pt declined Speech Therapy.

## 2017-11-08 NOTE — Patient Instructions (Addendum)
OK to stop the metformin due to the kidneys slowing  Please continue all other medications as before, and refills have been done if requested.  Please have the pharmacy call with any other refills you may need.  Please continue your efforts at being more active, low cholesterol diet, and weight control.  You are otherwise up to date with prevention measures today.  Please keep your appointments with your specialists as you may have planned  Please return in 3 months, or sooner if needed

## 2017-11-08 NOTE — Telephone Encounter (Signed)
Ok , this is noted - BP was low normal today, no changes made

## 2017-11-08 NOTE — Progress Notes (Signed)
Subjective:    Patient ID: Kent Nolan, male    DOB: [birth year], 84 y.o.   MRN: [id number]  HPI  Here to f/u recent hospn with -  Stroke (cerebrum) [facility])   Middle cerebral artery embolism, right   Gait disturbance, post-stroke   Cognitive deficit, post-stroke Intracranial Atherosclerosis Dysphagia;  Also with hx of  Acute Respiratory Failure AFIB, Chronic Heart Failure CKD Anemia Thrombocytopenia Hypertension Hyperlipidemia Diabetes Mellitus Undiagnosed Parkinson's Pt denies chest pain, increased sob or doe, wheezing, orthopnea, PND, increased LE swelling, palpitations, dizziness or syncope.  Pt denies new neurological symptoms such as new headache, or facial or extremity weakness or numbness   Pt denies polydipsia, polyuria.  Getting PT and OT ,and ST.  No signficant cognitive deficit remaining per wife, believes he is back to baseline.  No new complaints or interval hx.  Good med compliance including statin without worsening LE weakness.  Does have some RUE/RLE weakness persists.  Denies HA or worsening swallow.  Past Medical History:  Diagnosis Date  . BRADYCARDIA 08/04/2010   s/p PPM since 2011 - generator change 12/03/13  . Chronic kidney disease    followed by nephrology  . Coronary artery disease    remote subendo MI in February 2005 with stents to RCA &LCX; s/p scute thrombosis of both stents in February 2010 with cardiogenic shock - s/p BMS to both lesions; follow up cath in September 2010 was satisfactory.  He is medically managed with aspirin/Plavix  . GERD (gastroesophageal reflux disease)   . High risk medication use    on amiodarone for PAF/VT since February 2010  . HTN (hypertension)   . Hyperlipidemia   . Obesity   . Pacemaker   . PAF (paroxysmal atrial fibrillation) (HCC)    s/p cardioversion in August 2011  . Type II or unspecified type diabetes mellitus without mention of complication, uncontrolled    Past Surgical History:  Procedure Laterality Date   . EP IMPLANTABLE DEVICE N/A 02/25/2015   Procedure: Lead Revision/Repair;  Surgeon: [name], MD;  Location: [facility] INVASIVE CV LAB;  Service: Cardiovascular;  Laterality: N/A;  . IR CT HEAD LTD  10/23/2017  . IR PERCUTANEOUS ART THROMBECTOMY/INFUSION INTRACRANIAL INC DIAG ANGIO  10/23/2017  . PACEMAKER GENERATOR CHANGE  12/03/13   MDT Adapta L generator change for premature ERI by Dr Johney Frame  . PACEMAKER INSERTION  May 2011  . PERMANENT PACEMAKER GENERATOR CHANGE N/A 12/03/2013   Procedure: PERMANENT PACEMAKER GENERATOR CHANGE;  Surgeon: [name], MD;  Location: [facility] CATH LAB;  Service: Cardiovascular;  Laterality: N/A;  . RADIOLOGY WITH ANESTHESIA N/A 10/23/2017   Procedure: RADIOLOGY WITH ANESTHESIA;  Surgeon: [name], MD;  Location: [facility] OR;  Service: Radiology;  Laterality: N/A;    reports that he has quit smoking. His smoking use included cigarettes. He smoked 3.00 packs per day. he has never used smokeless tobacco. He reports that he does not drink alcohol or use drugs. family history includes Diabetes in his father; Heart disease in his mother; Stomach cancer in his father. Allergies  Allergen Reactions  . Penicillins Other (See Comments)    Reaction unknown  . Sulfonamide Derivatives Other (See Comments)    Reaction unknown   Current Outpatient Medications on File Prior to Visit  Medication Sig Dispense Refill  . amiodarone (PACERONE) 200 MG tablet TAKE 1/2 TABLET (100mg ) BY MOUTH EVERY DAY 15 tablet 9  . apixaban (ELIQUIS) 2.5 MG TABS tablet Take 1 tablet (2.5 mg total) by mouth  2 (two) times daily. 60 tablet 1  . aspirin 81 MG tablet Take 81 mg by mouth daily.      . Cholecalciferol (VITAMIN D-3 PO) Take 1 tablet by mouth daily.    . famotidine (PEPCID) 40 MG tablet Take 1 tablet (40 mg total) by mouth daily. 30 tablet 11  . fenofibrate (TRICOR) 145 MG tablet Take 1 tablet (145 mg total) by mouth daily. 90 tablet 1  . Multiple Vitamin (MULTIVITAMIN) tablet Take 1  tablet by mouth daily.      . nitroGLYCERIN (NITROSTAT) 0.4 MG SL tablet Place 1 tablet (0.4 mg total) under the tongue every 5 (five) minutes as needed. For chest pain 25 tablet 5  . rosuvastatin (CRESTOR) 20 MG tablet TAKE 1 TABLET (20mg ) BY MOUTH EVERY DAY 90 tablet 3   No current facility-administered medications on file prior to visit.    Review of Systems  Constitutional: Negative for other unusual diaphoresis or sweats HENT: Negative for ear discharge or swelling Eyes: Negative for other worsening visual disturbances Respiratory: Negative for stridor or other swelling  Gastrointestinal: Negative for worsening distension or other blood Genitourinary: Negative for retention or other urinary change Musculoskeletal: Negative for other MSK pain or swelling Skin: Negative for color change or other new lesions Neurological: Negative for worsening tremors and other numbness  Psychiatric/Behavioral: Negative for worsening agitation or other fatigue All other system neg per pt    Objective:   Physical Exam BP 116/78   Pulse 85   Temp 97.7 F (36.5 C) (Oral)   Ht 5\' 7"  (1.702 m)   Wt 179 lb (81.2 kg)   SpO2 99%   BMI 28.04 kg/m  VS noted,  Constitutional: Pt appears in NAD HENT: Head: NCAT.  Right Ear: External ear normal.  Left Ear: External ear normal.  Eyes: . Pupils are equal, round, and reactive to light. Conjunctivae and EOM are normal Nose: without d/c or deformity Neck: Neck supple. Gross normal ROM Cardiovascular: Normal rate and regular rhythm.   Pulmonary/Chest: Effort normal and breath sounds without rales or wheezing.  Abd:  Soft, NT, ND, + BS, no organomegaly Neurological: Pt is alert. At baseline orientation, motor grossly intact Skin: Skin is warm. No rashes, other new lesions, no LE edema Psychiatric: Pt behavior is normal without agitation  No other exam findings    Assessment & Plan:

## 2017-11-09 DIAGNOSIS — I509 Heart failure, unspecified: Secondary | ICD-10-CM | POA: Diagnosis not present

## 2017-11-09 DIAGNOSIS — I130 Hypertensive heart and chronic kidney disease with heart failure and stage 1 through stage 4 chronic kidney disease, or unspecified chronic kidney disease: Secondary | ICD-10-CM | POA: Diagnosis not present

## 2017-11-09 DIAGNOSIS — I69319 Unspecified symptoms and signs involving cognitive functions following cerebral infarction: Secondary | ICD-10-CM | POA: Diagnosis not present

## 2017-11-09 DIAGNOSIS — E1122 Type 2 diabetes mellitus with diabetic chronic kidney disease: Secondary | ICD-10-CM | POA: Diagnosis not present

## 2017-11-09 DIAGNOSIS — I2510 Atherosclerotic heart disease of native coronary artery without angina pectoris: Secondary | ICD-10-CM | POA: Diagnosis not present

## 2017-11-10 ENCOUNTER — Emergency Department (HOSPITAL_BASED_OUTPATIENT_CLINIC_OR_DEPARTMENT_OTHER): Admit: 2017-11-10 | Discharge: 2017-11-10 | Disposition: A | Discharge status: Home / Self Care | Payer: Medicare HMO

## 2017-11-10 ENCOUNTER — Emergency Department (HOSPITAL_COMMUNITY)
Admission: EM | Admit: 2017-11-10 | Discharge: 2017-11-10 | Disposition: A | Discharge status: Home / Self Care | Payer: Medicare HMO | Attending: Emergency Medicine | Admitting: Emergency Medicine

## 2017-11-10 ENCOUNTER — Encounter (HOSPITAL_COMMUNITY): Payer: Self-pay | Admitting: Emergency Medicine

## 2017-11-10 DIAGNOSIS — E1122 Type 2 diabetes mellitus with diabetic chronic kidney disease: Secondary | ICD-10-CM | POA: Diagnosis not present

## 2017-11-10 DIAGNOSIS — L03314 Cellulitis of groin: Secondary | ICD-10-CM | POA: Diagnosis not present

## 2017-11-10 DIAGNOSIS — N189 Chronic kidney disease, unspecified: Secondary | ICD-10-CM | POA: Insufficient documentation

## 2017-11-10 DIAGNOSIS — Z7901 Long term (current) use of anticoagulants: Secondary | ICD-10-CM | POA: Insufficient documentation

## 2017-11-10 DIAGNOSIS — Z950 Presence of cardiac pacemaker: Secondary | ICD-10-CM | POA: Diagnosis not present

## 2017-11-10 DIAGNOSIS — I129 Hypertensive chronic kidney disease with stage 1 through stage 4 chronic kidney disease, or unspecified chronic kidney disease: Secondary | ICD-10-CM | POA: Insufficient documentation

## 2017-11-10 DIAGNOSIS — Z87891 Personal history of nicotine dependence: Secondary | ICD-10-CM | POA: Insufficient documentation

## 2017-11-10 DIAGNOSIS — Z7982 Long term (current) use of aspirin: Secondary | ICD-10-CM | POA: Diagnosis not present

## 2017-11-10 DIAGNOSIS — I729 Aneurysm of unspecified site: Secondary | ICD-10-CM

## 2017-11-10 DIAGNOSIS — Z79899 Other long term (current) drug therapy: Secondary | ICD-10-CM | POA: Diagnosis not present

## 2017-11-10 DIAGNOSIS — I2510 Atherosclerotic heart disease of native coronary artery without angina pectoris: Secondary | ICD-10-CM | POA: Insufficient documentation

## 2017-11-10 DIAGNOSIS — I509 Heart failure, unspecified: Secondary | ICD-10-CM | POA: Diagnosis not present

## 2017-11-10 DIAGNOSIS — I130 Hypertensive heart and chronic kidney disease with heart failure and stage 1 through stage 4 chronic kidney disease, or unspecified chronic kidney disease: Secondary | ICD-10-CM | POA: Diagnosis not present

## 2017-11-10 DIAGNOSIS — I69319 Unspecified symptoms and signs involving cognitive functions following cerebral infarction: Secondary | ICD-10-CM | POA: Diagnosis not present

## 2017-11-10 DIAGNOSIS — L988 Other specified disorders of the skin and subcutaneous tissue: Secondary | ICD-10-CM | POA: Diagnosis present

## 2017-11-10 LAB — CBC WITH DIFFERENTIAL/PLATELET
BASOS ABS: 0.1 10*3/uL (ref 0.0–0.1)
Basophils Relative: 1 %
EOS ABS: 0.3 10*3/uL (ref 0.0–0.7)
EOS PCT: 7 %
HCT: 38.4 % — ABNORMAL LOW (ref 39.0–52.0)
HEMOGLOBIN: 12.6 g/dL — AB (ref 13.0–17.0)
LYMPHS ABS: 1 10*3/uL (ref 0.7–4.0)
LYMPHS PCT: 23 %
MCH: 32.6 pg (ref 26.0–34.0)
MCHC: 32.8 g/dL (ref 30.0–36.0)
MCV: 99.2 fL (ref 78.0–100.0)
Monocytes Absolute: 0.3 10*3/uL (ref 0.1–1.0)
Monocytes Relative: 7 %
NEUTROS PCT: 62 %
Neutro Abs: 2.7 10*3/uL (ref 1.7–7.7)
PLATELETS: 167 10*3/uL (ref 150–400)
RBC: 3.87 MIL/uL — AB (ref 4.22–5.81)
RDW: 13.9 % (ref 11.5–15.5)
WBC: 4.3 10*3/uL (ref 4.0–10.5)

## 2017-11-10 LAB — COMPREHENSIVE METABOLIC PANEL
ALT: 18 U/L (ref 17–63)
AST: 29 U/L (ref 15–41)
Albumin: 3.6 g/dL (ref 3.5–5.0)
Alkaline Phosphatase: 75 U/L (ref 38–126)
Anion gap: 10 (ref 5–15)
BILIRUBIN TOTAL: 0.7 mg/dL (ref 0.3–1.2)
BUN: 27 mg/dL — ABNORMAL HIGH (ref 6–20)
CHLORIDE: 104 mmol/L (ref 101–111)
CO2: 26 mmol/L (ref 22–32)
CREATININE: 1.95 mg/dL — AB (ref 0.61–1.24)
Calcium: 9.2 mg/dL (ref 8.9–10.3)
GFR, EST AFRICAN AMERICAN: 34 mL/min — AB (ref >60)
GFR, EST NON AFRICAN AMERICAN: 29 mL/min — AB (ref >60)
Glucose, Bld: 152 mg/dL — ABNORMAL HIGH (ref 65–99)
POTASSIUM: 4 mmol/L (ref 3.5–5.1)
Sodium: 140 mmol/L (ref 135–145)
Total Protein: 6.4 g/dL — ABNORMAL LOW (ref 6.5–8.1)

## 2017-11-10 LAB — I-STAT CG4 LACTIC ACID, ED: LACTIC ACID, VENOUS: 0.87 mmol/L (ref 0.5–1.9)

## 2017-11-10 LAB — URINALYSIS, ROUTINE W REFLEX MICROSCOPIC
Bilirubin Urine: NEGATIVE
Glucose, UA: NEGATIVE mg/dL
HGB URINE DIPSTICK: NEGATIVE
Ketones, ur: NEGATIVE mg/dL
LEUKOCYTES UA: NEGATIVE
Nitrite: NEGATIVE
PROTEIN: NEGATIVE mg/dL
SPECIFIC GRAVITY, URINE: 1.014 (ref 1.005–1.030)
pH: 6 (ref 5.0–8.0)

## 2017-11-10 MED ORDER — NYSTATIN 100000 UNIT/GM EX CREA: TOPICAL_CREAM | CUTANEOUS | 0 refills | Status: DC

## 2017-11-10 MED ORDER — SODIUM CHLORIDE 0.9 % IV BOLUS (SEPSIS)
500.0000 mL | Freq: Once | INTRAVENOUS | Status: AC
  Administered 2017-11-10: 500 mL via INTRAVENOUS

## 2017-11-10 MED ORDER — VANCOMYCIN HCL IN DEXTROSE 1-5 GM/200ML-% IV SOLN
1000.0000 mg | Freq: Once | INTRAVENOUS | Status: AC
  Administered 2017-11-10: 1000 mg via INTRAVENOUS
  Filled 2017-11-10: qty 200

## 2017-11-10 MED ORDER — DOXYCYCLINE HYCLATE 100 MG PO CAPS: 100.0000 mg | ORAL_CAPSULE | Freq: Two times a day (BID) | ORAL | 0 refills | Status: DC

## 2017-11-10 NOTE — Discharge Instructions (Signed)
Take doxycycline twice daily for a week.   Use nystatin cream to the groin twice daily.   Return to ED in 2 days for wound check.   Return to ER earlier if you have fever, worse drainage, severe pain.

## 2017-11-10 NOTE — ED Triage Notes (Signed)
Pt to ER for evaluation of infected right groin incision, which was made back in February for intervention of ischemic stroke. Purulent drainage. Redness. Foul odor.

## 2017-11-10 NOTE — Progress Notes (Signed)
Right groin evaluation for a pseudoaneurysm. No evidence of a pseudoaneurysm or A-V fistula. [place] Selita Staiger,RVS 11/10/2017 7:34 PM

## 2017-11-10 NOTE — ED Notes (Signed)
ED Provider at bedside. 

## 2017-11-10 NOTE — ED Provider Notes (Signed)
[facility] EMERGENCY DEPARTMENT Provider Note   CSN: [id number] Arrival date & time: 11/10/17  1558     History   Chief Complaint Chief Complaint  Patient presents with  . Wound Infection    HPI Kent Nolan is a 84 y.o. male history of CKD, CAD, reflux, pacemaker, A. fib on Eliquis, diabetes here presenting with possible skin infection.  Patient was recently admitted for stroke and had embolectomy for stroke.  Patient was discharged from the hospital about 10 days ago.  While in physical therapy, the physical therapist noticed that there is some discharge and foul-smelling odor on the right groin.  Patient denies any fevers or chills.  Family noticed some drainage from the right groin cath site for the last several days.  Patient is not currently on any oral antibiotics, just antibiotic cream at home. Denies worsening leg swelling or SOB or chest pain.   The history is provided by the patient.    Past Medical History:  Diagnosis Date  . BRADYCARDIA 08/04/2010   s/p PPM since 2011 - generator change 12/03/13  . Chronic kidney disease    followed by nephrology  . Coronary artery disease    remote subendo MI in February 2005 with stents to RCA &LCX; s/p scute thrombosis of both stents in February 2010 with cardiogenic shock - s/p BMS to both lesions; follow up cath in September 2010 was satisfactory.  He is medically managed with aspirin/Plavix  . GERD (gastroesophageal reflux disease)   . High risk medication use    on amiodarone for PAF/VT since February 2010  . HTN (hypertension)   . Hyperlipidemia   . Obesity   . Pacemaker   . PAF (paroxysmal atrial fibrillation) (HCC)    s/p cardioversion in August 2011  . Type II or unspecified type diabetes mellitus without mention of complication, uncontrolled     Patient Active Problem List   Diagnosis Date Noted  . Gait disturbance, post-stroke   . Cognitive deficit, post-stroke   . Stroke (cerebrum) (HCC)  10/23/2017  . 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 Lower back pain 03/30/2014  . Encounter for preventative adult health care exam with abnormal findings 11/26/2013  . Diabetes (HCC) 11/26/2013  . Coronary artery disease   . PAF (paroxysmal atrial fibrillation) (HCC)   . GERD (gastroesophageal reflux disease)   . Hyperlipidemia   . Chronic kidney disease   . Pacemaker   . Essential hypertension, benign 08/04/2010  . Sick sinus syndrome (HCC) 08/04/2010    Past Surgical History:  Procedure Laterality Date  . EP IMPLANTABLE DEVICE N/A 02/25/2015   Procedure: Lead Revision/Repair;  Surgeon: [name], MD;  Location: [facility] INVASIVE CV LAB;  Service: Cardiovascular;  Laterality: N/A;  . IR CT HEAD LTD  10/23/2017  . IR PERCUTANEOUS ART THROMBECTOMY/INFUSION INTRACRANIAL INC DIAG ANGIO  10/23/2017  . PACEMAKER GENERATOR CHANGE  12/03/13   MDT Adapta L generator change for premature ERI by Dr Johney Frame  . PACEMAKER INSERTION  May 2011  . PERMANENT PACEMAKER GENERATOR CHANGE N/A 12/03/2013   Procedure: PERMANENT PACEMAKER GENERATOR CHANGE;  Surgeon: [name], MD;  Location: [facility] CATH LAB;  Service: Cardiovascular;  Laterality: N/A;  . RADIOLOGY WITH ANESTHESIA N/A 10/23/2017   Procedure: RADIOLOGY WITH ANESTHESIA;  Surgeon: [name], MD;  Location: [facility] OR;  Service: Radiology;  Laterality: N/A;       Home Medications    Prior to Admission medications  Medication Sig Start Date End Date Taking? Authorizing Provider  amiodarone (PACERONE) 200 MG tablet TAKE 1/2 TABLET (100mg ) BY MOUTH EVERY DAY 10/28/17   Beryl Meager, NP  apixaban (ELIQUIS) 2.5 MG TABS tablet Take 1 tablet (2.5 mg total) by mouth 2 (two) times daily. 10/28/17   Beryl Meager, NP  aspirin 81 MG tablet Take 81 mg by mouth daily.      [provider]  Cholecalciferol (VITAMIN D-3 PO) Take 1 tablet by mouth daily.    [provider]    famotidine (PEPCID) 40 MG tablet Take 1 tablet (40 mg total) by mouth daily. 07/06/17   Allred, Fayrene Fearing, MD  fenofibrate (TRICOR) 145 MG tablet Take 1 tablet (145 mg total) by mouth daily. 06/06/17   Allred, Fayrene Fearing, MD  Multiple Vitamin (MULTIVITAMIN) tablet Take 1 tablet by mouth daily.      [provider]  nitroGLYCERIN (NITROSTAT) 0.4 MG SL tablet Place 1 tablet (0.4 mg total) under the tongue every 5 (five) minutes as needed. For chest pain 02/27/15   Allred, Fayrene Fearing, MD  rosuvastatin (CRESTOR) 20 MG tablet TAKE 1 TABLET (20mg ) BY MOUTH EVERY DAY 10/28/17   Beryl Meager, NP    Family History Family History  Problem Relation Age of Onset  . Heart disease Mother   . Stomach cancer Father   . Diabetes Father     Social History Social History   Tobacco Use  . Smoking status: Former Smoker    Packs/day: 3.00    Types: Cigarettes  . Smokeless tobacco: Never Used  . Tobacco comment[place]  Substance Use Topics  . Alcohol use: No  . Drug use: No     Allergies   Penicillins and Sulfonamide derivatives   Review of Systems Review of Systems  Skin: Positive for wound.  All other systems reviewed and are negative.    Physical Exam Updated Vital Signs BP (!) 155/83   Pulse 70   Temp 97.7 F (36.5 C) (Oral)   Resp 15   SpO2 98%   Physical Exam  Constitutional: He is oriented to person, place, and time.  Chronically ill   HENT:  Head: Normocephalic.  Mouth/Throat: Oropharynx is clear and moist.  Eyes: Conjunctivae and EOM are normal. Pupils are equal, round, and reactive to light.  Neck: Normal range of motion. Neck supple.  Cardiovascular: Normal rate, regular rhythm and normal heart sounds.  Pulmonary/Chest: Effort normal and breath sounds normal. No stridor. No respiratory distress. He has no wheezes.  Abdominal: Soft. Bowel sounds are normal. He exhibits no distension. There is no tenderness. There is no guarding.  Musculoskeletal:  R groin  cath site with some erythema and discharge. No obvious pulsatile mass. No obvious fluctuance   Neurological: He is alert and oriented to person, place, and time.  Skin: Skin is warm.  Psychiatric: He has a normal mood and affect.  Nursing note and vitals reviewed.       ED Treatments / Results  Labs (all labs ordered are listed, but only abnormal results are displayed) Labs Reviewed  COMPREHENSIVE METABOLIC PANEL - Abnormal; Notable for the following components:      Result Value   Glucose, Bld 152 (*)    BUN 27 (*)    Creatinine, Ser 1.95 (*)    Total Protein 6.4 (*)    GFR calc non Af Amer 29 (*)    GFR calc Af Amer 34 (*)    All other  components within normal limits  CBC WITH DIFFERENTIAL/PLATELET - Abnormal; Notable for the following components:   RBC 3.87 (*)    Hemoglobin 12.6 (*)    HCT 38.4 (*)    All other components within normal limits  CULTURE, BLOOD (ROUTINE X 2)  CULTURE, BLOOD (ROUTINE X 2)  URINE CULTURE  URINALYSIS, ROUTINE W REFLEX MICROSCOPIC  I-STAT CG4 LACTIC ACID, ED    EKG  EKG Interpretation None       Radiology No results found.  Procedures Procedures (including critical care time)  Medications Ordered in ED Medications  vancomycin (VANCOCIN) IVPB 1000 mg/200 mL premix (1,000 mg Intravenous New Bag/Given 11/10/17 1914)  sodium chloride 0.9 % bolus 500 mL (500 mLs Intravenous New Bag/Given 11/10/17 1907)     Initial Impression / Assessment and Plan / ED Course  I have reviewed the triage vital signs and the nursing notes.  Pertinent labs & imaging results that were available during my care of the patient were reviewed by me and considered in my medical decision making (see chart for details).    Kent Nolan is a 84 y.o. male here with R groin pain and discharge at the cath site. Afebrile currently, doesn't appear septic. US showed no pseudoaneurysm or any AV fistula. I think likely yeast infection or mild cellulitis. Will get  labs, lactate, cultures. Will give vancomycin IV.   8:54 PM Lactate nl. WBC nl. UA nl. I think likely mild cellulitis vs yeast infection. Given vancomycin. Will dc home with doxycycline, nystatin. Will bring him back for wound check in 2 days.    Final Clinical Impressions(s) / ED Diagnoses   Final diagnoses:  None    ED Discharge Orders    None       Charlynne PanderYao, David Hsienta, MD 11/10/17 2055

## 2017-11-11 NOTE — Assessment & Plan Note (Signed)
stable overall by history and exam, recent data reviewed with pt, and pt to continue medical treatment as before,  to f/u any worsening symptoms or concerns  

## 2017-11-11 NOTE — Assessment & Plan Note (Signed)
Stable to improved, cont asa, statin,  to f/u any worsening symptoms or concerns

## 2017-11-11 NOTE — Assessment & Plan Note (Signed)
stable overall by history and exam, recent data reviewed with pt, and pt to continue medical treatment as before,  to f/u any worsening symptoms or concerns, to avoid metformin due to this

## 2017-11-12 LAB — URINE CULTURE

## 2017-11-13 ENCOUNTER — Emergency Department (HOSPITAL_COMMUNITY)
Admission: EM | Admit: 2017-11-13 | Discharge: 2017-11-13 | Disposition: A | Discharge status: Home / Self Care | Payer: Medicare HMO | Attending: Emergency Medicine | Admitting: Emergency Medicine

## 2017-11-13 ENCOUNTER — Other Ambulatory Visit: Payer: Self-pay

## 2017-11-13 ENCOUNTER — Encounter (HOSPITAL_COMMUNITY): Payer: Self-pay | Admitting: Emergency Medicine

## 2017-11-13 DIAGNOSIS — Z950 Presence of cardiac pacemaker: Secondary | ICD-10-CM | POA: Diagnosis not present

## 2017-11-13 DIAGNOSIS — Z87891 Personal history of nicotine dependence: Secondary | ICD-10-CM | POA: Diagnosis not present

## 2017-11-13 DIAGNOSIS — N189 Chronic kidney disease, unspecified: Secondary | ICD-10-CM | POA: Insufficient documentation

## 2017-11-13 DIAGNOSIS — Z7901 Long term (current) use of anticoagulants: Secondary | ICD-10-CM | POA: Diagnosis not present

## 2017-11-13 DIAGNOSIS — I129 Hypertensive chronic kidney disease with stage 1 through stage 4 chronic kidney disease, or unspecified chronic kidney disease: Secondary | ICD-10-CM | POA: Diagnosis not present

## 2017-11-13 DIAGNOSIS — Z79899 Other long term (current) drug therapy: Secondary | ICD-10-CM | POA: Insufficient documentation

## 2017-11-13 DIAGNOSIS — B356 Tinea cruris: Secondary | ICD-10-CM | POA: Insufficient documentation

## 2017-11-13 DIAGNOSIS — E1122 Type 2 diabetes mellitus with diabetic chronic kidney disease: Secondary | ICD-10-CM | POA: Diagnosis not present

## 2017-11-13 DIAGNOSIS — Z5189 Encounter for other specified aftercare: Secondary | ICD-10-CM

## 2017-11-13 DIAGNOSIS — Z4801 Encounter for change or removal of surgical wound dressing: Secondary | ICD-10-CM | POA: Diagnosis not present

## 2017-11-13 DIAGNOSIS — Z7982 Long term (current) use of aspirin: Secondary | ICD-10-CM | POA: Insufficient documentation

## 2017-11-13 DIAGNOSIS — Z7984 Long term (current) use of oral hypoglycemic drugs: Secondary | ICD-10-CM | POA: Insufficient documentation

## 2017-11-13 DIAGNOSIS — I2510 Atherosclerotic heart disease of native coronary artery without angina pectoris: Secondary | ICD-10-CM | POA: Diagnosis not present

## 2017-11-13 DIAGNOSIS — L539 Erythematous condition, unspecified: Secondary | ICD-10-CM | POA: Diagnosis present

## 2017-11-13 LAB — CBC WITH DIFFERENTIAL/PLATELET
Basophils Absolute: 0 10*3/uL (ref 0.0–0.1)
Basophils Relative: 1 %
Eosinophils Absolute: 0.3 10*3/uL (ref 0.0–0.7)
Eosinophils Relative: 5 %
HCT: 40.2 % (ref 39.0–52.0)
Hemoglobin: 12.8 g/dL — ABNORMAL LOW (ref 13.0–17.0)
Lymphocytes Relative: 20 %
Lymphs Abs: 1.1 10*3/uL (ref 0.7–4.0)
MCH: 31.9 pg (ref 26.0–34.0)
MCHC: 31.8 g/dL (ref 30.0–36.0)
MCV: 100.2 fL — ABNORMAL HIGH (ref 78.0–100.0)
Monocytes Absolute: 0.4 10*3/uL (ref 0.1–1.0)
Monocytes Relative: 7 %
Neutro Abs: 3.7 10*3/uL (ref 1.7–7.7)
Neutrophils Relative %: 67 %
Platelets: 143 10*3/uL — ABNORMAL LOW (ref 150–400)
RBC: 4.01 MIL/uL — ABNORMAL LOW (ref 4.22–5.81)
RDW: 13.8 % (ref 11.5–15.5)
WBC: 5.5 10*3/uL (ref 4.0–10.5)

## 2017-11-13 LAB — BASIC METABOLIC PANEL
Anion gap: 9 (ref 5–15)
BUN: 30 mg/dL — ABNORMAL HIGH (ref 6–20)
CO2: 22 mmol/L (ref 22–32)
Calcium: 9 mg/dL (ref 8.9–10.3)
Chloride: 108 mmol/L (ref 101–111)
Creatinine, Ser: 1.84 mg/dL — ABNORMAL HIGH (ref 0.61–1.24)
GFR calc Af Amer: 37 mL/min — ABNORMAL LOW (ref >60)
GFR calc non Af Amer: 32 mL/min — ABNORMAL LOW (ref >60)
Glucose, Bld: 113 mg/dL — ABNORMAL HIGH (ref 65–99)
Potassium: 4.1 mmol/L (ref 3.5–5.1)
Sodium: 139 mmol/L (ref 135–145)

## 2017-11-13 LAB — I-STAT CG4 LACTIC ACID, ED: Lactic Acid, Venous: 1.81 mmol/L (ref 0.5–1.9)

## 2017-11-13 MED ORDER — MUPIROCIN CALCIUM 2 % EX CREA
TOPICAL_CREAM | Freq: Once | CUTANEOUS | Status: AC
  Administered 2017-11-13: 16:00:00 via TOPICAL
  Filled 2017-11-13 (×2): qty 15

## 2017-11-13 MED ORDER — NYSTATIN 100000 UNIT/GM EX CREA
TOPICAL_CREAM | Freq: Once | CUTANEOUS | Status: AC
  Administered 2017-11-13: 16:00:00 via TOPICAL
  Filled 2017-11-13: qty 15

## 2017-11-13 NOTE — ED Triage Notes (Signed)
Pt was here Friday and Dr Silverio LayYao had seen pt for right groin wound and told pt to come back today to have it looked at , Family states that it is looking better

## 2017-11-13 NOTE — ED Provider Notes (Signed)
Medical screening examination/treatment/procedure(s) were conducted as a shared visit with non-physician practitioner(s) and myself.  I personally evaluated the patient during the encounter.   EKG Interpretation None     Patient has had ongoing right groin wound after embolectomy 2\18\19.  He is here for recheck.  On examination, wound continues to have a beefy but clean base and margins with some fibrinous material.  Stable and healing appearance.  Malodorous discharge is candidal infection particularly where the scrotum opposes the upper thigh.  There is exudative material in this area.  Scrotum itself does not have significant edema or crepitus.  I do not suspect any secondary cellulitis.  The patient is having slow wound healing of a chronic wound but it does not appear to have any worsening or advancing secondary infection.  The patient needs improvement in management of candidal infection and opposing skin in the area of his wounds.  I have provided guidance on management of this and patient will continue his already initiated course of doxycycline with follow-up.   Arby BarrettePfeiffer, Yigit Norkus, MD 11/16/17 [phone]

## 2017-11-13 NOTE — ED Provider Notes (Addendum)
[facility] EMERGENCY DEPARTMENT Provider Note   CSN: [id number] Arrival date & time: 11/13/17  4098     History   Chief Complaint Chief Complaint  Patient presents with  . Wound Check    HPI Kent Nolan is a 84 y.o. male with history of CKD, CAD, HTN, HLD, GERD, type 2 diabetes, PAF on Eliquis and baby aspirin presents today for wound check.  3 days ago he was seen and evaluated in the ED for malodorous drainage and erythema of right embolectomy site to the right groin.  He was discharged after workup and after receiving IV vancomycin with nystatin cream and doxycycline.  [name] states that there continues to be drainage and erythema.  Patient denies any significant pain but states the areas mildly sensitive to the touch.  He denies fevers or chills.  The history is provided by the patient.    Past Medical History:  Diagnosis Date  . BRADYCARDIA 08/04/2010   s/p PPM since 2011 - generator change 12/03/13  . Chronic kidney disease    followed by nephrology  . Coronary artery disease    remote subendo MI in February 2005 with stents to RCA &LCX; s/p scute thrombosis of both stents in February 2010 with cardiogenic shock - s/p BMS to both lesions; follow up cath in September 2010 was satisfactory.  He is medically managed with aspirin/Plavix  . GERD (gastroesophageal reflux disease)   . High risk medication use    on amiodarone for PAF/VT since February 2010  . HTN (hypertension)   . Hyperlipidemia   . Obesity   . Pacemaker   . PAF (paroxysmal atrial fibrillation) (HCC)    s/p cardioversion in August 2011  . Type II or unspecified type diabetes mellitus without mention of complication, uncontrolled     Patient Active Problem List   Diagnosis Date Noted  . Gait disturbance, post-stroke   . Cognitive deficit, post-stroke   . Stroke (cerebrum) (HCC) 10/23/2017  . 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 Pacemaker lead failure  01/12/2015  . Lower back pain 03/30/2014  . Encounter for preventative adult health care exam with abnormal findings 11/26/2013  . Diabetes (HCC) 11/26/2013  . Coronary artery disease   . PAF (paroxysmal atrial fibrillation) (HCC)   . GERD (gastroesophageal reflux disease)   . Hyperlipidemia   . Chronic kidney disease   . Pacemaker   . Essential hypertension, benign 08/04/2010  . Sick sinus syndrome (HCC) 08/04/2010    Past Surgical History:  Procedure Laterality Date  . EP IMPLANTABLE DEVICE N/A 02/25/2015   Procedure: Lead Revision/Repair;  Surgeon: [name], MD;  Location: [facility] INVASIVE CV LAB;  Service: Cardiovascular;  Laterality: N/A;  . IR CT HEAD LTD  10/23/2017  . IR PERCUTANEOUS ART THROMBECTOMY/INFUSION INTRACRANIAL INC DIAG ANGIO  10/23/2017  . PACEMAKER GENERATOR CHANGE  12/03/13   MDT Adapta L generator change for premature ERI by Dr Johney Frame  . PACEMAKER INSERTION  May 2011  . PERMANENT PACEMAKER GENERATOR CHANGE N/A 12/03/2013   Procedure: PERMANENT PACEMAKER GENERATOR CHANGE;  Surgeon: [name], MD;  Location: [facility] CATH LAB;  Service: Cardiovascular;  Laterality: N/A;  . RADIOLOGY WITH ANESTHESIA N/A 10/23/2017   Procedure: RADIOLOGY WITH ANESTHESIA;  Surgeon: [name], MD;  Location: [facility] OR;  Service: Radiology;  Laterality: N/A;       Home Medications    Prior to Admission medications   Medication Sig Start Date End Date Taking? Authorizing Provider  amiodarone (PACERONE) 200 MG tablet TAKE 1/2 TABLET (100mg ) BY MOUTH EVERY DAY 10/28/17  Yes Costello, Lamar BlinksMary A, NP  apixaban (ELIQUIS) 2.5 MG TABS tablet Take 1 tablet (2.5 mg total) by mouth 2 (two) times daily. 10/28/17  Yes Costello, Lamar BlinksMary A, NP  aspirin 81 MG tablet Take 81 mg by mouth daily.     Yes [provider]  Cholecalciferol (VITAMIN D-3 PO) Take 1 tablet by mouth daily.   Yes [provider]  doxycycline (VIBRAMYCIN) 100 MG capsule Take 1 capsule (100 mg total) by mouth 2 (two)  times daily. One po bid x 7 days Patient taking differently: Take 100 mg by mouth 2 (two) times daily.  11/10/17  Yes Charlynne PanderYao, David Hsienta, MD  famotidine (PEPCID) 40 MG tablet Take 1 tablet (40 mg total) by mouth daily. 07/06/17  Yes Allred, Fayrene FearingJames, MD  fenofibrate (TRICOR) 145 MG tablet Take 1 tablet (145 mg total) by mouth daily. 06/06/17  Yes Allred, Fayrene FearingJames, MD  Multiple Vitamin (MULTIVITAMIN) tablet Take 1 tablet by mouth daily.     Yes [provider]  nitroGLYCERIN (NITROSTAT) 0.4 MG SL tablet Place 1 tablet (0.4 mg total) under the tongue every 5 (five) minutes as needed. For chest pain 02/27/15  Yes Allred, Fayrene FearingJames, MD  nystatin cream (MYCOSTATIN) Apply to affected area 2 times daily Patient taking differently: Apply 1 application topically 2 (two) times daily.  11/10/17  Yes Charlynne PanderYao, David Hsienta, MD  rosuvastatin (CRESTOR) 20 MG tablet TAKE 1 TABLET (20mg ) BY MOUTH EVERY DAY Patient taking differently: Take 20 mg by mouth every evening.  10/28/17  Yes Costello, Lamar BlinksMary A, NP    Family History Family History  Problem Relation Age of Onset  . Heart disease Mother   . Stomach cancer Father   . Diabetes Father     Social History Social History   Tobacco Use  . Smoking status: Former Smoker    Packs/day: 3.00    Types: Cigarettes  . Smokeless tobacco: Never Used  . Tobacco comment: back [place]  Substance Use Topics  . Alcohol use: No  . Drug use: No     Allergies   Penicillins and Sulfonamide derivatives   Review of Systems Review of Systems  Constitutional: Negative for chills, diaphoresis and fever.  Skin: Positive for color change, rash and wound.     Physical Exam Updated Vital Signs BP (!) 141/77 (BP Location: Left Arm)   Pulse 68   Temp 97.7 F (36.5 C) (Oral)   Resp 16   SpO2 100%   Physical Exam  Constitutional: He appears well-developed and well-nourished. No distress.  HENT:  Head: Normocephalic and atraumatic.  Eyes: Conjunctivae are normal.  Right eye exhibits no discharge. Left eye exhibits no discharge.  Neck: No JVD present. No tracheal deviation present.  Cardiovascular: Normal rate and intact distal pulses.  Femoral pulses intact  Pulmonary/Chest: Effort normal.  Abdominal: He exhibits no distension.  Musculoskeletal: He exhibits no edema.  Neurological: He is alert.  Skin: Skin is warm and dry. No erythema.  Approximately 4 cm linear wound with malodorous purulent drainage.  There is surrounding erythema extending all the way down into the groin with satellite lesions.  Minimally tender to palpation with no underlying induration or fluctuance.  Psychiatric: He has a normal mood and affect. His behavior is normal.  Nursing note and vitals reviewed.    ED Treatments / Results  Labs (all labs ordered are listed, but only abnormal results are  displayed) Labs Reviewed  CBC WITH DIFFERENTIAL/PLATELET - Abnormal; Notable for the following components:      Result Value   RBC 4.01 (*)    Hemoglobin 12.8 (*)    MCV 100.2 (*)    Platelets 143 (*)    All other components within normal limits  BASIC METABOLIC PANEL - Abnormal; Notable for the following components:   Glucose, Bld 113 (*)    BUN 30 (*)    Creatinine, Ser 1.84 (*)    GFR calc non Af Amer 32 (*)    GFR calc Af Amer 37 (*)    All other components within normal limits  I-STAT CG4 LACTIC ACID, ED  I-STAT CG4 LACTIC ACID, ED    EKG  EKG Interpretation None       Radiology No results found.  Procedures Procedures (including critical care time)  Medications Ordered in ED Medications  mupirocin cream (BACTROBAN) 2 % ( Topical Given 11/13/17 1551)  nystatin cream (MYCOSTATIN) ( Topical Given 11/13/17 1551)     Initial Impression / Assessment and Plan / ED Course  I have reviewed the triage vital signs and the nursing notes.  Pertinent labs & imaging results that were available during my care of the patient were reviewed by me and considered in my  medical decision making (see chart for details).     Patient presents for reevaluation of a superficial wound to the right groin from embolectomy procedure on 10/23/17.  He is afebrile, vital signs are stable.  Physical exam shows a superficial wound with somewhat malodorous discharge with surrounding area and remainder of groin suggestive of tinea cruris.  No palpable abscess.  The wound itself appears generally well healing but requires debridement of the purulent area.  Repeat labs are at patient's baseline, no leukocytosis.  Lactate is normal.  He does not appear to be septic at this time.  He has been taking doxycycline, which I encouraged him to take in its entirety.  His wife is also been applying nystatin cream to the wound, and I instructed her to apply the nystatin cream to the entire groin and as it appears that he has an extensive yeast infection.  He was given mupirocin ointment in the ED which he will take home and apply to the wound twice daily with dressing changes.  He will follow-up with wound care for reevaluation.  Discussed indications for return to the ED.  Patient and patient's wife verbalized understanding of and agreement with plan and patient stable for discharge home at this time.  Patient seen and evaluated by Dr. Donnald Garre who agrees with assessment and plan at this time.  Final Clinical Impressions(s) / ED Diagnoses   Final diagnoses:  Visit for wound check  Tinea cruris    ED Discharge Orders    None        Bennye Alm 11/13/17 1633    Arby Barrette, MD 11/16/17 [phone]

## 2017-11-13 NOTE — Discharge Instructions (Signed)
Apply nystatin cream to the groin twice daily.  Apply gauze pads to the bilateral legs to keep the nystatin cream in place.  Apply mupirocin ointment to the wound to the right groin.  Then cover with clean gauze.  Change these dressings twice daily.  Continue taking doxycycline to completion.  You may take probiotics or yogurt to avoid upset stomach issues.  Follow-up with wound clinic for reevaluation.  Return to the emergency department if any concerning signs or symptoms develop such as spread of redness, fevers, or persistent abnormal drainage or bleeding.

## 2017-11-14 ENCOUNTER — Telehealth: Payer: Self-pay | Admitting: Internal Medicine

## 2017-11-14 DIAGNOSIS — I509 Heart failure, unspecified: Secondary | ICD-10-CM | POA: Diagnosis not present

## 2017-11-14 DIAGNOSIS — I130 Hypertensive heart and chronic kidney disease with heart failure and stage 1 through stage 4 chronic kidney disease, or unspecified chronic kidney disease: Secondary | ICD-10-CM | POA: Diagnosis not present

## 2017-11-14 DIAGNOSIS — E1122 Type 2 diabetes mellitus with diabetic chronic kidney disease: Secondary | ICD-10-CM | POA: Diagnosis not present

## 2017-11-14 DIAGNOSIS — I69319 Unspecified symptoms and signs involving cognitive functions following cerebral infarction: Secondary | ICD-10-CM | POA: Diagnosis not present

## 2017-11-14 DIAGNOSIS — I2510 Atherosclerotic heart disease of native coronary artery without angina pectoris: Secondary | ICD-10-CM | POA: Diagnosis not present

## 2017-11-14 NOTE — Telephone Encounter (Signed)
Copied from CRM [phone]. Topic: Inquiry >> Nov 14, 2017 12:45 PM Jonette EvaBarksdale, Harvey B wrote: Reason for CRM: 2 ER admissions last Friday and past Monday and yeast infection, pt has a new medicines doxycycline, nystatin cream, and bactrobane contact [facility] if needed, contact [phone] for info

## 2017-11-15 LAB — CULTURE, BLOOD (ROUTINE X 2)
CULTURE: NO GROWTH
Culture: NO GROWTH
Special Requests: ADEQUATE
Special Requests: ADEQUATE

## 2017-11-16 DIAGNOSIS — I2510 Atherosclerotic heart disease of native coronary artery without angina pectoris: Secondary | ICD-10-CM | POA: Diagnosis not present

## 2017-11-16 DIAGNOSIS — I130 Hypertensive heart and chronic kidney disease with heart failure and stage 1 through stage 4 chronic kidney disease, or unspecified chronic kidney disease: Secondary | ICD-10-CM | POA: Diagnosis not present

## 2017-11-16 DIAGNOSIS — I509 Heart failure, unspecified: Secondary | ICD-10-CM | POA: Diagnosis not present

## 2017-11-16 DIAGNOSIS — E1122 Type 2 diabetes mellitus with diabetic chronic kidney disease: Secondary | ICD-10-CM | POA: Diagnosis not present

## 2017-11-16 DIAGNOSIS — I69319 Unspecified symptoms and signs involving cognitive functions following cerebral infarction: Secondary | ICD-10-CM | POA: Diagnosis not present

## 2017-11-17 DIAGNOSIS — I130 Hypertensive heart and chronic kidney disease with heart failure and stage 1 through stage 4 chronic kidney disease, or unspecified chronic kidney disease: Secondary | ICD-10-CM | POA: Diagnosis not present

## 2017-11-17 DIAGNOSIS — I69319 Unspecified symptoms and signs involving cognitive functions following cerebral infarction: Secondary | ICD-10-CM | POA: Diagnosis not present

## 2017-11-17 DIAGNOSIS — E1122 Type 2 diabetes mellitus with diabetic chronic kidney disease: Secondary | ICD-10-CM | POA: Diagnosis not present

## 2017-11-17 DIAGNOSIS — I509 Heart failure, unspecified: Secondary | ICD-10-CM | POA: Diagnosis not present

## 2017-11-17 DIAGNOSIS — I2510 Atherosclerotic heart disease of native coronary artery without angina pectoris: Secondary | ICD-10-CM | POA: Diagnosis not present

## 2017-11-20 DIAGNOSIS — E1122 Type 2 diabetes mellitus with diabetic chronic kidney disease: Secondary | ICD-10-CM | POA: Diagnosis not present

## 2017-11-20 DIAGNOSIS — I130 Hypertensive heart and chronic kidney disease with heart failure and stage 1 through stage 4 chronic kidney disease, or unspecified chronic kidney disease: Secondary | ICD-10-CM | POA: Diagnosis not present

## 2017-11-20 DIAGNOSIS — I509 Heart failure, unspecified: Secondary | ICD-10-CM | POA: Diagnosis not present

## 2017-11-20 DIAGNOSIS — I2510 Atherosclerotic heart disease of native coronary artery without angina pectoris: Secondary | ICD-10-CM | POA: Diagnosis not present

## 2017-11-20 DIAGNOSIS — I69319 Unspecified symptoms and signs involving cognitive functions following cerebral infarction: Secondary | ICD-10-CM | POA: Diagnosis not present

## 2017-11-21 DIAGNOSIS — I2510 Atherosclerotic heart disease of native coronary artery without angina pectoris: Secondary | ICD-10-CM | POA: Diagnosis not present

## 2017-11-21 DIAGNOSIS — Z9181 History of falling: Secondary | ICD-10-CM

## 2017-11-21 DIAGNOSIS — D631 Anemia in chronic kidney disease: Secondary | ICD-10-CM | POA: Diagnosis not present

## 2017-11-21 DIAGNOSIS — Z955 Presence of coronary angioplasty implant and graft: Secondary | ICD-10-CM | POA: Diagnosis not present

## 2017-11-21 DIAGNOSIS — I130 Hypertensive heart and chronic kidney disease with heart failure and stage 1 through stage 4 chronic kidney disease, or unspecified chronic kidney disease: Secondary | ICD-10-CM | POA: Diagnosis not present

## 2017-11-21 DIAGNOSIS — E1122 Type 2 diabetes mellitus with diabetic chronic kidney disease: Secondary | ICD-10-CM | POA: Diagnosis not present

## 2017-11-21 DIAGNOSIS — Z7982 Long term (current) use of aspirin: Secondary | ICD-10-CM

## 2017-11-21 DIAGNOSIS — I69319 Unspecified symptoms and signs involving cognitive functions following cerebral infarction: Secondary | ICD-10-CM | POA: Diagnosis not present

## 2017-11-21 DIAGNOSIS — I509 Heart failure, unspecified: Secondary | ICD-10-CM | POA: Diagnosis not present

## 2017-11-21 DIAGNOSIS — R1310 Dysphagia, unspecified: Secondary | ICD-10-CM | POA: Diagnosis not present

## 2017-11-21 DIAGNOSIS — Z950 Presence of cardiac pacemaker: Secondary | ICD-10-CM

## 2017-11-21 DIAGNOSIS — E669 Obesity, unspecified: Secondary | ICD-10-CM | POA: Diagnosis not present

## 2017-11-21 DIAGNOSIS — N183 Chronic kidney disease, stage 3 (moderate): Secondary | ICD-10-CM | POA: Diagnosis not present

## 2017-11-21 DIAGNOSIS — Z7901 Long term (current) use of anticoagulants: Secondary | ICD-10-CM

## 2017-11-21 DIAGNOSIS — I482 Chronic atrial fibrillation: Secondary | ICD-10-CM | POA: Diagnosis not present

## 2017-11-21 DIAGNOSIS — I252 Old myocardial infarction: Secondary | ICD-10-CM | POA: Diagnosis not present

## 2017-11-22 ENCOUNTER — Other Ambulatory Visit: Payer: Self-pay | Admitting: Internal Medicine

## 2017-11-22 DIAGNOSIS — E1122 Type 2 diabetes mellitus with diabetic chronic kidney disease: Secondary | ICD-10-CM | POA: Diagnosis not present

## 2017-11-22 DIAGNOSIS — I2510 Atherosclerotic heart disease of native coronary artery without angina pectoris: Secondary | ICD-10-CM | POA: Diagnosis not present

## 2017-11-22 DIAGNOSIS — I509 Heart failure, unspecified: Secondary | ICD-10-CM | POA: Diagnosis not present

## 2017-11-22 DIAGNOSIS — I130 Hypertensive heart and chronic kidney disease with heart failure and stage 1 through stage 4 chronic kidney disease, or unspecified chronic kidney disease: Secondary | ICD-10-CM | POA: Diagnosis not present

## 2017-11-22 DIAGNOSIS — I69319 Unspecified symptoms and signs involving cognitive functions following cerebral infarction: Secondary | ICD-10-CM | POA: Diagnosis not present

## 2017-11-23 DIAGNOSIS — E1122 Type 2 diabetes mellitus with diabetic chronic kidney disease: Secondary | ICD-10-CM | POA: Diagnosis not present

## 2017-11-23 DIAGNOSIS — I69319 Unspecified symptoms and signs involving cognitive functions following cerebral infarction: Secondary | ICD-10-CM | POA: Diagnosis not present

## 2017-11-23 DIAGNOSIS — I2510 Atherosclerotic heart disease of native coronary artery without angina pectoris: Secondary | ICD-10-CM | POA: Diagnosis not present

## 2017-11-23 DIAGNOSIS — I130 Hypertensive heart and chronic kidney disease with heart failure and stage 1 through stage 4 chronic kidney disease, or unspecified chronic kidney disease: Secondary | ICD-10-CM | POA: Diagnosis not present

## 2017-11-23 DIAGNOSIS — I509 Heart failure, unspecified: Secondary | ICD-10-CM | POA: Diagnosis not present

## 2017-11-28 DIAGNOSIS — I130 Hypertensive heart and chronic kidney disease with heart failure and stage 1 through stage 4 chronic kidney disease, or unspecified chronic kidney disease: Secondary | ICD-10-CM | POA: Diagnosis not present

## 2017-11-28 DIAGNOSIS — I2510 Atherosclerotic heart disease of native coronary artery without angina pectoris: Secondary | ICD-10-CM | POA: Diagnosis not present

## 2017-11-28 DIAGNOSIS — I69319 Unspecified symptoms and signs involving cognitive functions following cerebral infarction: Secondary | ICD-10-CM | POA: Diagnosis not present

## 2017-11-28 DIAGNOSIS — E1122 Type 2 diabetes mellitus with diabetic chronic kidney disease: Secondary | ICD-10-CM | POA: Diagnosis not present

## 2017-11-28 DIAGNOSIS — I509 Heart failure, unspecified: Secondary | ICD-10-CM | POA: Diagnosis not present

## 2017-12-01 DIAGNOSIS — I2510 Atherosclerotic heart disease of native coronary artery without angina pectoris: Secondary | ICD-10-CM | POA: Diagnosis not present

## 2017-12-01 DIAGNOSIS — E1122 Type 2 diabetes mellitus with diabetic chronic kidney disease: Secondary | ICD-10-CM | POA: Diagnosis not present

## 2017-12-01 DIAGNOSIS — I130 Hypertensive heart and chronic kidney disease with heart failure and stage 1 through stage 4 chronic kidney disease, or unspecified chronic kidney disease: Secondary | ICD-10-CM | POA: Diagnosis not present

## 2017-12-01 DIAGNOSIS — I509 Heart failure, unspecified: Secondary | ICD-10-CM | POA: Diagnosis not present

## 2017-12-01 DIAGNOSIS — I69319 Unspecified symptoms and signs involving cognitive functions following cerebral infarction: Secondary | ICD-10-CM | POA: Diagnosis not present

## 2017-12-06 DIAGNOSIS — I130 Hypertensive heart and chronic kidney disease with heart failure and stage 1 through stage 4 chronic kidney disease, or unspecified chronic kidney disease: Secondary | ICD-10-CM | POA: Diagnosis not present

## 2017-12-06 DIAGNOSIS — I2510 Atherosclerotic heart disease of native coronary artery without angina pectoris: Secondary | ICD-10-CM | POA: Diagnosis not present

## 2017-12-06 DIAGNOSIS — E1122 Type 2 diabetes mellitus with diabetic chronic kidney disease: Secondary | ICD-10-CM | POA: Diagnosis not present

## 2017-12-06 DIAGNOSIS — I509 Heart failure, unspecified: Secondary | ICD-10-CM | POA: Diagnosis not present

## 2017-12-06 DIAGNOSIS — I69319 Unspecified symptoms and signs involving cognitive functions following cerebral infarction: Secondary | ICD-10-CM | POA: Diagnosis not present

## 2017-12-13 ENCOUNTER — Ambulatory Visit (INDEPENDENT_AMBULATORY_CARE_PROVIDER_SITE_OTHER): Payer: Medicare HMO | Admitting: *Deleted

## 2017-12-13 ENCOUNTER — Ambulatory Visit: Payer: Medicare HMO | Admitting: Nurse Practitioner

## 2017-12-13 ENCOUNTER — Encounter: Payer: Self-pay | Admitting: Nurse Practitioner

## 2017-12-13 VITALS — BP 136/90 | HR 68 | Ht 67.0 in | Wt 178.1 lb

## 2017-12-13 DIAGNOSIS — I480 Paroxysmal atrial fibrillation: Secondary | ICD-10-CM | POA: Diagnosis not present

## 2017-12-13 DIAGNOSIS — I495 Sick sinus syndrome: Secondary | ICD-10-CM | POA: Diagnosis not present

## 2017-12-13 DIAGNOSIS — Z950 Presence of cardiac pacemaker: Secondary | ICD-10-CM | POA: Diagnosis not present

## 2017-12-13 DIAGNOSIS — I2510 Atherosclerotic heart disease of native coronary artery without angina pectoris: Secondary | ICD-10-CM | POA: Diagnosis not present

## 2017-12-13 LAB — BASIC METABOLIC PANEL
BUN/Creatinine Ratio: 13 (ref 10–24)
BUN: 22 mg/dL (ref 8–27)
CO2: 25 mmol/L (ref 20–29)
Calcium: 9.7 mg/dL (ref 8.6–10.2)
Chloride: 99 mmol/L (ref 96–106)
Creatinine, Ser: 1.69 mg/dL — ABNORMAL HIGH (ref 0.76–1.27)
GFR calc Af Amer: 42 mL/min/{1.73_m2} — ABNORMAL LOW (ref >59)
GFR calc non Af Amer: 36 mL/min/{1.73_m2} — ABNORMAL LOW (ref >59)
Glucose: 146 mg/dL — ABNORMAL HIGH (ref 65–99)
Potassium: 4.6 mmol/L (ref 3.5–5.2)
Sodium: 137 mmol/L (ref 134–144)

## 2017-12-13 LAB — CBC
Hematocrit: 42.6 % (ref 37.5–51.0)
Hemoglobin: 14.2 g/dL (ref 13.0–17.7)
MCH: 31.8 pg (ref 26.6–33.0)
MCHC: 33.3 g/dL (ref 31.5–35.7)
MCV: 96 fL (ref 79–97)
Platelets: 152 10*3/uL (ref 150–379)
RBC: 4.46 x10E6/uL (ref 4.14–5.80)
RDW: 13.1 % (ref 12.3–15.4)
WBC: 6.8 10*3/uL (ref 3.4–10.8)

## 2017-12-13 LAB — HEPATIC FUNCTION PANEL
ALT: 12 IU/L (ref 0–44)
AST: 19 IU/L (ref 0–40)
Albumin: 3.8 g/dL (ref 3.5–4.7)
Alkaline Phosphatase: 75 IU/L (ref 39–117)
Bilirubin Total: 0.6 mg/dL (ref 0.0–1.2)
Bilirubin, Direct: 0.29 mg/dL (ref 0.00–0.40)
Total Protein: 6.5 g/dL (ref 6.0–8.5)

## 2017-12-13 LAB — TSH: TSH: 2.77 u[IU]/mL (ref 0.450–4.500)

## 2017-12-13 NOTE — Progress Notes (Signed)
CARDIOLOGY OFFICE NOTE  Date:  12/13/2017    Kent Nolan Date of Birth: 1929-01-17 Medical Record #[id number]  PCP:  Corwin Levins, MD  Cardiologist:  [name]    Chief Complaint  Patient presents with  . Coronary Artery Disease  . Congestive Heart Failure    1 year check - seen for [name]    History of Present Illness: Kent Nolan is a 84 y.o. male who presents today for a one year check with me. Seen for [name]. Former patient of [name].   He has known ischemic heart disease with prior subendocardial MI in February of 2005 with stents to the RCA and LCX. He experienced acute thrombosis of both stents in February of 2010 that was associated with cardiogenic shock and had repeat stenting to both lesions at that time with non drug eluting stents placed. Follow up cath in September of 2010 was satisfactory. He is on chronic Plavix.   Other problems include DM, HTN, GERD, ACE related cough, HLD, PAF and VT back in 2010 and on chronic amiodarone therapy, CRI and remote UTIs. He does have an underlying pacemaker in place since 2011 and has had prior cardioversion for his atrial fib back in August of 2011. While he does have an elevated CHADS score, he has been maintained on chronic Plavix and aspirin therapy in light of his stent thrombosis - he has declined anticoagulation with coumadin/NOVAC - has had no recurrent atrial fib. EF by echo from 2010 was 50 to 55%. Last generator change was in March of 2015.   I saw him back in February of 2015 - he was dizzy. Had "fallen out" several times since he was here - no significant injury yet. We did extensive testing at that time which included Myoview, echo, CT etc. Felt to have some degree of orthostasis. Has also had his generator replaced at the end of March 2015 per [name].   Seen by me in July of 2015. Still dizzy. Pretty limited from back pain - had been to see his PCP and was given Vicodin, muscle  relaxer and prednisone. Then saw [name] back in July as well. Device check ok. He was quite orthostatic at that visit here - almost passed out while weighing. Was using narcotics and felt to be oversedated as well. Lasix was stopped. Did not wish to change to coumadin/NOAC but preferred to stay on his Plavix and aspirin therapy.  I have seen him several times since - mostly limited by his backpain. He has had his atrial lead revised on his pacemaker. Dose of amiodarone has been able to be cut back. Had a prior syncopal spell in 2017 - he did not seek attention.   Last visit with me was a year ago - more limited by his back. Saw [name] 6 months ago.   He had a fall back in February - noted to have left sided weakness - diagnosed with embolic stroke. Now off Plavix and on low dose Eliquis/aspirin. Remains on amiodarone. He underwent endovascular thrombectomy. Cerebral infarction due to right M1 occlusion due to AF - post CT of the head with possible small amount of postprocedure ICH. Procedure was complicated by right groin wound.   Comes in today. Here with his wife. He has really declined since I last saw him. She notes he is more sedentary. He feels dizzy today. Still getting some PT. Using his walker. Balance is poor.  Mostly complains of his back pain - this is chronic. No real chest pain. Breathing is stable. Weight is down a few pounds. No problems with the Eliquis. Seeing neuro later this month - she notes there is to be a discussion regarding possible Parkinson's. No falls since discharge. No bleeding. Hands and arms always with bluish tint. Wife feels like he is doing ok for the most part - they seem to be coping ok.   Past Medical History:  Diagnosis Date  . BRADYCARDIA 08/04/2010   s/p PPM since 2011 - generator change 12/03/13  . Chronic kidney disease    followed by nephrology  . Coronary artery disease    remote subendo MI in February 2005 with stents to RCA &LCX; s/p scute  thrombosis of both stents in February 2010 with cardiogenic shock - s/p BMS to both lesions; follow up cath in September 2010 was satisfactory.  He is medically managed with aspirin/Plavix  . GERD (gastroesophageal reflux disease)   . High risk medication use    on amiodarone for PAF/VT since February 2010  . HTN (hypertension)   . Hyperlipidemia   . Obesity   . Pacemaker   . PAF (paroxysmal atrial fibrillation) (HCC)    s/p cardioversion in August 2011  . Type II or unspecified type diabetes mellitus without mention of complication, uncontrolled     Past Surgical History:  Procedure Laterality Date  . EP IMPLANTABLE DEVICE N/A 02/25/2015   Procedure: Lead Revision/Repair;  Surgeon: [name], MD;  Location: [facility] INVASIVE CV LAB;  Service: Cardiovascular;  Laterality: N/A;  . IR CT HEAD LTD  10/23/2017  . IR PERCUTANEOUS ART THROMBECTOMY/INFUSION INTRACRANIAL INC DIAG ANGIO  10/23/2017  . PACEMAKER GENERATOR CHANGE  12/03/13   MDT Adapta L generator change for premature ERI by Dr Johney Frame  . PACEMAKER INSERTION  May 2011  . PERMANENT PACEMAKER GENERATOR CHANGE N/A 12/03/2013   Procedure: PERMANENT PACEMAKER GENERATOR CHANGE;  Surgeon: [name], MD;  Location: [facility] CATH LAB;  Service: Cardiovascular;  Laterality: N/A;  . RADIOLOGY WITH ANESTHESIA N/A 10/23/2017   Procedure: RADIOLOGY WITH ANESTHESIA;  Surgeon: [name], MD;  Location: [facility] OR;  Service: Radiology;  Laterality: N/A;     Medications: Current Meds  Medication Sig  . amiodarone (PACERONE) 200 MG tablet TAKE 1/2 TABLET (100mg ) BY MOUTH EVERY DAY  . apixaban (ELIQUIS) 2.5 MG TABS tablet Take 1 tablet (2.5 mg total) by mouth 2 (two) times daily.  Marland Kitchen aspirin 81 MG tablet Take 81 mg by mouth daily.    . Cholecalciferol (VITAMIN D-3 PO) Take 1 tablet by mouth daily.  . famotidine (PEPCID) 40 MG tablet Take 1 tablet (40 mg total) by mouth daily.  . fenofibrate (TRICOR) 145 MG tablet TAKE 1 TABLET BY MOUTH EVERY DAY  .  Multiple Vitamin (MULTIVITAMIN) tablet Take 1 tablet by mouth daily.    . nitroGLYCERIN (NITROSTAT) 0.4 MG SL tablet Place 1 tablet (0.4 mg total) under the tongue every 5 (five) minutes as needed. For chest pain  . nystatin cream (MYCOSTATIN) Apply to affected area 2 times daily (Patient taking differently: Apply 1 application topically 2 (two) times daily. )  . rosuvastatin (CRESTOR) 20 MG tablet TAKE 1 TABLET (20mg ) BY MOUTH EVERY DAY (Patient taking differently: Take 20 mg by mouth every evening. )     Allergies: Allergies  Allergen Reactions  . Penicillins Other (See Comments)    Reaction unknown  . Sulfonamide Derivatives Other (See Comments)  Reaction unknown    Social History: The patient  reports that he has quit smoking. His smoking use included cigarettes. He smoked 3.00 packs per day. He has never used smokeless tobacco. He reports that he does not drink alcohol or use drugs.   Family History: The patient's family history includes Diabetes in his father; Heart disease in his mother; Stomach cancer in his father.   Review of Systems: Please see the history of present illness.   Otherwise, the review of systems is positive for none.   All other systems are reviewed and negative.   Physical Exam: VS:  BP 136/90 (BP Location: Left Arm, Patient Position: Sitting, Cuff Size: Normal)   Pulse 68   Ht 5\' 7"  (1.702 m)   Wt 178 lb 1.9 oz (80.8 kg)   SpO2 99% Comment: at rest  BMI 27.90 kg/m  .  BMI Body mass index is 27.9 kg/m.  Wt Readings from Last 3 Encounters:  12/13/17 178 lb 1.9 oz (80.8 kg)  11/08/17 179 lb (81.2 kg)  10/23/17 184 lb 1.4 oz (83.5 kg)    General: Elderly male. Alert and in no acute distress. He is kyphotic. Seems more "thick tongued".  Some dysarthria noted.  HEENT: Normal. He is quite hard of hearing.  Neck: Supple, no JVD, carotid bruits, or masses noted.  Cardiac: Regular rate and rhythm.  No edema.  Respiratory:  Lungs are clear to  auscultation bilaterally with normal work of breathing.  GI: Soft and nontender.  MS: No deformity or atrophy. Gait and ROM intact but he is stooped over - seems to shuffle his feet.  Skin: Warm and dry. Color is normal. His hands/arms are chronically with a blue tint - both hands are cool today but he has excellent radial pulses.  Neuro:  Strength and sensation are intact and no gross focal deficits noted.  Psych: Alert, appropriate and with normal affect.   LABORATORY DATA:  EKG:  EKG is not ordered today.  Lab Results  Component Value Date   WBC 5.5 11/13/2017   HGB 12.8 (L) 11/13/2017   HCT 40.2 11/13/2017   PLT 143 (L) 11/13/2017   GLUCOSE 113 (H) 11/13/2017   CHOL 77 10/24/2017   TRIG 74 10/24/2017   HDL 30 (L) 10/24/2017   LDLDIRECT 45.4 07/23/2012   LDLCALC 32 10/24/2017   ALT 18 11/10/2017   AST 29 11/10/2017   NA 139 11/13/2017   K 4.1 11/13/2017   CL 108 11/13/2017   CREATININE 1.84 (H) 11/13/2017   BUN 30 (H) 11/13/2017   CO2 22 11/13/2017   TSH 3.570 06/12/2017   INR 1.04 10/23/2017   HGBA1C 7.8 (H) 10/24/2017   MICROALBUR 1.7 07/22/2015     BNP (last 3 results) No results for input(s): BNP in the last 8760 hours.  ProBNP (last 3 results) No results for input(s): PROBNP in the last 8760 hours.   Other Studies Reviewed Today:  Echo Study Conclusions 10/2017  - Left ventricle: The cavity size was normal. Wall thickness was   normal. Systolic function was mildly to moderately reduced. The   estimated ejection fraction was in the range of 40% to 45%.   Diffuse hypokinesis. There is akinesis of the inferior and   inferoseptal myocardium. Doppler parameters are consistent with   abnormal left ventricular relaxation (grade 1 diastolic   dysfunction). - Mitral valve: There was mild regurgitation. - Tricuspid valve: There was mild regurgitation.  Impressions:  - No cardiac source of emboli  was indentified.   Myoview Overall Impression from  10/2013:  Intermediate risk stress nuclear study with  large (35% of myocardium) mostly fixed inferolateral defect  suggesting of LCx territory scar. No significant reversible  ischemia.  LV Wall Motion: LVEF 47%, with inferolateral akinesis  Chrystie Nose, MD, [facility] Board Certified in Nuclear Cardiology Attending Cardiologist [facility]   Assessment/Plan:  1. Recent stroke - speech affected. Now on Eliquis.   2. Generalized decline   3. Underlying SSS - pacemaker in place - prior atrial lead revision from 2016. Followed by [name] - sees him in October.   4. PAF - he and his family had not wished to be on anticoagulation. He remains pretty unsteady. Now with recent stroke. Now on Eliquis.   5. CAD - No ischemic symptoms reported - I have left him on his current regimen. He has a taxus stent in place. I have left him on baby aspirin for now.   6. HLD - on statin - lipids from February noted.   7. High risk therapy - obtain the rest of his surveillance labs today.   8. LV dysfunction - recent echo noted - would favor conservative management. He has no active symptoms of heart failure.   Current medicines are reviewed with the patient today.  The patient does not have concerns regarding medicines other than what has been noted above.  The following changes have been made:  See above.  Labs/ tests ordered today include:    Orders Placed This Encounter  Procedures  . Basic metabolic panel  . CBC  . Hepatic function panel  . TSH     Disposition:   FU with me in 4 months.    Patient is agreeable to this plan and will call if any problems develop in the interim.   SignedNorma Fredrickson, NP  12/13/2017 10:53 AM  [facility] [address] [place], [place]  [address] Phone: [phone] Fax: [phone]

## 2017-12-13 NOTE — Patient Instructions (Addendum)
We will be checking the following labs today - BMET, CBC, HPF and Tsh   Medication Instructions:    Continue with your current medicines.     Testing/Procedures To Be Arranged:  N/A  Follow-Up:   See me in about 4 months    Other Special Instructions:   N/A    If you need a refill on your cardiac medications before your next appointment, please call your pharmacy.   Call the [facility] office at [phone] if you have any questions, problems or concerns.

## 2017-12-14 ENCOUNTER — Encounter: Payer: Self-pay | Admitting: Cardiology

## 2017-12-14 NOTE — Progress Notes (Signed)
Remote pacemaker transmission.   

## 2017-12-20 ENCOUNTER — Ambulatory Visit: Payer: Medicare HMO | Admitting: Nurse Practitioner

## 2017-12-25 ENCOUNTER — Telehealth: Payer: Self-pay | Admitting: Nurse Practitioner

## 2017-12-25 NOTE — Telephone Encounter (Signed)
Pt's wife calling with questions about Eliquis dicussed at last visit with Lori-pls advise [phone]

## 2017-12-25 NOTE — Telephone Encounter (Signed)
Spoke with patient regarding Eliquis.. They needed samples, which I was able to receive from refill dept. Patient will arrive shortly to pick up.

## 2017-12-28 ENCOUNTER — Encounter: Payer: Self-pay | Admitting: Neurology

## 2017-12-28 ENCOUNTER — Other Ambulatory Visit: Payer: Self-pay | Admitting: Internal Medicine

## 2017-12-28 ENCOUNTER — Ambulatory Visit: Payer: Medicare HMO | Admitting: Neurology

## 2017-12-28 VITALS — BP 144/83 | HR 69 | Ht 67.0 in | Wt 177.4 lb

## 2017-12-28 DIAGNOSIS — I63411 Cerebral infarction due to embolism of right middle cerebral artery: Secondary | ICD-10-CM | POA: Diagnosis not present

## 2017-12-28 MED ORDER — APIXABAN 2.5 MG PO TABS: 2.5000 mg | ORAL_TABLET | Freq: Two times a day (BID) | ORAL | 1 refills | Status: DC

## 2017-12-28 NOTE — Telephone Encounter (Signed)
Refill sent in

## 2017-12-28 NOTE — Progress Notes (Signed)
[facility] [address] [place]. [place] [address] [phone]       OFFICE FOLLOW-UP NOTE  Kent. Kent Nolan Date of Birth:  04/22/1929 Medical Record Number:  [id number]   HPI: Kent Nolan is a 84 year old Caucasian male seen today for first office follow-up visit following hospital admission for stroke and family 2019. He is accompanied by his wife. History is obtained from them and review of electronic medical records. I personally reviewed imaging  films.Kent Nolan a 84 y.o.malepast medical history of paroxysmal atrial fibrillation not on anticoagulation currently on dual antiplatelets, bradycardia status post pacemaker 2011, chronic kidney disease, hypertension, coronary artery disease, hyperlipidemia, diabetes, prior best report by EMS last seen normal at 3:10 PM today 10/23/2017, was noted by the wife to have witnessed a fall as he was trying to get out of the chair and go to use the bathroom. EMS was called. Upon EMS initial assessment, there was some left-sided weakness that started to improve. They made him walk which presumably worsened his symptoms. En route, he started becoming better with only some dysarthria and left facial droop, which I also found on my exam. No preceding sickness or illness. No chest pain shortness of breath. No nausea vomiting. No visual symptoms. No headaches. No history of hitting his head on the floor during the fall. LKW:3:10 PM on 10/23/2017 tpa given?: no,low NIH.Premorbid modified Rankin scale (mRS):2 Initial NIH on arrival at the ER bridge-2-1 for dysarthria 1 for facial. Repeat NIH exam probably worse- 2 for dysarthria, may be 1 for the left arm, and 1 left face.His neurological exam worsened with worsening dysarthria and more left upper extremity weakness, endovascular team contacted for endovascular thrombectomy.CT angiogram of the head showed distal right M1 occlusion with moderate atherosclerotic changes  involving left P3 segment. C T angio of  neck showed no significant stenosis in the neck. He was taken for emergent mechanical thrombectomy of the occluded right M1 middle cerebral artery and the key 3 revascularization was obtained. Postprocedure CT scan showed possible small subarachnoid hemorrhage. He was admitted to the intensive care unit. He had close neurological monitoring with tight blood pressure control. He made steady improvement.hemoglobin A1c was 7.8.LDL cholesterol was 45 mg percent.follow-up CT scan showed punctate densities in the right insular cortex and subcortical region with a very small infarct. Patient was started on eliquis 2.5 mg twice daily as well as from either on, fenofibrate and Crestor. He was seen by a therapist and recommended to get home therapy. Patient states his done well since discharge. His able to ambulate independently. He still tracks his left leg a little bit but otherwise has been good physical improvement. He walks with a cane and sometimes a walker. His stumbles a lot but has had no major falls or injuries. He is tolerating eliquis well without bleeding or bruising. His blood pressure is well controlled and today it is 144/83. He does have some intermittent mild left hand tremor as well as occasional drooling but denies any significant functional limitation from this. He has finished home physical and occupational therapy.      ROS:   14 system review of systems is positive for hearing loss, ringing in the ears, frequency of urination, walking difficulty, neck pain, easy bruising and bleeding, speech difficulties, tremors, facial drooping and all other systems negative  PMH:  Past Medical History:  Diagnosis Date  . BRADYCARDIA 08/04/2010   s/p PPM since 2011 - generator change 12/03/13  . Chronic  kidney disease    followed by nephrology  . Coronary artery disease    remote subendo MI in February 2005 with stents to RCA &LCX; s/p scute thrombosis of  both stents in February 2010 with cardiogenic shock - s/p BMS to both lesions; follow up cath in September 2010 was satisfactory.  He is medically managed with aspirin/Plavix  . GERD (gastroesophageal reflux disease)   . High risk medication use    on amiodarone for PAF/VT since February 2010  . HTN (hypertension)   . Hyperlipidemia   . Obesity   . Pacemaker   . PAF (paroxysmal atrial fibrillation) (HCC)    s/p cardioversion in August 2011  . Stroke (HCC)   . Type II or unspecified type diabetes mellitus without mention of complication, uncontrolled     Social History:  Social History   Socioeconomic History  . Marital status: Married    Spouse name: Not on file  . Number of children: Not on file  . Years of education: Not on file  . Highest education level: Not on file  Occupational History  . Not on file  Social Needs  . Financial resource strain: Not on file  . Food insecurity:    Worry: Not on file    Inability: Not on file  . Transportation needs:    Medical: Not on file    Non-medical: Not on file  Tobacco Use  . Smoking status: Former Smoker    Packs/day: 3.00    Types: Cigarettes  . Smokeless tobacco: Never Used  . Tobacco comment: back in the [place]  Substance and Sexual Activity  . Alcohol use: No  . Drug use: No  . Sexual activity: Never  Lifestyle  . Physical activity:    Days per week: Not on file    Minutes per session: Not on file  . Stress: Not on file  Relationships  . Social connections:    Talks on phone: Not on file    Gets together: Not on file    Attends religious service: Not on file    Active member of club or organization: Not on file    Attends meetings of clubs or organizations: Not on file    Relationship status: Not on file  . Intimate partner violence:    Fear of current or ex partner: Not on file    Emotionally abused: Not on file    Physically abused: Not on file    Forced sexual activity: Not on file  Other Topics Concern    . Not on file  Social History Narrative  . Not on file    Medications:   Current Outpatient Medications on File Prior to Visit  Medication Sig Dispense Refill  . amiodarone (PACERONE) 200 MG tablet TAKE 1/2 TABLET (100mg ) BY MOUTH EVERY DAY 15 tablet 9  . aspirin 81 MG tablet Take 81 mg by mouth daily.      . Cholecalciferol (VITAMIN D-3 PO) Take 1 tablet by mouth daily.    . famotidine (PEPCID) 40 MG tablet Take 1 tablet (40 mg total) by mouth daily. 30 tablet 11  . fenofibrate (TRICOR) 145 MG tablet TAKE 1 TABLET BY MOUTH EVERY DAY 90 tablet 1  . Multiple Vitamin (MULTIVITAMIN) tablet Take 1 tablet by mouth daily.      . nitroGLYCERIN (NITROSTAT) 0.4 MG SL tablet Place 1 tablet (0.4 mg total) under the tongue every 5 (five) minutes as needed. For chest pain 25 tablet 5  . rosuvastatin (  CRESTOR) 20 MG tablet TAKE 1 TABLET (20mg ) BY MOUTH EVERY DAY (Patient taking differently: Take 20 mg by mouth every evening. ) 90 tablet 3   No current facility-administered medications on file prior to visit.     Allergies:   Allergies  Allergen Reactions  . Penicillins Other (See Comments)    Reaction unknown  . Sulfonamide Derivatives Other (See Comments)    Reaction unknown    Physical Exam General:obese elderly Caucasian male seated, in no evident distress Head: head normocephalic and atraumatic.  Neck: supple with no carotid or supraclavicular bruits Cardiovascular: regular rate and rhythm, no murmurs Musculoskeletal: no deformity Skin:  no rash/petichiae Vascular:  Normal pulses all extremities Vitals:   12/28/17 1010  BP: (!) 144/83  Pulse: 69   Neurologic Exam Mental Status: Awake and fully alert. Oriented to place and time. Recent and remote memory intact. Attention span, concentration and fund of knowledge appropriate. Mood and affect appropriate. Mild slurred speech.decreased facial expression. Positive glabellar tap. Cranial Nerves: Fundoscopic exam reveals sharp disc  margins. Pupils equal, briskly reactive to light. Extraocular movements full without nystagmus. Visual fields full to confrontation. Hearing is diminished bilaterally.Facial sensation intact. Face, tongue, palate moves normally and symmetrically.  Motor: Normal bulk and tone. Normal strength in all tested extremity muscles.mild increased tone in the left leg. Diminished left foot tapping. Resting pill-rolling tremor in the left hand.Slight cogwheel rigidity at the left wrist. Sensory.: intact to touch ,pinprick .position and vibratory sensation.  Coordination: Rapid alternating movements normal in all extremities. Finger-to-nose and heel-to-shin performed accurately bilaterally. Gait and Station: Arises from chair with some difficulty. Stance is normal.Poor postural response to threat. Gait demonstrates normal stride length and diminished left arm swelling and slight dragging of the left leg. . Able to heel, toe and tandem walk without difficulty.  Reflexes: 1+ and symmetric. Toes downgoing.   NIHSS 1 Modified Rankin 2  ASSESSMENT: 84 year old male with right middle cerebral artery infarct and pelvic 2019 secondary to right M1 occlusion treated with mechanical thrombectomy with excellent recannulization. Vascular risk factors of atrial fibrillation, hyperlipidemia and age.He also has mild left hemi-Parkinson's but symptoms are not disabling enough for a trial of medication at the present time    PLAN: I had a long d/w patient and his wife about his recent embolic stroke,atrial fibrillation, risk for recurrent stroke/TIAs, personally independently reviewed imaging studies and stroke evaluation results and answered questions.Continue Eliquis (apixaban) daily  for secondary stroke prevention and maintain strict control of hypertension with blood pressure goal below 130/90, diabetes with hemoglobin A1c goal below 6.5% and lipids with LDL cholesterol goal below 70 mg/dL. I also advised the patient to eat  a healthy diet with plenty of whole grains, cereals, fruits and vegetables, exercise regularly and maintain ideal body weight.I advised him to use a cane or walker at all times for safety precautions and we also discussed fall and safety prevention. Followup in the future with my nurse practitioner Shanda Bumps in 3 months or call earlier if necessary Greater than 50% of time during this 25 minute visit was spent on counseling,explanation of diagnosis, planning of further management, discussion with patient and family and coordination of care Delia Heady, MD  [facility] [address] [place], [place] [address]  Phone [phone] Fax [phone] Note: This document was prepared with digital dictation and possible smart phrase technology. Any transcriptional errors that result from this process are unintentional

## 2017-12-28 NOTE — Patient Instructions (Signed)
I had a long d/w patient and his wife about his recent embolic stroke,atrial fibrillation, risk for recurrent stroke/TIAs, personally independently reviewed imaging studies and stroke evaluation results and answered questions.Continue Eliquis (apixaban) daily  for secondary stroke prevention and maintain strict control of hypertension with blood pressure goal below 130/90, diabetes with hemoglobin A1c goal below 6.5% and lipids with LDL cholesterol goal below 70 mg/dL. I also advised the patient to eat a healthy diet with plenty of whole grains, cereals, fruits and vegetables, exercise regularly and maintain ideal body weight.I advised him to use a cane or walker at all times for safety precautions and we also discussed fall and safety prevention. Followup in the future with my nurse practitioner Shanda Bumps in 3 months or call earlier if necessary  Fall Prevention in the Home Falls can cause injuries. They can happen to people of all ages. There are many things you can do to make your home safe and to help prevent falls. What can I do on the outside of my home?  Regularly fix the edges of walkways and driveways and fix any cracks.  Remove anything that might make you trip as you walk through a door, such as a raised step or threshold.  Trim any bushes or trees on the path to your home.  Use bright outdoor lighting.  Clear any walking paths of anything that might make someone trip, such as rocks or tools.  Regularly check to see if handrails are loose or broken. Make sure that both sides of any steps have handrails.  Any raised decks and porches should have guardrails on the edges.  Have any leaves, snow, or ice cleared regularly.  Use sand or salt on walking paths during winter.  Clean up any spills in your garage right away. This includes oil or grease spills. What can I do in the bathroom?  Use night lights.  Install grab bars by the toilet and in the tub and shower. Do not use towel bars  as grab bars.  Use non-skid mats or decals in the tub or shower.  If you need to sit down in the shower, use a plastic, non-slip stool.  Keep the floor dry. Clean up any water that spills on the floor as soon as it happens.  Remove soap buildup in the tub or shower regularly.  Attach bath mats securely with double-sided non-slip rug tape.  Do not have throw rugs and other things on the floor that can make you trip. What can I do in the bedroom?  Use night lights.  Make sure that you have a light by your bed that is easy to reach.  Do not use any sheets or blankets that are too big for your bed. They should not hang down onto the floor.  Have a firm chair that has side arms. You can use this for support while you get dressed.  Do not have throw rugs and other things on the floor that can make you trip. What can I do in the kitchen?  Clean up any spills right away.  Avoid walking on wet floors.  Keep items that you use a lot in easy-to-reach places.  If you need to reach something above you, use a strong step stool that has a grab bar.  Keep electrical cords out of the way.  Do not use floor polish or wax that makes floors slippery. If you must use wax, use non-skid floor wax.  Do not have throw rugs and  other things on the floor that can make you trip. What can I do with my stairs?  Do not leave any items on the stairs.  Make sure that there are handrails on both sides of the stairs and use them. Fix handrails that are broken or loose. Make sure that handrails are as long as the stairways.  Check any carpeting to make sure that it is firmly attached to the stairs. Fix any carpet that is loose or worn.  Avoid having throw rugs at the top or bottom of the stairs. If you do have throw rugs, attach them to the floor with carpet tape.  Make sure that you have a light switch at the top of the stairs and the bottom of the stairs. If you do not have them, ask someone to add  them for you. What else can I do to help prevent falls?  Wear shoes that: ? Do not have high heels. ? Have rubber bottoms. ? Are comfortable and fit you well. ? Are closed at the toe. Do not wear sandals.  If you use a stepladder: ? Make sure that it is fully opened. Do not climb a closed stepladder. ? Make sure that both sides of the stepladder are locked into place. ? Ask someone to hold it for you, if possible.  Clearly mark and make sure that you can see: ? Any grab bars or handrails. ? First and last steps. ? Where the edge of each step is.  Use tools that help you move around (mobility aids) if they are needed. These include: ? Canes. ? Walkers. ? Scooters. ? Crutches.  Turn on the lights when you go into a dark area. Replace any light bulbs as soon as they burn out.  Set up your furniture so you have a clear path. Avoid moving your furniture around.  If any of your floors are uneven, fix them.  If there are any pets around you, be aware of where they are.  Review your medicines with your doctor. Some medicines can make you feel dizzy. This can increase your chance of falling. Ask your doctor what other things that you can do to help prevent falls. This information is not intended to replace advice given to you by your health care provider. Make sure you discuss any questions you have with your health care provider. Document Released: 06/18/2009 Document Revised: 01/28/2016 Document Reviewed: 09/26/2014 Elsevier Interactive Patient Education  [employer]2018 [employer]

## 2017-12-28 NOTE — Telephone Encounter (Signed)
°*  STAT* If patient is at the pharmacy, call can be transferred to refill team.   1. Which medications need to be refilled? (please list name of each medication and dose if known)new prescription for Eliquis   2. Which pharmacy/location (including street and city if local pharmacy) is medication to be sent to?[facility]  3. Do they need a 30 day or 90 day supply?180 and refills

## 2018-01-19 LAB — CUP PACEART REMOTE DEVICE CHECK
Date Time Interrogation Session: 20190517140500
Implantable Lead Implant Date: 20160622
Implantable Lead Location: 753859
Implantable Lead Location: 753860
Implantable Lead Model: 5076
Implantable Pulse Generator Implant Date: 20160622
Lead Channel Setting Pacing Amplitude: 2 V
Lead Channel Setting Pacing Pulse Width: 0.4 ms
MDC IDC LEAD IMPLANT DT: 20110531
MDC IDC LEAD SERIAL: 677341
MDC IDC SET LEADCHNL RV PACING AMPLITUDE: 2.5 V
MDC IDC SET LEADCHNL RV SENSING SENSITIVITY: 2 mV

## 2018-01-22 ENCOUNTER — Other Ambulatory Visit: Payer: Self-pay

## 2018-01-22 NOTE — Patient Outreach (Signed)
Telephone outreach to patient to obtain mRS was successfully completed. mRS = 0 

## 2018-02-08 ENCOUNTER — Ambulatory Visit (INDEPENDENT_AMBULATORY_CARE_PROVIDER_SITE_OTHER): Payer: Medicare HMO | Admitting: Internal Medicine

## 2018-02-08 ENCOUNTER — Encounter: Payer: Self-pay | Admitting: Internal Medicine

## 2018-02-08 VITALS — BP 114/70 | HR 87 | Ht 67.0 in | Wt 178.0 lb

## 2018-02-08 DIAGNOSIS — Z0000 Encounter for general adult medical examination without abnormal findings: Secondary | ICD-10-CM

## 2018-02-08 DIAGNOSIS — E119 Type 2 diabetes mellitus without complications: Secondary | ICD-10-CM | POA: Diagnosis not present

## 2018-02-08 DIAGNOSIS — Z23 Encounter for immunization: Secondary | ICD-10-CM | POA: Diagnosis not present

## 2018-02-08 LAB — POCT GLYCOSYLATED HEMOGLOBIN (HGB A1C): Hemoglobin A1C: 7.6 % — AB (ref 4.0–5.6)

## 2018-02-08 NOTE — Progress Notes (Signed)
Subjective:    Patient ID: Kent Nolan, male    DOB: July 02, 1929, 84 y.o.   MRN: [id number]  HPI  Here for wellness and f/u;  Overall doing ok;  Pt denies Chest pain, worsening SOB, DOE, wheezing, orthopnea, PND, worsening LE edema, palpitations, dizziness or syncope.  Pt denies neurological change such as new headache, facial or extremity weakness.  Pt denies polydipsia, polyuria, or low sugar symptoms. Pt states overall good compliance with treatment and medications, good tolerability, and has been trying to follow appropriate diet.  Pt denies worsening depressive symptoms, suicidal ideation or panic. No fever, night sweats, wt loss, loss of appetite, or other constitutional symptoms.  Pt states good ability with ADL's, has low fall risk, home safety reviewed and adequate, no other significant changes in hearing or vision, and only occasionally active with exercise.  Pt continues to have recurring LBP without change in severity, bowel or bladder change, fever, wt loss,  worsening LE pain/numbness/weakness, gait change or falls.   Past Medical History:  Diagnosis Date  . BRADYCARDIA 08/04/2010   s/p PPM since 2011 - generator change 12/03/13  . Chronic kidney disease    followed by nephrology  . Coronary artery disease    remote subendo MI in February 2005 with stents to RCA &LCX; s/p scute thrombosis of both stents in February 2010 with cardiogenic shock - s/p BMS to both lesions; follow up cath in September 2010 was satisfactory.  He is medically managed with aspirin/Plavix  . GERD (gastroesophageal reflux disease)   . High risk medication use    on amiodarone for PAF/VT since February 2010  . HTN (hypertension)   . Hyperlipidemia   . Obesity   . Pacemaker   . PAF (paroxysmal atrial fibrillation) (HCC)    s/p cardioversion in August 2011  . Stroke (HCC)   . Type II or unspecified type diabetes mellitus without mention of complication, uncontrolled    Past Surgical History:    Procedure Laterality Date  . EP IMPLANTABLE DEVICE N/A 02/25/2015   Procedure: Lead Revision/Repair;  Surgeon: Hillis RangeJames Allred, MD;  Location: [facility]VASIVE [facility];  Service: Cardiovascular;  Laterality: N/A;  . IR CT HEAD LTD  10/23/2017  . IR PERCUTANEOUS ART THROMBECTOMY/INFUSION INTRACRANIAL INC DIAG ANGIO  10/23/2017  . PACEMAKER GENERATOR CHANGE  12/03/13   MDT Adapta L generator change for premature ERI by [name]  . PACEMAKER INSERTION  May 2011  . PERMANENT PACEMAKER GENERATOR CHANGE N/A 12/03/2013   Procedure: PERMANENT PACEMAKER GENERATOR CHANGE;  Surgeon: Gardiner RhymeJames D Allred, MD;  Location: [facility] CATH LAB;  Service: Cardiovascular;  Laterality: N/A;  . RADIOLOGY WITH ANESTHESIA N/A 10/23/2017   Procedure: RADIOLOGY WITH ANESTHESIA;  Surgeon: Julieanne Cottoneveshwar, Sanjeev, MD;  Location: [facility] OR;  Service: Radiology;  Laterality: N/A;    reports that he has quit smoking. His smoking use included cigarettes. He smoked 3.00 packs per day. He has never used smokeless tobacco. He reports that he does not drink alcohol or use drugs. family history includes Diabetes in his father; Heart disease in his mother; Stomach cancer in his father. Allergies  Allergen Reactions  . Penicillins Other (See Comments)    Reaction unknown  . Sulfonamide Derivatives Other (See Comments)    Reaction unknown   Current Outpatient Medications on File Prior to Visit  Medication Sig Dispense Refill  . amiodarone (PACERONE) 200 MG tablet TAKE 1/2 TABLET (100mg ) BY MOUTH EVERY DAY 15 tablet 9  . apixaban (ELIQUIS) 2.5 MG TABS  tablet Take 1 tablet (2.5 mg total) by mouth 2 (two) times daily. 180 tablet 1  . aspirin 81 MG tablet Take 81 mg by mouth daily.      . Cholecalciferol (VITAMIN D-3 PO) Take 1 tablet by mouth daily.    . famotidine (PEPCID) 40 MG tablet Take 1 tablet (40 mg total) by mouth daily. 30 tablet 11  . fenofibrate (TRICOR) 145 MG tablet TAKE 1 TABLET BY MOUTH EVERY DAY 90 tablet 1  . Multiple Vitamin (MULTIVITAMIN)  tablet Take 1 tablet by mouth daily.      . nitroGLYCERIN (NITROSTAT) 0.4 MG SL tablet Place 1 tablet (0.4 mg total) under the tongue every 5 (five) minutes as needed. For chest pain 25 tablet 5  . rosuvastatin (CRESTOR) 20 MG tablet TAKE 1 TABLET (20mg ) BY MOUTH EVERY DAY (Patient taking differently: Take 20 mg by mouth every evening. ) 90 tablet 3   No current facility-administered medications on file prior to visit.    Review of Systems Constitutional: Negative for other unusual diaphoresis, sweats, appetite or weight changes HENT: Negative for other worsening hearing loss, ear pain, facial swelling, mouth sores or neck stiffness.   Eyes: Negative for other worsening pain, redness or other visual disturbance.  Respiratory: Negative for other stridor or swelling Cardiovascular: Negative for other palpitations or other chest pain  Gastrointestinal: Negative for worsening diarrhea or loose stools, blood in stool, distention or other pain Genitourinary: Negative for hematuria, flank pain or other change in urine volume.  Musculoskeletal: Negative for myalgias or other joint swelling.  Skin: Negative for other color change, or other wound or worsening drainage.  Neurological: Negative for other syncope or numbness. Hematological: Negative for other adenopathy or swelling Psychiatric/Behavioral: Negative for hallucinations, other worsening agitation, SI, self-injury, or new decreased concentration \\All  other system neg per pt    Objective:   Physical Exam BP 114/70   Pulse 87   Ht 5\' 7"  (1.702 m)   Wt 178 lb (80.7 kg)   SpO2 98%   BMI 27.88 kg/m  VS noted,  Constitutional: Pt is oriented to person, place, and time. Appears well-developed and well-nourished, in no significant distress and comfortable Head: Normocephalic and atraumatic  Eyes: Conjunctivae and EOM are normal. Pupils are equal, round, and reactive to light Right Ear: External ear normal without discharge Left Ear:  External ear normal without discharge Nose: Nose without discharge or deformity Mouth/Throat: Oropharynx is without other ulcerations and moist  Neck: Normal range of motion. Neck supple. No JVD present. No tracheal deviation present or significant neck LA or mass Cardiovascular: Normal rate, regular rhythm, normal heart sounds and intact distal pulses.   Pulmonary/Chest: WOB normal and breath sounds without rales or wheezing  Abdominal: Soft. Bowel sounds are normal. NT. No HSM  Musculoskeletal: Normal range of motion. Exhibits no edema Lymphadenopathy: Has no other cervical adenopathy.  Neurological: Pt is alert and oriented to person, place, and time. Pt has normal reflexes. No cranial nerve deficit. Motor grossly intact, Gait intact Skin: Skin is warm and dry. No rash noted or new ulcerations Psychiatric:  Has normal mood and affect. Behavior is normal without agitation No other exam findings  Lab Results  Component Value Date   WBC 6.8 12/13/2017   HGB 14.2 12/13/2017   HCT 42.6 12/13/2017   PLT 152 12/13/2017   GLUCOSE 146 (H) 12/13/2017   CHOL 77 10/24/2017   TRIG 74 10/24/2017   HDL 30 (L) 10/24/2017   LDLDIRECT 45.4 07/23/2012  LDLCALC 32 10/24/2017   ALT 12 12/13/2017   AST 19 12/13/2017   NA 137 12/13/2017   K 4.6 12/13/2017   CL 99 12/13/2017   CREATININE 1.69 (H) 12/13/2017   BUN 22 12/13/2017   CO2 25 12/13/2017   TSH 2.770 12/13/2017   INR 1.04 10/23/2017   HGBA1C 7.8 (H) 10/24/2017   MICROALBUR 1.7 07/22/2015   POCT glycosylated hemoglobin (Hb A1C)  - today  Ref Range & Units 11:03 23mo ago 23yr ago 24yr ago  Hemoglobin A1C 4.0 - 5.6 % 7.6Abnormal   7.8High  R, CM 6.3 R, CM 6.9High  R            Assessment & Plan:

## 2018-02-08 NOTE — Patient Instructions (Signed)
You had the Pneumovax pneumonia shot today  Your A1c was OK today  Please continue all other medications as before, and refills have been done if requested.  Please have the pharmacy call with any other refills you may need.  Please continue your efforts at being more active, low cholesterol diet, and weight control.  You are otherwise up to date with prevention measures today.  Please keep your appointments with your specialists as you may have planned  Please return in 6 months, or sooner if needed

## 2018-02-11 ENCOUNTER — Encounter: Payer: Self-pay | Admitting: Internal Medicine

## 2018-02-11 NOTE — Assessment & Plan Note (Signed)
stable overall by history and exam, recent data reviewed with pt, and pt to continue medical treatment as before,  to f/u any worsening symptoms or concerns, for lab f/u 

## 2018-02-11 NOTE — Assessment & Plan Note (Signed)

## 2018-02-14 DIAGNOSIS — I509 Heart failure, unspecified: Secondary | ICD-10-CM | POA: Diagnosis not present

## 2018-02-14 DIAGNOSIS — G8929 Other chronic pain: Secondary | ICD-10-CM | POA: Diagnosis not present

## 2018-02-14 DIAGNOSIS — I951 Orthostatic hypotension: Secondary | ICD-10-CM | POA: Diagnosis not present

## 2018-02-14 DIAGNOSIS — I4891 Unspecified atrial fibrillation: Secondary | ICD-10-CM | POA: Diagnosis not present

## 2018-02-14 DIAGNOSIS — E785 Hyperlipidemia, unspecified: Secondary | ICD-10-CM | POA: Diagnosis not present

## 2018-02-14 DIAGNOSIS — R69 Illness, unspecified: Secondary | ICD-10-CM | POA: Diagnosis not present

## 2018-02-14 DIAGNOSIS — I25119 Atherosclerotic heart disease of native coronary artery with unspecified angina pectoris: Secondary | ICD-10-CM | POA: Diagnosis not present

## 2018-02-14 DIAGNOSIS — E1151 Type 2 diabetes mellitus with diabetic peripheral angiopathy without gangrene: Secondary | ICD-10-CM | POA: Diagnosis not present

## 2018-02-14 DIAGNOSIS — I252 Old myocardial infarction: Secondary | ICD-10-CM | POA: Diagnosis not present

## 2018-02-14 DIAGNOSIS — H04129 Dry eye syndrome of unspecified lacrimal gland: Secondary | ICD-10-CM | POA: Diagnosis not present

## 2018-03-14 ENCOUNTER — Ambulatory Visit (INDEPENDENT_AMBULATORY_CARE_PROVIDER_SITE_OTHER): Payer: Medicare HMO | Admitting: *Deleted

## 2018-03-14 DIAGNOSIS — I480 Paroxysmal atrial fibrillation: Secondary | ICD-10-CM

## 2018-03-14 DIAGNOSIS — I495 Sick sinus syndrome: Secondary | ICD-10-CM

## 2018-03-14 NOTE — Progress Notes (Signed)
Remote pacemaker transmission.   

## 2018-04-03 ENCOUNTER — Encounter: Payer: Self-pay | Admitting: Adult Health

## 2018-04-03 ENCOUNTER — Ambulatory Visit: Payer: Medicare HMO | Admitting: Adult Health

## 2018-04-03 VITALS — BP 133/79 | HR 70 | Ht 67.0 in | Wt 181.6 lb

## 2018-04-03 DIAGNOSIS — E119 Type 2 diabetes mellitus without complications: Secondary | ICD-10-CM

## 2018-04-03 DIAGNOSIS — I480 Paroxysmal atrial fibrillation: Secondary | ICD-10-CM | POA: Diagnosis not present

## 2018-04-03 DIAGNOSIS — I10 Essential (primary) hypertension: Secondary | ICD-10-CM

## 2018-04-03 DIAGNOSIS — E785 Hyperlipidemia, unspecified: Secondary | ICD-10-CM | POA: Diagnosis not present

## 2018-04-03 DIAGNOSIS — I63411 Cerebral infarction due to embolism of right middle cerebral artery: Secondary | ICD-10-CM | POA: Diagnosis not present

## 2018-04-03 NOTE — Progress Notes (Signed)
[facility] [address] [place]. [place] [address] [phone]       OFFICE FOLLOW-UP NOTE  Kent Nolan Date of Birth:  12/03/1928 Medical Record Number:  [id number]    Kent Nolan is a 84 year old Caucasian male seen today for follow-up visit for stroke 10/23/2017. He is accompanied by his wife. History is obtained from them and review of electronic medical records. I personally reviewed imaging  Films.  HPI: Kent Nolan a 84 y.o.malepast medical history of paroxysmal atrial fibrillation not on anticoagulation currently on dual antiplatelets, bradycardia status post pacemaker 2011, chronic kidney disease, hypertension, coronary artery disease, hyperlipidemia, diabetes, prior best report by EMS last seen normal at 3:10 PM today 10/23/2017, was noted by the wife to have witnessed a fall as he was trying to get out of the chair and go to use the bathroom. EMS was called. Upon EMS initial assessment, there was some left-sided weakness that started to improve. They made him walk which presumably worsened his symptoms. En route, he started becoming better with only some dysarthria and left facial droop, which I also found on my exam. No preceding sickness or illness. No chest pain shortness of breath. No nausea vomiting. No visual symptoms. No headaches. No history of hitting his head on the floor during the fall. LKW:3:10 PM on 10/23/2017 tpa given?: no,low NIH.Premorbid modified Rankin scale (mRS):2 Initial NIH on arrival at the ER bridge-2-1 for dysarthria 1 for facial. Repeat NIH exam probably worse- 2 for dysarthria, may be 1 for the left arm, and 1 left face.His neurological exam worsened with worsening dysarthria and more left upper extremity weakness, endovascular team contacted for endovascular thrombectomy.CT angiogram of the head showed distal right M1 occlusion with moderate atherosclerotic changes involving left P3 segment. C T angio of  neck showed  no significant stenosis in the neck. He was taken for emergent mechanical thrombectomy of the occluded right M1 middle cerebral artery and the key 3 revascularization was obtained. Postprocedure CT scan showed possible small subarachnoid hemorrhage. He was admitted to the intensive care unit. He had close neurological monitoring with tight blood pressure control. He made steady improvement.hemoglobin A1c was 7.8.LDL cholesterol was 45 mg percent.follow-up CT scan showed punctate densities in the right insular cortex and subcortical region with a very small infarct. Patient was started on eliquis 2.5 mg twice daily as well as from either on, fenofibrate and Crestor. He was seen by a therapist and recommended to get home therapy.   12/28/2017 visit PS: Patient states his done well since discharge. His able to ambulate independently. He still tracks his left leg a little bit but otherwise has been good physical improvement. He walks with a cane and sometimes a walker. His stumbles a lot but has had no major falls or injuries. He is tolerating eliquis well without bleeding or bruising. His blood pressure is well controlled and today it is 144/83. He does have some intermittent mild left hand tremor as well as occasional drooling but denies any significant functional limitation from this. He has finished home physical and occupational therapy.  Interval history: Patient is being seen today for routine follow-up visit and is accompanied by his wife.  Patient is doing well from a stroke standpoint.  He continues to live independently with his wife.  He does use a rolling walker when outside of house but will use cane while inside his house and denies recent falls.  Continues to take Eliquis and aspirin with mild  bruising but no bleeding for atrial fibrillation and CAD management which is monitored by cardiology.  Continues to take Crestor and fenofibrate without side effects myalgias.  Blood pressure today satisfactory  at 133/79.  Patient does try to remain active at home along with maintaining a healthy diet.  Patient does have continued complaints of left upper extremity tremors intermittently.  He denies these as being debilitating or bothersome.  Denies new or worsening stroke/TIA symptoms.   ROS:   14 system review of systems is positive for walking difficulty and hearing loss and all other systems negative  PMH:  Past Medical History:  Diagnosis Date  . BRADYCARDIA 08/04/2010   s/p PPM since 2011 - generator change 12/03/13  . Chronic kidney disease    followed by nephrology  . Coronary artery disease    remote subendo MI in February 2005 with stents to RCA &LCX; s/p scute thrombosis of both stents in February 2010 with cardiogenic shock - s/p BMS to both lesions; follow up cath in September 2010 was satisfactory.  He is medically managed with aspirin/Plavix  . GERD (gastroesophageal reflux disease)   . High risk medication use    on amiodarone for PAF/VT since February 2010  . HTN (hypertension)   . Hyperlipidemia   . Obesity   . Pacemaker   . PAF (paroxysmal atrial fibrillation) (HCC)    s/p cardioversion in August 2011  . Stroke (HCC)   . Type II or unspecified type diabetes mellitus without mention of complication, uncontrolled     Social History:  Social History   Socioeconomic History  . Marital status: Married    Spouse name: Not on file  . Number of children: Not on file  . Years of education: Not on file  . Highest education level: Not on file  Occupational History  . Not on file  Social Needs  . Financial resource strain: Not on file  . Food insecurity:    Worry: Not on file    Inability: Not on file  . Transportation needs:    Medical: Not on file    Non-medical: Not on file  Tobacco Use  . Smoking status: Former Smoker    Packs/day: 3.00    Types: Cigarettes  . Smokeless tobacco: Never Used  . Tobacco comment: back in the [place]  Substance and Sexual Activity    . Alcohol use: No  . Drug use: No  . Sexual activity: Never  Lifestyle  . Physical activity:    Days per week: Not on file    Minutes per session: Not on file  . Stress: Not on file  Relationships  . Social connections:    Talks on phone: Not on file    Gets together: Not on file    Attends religious service: Not on file    Active member of club or organization: Not on file    Attends meetings of clubs or organizations: Not on file    Relationship status: Not on file  . Intimate partner violence:    Fear of current or ex partner: Not on file    Emotionally abused: Not on file    Physically abused: Not on file    Forced sexual activity: Not on file  Other Topics Concern  . Not on file  Social History Narrative  . Not on file    Medications:   Current Outpatient Medications on File Prior to Visit  Medication Sig Dispense Refill  . amiodarone (PACERONE) 200 MG tablet  TAKE 1/2 TABLET (100mg ) BY MOUTH EVERY DAY 15 tablet 9  . apixaban (ELIQUIS) 2.5 MG TABS tablet Take 1 tablet (2.5 mg total) by mouth 2 (two) times daily. 180 tablet 1  . aspirin 81 MG tablet Take 81 mg by mouth daily.      . Cholecalciferol (VITAMIN D-3 PO) Take 1 tablet by mouth daily.    . famotidine (PEPCID) 40 MG tablet Take 1 tablet (40 mg total) by mouth daily. 30 tablet 11  . fenofibrate (TRICOR) 145 MG tablet TAKE 1 TABLET BY MOUTH EVERY DAY 90 tablet 1  . Multiple Vitamin (MULTIVITAMIN) tablet Take 1 tablet by mouth daily.      . nitroGLYCERIN (NITROSTAT) 0.4 MG SL tablet Place 1 tablet (0.4 mg total) under the tongue every 5 (five) minutes as needed. For chest pain 25 tablet 5  . rosuvastatin (CRESTOR) 20 MG tablet TAKE 1 TABLET (20mg ) BY MOUTH EVERY DAY (Patient taking differently: Take 20 mg by mouth every evening. ) 90 tablet 3   No current facility-administered medications on file prior to visit.     Allergies:   Allergies  Allergen Reactions  . Penicillins Other (See Comments)    Reaction  unknown  . Sulfonamide Derivatives Other (See Comments)    Reaction unknown    Physical Exam General: Pleasant elderly Caucasian male seated, in no evident distress Head: head normocephalic and atraumatic.  Neck: supple with no carotid or supraclavicular bruits Cardiovascular: regular rate and rhythm, no murmurs Musculoskeletal: no deformity Skin:  no rash/petichiae Vascular:  Normal pulses all extremities Vitals:   04/03/18 1120  BP: 133/79  Pulse: 70   Neurologic Exam Mental Status: Awake and fully alert. Oriented to place and time. Recent and remote memory intact. Attention span, concentration and fund of knowledge appropriate. Mood and affect appropriate. Mild slurred speech.decreased facial expression. Positive glabellar tap. Cranial Nerves: Fundoscopic exam reveals sharp disc margins. Pupils equal, briskly reactive to light. Extraocular movements full without nystagmus. Visual fields full to confrontation. Hearing is diminished bilaterally.Facial sensation intact. Face, tongue, palate moves normally and symmetrically.  Motor: Normal bulk and tone. Normal strength in all tested extremity muscles.mild increased tone in the left leg. Diminished left foot tapping. Resting pill-rolling tremor in the left hand.Slight cogwheel rigidity at the left wrist. Sensory.: intact to touch ,pinprick .position and vibratory sensation.  Coordination: Rapid alternating movements normal in all extremities. Finger-to-nose and heel-to-shin performed accurately bilaterally. Gait and Station: Arises from chair with some difficulty. Stance is normal.patient is using rolling walker to assist with ambulation and does take short cautious steps. Reflexes: 1+ and symmetric. Toes downgoing.    ASSESSMENT: 84 year old male with right middle cerebral artery infarct and pelvic 2019 secondary to right M1 occlusion treated with mechanical thrombectomy with excellent recannulization. Vascular risk factors of atrial  fibrillation, hyperlipidemia and age.He also has mild left hemi-Parkinson's but symptoms are not disabling enough for a trial of medication at the present time.  Patient returns today for follow-up visit and is accompanied by his wife and overall doing well.    PLAN: -Continue aspirin 81 mg daily and Eliquis (apixaban) daily  and Crestor and fenofibrate for secondary stroke prevention -F/u with PCP regarding your HLD and HTN management -f/u with cardiologist as scheduled for atrial fibrillation and Eliquis management -continue to monitor BP at home -Recommended to try wrist weight to help minimize left hand tremors -as these continue to not be debilitating or bothersome, no need to start medication management at this time -  Advised to continue to stay active and maintain a healthy diet -Maintain strict control of hypertension with blood pressure goal below 130/90, diabetes with hemoglobin A1c goal below 6.5% and cholesterol with LDL cholesterol (bad cholesterol) goal below 70 mg/dL. I also advised the patient to eat a healthy diet with plenty of whole grains, cereals, fruits and vegetables, exercise regularly and maintain ideal body weight.  Follow up in 6 months or call earlier if needed   Greater than 50% of time during this 25 minute visit was spent on counseling,explanation of diagnosis, planning of further management, discussion with patient and family and coordination of care  George Hugh, [facility]  [facility] [address] [place], [place] [address]  Phone [phone] Fax [phone] Note: This document was prepared with digital dictation and possible smart phrase technology. Any transcriptional errors that result from this process are unintentional.

## 2018-04-03 NOTE — Patient Instructions (Addendum)
Continue aspirin 81 mg daily and Eliquis (apixaban) daily  and crestor  for secondary stroke prevention  Continue to follow up with PCP regarding cholesterol and blood pressure management    Continue to follow with cardiologist regarding atrial fibrillation and eliquis management   Due to left hand mild tremors, you can try a 1-2 lb wrist weight to see if this helps.   Continue to stay active and eat healthy  Continue to monitor blood pressure at home  Maintain strict control of hypertension with blood pressure goal below 130/90, diabetes with hemoglobin A1c goal below 6.5% and cholesterol with LDL cholesterol (bad cholesterol) goal below 70 mg/dL. I also advised the patient to eat a healthy diet with plenty of whole grains, cereals, fruits and vegetables, exercise regularly and maintain ideal body weight.  Followup in the future with me in 6 months or call earlier if needed       Thank you for coming to [facility] at [facility]. I hope we have been able to provide you high quality care today.  You may receive a patient satisfaction survey over the next few weeks. We would appreciate your feedback and comments so that we may continue to improve ourselves and the health of our patients.

## 2018-04-05 ENCOUNTER — Ambulatory Visit: Payer: Medicare HMO | Admitting: Nurse Practitioner

## 2018-04-05 ENCOUNTER — Encounter (HOSPITAL_COMMUNITY): Payer: Self-pay | Admitting: Emergency Medicine

## 2018-04-05 ENCOUNTER — Ambulatory Visit: Payer: Self-pay | Admitting: *Deleted

## 2018-04-05 ENCOUNTER — Emergency Department (HOSPITAL_COMMUNITY)
Admission: EM | Admit: 2018-04-05 | Discharge: 2018-04-05 | Disposition: A | Discharge status: Home / Self Care | Payer: Medicare HMO | Attending: Emergency Medicine | Admitting: Emergency Medicine

## 2018-04-05 DIAGNOSIS — R040 Epistaxis: Secondary | ICD-10-CM

## 2018-04-05 DIAGNOSIS — Z7901 Long term (current) use of anticoagulants: Secondary | ICD-10-CM

## 2018-04-05 DIAGNOSIS — Z8673 Personal history of transient ischemic attack (TIA), and cerebral infarction without residual deficits: Secondary | ICD-10-CM | POA: Insufficient documentation

## 2018-04-05 DIAGNOSIS — I129 Hypertensive chronic kidney disease with stage 1 through stage 4 chronic kidney disease, or unspecified chronic kidney disease: Secondary | ICD-10-CM | POA: Insufficient documentation

## 2018-04-05 DIAGNOSIS — I2510 Atherosclerotic heart disease of native coronary artery without angina pectoris: Secondary | ICD-10-CM | POA: Insufficient documentation

## 2018-04-05 DIAGNOSIS — N189 Chronic kidney disease, unspecified: Secondary | ICD-10-CM | POA: Diagnosis not present

## 2018-04-05 DIAGNOSIS — Z79899 Other long term (current) drug therapy: Secondary | ICD-10-CM | POA: Diagnosis not present

## 2018-04-05 DIAGNOSIS — Z87891 Personal history of nicotine dependence: Secondary | ICD-10-CM | POA: Insufficient documentation

## 2018-04-05 LAB — CBC WITH DIFFERENTIAL/PLATELET
BASOS PCT: 1 %
Basophils Absolute: 0 10*3/uL (ref 0.0–0.1)
EOS ABS: 0.4 10*3/uL (ref 0.0–0.7)
EOS PCT: 7 %
HEMATOCRIT: 41.8 % (ref 39.0–52.0)
Hemoglobin: 13.7 g/dL (ref 13.0–17.0)
Lymphocytes Relative: 14 %
Lymphs Abs: 0.8 10*3/uL (ref 0.7–4.0)
MCH: 32.1 pg (ref 26.0–34.0)
MCHC: 32.8 g/dL (ref 30.0–36.0)
MCV: 97.9 fL (ref 78.0–100.0)
MONO ABS: 0.5 10*3/uL (ref 0.1–1.0)
MONOS PCT: 8 %
NEUTROS ABS: 4.2 10*3/uL (ref 1.7–7.7)
Neutrophils Relative %: 70 %
Platelets: 155 10*3/uL (ref 150–400)
RBC: 4.27 MIL/uL (ref 4.22–5.81)
RDW: 13.6 % (ref 11.5–15.5)
WBC: 6 10*3/uL (ref 4.0–10.5)

## 2018-04-05 MED ORDER — SILVER NITRATE-POT NITRATE 75-25 % EX MISC
1.0000 | Freq: Once | CUTANEOUS | Status: AC
  Administered 2018-04-05: 1 via TOPICAL
  Filled 2018-04-05: qty 1

## 2018-04-05 MED ORDER — OXYMETAZOLINE HCL 0.05 % NA SOLN
1.0000 | Freq: Once | NASAL | Status: AC
  Administered 2018-04-05: 1 via NASAL
  Filled 2018-04-05: qty 15

## 2018-04-05 NOTE — ED Notes (Signed)
Bed: [id number] Expected date:  Expected time:  Means of arrival:  Comments: 84 yo m nosebleed

## 2018-04-05 NOTE — Discharge Instructions (Addendum)
Hold Eliquis and Aspirin today. Use saline spray to nose to keep nose moist.

## 2018-04-05 NOTE — ED Triage Notes (Signed)
[facility] with complaints of nosebleed that started when he woke up this morning. Patient is on Eliquis and Aspirin.Bleeding continues.

## 2018-04-05 NOTE — Telephone Encounter (Signed)
Pt's wife called with her husband waking up to a nose bleed this morning. States the bed had a large amount of blood in it. He is on blood thinners (hx of stroke). Denying any other symptoms, no dizziness. She took his b/p and only could remember the systolic of 136. Pt is alert. No more bleeding noted right now. Did not have to pack his nose. And denies picking at nose also. Appointment scheduled for today. Pt needed earlier appointment (his pcp had a 4:20 appointment). [facility]  Will call back for worsening symptoms.  Reason for Disposition . Taking Coumadin (warfarin) or other strong blood thinner, or known bleeding disorder (e.g., thrombocytopenia)  Answer Assessment - Initial Assessment Questions 1. AMOUNT OF BLEEDING: "How bad is the bleeding?" "How much blood was lost?" "Has the bleeding stopped?"   - MILD: needed a couple tissues   - MODERATE: needed many tissues   - SEVERE: large blood clots, soaked many tissues, lasted more than 30 minutes      Large amount in the bed (happen over night) 2. ONSET: "When did the nosebleed start?"      Last night 3. FREQUENCY: "How many nosebleeds have you had in the last 24 hours?"      none 4. RECURRENT SYMPTOMS: "Have there been other recent nosebleeds?" If so, ask: "How long did it take you to stop the bleeding?" "What worked best?"      Not recently 5. CAUSE: "What do you think caused this nosebleed?"     Not sure 6. LOCAL FACTORS: "Do you have any cold symptoms?", "Have you been rubbing or picking at your nose?"     no 7. SYSTEMIC FACTORS: "Do you have high blood pressure or any bleeding problems?"     no 8. BLOOD THINNERS: "Do you take any blood thinners?" (e.g., coumadin, heparin, aspirin, Plavix and asa     Blood thinner: eliquis 9. OTHER SYMPTOMS: "Do you have any other symptoms?" (e.g., lightheadedness)     not  Protocols used: NOSEBLEED-A-AH

## 2018-04-05 NOTE — ED Provider Notes (Signed)
[facility]-EMERGENCY DEPT Provider Note   CSN: [id number] Arrival date & time: 04/05/18  1031     History   Chief Complaint Chief Complaint  Patient presents with  . Epistaxis    HPI Kent Nolan is a 84 y.o. male.  Pt presents to the ED today with a nosebleed.  Pt said he woke up with the nosebleed.  Pt is on Eliquis and ASA.  [place] here.  No treatment pta.     Past Medical History:  Diagnosis Date  . BRADYCARDIA 08/04/2010   s/p PPM since 2011 - generator change 12/03/13  . Chronic kidney disease    followed by nephrology  . Coronary artery disease    remote subendo MI in February 2005 with stents to RCA &LCX; s/p scute thrombosis of both stents in February 2010 with cardiogenic shock - s/p BMS to both lesions; follow up cath in September 2010 was satisfactory.  He is medically managed with aspirin/Plavix  . GERD (gastroesophageal reflux disease)   . High risk medication use    on amiodarone for PAF/VT since February 2010  . HTN (hypertension)   . Hyperlipidemia   . Obesity   . Pacemaker   . PAF (paroxysmal atrial fibrillation) (HCC)    s/p cardioversion in August 2011  . Stroke (HCC)   . Type II or unspecified type diabetes mellitus without mention of complication, uncontrolled     Patient Active Problem List   Diagnosis Date Noted  . Gait disturbance, post-stroke   . Cognitive deficit, post-stroke   . Stroke (cerebrum) (HCC) 10/23/2017  . 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 Lower back pain 03/30/2014  . Preventative health care 11/26/2013  . Diabetes (HCC) 11/26/2013  . Coronary artery disease   . PAF (paroxysmal atrial fibrillation) (HCC)   . GERD (gastroesophageal reflux disease)   . Hyperlipidemia   . Chronic kidney disease   . Pacemaker   . Essential hypertension, benign 08/04/2010  . Sick sinus syndrome (HCC)  08/04/2010    Past Surgical History:  Procedure Laterality Date  . EP IMPLANTABLE DEVICE N/A 02/25/2015   Procedure: Lead Revision/Repair;  Surgeon: Hillis RangeJames Allred, MD;  Location: [facility] INVASIVE CV LAB;  Service: Cardiovascular;  Laterality: N/A;  . IR CT HEAD LTD  10/23/2017  . IR PERCUTANEOUS ART THROMBECTOMY/INFUSION INTRACRANIAL INC DIAG ANGIO  10/23/2017  . PACEMAKER GENERATOR CHANGE  12/03/13   MDT Adapta L generator change for premature ERI by [name]  . PACEMAKER INSERTION  May 2011  . PERMANENT PACEMAKER GENERATOR CHANGE N/A 12/03/2013   Procedure: PERMANENT PACEMAKER GENERATOR CHANGE;  Surgeon: Gardiner RhymeJames D Allred, MD;  Location: [facility] CATH LAB;  Service: Cardiovascular;  Laterality: N/A;  . RADIOLOGY WITH ANESTHESIA N/A 10/23/2017   Procedure: RADIOLOGY WITH ANESTHESIA;  Surgeon: Julieanne Cottoneveshwar, Sanjeev, MD;  Location: [facility] OR;  Service: Radiology;  Laterality: N/A;        Home Medications    Prior to Admission medications   Medication Sig Start Date End Date Taking? Authorizing Provider  amiodarone (PACERONE) 200 MG tablet TAKE 1/2 TABLET (100mg ) BY MOUTH EVERY DAY 10/28/17  Yes Costello, Lamar BlinksMary A, NP  apixaban (ELIQUIS) 2.5 MG TABS tablet Take 1 tablet (2.5 mg total) by mouth 2 (two) times daily. 12/28/17  Yes Allred, Fayrene FearingJames, MD  aspirin 81 MG tablet Take 81 mg by mouth daily.     Yes  [provider]  Cholecalciferol (VITAMIN D-3 PO) Take 1 tablet by mouth daily.   Yes [provider]  famotidine (PEPCID) 40 MG tablet Take 1 tablet (40 mg total) by mouth daily. 07/06/17  Yes Allred, Fayrene Fearing, MD  fenofibrate (TRICOR) 145 MG tablet TAKE 1 TABLET BY MOUTH EVERY DAY 11/22/17  Yes Allred, Fayrene Fearing, MD  Multiple Vitamin (MULTIVITAMIN) tablet Take 1 tablet by mouth daily.     Yes [provider]  rosuvastatin (CRESTOR) 20 MG tablet TAKE 1 TABLET (20mg ) BY MOUTH EVERY DAY Patient taking differently: Take 20 mg by mouth every evening.  10/28/17  Yes Costello, Lamar Blinks, NP  nitroGLYCERIN  (NITROSTAT) 0.4 MG SL tablet Place 1 tablet (0.4 mg total) under the tongue every 5 (five) minutes as needed. For chest pain 02/27/15   [name], MD    Family History Family History  Problem Relation Age of Onset  . Heart disease Mother   . Stomach cancer Father   . Diabetes Father     Social History Social History   Tobacco Use  . Smoking status: Former Smoker    Packs/day: 3.00    Types: Cigarettes  . Smokeless tobacco: Never Used  . [place]  Substance Use Topics  . Alcohol use: No  . Drug use: No     Allergies   Penicillins and Sulfonamide derivatives   Review of Systems Review of Systems  HENT: Positive for nosebleeds.   All other systems reviewed and are negative.    Physical Exam Updated Vital Signs BP 140/72 (BP Location: Left Arm)   Pulse 74   Temp 98 F (36.7 C) (Oral)   Resp 18   SpO2 99%   Physical Exam  Constitutional: He is oriented to person, place, and time. He appears well-developed and well-nourished.  HENT:  Head: Normocephalic and atraumatic.  Right Ear: External ear normal.  Left Ear: External ear normal.  Nose: Epistaxis is observed.  Mouth/Throat: Oropharynx is clear and moist.  Eyes: Pupils are equal, round, and reactive to light. Conjunctivae and EOM are normal.  Neck: Normal range of motion. Neck supple.  Cardiovascular: Normal rate, regular rhythm, normal heart sounds and intact distal pulses.  Pulmonary/Chest: Effort normal and breath sounds normal.  Abdominal: Soft. Bowel sounds are normal.  Musculoskeletal: Normal range of motion.  Neurological: He is alert and oriented to person, place, and time. He displays tremor.  Skin: Skin is warm. Capillary refill takes less than 2 seconds.  Psychiatric: He has a normal mood and affect. His behavior is normal.  Nursing note and vitals reviewed.    ED Treatments / Results  Labs (all labs ordered are listed, but only abnormal results are  displayed) Labs Reviewed  CBC WITH DIFFERENTIAL/PLATELET    EKG None  Radiology No results found.  Procedures .Epistaxis Management Date/Time: 04/05/2018 12:06 PM Performed by: Jacalyn Lefevre, MD Authorized by: Jacalyn Lefevre, MD   Consent:    Consent obtained:  Verbal   Consent given by:  Patient   Risks discussed:  Bleeding   Alternatives discussed:  No treatment Anesthesia (see MAR for exact dosages):    Anesthesia method:  None Procedure details:    Treatment site:  L anterior   Treatment method:  Silver nitrate   Treatment complexity:  Limited   Treatment episode: initial   Post-procedure details:    Assessment:  Bleeding stopped   Patient tolerance of procedure:  Tolerated well, no immediate complications   (including  critical care time)  Medications Ordered in ED Medications  oxymetazoline (AFRIN) 0.05 % nasal spray 1 spray (1 spray Each Nare Given 04/05/18 1106)  silver nitrate applicators applicator 1 Stick (1 Stick Topical Given 04/05/18 1106)     Initial Impression / Assessment and Plan / ED Course  I have reviewed the triage vital signs and the nursing notes.  Pertinent labs & imaging results that were available during my care of the patient were reviewed by me and considered in my medical decision making (see chart for details).    Pt was able to ambulate with rolling walker.  No new bleeding.  Pt is stable for d/c.    Final Clinical Impressions(s) / ED Diagnoses   Final diagnoses:  Anterior epistaxis  On apixaban therapy    ED Discharge Orders    None       Jacalyn Lefevre, MD 04/05/18 1242

## 2018-04-11 ENCOUNTER — Encounter: Payer: Self-pay | Admitting: Internal Medicine

## 2018-04-11 ENCOUNTER — Ambulatory Visit (INDEPENDENT_AMBULATORY_CARE_PROVIDER_SITE_OTHER): Payer: Medicare HMO | Admitting: Internal Medicine

## 2018-04-11 VITALS — BP 98/68 | HR 90 | Ht 67.0 in | Wt 181.0 lb

## 2018-04-11 DIAGNOSIS — I10 Essential (primary) hypertension: Secondary | ICD-10-CM

## 2018-04-11 DIAGNOSIS — G8929 Other chronic pain: Secondary | ICD-10-CM | POA: Diagnosis not present

## 2018-04-11 DIAGNOSIS — M5442 Lumbago with sciatica, left side: Secondary | ICD-10-CM | POA: Diagnosis not present

## 2018-04-11 DIAGNOSIS — N183 Chronic kidney disease, stage 3 unspecified: Secondary | ICD-10-CM

## 2018-04-11 DIAGNOSIS — E119 Type 2 diabetes mellitus without complications: Secondary | ICD-10-CM | POA: Diagnosis not present

## 2018-04-11 MED ORDER — LIDOCAINE 5 % EX PTCH: 1.0000 | MEDICATED_PATCH | CUTANEOUS | 1 refills | Status: AC

## 2018-04-11 NOTE — Assessment & Plan Note (Signed)
stable overall by history and exam, recent data reviewed with pt, and pt to continue medical treatment as before,  to f/u any worsening symptoms or concerns Lab Results  Component Value Date   HGBA1C 7.6 (A) 02/08/2018

## 2018-04-11 NOTE — Patient Instructions (Addendum)
Please take all new medication as prescribed - the pain patch as needed  Please continue all other medications as before, and refills have been done if requested.  Please have the pharmacy call with any other refills you may need.  Please continue your efforts at being more active, low cholesterol diet, and weight control.  Please keep your appointments with your specialists as you may have planned

## 2018-04-11 NOTE — Progress Notes (Signed)
Subjective:    Patient ID: Kent Nolan, male    DOB: 1929/05/20, 84 y.o.   MRN: [id number]  HPI  Here to fu recent ED visit aug 1 with epistaxis s/p left cautery and doing well without recurrent bleeding since that time.Pt denies chest pain, increased sob or doe, wheezing, orthopnea, PND, increased LE swelling, palpitations, dizziness or syncope.  Pt denies new neurological symptoms such as new headache, or facial or extremity weakness or numbness   Pt denies polydipsia, polyuria    Pt continues to have recurring LBP without change in severity, bowel or bladder change, fever, wt loss,  worsening LE pain/numbness/weakness, gait change or falls, asking for pain better. Past Medical History:  Diagnosis Date  . BRADYCARDIA 08/04/2010   s/p PPM since 2011 - generator change 12/03/13  . Chronic kidney disease    followed by nephrology  . Coronary artery disease    remote subendo MI in February 2005 with stents to RCA &LCX; s/p scute thrombosis of both stents in February 2010 with cardiogenic shock - s/p BMS to both lesions; follow up cath in September 2010 was satisfactory.  He is medically managed with aspirin/Plavix  . GERD (gastroesophageal reflux disease)   . High risk medication use    on amiodarone for PAF/VT since February 2010  . HTN (hypertension)   . Hyperlipidemia   . Obesity   . Pacemaker   . PAF (paroxysmal atrial fibrillation) (HCC)    s/p cardioversion in August 2011  . Stroke (HCC)   . Type II or unspecified type diabetes mellitus without mention of complication, uncontrolled    Past Surgical History:  Procedure Laterality Date  . EP IMPLANTABLE DEVICE N/A 02/25/2015   Procedure: Lead Revision/Repair;  Surgeon: Hillis RangeJames Allred, MD;  Location: [facility];  Service: Cardiovascular;  Laterality: N/A;  . IR CT HEAD LTD  10/23/2017  . IR PERCUTANEOUS ART THROMBECTOMY/INFUSION INTRACRANIAL INC DIAG ANGIO  10/23/2017  . PACEMAKER GENERATOR CHANGE  12/03/13   MDT Adapta L  generator change for premature ERI by [name]  . PACEMAKER INSERTION  May 2011  . PERMANENT PACEMAKER GENERATOR CHANGE N/A 12/03/2013   Procedure: PERMANENT PACEMAKER GENERATOR CHANGE;  Surgeon: Gardiner RhymeJames D Allred, MD;  Location: [facility] CATH [facility];  Service: Cardiovascular;  Laterality: N/A;  . RADIOLOGY WITH ANESTHESIA N/A 10/23/2017   Procedure: RADIOLOGY WITH ANESTHESIA;  Surgeon: Julieanne Cottoneveshwar, Sanjeev, MD;  Location: [facility] OR;  Service: Radiology;  Laterality: N/A;    reports that he has quit smoking. His smoking use included cigarettes. He smoked 3.00 packs per day. He has never used smokeless tobacco. He reports that he does not drink alcohol or use drugs. family history includes Diabetes in his father; Heart disease in his mother; Stomach cancer in his father. Allergies  Allergen Reactions  . Penicillins Other (See Comments)    Childhood Allergy Has patient had a PCN reaction causing immediate rash, facial/tongue/throat swelling, SOB or lightheadedness with hypotension: Unknown Has patient had a PCN reaction causing severe rash involving mucus membranes or skin necrosis: Unknown Has patient had a PCN reaction that required hospitalization: Unknown Has patient had a PCN reaction occurring within the last 10 years: No If all of the above answers are "NO", then may proceed with Cephalosporin use.   . Sulfonamide Derivatives Other (See Comments)    Reaction unknown   Current Outpatient Medications on File Prior to Visit  Medication Sig Dispense Refill  . amiodarone (PACERONE) 200 MG tablet TAKE 1/2 TABLET (  100mg ) BY MOUTH EVERY DAY 15 tablet 9  . apixaban (ELIQUIS) 2.5 MG TABS tablet Take 1 tablet (2.5 mg total) by mouth 2 (two) times daily. 180 tablet 1  . aspirin 81 MG tablet Take 81 mg by mouth daily.      . Cholecalciferol (VITAMIN D-3 PO) Take 1 tablet by mouth daily.    . famotidine (PEPCID) 40 MG tablet Take 1 tablet (40 mg total) by mouth daily. 30 tablet 11  . fenofibrate (TRICOR) 145 MG  tablet TAKE 1 TABLET BY MOUTH EVERY DAY 90 tablet 1  . Multiple Vitamin (MULTIVITAMIN) tablet Take 1 tablet by mouth daily.      . nitroGLYCERIN (NITROSTAT) 0.4 MG SL tablet Place 1 tablet (0.4 mg total) under the tongue every 5 (five) minutes as needed. For chest pain 25 tablet 5  . rosuvastatin (CRESTOR) 20 MG tablet TAKE 1 TABLET (20mg ) BY MOUTH EVERY DAY (Patient taking differently: Take 20 mg by mouth every evening. ) 90 tablet 3   No current facility-administered medications on file prior to visit.    Review of Systems  Constitutional: Negative for other unusual diaphoresis or sweats HENT: Negative for ear discharge or swelling Eyes: Negative for other worsening visual disturbances Respiratory: Negative for stridor or other swelling  Gastrointestinal: Negative for worsening distension or other blood Genitourinary: Negative for retention or other urinary change Musculoskeletal: Negative for other MSK pain or swelling Skin: Negative for color change or other new lesions Neurological: Negative for worsening tremors and other numbness  Psychiatric/Behavioral: Negative for worsening agitation or other fatigue All other system neg per pt    Objective:   Physical Exam BP 98/68 (BP Location: Left Arm, Patient Position: Sitting, Cuff Size: Normal)   Pulse 90   Ht 5\' 7"  (1.702 m)   Wt 181 lb (82.1 kg)   SpO2 98%   BMI 28.35 kg/m  VS noted,  Constitutional: Pt appears in NAD HENT: Head: NCAT.  Right Ear: External ear normal.  Left Ear: External ear normal.  Eyes: . Pupils are equal, round, and reactive to light. Conjunctivae and EOM are normal Nose: without d/c or deformity Neck: Neck supple. Gross normal ROM Cardiovascular: Normal rate and regular rhythm.   Pulmonary/Chest: Effort normal and breath sounds without rales or wheezing.  Left lumbar mild tender paravertebral only Neurological: Pt is alert. At baseline orientation, motor grossly intact Skin: Skin is warm. No rashes,  other new lesions, no LE edema Psychiatric: Pt behavior is normal without agitation  No other exam findings Lab Results  Component Value Date   WBC 6.0 04/05/2018   HGB 13.7 04/05/2018   HCT 41.8 04/05/2018   PLT 155 04/05/2018   GLUCOSE 146 (H) 12/13/2017   CHOL 77 10/24/2017   TRIG 74 10/24/2017   HDL 30 (L) 10/24/2017   LDLDIRECT 45.4 07/23/2012   LDLCALC 32 10/24/2017   ALT 12 12/13/2017   AST 19 12/13/2017   NA 137 12/13/2017   K 4.6 12/13/2017   CL 99 12/13/2017   CREATININE 1.69 (H) 12/13/2017   BUN 22 12/13/2017   CO2 25 12/13/2017   TSH 2.770 12/13/2017   INR 1.04 10/23/2017   HGBA1C 7.6 (A) 02/08/2018   MICROALBUR 1.7 07/22/2015       Assessment & Plan:

## 2018-04-11 NOTE — Assessment & Plan Note (Signed)
stable overall by history and exam, recent data reviewed with pt, and pt to continue medical treatment as before,  to f/u any worsening symptoms or concerns  

## 2018-04-11 NOTE — Assessment & Plan Note (Signed)
Ok for lidoderm trial,  to f/u any worsening symptoms or concerns

## 2018-04-12 LAB — CUP PACEART REMOTE DEVICE CHECK
Implantable Lead Implant Date: 20110531
Implantable Lead Location: 753859
Implantable Lead Model: 4470
Implantable Lead Model: 5076
Implantable Lead Serial Number: 677341
Implantable Pulse Generator Implant Date: 20160622
MDC IDC LEAD IMPLANT DT: 20160622
MDC IDC LEAD LOCATION: 753860
MDC IDC SESS DTM: 20190808104729

## 2018-04-16 ENCOUNTER — Encounter (HOSPITAL_COMMUNITY): Payer: Self-pay | Admitting: *Deleted

## 2018-04-16 ENCOUNTER — Ambulatory Visit: Payer: Medicare HMO | Admitting: Nurse Practitioner

## 2018-04-16 ENCOUNTER — Emergency Department (HOSPITAL_COMMUNITY)
Admission: EM | Admit: 2018-04-16 | Discharge: 2018-04-17 | Disposition: A | Discharge status: Home / Self Care | Payer: Medicare HMO | Attending: Emergency Medicine | Admitting: Emergency Medicine

## 2018-04-16 ENCOUNTER — Encounter: Payer: Self-pay | Admitting: Nurse Practitioner

## 2018-04-16 ENCOUNTER — Other Ambulatory Visit: Payer: Self-pay

## 2018-04-16 VITALS — BP 128/80 | HR 74 | Ht 67.0 in | Wt 180.4 lb

## 2018-04-16 DIAGNOSIS — Z7982 Long term (current) use of aspirin: Secondary | ICD-10-CM | POA: Diagnosis not present

## 2018-04-16 DIAGNOSIS — I2510 Atherosclerotic heart disease of native coronary artery without angina pectoris: Secondary | ICD-10-CM

## 2018-04-16 DIAGNOSIS — Z79899 Other long term (current) drug therapy: Secondary | ICD-10-CM

## 2018-04-16 DIAGNOSIS — Z87891 Personal history of nicotine dependence: Secondary | ICD-10-CM | POA: Insufficient documentation

## 2018-04-16 DIAGNOSIS — I10 Essential (primary) hypertension: Secondary | ICD-10-CM

## 2018-04-16 DIAGNOSIS — R040 Epistaxis: Secondary | ICD-10-CM | POA: Insufficient documentation

## 2018-04-16 DIAGNOSIS — Z950 Presence of cardiac pacemaker: Secondary | ICD-10-CM | POA: Diagnosis not present

## 2018-04-16 DIAGNOSIS — I480 Paroxysmal atrial fibrillation: Secondary | ICD-10-CM

## 2018-04-16 DIAGNOSIS — E1122 Type 2 diabetes mellitus with diabetic chronic kidney disease: Secondary | ICD-10-CM | POA: Insufficient documentation

## 2018-04-16 DIAGNOSIS — I447 Left bundle-branch block, unspecified: Secondary | ICD-10-CM | POA: Diagnosis not present

## 2018-04-16 DIAGNOSIS — I129 Hypertensive chronic kidney disease with stage 1 through stage 4 chronic kidney disease, or unspecified chronic kidney disease: Secondary | ICD-10-CM | POA: Insufficient documentation

## 2018-04-16 DIAGNOSIS — Z7901 Long term (current) use of anticoagulants: Secondary | ICD-10-CM | POA: Diagnosis not present

## 2018-04-16 DIAGNOSIS — R001 Bradycardia, unspecified: Secondary | ICD-10-CM | POA: Diagnosis not present

## 2018-04-16 DIAGNOSIS — N189 Chronic kidney disease, unspecified: Secondary | ICD-10-CM | POA: Insufficient documentation

## 2018-04-16 DIAGNOSIS — E7800 Pure hypercholesterolemia, unspecified: Secondary | ICD-10-CM

## 2018-04-16 LAB — HEPATIC FUNCTION PANEL
ALT: 12 IU/L (ref 0–44)
AST: 18 IU/L (ref 0–40)
Albumin: 4 g/dL (ref 3.5–4.7)
Alkaline Phosphatase: 70 IU/L (ref 39–117)
Bilirubin Total: 0.4 mg/dL (ref 0.0–1.2)
Bilirubin, Direct: 0.2 mg/dL (ref 0.00–0.40)
Total Protein: 7 g/dL (ref 6.0–8.5)

## 2018-04-16 LAB — BASIC METABOLIC PANEL
BUN/Creatinine Ratio: 15 (ref 10–24)
BUN: 29 mg/dL — ABNORMAL HIGH (ref 8–27)
CO2: 23 mmol/L (ref 20–29)
Calcium: 9.6 mg/dL (ref 8.6–10.2)
Chloride: 104 mmol/L (ref 96–106)
Creatinine, Ser: 1.93 mg/dL — ABNORMAL HIGH (ref 0.76–1.27)
GFR calc Af Amer: 35 mL/min/{1.73_m2} — ABNORMAL LOW (ref >59)
GFR calc non Af Amer: 30 mL/min/{1.73_m2} — ABNORMAL LOW (ref >59)
Glucose: 155 mg/dL — ABNORMAL HIGH (ref 65–99)
Potassium: 4.2 mmol/L (ref 3.5–5.2)
Sodium: 141 mmol/L (ref 134–144)

## 2018-04-16 LAB — TSH: TSH: 4.03 u[IU]/mL (ref 0.450–4.500)

## 2018-04-16 MED ORDER — OXYMETAZOLINE HCL 0.05 % NA SOLN: 1.0000 | Freq: Once | NASAL | Status: DC

## 2018-04-16 MED ORDER — SILVER NITRATE-POT NITRATE 75-25 % EX MISC
1.0000 | Freq: Once | CUTANEOUS | Status: DC
  Filled 2018-04-16: qty 1

## 2018-04-16 NOTE — ED Triage Notes (Addendum)
Pt BIB [facility] d/t post cautherization of L.Nare a week. Pt has uncontrolled L.nare bleeding. Pt given 2 Afrin en route by EMS

## 2018-04-16 NOTE — Patient Instructions (Addendum)
We will be checking the following labs today - BMET, HPF & TSH   Medication Instructions:    Continue with your current medicines.     Testing/Procedures To Be Arranged:  N/A  Follow-Up:   See Dr. Johney FrameAllred in October  I will see you in April of 2020    Other Special Instructions:   N/A    If you need a refill on your cardiac medications before your next appointment, please call your pharmacy.   Call the [facility] office at [phone] if you have any questions, problems or concerns.

## 2018-04-16 NOTE — Addendum Note (Signed)
Addended by: Rosalio MacadamiaGERHARDT, Laurent Cargile C on: 04/16/2018 11:28 AM   Modules accepted: Orders

## 2018-04-16 NOTE — Progress Notes (Signed)
CARDIOLOGY OFFICE NOTE  Date:  04/16/2018    [name] Date of Birth: 1929/03/17 Medical Record #[id number]  PCP:  Corwin Levins, MD  Cardiologist:  [name]   Chief Complaint  Patient presents with  . Coronary Artery Disease    6 month check - seen for [name]    History of Present Illness: Kent Nolan is a 84 y.o. male who presents today for a follow up/post ER visit. Seen for [name]. Former patient of [name].   He has known ischemic heart disease with prior subendocardial MI in February of 2005 with stents to the RCA and LCX. He experienced acute thrombosis of both stents in February of 2010 that was associated with cardiogenic shock and had repeat stenting to both lesions at that time with non drug eluting stents placed. Follow up cath in September of 2010 was satisfactory. He is on chronic Plavix.   Other problems include DM, HTN, GERD, ACE related cough, HLD, PAF and VT back in 2010 and on chronic amiodarone therapy, CRI and remote UTIs. He does have an underlying pacemaker in place since 2011 and has had prior cardioversion for his atrial fib back in August of 2011. While he does have an elevated CHADS score, he has been maintained on chronic Plavix and aspirin therapy in light of his stent thrombosis - he  declined anticoagulation with coumadin/NOVAC in the past - has had no recurrent atrial fib. EF by echo from 2010 was 50 to 55%. Last generator change was in March of 2015.   I saw him back in February of 2015 - he was dizzy. Had "fallen out" several times since he was here - no significant injury yet. We did extensive testing at that time which included Myoview, echo, CT etc. Felt to have some degree of orthostasis. Has also had his generator replaced at the end of March 2015 per [name].   Seen by me in [facility] 2015. Still dizzy. Pretty limited from back pain - had been to see his PCP and was given Vicodin, muscle relaxer and  prednisone. Then saw [name] back in July as well. Device check ok. He was quite orthostatic at thatvisit here - almost passed out while weighing. Was using narcotics and felt to be oversedated as well. Lasix was stopped. Again, did not wish to change to coumadin/NOAC but preferred to stay on his Plavix and aspirin therapy.  I have seen him several times since - mostly limited by his backpain. He has had his atrial lead revisedon his pacemaker. Dose of amiodaronehas been able to becut back. Had a prior syncopal spell in 2017 - he did not seek attention.   He had a fall back in February of 2019 - noted to have left sided weakness - diagnosed with embolic stroke. Was taken off Plavix and placed on low dose Eliquis/aspirin per neurology. Remains on amiodarone. He underwent endovascular thrombectomy. Cerebral infarction due to right M1 occlusion due to AF - post CT of the head with possible small amount of postprocedure ICH. Procedure was complicated by right groin wound.   I then saw him back in April - he was really declining.   Was in the ER earlier this month with a nose bleed - had to have cautery to stop this.   Comes in today. Herewith his wife. He is pretty sedentary. Limited by his back. Going to try some lidoderm patches. He tells me he is "  ready to go". No further bleeding noted. No chest pain. No falls. Using his walker and cane.   Past Medical History:  Diagnosis Date  . BRADYCARDIA 08/04/2010   s/p PPM since 2011 - generator change 12/03/13  . Chronic kidney disease    followed by nephrology  . Coronary artery disease    remote subendo MI in February 2005 with stents to RCA &LCX; s/p scute thrombosis of both stents in February 2010 with cardiogenic shock - s/p BMS to both lesions; follow up cath in September 2010 was satisfactory.  He is medically managed with aspirin/Plavix  . GERD (gastroesophageal reflux disease)   . High risk medication use    on amiodarone for PAF/VT  since February 2010  . HTN (hypertension)   . Hyperlipidemia   . Obesity   . Pacemaker   . PAF (paroxysmal atrial fibrillation) (HCC)    s/p cardioversion in August 2011  . Stroke (HCC)   . Type II or unspecified type diabetes mellitus without mention of complication, uncontrolled     Past Surgical History:  Procedure Laterality Date  . EP IMPLANTABLE DEVICE N/A 02/25/2015   Procedure: Lead Revision/Repair;  Surgeon: Hillis RangeJames Allred, MD;  [facility]: [facility] INV[facility];  Service: Cardiovascular;  Laterality: N/A;  . IR CT HEAD LTD  10/23/2017  . IR PERCUTANEOUS ART THROMBECTOMY/INFUSION INTRACRANIAL INC DIAG ANGIO  10/23/2017  . PACEMAKER GENERATOR CHANGE  12/03/13   MDT Adapta L generator change for premature ERI by [name]  . PACEMAKER INSERTION  May 2011  . PERMANENT PACEMAKER GENERATOR CHANGE N/A 12/03/2013   Procedure: PERMANENT PACEMAKER GENERATOR CHANGE;  Surgeon: Gardiner RhymeJames D Allred, MD;  Location: [facility] CATH LAB;  Service: Cardiovascular;  Laterality: N/A;  . RADIOLOGY WITH ANESTHESIA N/A 10/23/2017   Procedure: RADIOLOGY WITH ANESTHESIA;  Surgeon: Julieanne Cottoneveshwar, Sanjeev, MD;  Location: [facility] OR;  Service: Radiology;  Laterality: N/A;     Medications: Current Meds  Medication Sig  . amiodarone (PACERONE) 200 MG tablet TAKE 1/2 TABLET (100mg ) BY MOUTH EVERY DAY  . apixaban (ELIQUIS) 2.5 MG TABS tablet Take 1 tablet (2.5 mg total) by mouth 2 (two) times daily.  Marland Kitchen. aspirin 81 MG tablet Take 81 mg by mouth daily.    . Cholecalciferol (VITAMIN D-3 PO) Take 1 tablet by mouth daily.  . famotidine (PEPCID) 40 MG tablet Take 1 tablet (40 mg total) by mouth daily.  . fenofibrate (TRICOR) 145 MG tablet TAKE 1 TABLET BY MOUTH EVERY DAY  . lidocaine (LIDODERM) 5 % Place 1 patch onto the skin daily. Remove & Discard patch within 12 hours or as directed by MD  . Multiple Vitamin (MULTIVITAMIN) tablet Take 1 tablet by mouth daily.    . nitroGLYCERIN (NITROSTAT) 0.4 MG SL tablet Place 1 tablet (0.4 mg total)  under the tongue every 5 (five) minutes as needed. For chest pain  . rosuvastatin (CRESTOR) 20 MG tablet Take 20 mg by mouth every evening.   . [DISCONTINUED] rosuvastatin (CRESTOR) 20 MG tablet TAKE 1 TABLET (20mg ) BY MOUTH EVERY DAY (Patient taking differently: Take 20 mg by mouth every evening. )     Allergies: Allergies  Allergen Reactions  . Penicillins Other (See Comments)    Childhood Allergy Has patient had a PCN reaction causing immediate rash, facial/tongue/throat swelling, SOB or lightheadedness with hypotension: Unknown Has patient had a PCN reaction causing severe rash involving mucus membranes or skin necrosis: Unknown Has patient had a PCN reaction that required hospitalization: Unknown Has patient had a  PCN reaction occurring within the last 10 years: No If all of the above answers are "NO", then may proceed with Cephalosporin use.   . Sulfonamide Derivatives Other (See Comments)    Reaction unknown    Social History: The patient  reports that he has quit smoking. His smoking use included cigarettes. He smoked 3.00 packs per day. He has never used smokeless tobacco. He reports that he does not drink alcohol or use drugs.   Family History: The patient's family history includes Diabetes in his father; Heart disease in his mother; Stomach cancer in his father.   Review of Systems: Please see the history of present illness.   Otherwise, the review of systems is positive for none.   All other systems are reviewed and negative.   Physical Exam: VS:  BP 128/80 (BP Location: Left Arm, Patient Position: Sitting, Cuff Size: Normal)   Pulse 74   Ht 5\' 7"  (1.702 m)   Wt 180 lb 6.4 oz (81.8 kg)   SpO2 99% Comment: at rest  BMI 28.25 kg/m  .  BMI Body mass index is 28.25 kg/m.  Wt Readings from Last 3 Encounters:  04/16/18 180 lb 6.4 oz (81.8 kg)  04/11/18 181 lb (82.1 kg)  04/03/18 181 lb 9.6 oz (82.4 kg)    General: Very soft spoken. He looks frail. Alert and in no  acute distress.   HEENT: Normal.  Neck: Supple, no JVD, carotid bruits, or masses noted.  Cardiac: Regular rate and rhythm. Heart tones are distant. No edema.  Respiratory:  Lungs are clear to auscultation bilaterally with normal work of breathing.  GI: Soft and nontender.  MS: No deformity or atrophy. Gait and ROM intact. He is using a walker.  Skin: Warm and dry. Color is normal.  Neuro:  Strength and sensation are intact and no gross focal deficits noted.  Psych: Alert, appropriate and with normal affect.   LABORATORY DATA:  EKG:  EKG is not ordered today.  Lab Results  Component Value Date   WBC 6.0 04/05/2018   HGB 13.7 04/05/2018   HCT 41.8 04/05/2018   PLT 155 04/05/2018   GLUCOSE 146 (H) 12/13/2017   CHOL 77 10/24/2017   TRIG 74 10/24/2017   HDL 30 (L) 10/24/2017   LDLDIRECT 45.4 07/23/2012   LDLCALC 32 10/24/2017   ALT 12 12/13/2017   AST 19 12/13/2017   NA 137 12/13/2017   K 4.6 12/13/2017   CL 99 12/13/2017   CREATININE 1.69 (H) 12/13/2017   BUN 22 12/13/2017   CO2 25 12/13/2017   TSH 2.770 12/13/2017   INR 1.04 10/23/2017   HGBA1C 7.6 (A) 02/08/2018   MICROALBUR 1.7 07/22/2015     BNP (last 3 results) No results for input(s): BNP in the last 8760 hours.  ProBNP (last 3 results) No results for input(s): PROBNP in the last 8760 hours.   Other Studies Reviewed Today:  Echo Study Conclusions 10/2017  - Left ventricle: The cavity size was normal. Wall thickness was normal. Systolic function was mildly to moderately reduced. The estimated ejection fraction was in the range of 40% to 45%. Diffuse hypokinesis. There is akinesis of the inferior and inferoseptal myocardium. Doppler parameters are consistent with abnormal left ventricular relaxation (grade 1 diastolic dysfunction). - Mitral valve: There was mild regurgitation. - Tricuspid valve: There was mild regurgitation.  Impressions:  - No cardiac source of emboli was  indentified.   Myoview Overall Impression from 10/2013:  Intermediate risk stress nuclear study  with  large (35% of myocardium) mostly fixed inferolateral defect  suggesting of LCx territory scar. No significant reversible  ischemia.  LV Wall Motion: LVEF 47%, with inferolateral akinesis  Chrystie Nose, MD, [facility] Board Certified in Nuclear Cardiology Attending Cardiologist [facility]   Assessment/Plan:  1. Prior stroke - he remains on aspirin/Eliquis.   2. Failure to thrive - tells me he is ready to die. We've had a good talk today. He is very appreciative for all my care.   3. Underlying SSS - pacemaker in place - prior atrial lead revisionfrom 2016.Followed by [name] - sees him in October.  4. PAF - he and his family had previously not wished to be on anticoagulation. He remains pretty unsteady. Remains on aspirin/Eliquis  5. CAD - No ischemic symptoms reported - I have left him on his current regimen. He has a taxus stent in place. I have left him on baby aspirin for now - he was placed on aspirin/Eliquis by neuro - we may need to readdress this if he has recurrent bleeding.   6. HLD - on statin  7. High risk therapy -obtain the rest of his surveillance labs today.  8. LV dysfunction -recent echo noted - would favor conservative management. He has no active symptoms of heart failure.   9. Recent episode of epistaxis - resolved with cautery - for now we will continue his current anticoagulation. If he has more issues, this will need to be readdressed.   Current medicines are reviewed with the patient today.  The patient does not have concerns regarding medicines other than what has been noted above.  The following changes have been made:  See above.  Labs/ tests ordered today include:   No orders of the defined types were placed in this encounter.    Disposition:   FU with [name] in October. See me in April of 2020.   Patient is  agreeable to this plan and will call if any problems develop in the interim.   SignedNorma Fredrickson, NP  04/16/2018 11:24 AM  [facility] [address] [place], [place]  [address] Phone: [phone] Fax: [phone]

## 2018-04-16 NOTE — ED Triage Notes (Signed)
Pt from home, reports epistaxis from L nares tonight.  Afrin administered en route.  Pt is HOH.  Reports dizziness when standing up.

## 2018-04-17 ENCOUNTER — Ambulatory Visit: Payer: Medicare HMO | Admitting: Nurse Practitioner

## 2018-04-17 ENCOUNTER — Encounter (HOSPITAL_COMMUNITY): Payer: Self-pay | Admitting: Emergency Medicine

## 2018-04-17 NOTE — ED Notes (Signed)
Pt is using the urinal with his wife's assistance.  No nasal bleeding noted

## 2018-04-17 NOTE — ED Notes (Signed)
Pt is resting and appears comfortable with family at bedside.   

## 2018-04-17 NOTE — ED Provider Notes (Signed)
[facility] Provider Note   CSN: [id number] Arrival date & time: 04/16/18  2308     History   Chief Complaint Chief Complaint  Patient presents with  . Epistaxis    HPI Kent Nolan is a 84 y.o. male.  The history is provided by the patient.  Epistaxis   This is a recurrent problem. The current episode started 1 to 2 hours ago. The problem occurs constantly. The problem has not changed since onset.The problem is associated with anticoagulants and aspirin. The bleeding has been from the left nare. He has tried vasoconstrictors (Afrin) for the symptoms. The treatment provided significant relief. His past medical history does not include nose-picking.    Past Medical History:  Diagnosis Date  . BRADYCARDIA 08/04/2010   s/p PPM since 2011 - generator change 12/03/13  . Chronic kidney disease    followed by nephrology  . Coronary artery disease    remote subendo MI in February 2005 with stents to RCA &LCX; s/p scute thrombosis of both stents in February 2010 with cardiogenic shock - s/p BMS to both lesions; follow up cath in September 2010 was satisfactory.  He is medically managed with aspirin/Plavix  . GERD (gastroesophageal reflux disease)   . High risk medication use    on amiodarone for PAF/VT since February 2010  . HTN (hypertension)   . Hyperlipidemia   . Obesity   . Pacemaker   . PAF (paroxysmal atrial fibrillation) (HCC)    s/p cardioversion in August 2011  . Stroke (HCC)   . Type II or unspecified type diabetes mellitus without mention of complication, uncontrolled     Patient Active Problem List   Diagnosis Date Noted  . Gait disturbance, post-stroke   . Cognitive deficit, post-stroke   . Stroke (cerebrum) (HCC) 10/23/2017  . 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 Lower back pain 03/30/2014  . Preventative health care 11/26/2013  . Diabetes (HCC) 11/26/2013  .  Coronary artery disease   . PAF (paroxysmal atrial fibrillation) (HCC)   . GERD (gastroesophageal reflux disease)   . Hyperlipidemia   . Chronic kidney disease   . Pacemaker   . Essential hypertension, benign 08/04/2010  . Sick sinus syndrome (HCC) 08/04/2010    Past Surgical History:  Procedure Laterality Date  . EP IMPLANTABLE DEVICE N/A 02/25/2015   Procedure: Lead Revision/Repair;  Surgeon: Hillis RangeJames Allred, MD;  [facility]: [facility] INVASIVE [facility] LAB;  Service: Cardiovascular;  Laterality: N/A;  . IR CT HEAD LTD  10/23/2017  . IR PERCUTANEOUS ART THROMBECTOMY/INFUSION INTRACRANIAL INC DIAG ANGIO  10/23/2017  . PACEMAKER GENERATOR CHANGE  12/03/13   MDT Adapta L generator change for premature ERI by [name]  . PACEMAKER INSERTION  May 2011  . PERMANENT PACEMAKER GENERATOR CHANGE N/A 12/03/2013   Procedure: PERMANENT PACEMAKER GENERATOR CHANGE;  Surgeon: Gardiner RhymeJames D Allred, MD;  Location: [facility] CATH LAB;  Service: Cardiovascular;  Laterality: N/A;  . RADIOLOGY WITH ANESTHESIA N/A 10/23/2017   Procedure: RADIOLOGY WITH ANESTHESIA;  Surgeon: Julieanne Cottoneveshwar, Sanjeev, MD;  Location: [facility] OR;  Service: Radiology;  Laterality: N/A;        Home Medications    Prior to Admission medications   Medication Sig Start Date End Date Taking? Authorizing Provider  amiodarone (PACERONE) 200 MG tablet TAKE 1/2 TABLET (100mg ) BY MOUTH EVERY DAY 10/28/17   Beryl Meagerostello, Mary A, NP  apixaban (ELIQUIS) 2.5 MG TABS tablet Take 1 tablet (2.5 mg total)  by mouth 2 (two) times daily. 12/28/17   [name], MD  aspirin 81 MG tablet Take 81 mg by mouth daily.      [provider]  Cholecalciferol (VITAMIN D-3 PO) Take 1 tablet by mouth daily.    [provider]  famotidine (PEPCID) 40 MG tablet Take 1 tablet (40 mg total) by mouth daily. 07/06/17   Allred, Fayrene Fearing, MD  fenofibrate (TRICOR) 145 MG tablet TAKE 1 TABLET BY MOUTH EVERY DAY 11/22/17   Allred, Fayrene Fearing, MD  lidocaine (LIDODERM) 5 % Place 1 patch onto the skin daily.  Remove & Discard patch within 12 hours or as directed by MD 04/11/18   Corwin Levins, MD  Multiple Vitamin (MULTIVITAMIN) tablet Take 1 tablet by mouth daily.      [provider]  nitroGLYCERIN (NITROSTAT) 0.4 MG SL tablet Place 1 tablet (0.4 mg total) under the tongue every 5 (five) minutes as needed. For chest pain 02/27/15   Allred, Fayrene Fearing, MD  rosuvastatin (CRESTOR) 20 MG tablet Take 20 mg by mouth every evening.     [provider]    Family History Family History  Problem Relation Age of Onset  . Heart disease Mother   . Stomach cancer Father   . Diabetes Father     Social History Social History   Tobacco Use  . Smoking status: Former Smoker    Packs/day: 3.00    Types: Cigarettes  . Smokeless tobacco: Never Used  . Tobacco comment: back [place]  Substance Use Topics  . Alcohol use: No  . Drug use: No     Allergies   Penicillins and Sulfonamide derivatives   Review of Systems Review of Systems  Constitutional: Negative for fever.  HENT: Positive for nosebleeds.   Respiratory: Negative for shortness of breath.   Cardiovascular: Negative for chest pain, palpitations and leg swelling.  Gastrointestinal: Negative for abdominal pain.  All other systems reviewed and are negative.    Physical Exam Updated Vital Signs BP (!) 173/86 (BP Location: Right Arm)   Pulse 64   Temp 97.8 F (36.6 C) (Oral)   Resp 16   Ht 5\' 7"  (1.702 m)   Wt 80.3 kg   SpO2 96%   BMI 27.72 kg/m   Physical Exam  Constitutional: He is oriented to person, place, and time. He appears well-developed and well-nourished. No distress.  HENT:  Head: Normocephalic and atraumatic.  Right Ear: External ear normal.  Left Ear: External ear normal.  Nose: Nose normal.  Mouth/Throat: Oropharynx is clear and moist. No oropharyngeal exudate.  No bleeding at the time of exam  Eyes: Pupils are equal, round, and reactive to light. Conjunctivae are normal.  Neck: Normal range of  motion. Neck supple.  Cardiovascular: Normal rate, regular rhythm, normal heart sounds and intact distal pulses.  Pulmonary/Chest: Effort normal and breath sounds normal. No stridor. He has no wheezes. He has no rales.  Abdominal: Soft. Bowel sounds are normal. He exhibits no mass. There is no tenderness. There is no rebound and no guarding.  Musculoskeletal: Normal range of motion.  Neurological: He is alert and oriented to person, place, and time. He displays normal reflexes.  Skin: Skin is warm and dry. Capillary refill takes less than 2 seconds.  Psychiatric: He has a normal mood and affect.     ED Treatments / Results  Labs (all labs ordered are listed, but only abnormal results are displayed) Labs Reviewed - No data to display  EKG None  Radiology No results found.  Procedures Procedures (including critical care time)  Medications Ordered in ED Medications  silver nitrate applicators applicator 1 Stick (has no administration in time range)  oxymetazoline (AFRIN) 0.05 % nasal spray 1 spray (has no administration in time range)      Final Clinical Impressions(s) / ED Diagnoses   Observed in the ED no further bleeding.  Will refer to ENT.    Return for numbness, changes in vision or speech, fevers >100.4 unrelieved by medication, shortness of breath, intractable vomiting, or diarrhea, abdominal pain, Inability to tolerate liquids or food, cough, altered mental status or any concerns. No signs of systemic illness or infection. The patient is nontoxic-appearing on exam and vital signs are within normal limits. Will refer to urology for microscopy hematuria as patient is asymptomatic.  I have reviewed the triage vital signs and the nursing notes. Pertinent labs &imaging results that were available during my care of the patient were reviewed by me and considered in my medical decision making (see chart for details).  After history, exam, and medical workup I feel the  patient has been appropriately medically screened and is safe for discharge home. Pertinent diagnoses were discussed with the patient. Patient was given return precautions.   Myya Meenach, MD 04/17/18 [id number]

## 2018-04-18 DIAGNOSIS — R040 Epistaxis: Secondary | ICD-10-CM | POA: Insufficient documentation

## 2018-04-19 ENCOUNTER — Telehealth: Payer: Self-pay | Admitting: *Deleted

## 2018-04-19 NOTE — Telephone Encounter (Signed)
Per CoverMyMeds: Lidocaine 5% patch PA was  Denied on April 16 2018 PA Case: [id number], Status: Denied. Questions? Contact [phone].

## 2018-04-19 NOTE — Progress Notes (Signed)
I agree with the above plan 

## 2018-05-11 ENCOUNTER — Other Ambulatory Visit: Payer: Self-pay | Admitting: Internal Medicine

## 2018-05-28 ENCOUNTER — Ambulatory Visit (INDEPENDENT_AMBULATORY_CARE_PROVIDER_SITE_OTHER): Payer: Medicare HMO

## 2018-05-28 DIAGNOSIS — Z23 Encounter for immunization: Secondary | ICD-10-CM

## 2018-05-29 ENCOUNTER — Encounter: Payer: Self-pay | Admitting: Internal Medicine

## 2018-06-13 ENCOUNTER — Ambulatory Visit: Payer: Medicare HMO | Admitting: Internal Medicine

## 2018-06-13 ENCOUNTER — Ambulatory Visit (INDEPENDENT_AMBULATORY_CARE_PROVIDER_SITE_OTHER): Payer: Medicare HMO | Admitting: *Deleted

## 2018-06-13 ENCOUNTER — Encounter: Payer: Self-pay | Admitting: Internal Medicine

## 2018-06-13 VITALS — BP 116/66 | HR 77 | Ht 67.0 in | Wt 177.8 lb

## 2018-06-13 DIAGNOSIS — I10 Essential (primary) hypertension: Secondary | ICD-10-CM

## 2018-06-13 DIAGNOSIS — I480 Paroxysmal atrial fibrillation: Secondary | ICD-10-CM | POA: Diagnosis not present

## 2018-06-13 DIAGNOSIS — I2510 Atherosclerotic heart disease of native coronary artery without angina pectoris: Secondary | ICD-10-CM | POA: Diagnosis not present

## 2018-06-13 DIAGNOSIS — I495 Sick sinus syndrome: Secondary | ICD-10-CM

## 2018-06-13 NOTE — Patient Instructions (Addendum)
Medication Instructions:  Your physician recommends that you continue on your current medications as directed. Please refer to the Current Medication list given to you today.  If you need a refill on your cardiac medications before your next appointment, please call your pharmacy.   Lab work: TODAY: CBC, BMET, LFTS TSH  If you have labs (blood work) drawn today and your tests are completely normal, you will receive your results only by: Marland Kitchen MyChart Message (if you have MyChart) OR . A paper copy in the mail If you have any lab test that is abnormal or we need to change your treatment, we will call you to review the results.  Testing/Procedures: None ordered  Follow-Up: Remote monitoring is used to monitor your Pacemaker from home. This monitoring reduces the number of office visits required to check your device to one time per year. It allows [place] to keep an eye on the functioning of your device to ensure it is working properly. You are scheduled for a device check from home on 09/12/17. You may send your transmission at any time that day. If you have a wireless device, the transmission will be sent automatically. After your physician reviews your transmission, you will receive a postcard with your next transmission date.  Your physician recommends that you schedule a follow-up appointment in: 6 months with Norma Fredrickson, NP  Your physician recommends that you schedule a follow-up appointment in: 1 year with [name]      Any Other Special Instructions Will Be Listed Below (If Applicable).

## 2018-06-13 NOTE — Progress Notes (Signed)
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  Since last being seen in our clinic, the patient has declined.  He is s/p stroke.  He is now followed by Dr Pearlean Brownie.  He is not very active.  + SOB with moderate activity. Today, he denies symptoms of palpitations, chest pain,   lower extremity edema, dizziness, presyncope, or syncope.  The patient is otherwise without complaint today.   Past Medical History:  Diagnosis Date  . BRADYCARDIA 08/04/2010   s/p PPM since 2011 - generator change 12/03/13  . Chronic kidney disease    followed by nephrology  . Coronary artery disease    remote subendo MI in February 2005 with stents to RCA &LCX; s/p scute thrombosis of both stents in February 2010 with cardiogenic shock - s/p BMS to both lesions; follow up cath in September 2010 was satisfactory.  He is medically managed with aspirin/Plavix  . GERD (gastroesophageal reflux disease)   . High risk medication use    on amiodarone for PAF/VT since February 2010  . HTN (hypertension)   . Hyperlipidemia   . Obesity   . Pacemaker   . PAF (paroxysmal atrial fibrillation) (HCC)    s/p cardioversion in August 2011  . Stroke (HCC)   . Type II or unspecified type diabetes mellitus without mention of complication, uncontrolled    Past Surgical History:  Procedure Laterality Date  . EP IMPLANTABLE DEVICE N/A 02/25/2015   Procedure: Lead Revision/Repair;  Surgeon: [name], MD;  Location: [facility];  Service: Cardiovascular;  Laterality: N/A;  . IR CT HEAD LTD  10/23/2017  . IR PERCUTANEOUS ART THROMBECTOMY/INFUSION INTRACRANIAL INC DIAG ANGIO  10/23/2017  . PACEMAKER GENERATOR CHANGE  12/03/13   MDT Adapta L generator change for premature ERI by Dr Johney Frame  . PACEMAKER INSERTION  May 2011  . PERMANENT PACEMAKER GENERATOR CHANGE N/A 12/03/2013   Procedure: PERMANENT PACEMAKER GENERATOR CHANGE;   Surgeon: [name], MD;  Location: [facility];  Service: Cardiovascular;  Laterality: N/A;  . RADIOLOGY WITH ANESTHESIA N/A 10/23/2017   Procedure: RADIOLOGY WITH ANESTHESIA;  Surgeon: [name], MD;  Location: [facility] OR;  Service: Radiology;  Laterality: N/A;    ROS- all systems are reviewed and negative except as per HPI above  Current Outpatient Medications  Medication Sig Dispense Refill  . amiodarone (PACERONE) 200 MG tablet TAKE 1/2 TABLET (100mg ) BY MOUTH EVERY DAY (Patient taking differently: Take 100 mg by mouth daily. ) 15 tablet 9  . apixaban (ELIQUIS) 2.5 MG TABS tablet Take 1 tablet (2.5 mg total) by mouth 2 (two) times daily. 180 tablet 1  . aspirin 81 MG tablet Take 81 mg by mouth daily.      . Cholecalciferol (VITAMIN D-3 PO) Take 1 tablet by mouth daily.    . famotidine (PEPCID) 40 MG tablet Take 1 tablet (40 mg total) by mouth daily. 30 tablet 11  . fenofibrate (TRICOR) 145 MG tablet TAKE 1 TABLET BY MOUTH EVERY DAY 90 tablet 3  . lidocaine (LIDODERM) 5 % Place 1 patch onto the skin daily. Remove & Discard patch within 12 hours or as directed by MD 60 patch 1  . Multiple Vitamin (MULTIVITAMIN) tablet Take 1 tablet by mouth daily.      . nitroGLYCERIN (NITROSTAT) 0.4 MG SL tablet Place 1 tablet (0.4 mg total) under the tongue  every 5 (five) minutes as needed. For chest pain 25 tablet 5  . rosuvastatin (CRESTOR) 20 MG tablet Take 20 mg by mouth every evening.      No current facility-administered medications for this visit.     Physical Exam: Vitals:   06/13/18 1124  BP: 116/66  Pulse: 77  SpO2: 98%  Weight: 177 lb 12.8 oz (80.6 kg)  Height: 5\' 7"  (1.702 m)    GEN- The patient is elderly and frail appearing, alert and oriented x 3 today.   Head- normocephalic, atraumatic Eyes-  Sclera clear, conjunctiva pink Ears- hearing intact Oropharynx- clear Lungs- Clear to ausculation bilaterally, normal work of breathing Chest- pacemaker pocket is well  healed Heart- Regular rate and rhythm, no murmurs, rubs or gallops, PMI not laterally displaced GI- soft, NT, ND, + BS Extremities- no clubbing, cyanosis, or edema Skin- + ecchymosis on both arms  Pacemaker interrogation- reviewed in detail today,  See PACEART report  ekg tracing ordered today is personally reviewed and shows AV paced rhythm  Assessment and Plan:  1. Symptomatic sinus bradycardia  He now has developed second degree AV block and is V pacing more Normal pacemaker function See Pace Art report Programmed DDD today as MVP is no longer working  2. afib On eliquis and ASA per neurology.  I would advise that we stop ASA if ok with neurology On low dose amiodarone check lfts, tfts today bmet and cbc today  3. CAD No ischemic symptoms Stop ASA given bleeding risks if ok with neurology  4. Ischemic CM euvolemic conservative measures  5. CRI bmet today  [facility] Return to see Lawson Fiscal in 6 months Return in a year  [name] MD, [facility] 06/13/2018 11:55 AM

## 2018-06-14 LAB — HEPATIC FUNCTION PANEL
ALBUMIN: 4 g/dL (ref 3.5–4.7)
ALT: 11 IU/L (ref 0–44)
AST: 15 IU/L (ref 0–40)
Alkaline Phosphatase: 71 IU/L (ref 39–117)
BILIRUBIN TOTAL: 0.5 mg/dL (ref 0.0–1.2)
Bilirubin, Direct: 0.2 mg/dL (ref 0.00–0.40)
TOTAL PROTEIN: 6.8 g/dL (ref 6.0–8.5)

## 2018-06-14 LAB — BASIC METABOLIC PANEL
BUN/Creatinine Ratio: 15 (ref 10–24)
BUN: 26 mg/dL (ref 8–27)
CO2: 22 mmol/L (ref 20–29)
Calcium: 9.2 mg/dL (ref 8.6–10.2)
Chloride: 101 mmol/L (ref 96–106)
Creatinine, Ser: 1.76 mg/dL — ABNORMAL HIGH (ref 0.76–1.27)
GFR calc Af Amer: 39 mL/min/{1.73_m2} — ABNORMAL LOW (ref >59)
GFR, EST NON AFRICAN AMERICAN: 34 mL/min/{1.73_m2} — AB (ref >59)
GLUCOSE: 193 mg/dL — AB (ref 65–99)
POTASSIUM: 4.1 mmol/L (ref 3.5–5.2)
Sodium: 139 mmol/L (ref 134–144)

## 2018-06-14 LAB — CBC
HEMATOCRIT: 42.2 % (ref 37.5–51.0)
Hemoglobin: 14.2 g/dL (ref 13.0–17.7)
MCH: 31.3 pg (ref 26.6–33.0)
MCHC: 33.6 g/dL (ref 31.5–35.7)
MCV: 93 fL (ref 79–97)
Platelets: 187 10*3/uL (ref 150–450)
RBC: 4.54 x10E6/uL (ref 4.14–5.80)
RDW: 11.7 % — AB (ref 12.3–15.4)
WBC: 6.5 10*3/uL (ref 3.4–10.8)

## 2018-06-14 LAB — TSH: TSH: 3.92 u[IU]/mL (ref 0.450–4.500)

## 2018-06-14 NOTE — Progress Notes (Signed)
Remote pacemaker transmission.   

## 2018-06-15 LAB — CUP PACEART INCLINIC DEVICE CHECK
Brady Statistic AP VP Percent: 87 %
Brady Statistic AP VS Percent: 4 %
Brady Statistic AS VP Percent: 8 %
Brady Statistic AS VS Percent: 1 %
Date Time Interrogation Session: 20191009145954
Implantable Lead Implant Date: 20110531
Implantable Lead Location: 753860
Implantable Lead Model: 5076
Implantable Lead Serial Number: 677341
Lead Channel Impedance Value: 394 Ohm
Lead Channel Pacing Threshold Amplitude: 0.75 V
Lead Channel Pacing Threshold Pulse Width: 0.4 ms
Lead Channel Pacing Threshold Pulse Width: 0.4 ms
Lead Channel Sensing Intrinsic Amplitude: 5.6 mV
Lead Channel Setting Pacing Pulse Width: 0.4 ms
MDC IDC LEAD IMPLANT DT: 20160622
MDC IDC LEAD LOCATION: 753859
MDC IDC MSMT BATTERY IMPEDANCE: 605 Ohm
MDC IDC MSMT BATTERY REMAINING LONGEVITY: 68 mo
MDC IDC MSMT BATTERY VOLTAGE: 2.79 V
MDC IDC MSMT LEADCHNL RA IMPEDANCE VALUE: 459 Ohm
MDC IDC MSMT LEADCHNL RA PACING THRESHOLD AMPLITUDE: 0.75 V
MDC IDC MSMT LEADCHNL RA PACING THRESHOLD AMPLITUDE: 1.75 V
MDC IDC MSMT LEADCHNL RA PACING THRESHOLD PULSEWIDTH: 0.4 ms
MDC IDC MSMT LEADCHNL RV PACING THRESHOLD AMPLITUDE: 1 V
MDC IDC MSMT LEADCHNL RV PACING THRESHOLD PULSEWIDTH: 0.4 ms
MDC IDC PG IMPLANT DT: 20160622
MDC IDC SET LEADCHNL RA PACING AMPLITUDE: 2 V
MDC IDC SET LEADCHNL RV PACING AMPLITUDE: 2.5 V
MDC IDC SET LEADCHNL RV SENSING SENSITIVITY: 2 mV

## 2018-06-19 ENCOUNTER — Other Ambulatory Visit: Payer: Self-pay | Admitting: Internal Medicine

## 2018-06-19 NOTE — Telephone Encounter (Signed)
Pt last saw Dr Johney Frame 06/13/18, last labs 06/13/18 Creat 1.76, age 84, weight 80.6kg, based on specified criteria pt is on appropriate dosage of Eliquis 2.5mg  BID.  Will refill rx.

## 2018-06-28 ENCOUNTER — Other Ambulatory Visit: Payer: Self-pay | Admitting: Internal Medicine

## 2018-07-04 DIAGNOSIS — N390 Urinary tract infection, site not specified: Secondary | ICD-10-CM | POA: Diagnosis not present

## 2018-07-04 DIAGNOSIS — N183 Chronic kidney disease, stage 3 (moderate): Secondary | ICD-10-CM | POA: Diagnosis not present

## 2018-07-13 ENCOUNTER — Other Ambulatory Visit: Payer: Self-pay | Admitting: Nephrology

## 2018-07-13 DIAGNOSIS — R319 Hematuria, unspecified: Secondary | ICD-10-CM

## 2018-07-18 ENCOUNTER — Ambulatory Visit
Admission: RE | Admit: 2018-07-18 | Discharge: 2018-07-18 | Disposition: A | Discharge status: Home / Self Care | Payer: Medicare HMO | Source: Ambulatory Visit | Attending: Nephrology | Admitting: Nephrology

## 2018-07-18 ENCOUNTER — Other Ambulatory Visit: Payer: Self-pay | Admitting: Internal Medicine

## 2018-07-18 DIAGNOSIS — R319 Hematuria, unspecified: Secondary | ICD-10-CM

## 2018-07-18 DIAGNOSIS — N281 Cyst of kidney, acquired: Secondary | ICD-10-CM | POA: Diagnosis not present

## 2018-07-18 DIAGNOSIS — N189 Chronic kidney disease, unspecified: Secondary | ICD-10-CM | POA: Diagnosis not present

## 2018-07-19 LAB — CUP PACEART REMOTE DEVICE CHECK
Implantable Lead Location: 753859
Implantable Lead Model: 4470
Implantable Lead Serial Number: 677341
Implantable Pulse Generator Implant Date: 20160622
MDC IDC LEAD IMPLANT DT: 20110531
MDC IDC LEAD IMPLANT DT: 20160622
MDC IDC LEAD LOCATION: 753860
MDC IDC SESS DTM: 20191114095500

## 2018-08-14 ENCOUNTER — Telehealth: Payer: Self-pay

## 2018-08-14 ENCOUNTER — Encounter: Payer: Self-pay | Admitting: Internal Medicine

## 2018-08-14 ENCOUNTER — Other Ambulatory Visit: Payer: Self-pay | Admitting: Internal Medicine

## 2018-08-14 ENCOUNTER — Ambulatory Visit (INDEPENDENT_AMBULATORY_CARE_PROVIDER_SITE_OTHER): Payer: Medicare HMO | Admitting: Internal Medicine

## 2018-08-14 VITALS — BP 124/86 | HR 87 | Temp 98.0°F | Ht 67.0 in | Wt 180.0 lb

## 2018-08-14 DIAGNOSIS — I10 Essential (primary) hypertension: Secondary | ICD-10-CM

## 2018-08-14 DIAGNOSIS — N183 Chronic kidney disease, stage 3 unspecified: Secondary | ICD-10-CM

## 2018-08-14 DIAGNOSIS — E119 Type 2 diabetes mellitus without complications: Secondary | ICD-10-CM

## 2018-08-14 DIAGNOSIS — H1010 Acute atopic conjunctivitis, unspecified eye: Secondary | ICD-10-CM | POA: Insufficient documentation

## 2018-08-14 DIAGNOSIS — E785 Hyperlipidemia, unspecified: Secondary | ICD-10-CM | POA: Diagnosis not present

## 2018-08-14 DIAGNOSIS — H1013 Acute atopic conjunctivitis, bilateral: Secondary | ICD-10-CM

## 2018-08-14 LAB — POCT GLYCOSYLATED HEMOGLOBIN (HGB A1C): HEMOGLOBIN A1C: 7.9 % — AB (ref 4.0–5.6)

## 2018-08-14 MED ORDER — GLIPIZIDE ER 2.5 MG PO TB24: 2.5000 mg | ORAL_TABLET | Freq: Every day | ORAL | 3 refills | Status: DC

## 2018-08-14 MED ORDER — AZELASTINE HCL 0.05 % OP SOLN: 1.0000 [drp] | Freq: Two times a day (BID) | OPHTHALMIC | 12 refills | Status: AC

## 2018-08-14 NOTE — Telephone Encounter (Signed)
-----   Message from Corwin LevinsJames W John, MD sent at 08/14/2018 12:34 PM EST ----- Letter sent, cont same tx except  The test results show that your current treatment is OK, except the A1c is too high, meaning that your overall sugar control is mildly worse.  We need to start a low dose of medication called glipizide ER 2.5 mg per day, to try to improve this at least somewhat, to help reduce the risk of the kidney function being damaged by high sugar.  I will send the prescription, and you should hear from the office as well.    Shirron to please inform pt, I will do rx

## 2018-08-14 NOTE — Assessment & Plan Note (Signed)
stable overall by history and exam, recent data reviewed with pt, and pt to continue medical treatment as before,  to f/u any worsening symptoms or concerns  

## 2018-08-14 NOTE — Patient Instructions (Addendum)
Please take all new medication as prescribed - the eye drops for allergies  Your A1c is ok today  Please continue all other medications as before, and refills have been done if requested.  Please have the pharmacy call with any other refills you may need.  Please continue your efforts at being more active, low cholesterol diet, and weight control  Please keep your appointments with your specialists as you may have planned  Please return in 6 months, or sooner if needed

## 2018-08-14 NOTE — Assessment & Plan Note (Addendum)
For a1c poct today, o/w stable overall by history and exam, recent data reviewed with pt, and pt to continue medical treatment as before,  to f/u any worsening symptoms or concerns

## 2018-08-14 NOTE — Assessment & Plan Note (Signed)
For optivar asd,  to f/u any worsening symptoms or concerns 

## 2018-08-14 NOTE — Telephone Encounter (Signed)
Pt has been informed of results and expressed understanding.  °

## 2018-08-14 NOTE — Progress Notes (Signed)
Subjective:    Patient ID: Kent Nolan, male    DOB: 11-12-1928, 84 y.o.   MRN: [id number]  HPI  Here to f/u; overall doing ok,  Pt denies chest pain, increasing sob or doe, wheezing, orthopnea, PND, increased LE swelling, palpitations, dizziness or syncope.  Pt denies new neurological symptoms such as new headache, or facial or extremity weakness or numbness.  Pt denies polydipsia, polyuria, or low sugar episode.  Pt states overall good compliance with meds, mostly trying to follow appropriate diet, with wt overall stable,  but little exercise however.  Does have several wks ongoing eye and nasal allergy symptoms with clearish congestion, itch and sneezing, without fever, pain, ST, cough, swelling or wheezing.  No other new complaints Past Medical History:  Diagnosis Date  . BRADYCARDIA 08/04/2010   s/p PPM since 2011 - generator change 12/03/13  . Chronic kidney disease    followed by nephrology  . Coronary artery disease    remote subendo MI in February 2005 with stents to RCA &LCX; s/p scute thrombosis of both stents in February 2010 with cardiogenic shock - s/p BMS to both lesions; follow up cath in September 2010 was satisfactory.  He is medically managed with aspirin/Plavix  . GERD (gastroesophageal reflux disease)   . High risk medication use    on amiodarone for PAF/VT since February 2010  . HTN (hypertension)   . Hyperlipidemia   . Obesity   . Pacemaker   . PAF (paroxysmal atrial fibrillation) (HCC)    s/p cardioversion in August 2011  . Stroke (HCC)   . Type II or unspecified type diabetes mellitus without mention of complication, uncontrolled    Past Surgical History:  Procedure Laterality Date  . EP IMPLANTABLE DEVICE N/A 02/25/2015   Procedure: Lead Revision/Repair;  Surgeon: [name], MD;  Location: [facility];  Service: Cardiovascular;  Laterality: N/A;  . IR CT HEAD LTD  10/23/2017  . IR PERCUTANEOUS ART THROMBECTOMY/INFUSION INTRACRANIAL INC DIAG ANGIO   10/23/2017  . PACEMAKER GENERATOR CHANGE  12/03/13   MDT Adapta L generator change for premature ERI by Dr Johney Frame  . PACEMAKER INSERTION  May 2011  . PERMANENT PACEMAKER GENERATOR CHANGE N/A 12/03/2013   Procedure: PERMANENT PACEMAKER GENERATOR CHANGE;  Surgeon: [name], MD;  Location: [facility];  Service: Cardiovascular;  Laterality: N/A;  . RADIOLOGY WITH ANESTHESIA N/A 10/23/2017   Procedure: RADIOLOGY WITH ANESTHESIA;  Surgeon: [name], MD;  Location: [facility] OR;  Service: Radiology;  Laterality: N/A;    reports that he has quit smoking. His smoking use included cigarettes. He smoked 3.00 packs per day. He has never used smokeless tobacco. He reports that he does not drink alcohol or use drugs. family history includes Diabetes in his father; Heart disease in his mother; Stomach cancer in his father. Allergies  Allergen Reactions  . Penicillins Other (See Comments)    Childhood Allergy Has patient had a PCN reaction causing immediate rash, facial/tongue/throat swelling, SOB or lightheadedness with hypotension: Unknown Has patient had a PCN reaction causing severe rash involving mucus membranes or skin necrosis: Unknown Has patient had a PCN reaction that required hospitalization: Unknown Has patient had a PCN reaction occurring within the last 10 years: No If all of the above answers are "NO", then may proceed with Cephalosporin use.   . Sulfonamide Derivatives Other (See Comments)    Reaction unknown   Current Outpatient Medications on File Prior to Visit  Medication Sig Dispense Refill  .  amiodarone (PACERONE) 200 MG tablet TAKE 1/2 TABLET (100mg ) BY MOUTH EVERY DAY (Patient taking differently: Take 100 mg by mouth daily. ) 15 tablet 9  . aspirin 81 MG tablet Take 81 mg by mouth daily.      . Cholecalciferol (VITAMIN D-3 PO) Take 1 tablet by mouth daily.    Marland Kitchen. ELIQUIS 2.5 MG TABS tablet TAKE 1 TABLET BY MOUTH TWICE A DAY 180 tablet 3  . famotidine (PEPCID) 40 MG tablet  TAKE 1 TABLET BY MOUTH EVERY DAY 90 tablet 3  . fenofibrate (TRICOR) 145 MG tablet TAKE 1 TABLET BY MOUTH EVERY DAY 90 tablet 3  . lidocaine (LIDODERM) 5 % Place 1 patch onto the skin daily. Remove & Discard patch within 12 hours or as directed by MD 60 patch 1  . Multiple Vitamin (MULTIVITAMIN) tablet Take 1 tablet by mouth daily.      . nitroGLYCERIN (NITROSTAT) 0.4 MG SL tablet Place 1 tablet (0.4 mg total) under the tongue every 5 (five) minutes as needed. For chest pain 25 tablet 5  . rosuvastatin (CRESTOR) 20 MG tablet Take 1 tablet (20 mg total) by mouth daily. 90 tablet 3   No current facility-administered medications on file prior to visit.    Review of Systems  Constitutional: Negative for other unusual diaphoresis or sweats HENT: Negative for ear discharge or swelling Eyes: Negative for other worsening visual disturbances Respiratory: Negative for stridor or other swelling  Gastrointestinal: Negative for worsening distension or other blood Genitourinary: Negative for retention or other urinary change Musculoskeletal: Negative for other MSK pain or swelling Skin: Negative for color change or other new lesions Neurological: Negative for worsening tremors and other numbness  Psychiatric/Behavioral: Negative for worsening agitation or other fatigue All other system neg per pt    Objective:   Physical Exam BP 124/86   Pulse 87   Temp 98 F (36.7 C) (Oral)   Ht 5\' 7"  (1.702 m)   Wt 180 lb (81.6 kg)   SpO2 98%   BMI 28.19 kg/m  VS noted,  Constitutional: Pt appears in NAD HENT: Head: NCAT.  Right Ear: External ear normal.  Left Ear: External ear normal.  Eyes: . Pupils are equal, round, and reactive to light. Conjunctivae and EOM are normal Bilat tm's with mild erythema.  Max sinus areas non tender.  Pharynx with mild erythema, no exudate Nose: without d/c or deformity Neck: Neck supple. Gross normal ROM Cardiovascular: Normal rate and regular rhythm.   Pulmonary/Chest:  Effort normal and breath sounds without rales or wheezing.  Abd:  Soft, NT, ND, + BS, no organomegaly Neurological: Pt is alert. At baseline orientation, motor grossly intact Skin: Skin is warm. No rashes, other new lesions, no LE edema Psychiatric: Pt behavior is normal without agitation  No other exam findings Lab Results  Component Value Date   WBC 6.5 06/13/2018   HGB 14.2 06/13/2018   HCT 42.2 06/13/2018   PLT 187 06/13/2018   GLUCOSE 193 (H) 06/13/2018   CHOL 77 10/24/2017   TRIG 74 10/24/2017   HDL 30 (L) 10/24/2017   LDLDIRECT 45.4 07/23/2012   LDLCALC 32 10/24/2017   ALT 11 06/13/2018   AST 15 06/13/2018   NA 139 06/13/2018   K 4.1 06/13/2018   CL 101 06/13/2018   CREATININE 1.76 (H) 06/13/2018   BUN 26 06/13/2018   CO2 22 06/13/2018   TSH 3.920 06/13/2018   INR 1.04 10/23/2017   HGBA1C 7.9 (A) 08/14/2018   MICROALBUR  1.7 07/22/2015          Assessment & Plan:

## 2018-09-12 ENCOUNTER — Ambulatory Visit (INDEPENDENT_AMBULATORY_CARE_PROVIDER_SITE_OTHER): Payer: PPO

## 2018-09-12 DIAGNOSIS — I480 Paroxysmal atrial fibrillation: Secondary | ICD-10-CM

## 2018-09-13 NOTE — Progress Notes (Signed)
Remote pacemaker transmission.   

## 2018-09-15 LAB — CUP PACEART REMOTE DEVICE CHECK
Battery Remaining Longevity: 64 mo
Battery Voltage: 2.79 V
Brady Statistic AP VS Percent: 0 %
Brady Statistic AS VP Percent: 4 %
Implantable Lead Location: 753860
Implantable Lead Model: 4470
Implantable Lead Model: 5076
Implantable Lead Serial Number: 677341
Lead Channel Pacing Threshold Amplitude: 0.875 V
Lead Channel Pacing Threshold Amplitude: 0.875 V
Lead Channel Pacing Threshold Pulse Width: 0.4 ms
Lead Channel Pacing Threshold Pulse Width: 0.4 ms
Lead Channel Setting Pacing Pulse Width: 0.4 ms
MDC IDC LEAD IMPLANT DT: 20110531
MDC IDC LEAD IMPLANT DT: 20160622
MDC IDC LEAD LOCATION: 753859
MDC IDC MSMT BATTERY IMPEDANCE: 655 Ohm
MDC IDC MSMT LEADCHNL RA IMPEDANCE VALUE: 465 Ohm
MDC IDC MSMT LEADCHNL RV IMPEDANCE VALUE: 384 Ohm
MDC IDC PG IMPLANT DT: 20160622
MDC IDC SESS DTM: 20200110182320
MDC IDC SET LEADCHNL RA PACING AMPLITUDE: 2 V
MDC IDC SET LEADCHNL RV PACING AMPLITUDE: 2.5 V
MDC IDC SET LEADCHNL RV SENSING SENSITIVITY: 2 mV
MDC IDC STAT BRADY AP VP PERCENT: 96 %
MDC IDC STAT BRADY AS VS PERCENT: 0 %

## 2018-10-08 ENCOUNTER — Ambulatory Visit: Payer: Medicare HMO | Admitting: Adult Health

## 2018-10-08 ENCOUNTER — Encounter: Payer: Self-pay | Admitting: Adult Health

## 2018-10-08 VITALS — BP 140/83 | HR 86 | Ht 67.0 in | Wt 187.4 lb

## 2018-10-08 DIAGNOSIS — I480 Paroxysmal atrial fibrillation: Secondary | ICD-10-CM | POA: Diagnosis not present

## 2018-10-08 DIAGNOSIS — I10 Essential (primary) hypertension: Secondary | ICD-10-CM

## 2018-10-08 DIAGNOSIS — E119 Type 2 diabetes mellitus without complications: Secondary | ICD-10-CM | POA: Diagnosis not present

## 2018-10-08 DIAGNOSIS — I63411 Cerebral infarction due to embolism of right middle cerebral artery: Secondary | ICD-10-CM

## 2018-10-08 DIAGNOSIS — E785 Hyperlipidemia, unspecified: Secondary | ICD-10-CM

## 2018-10-08 NOTE — Progress Notes (Signed)
I agree with the above plan 

## 2018-10-08 NOTE — Patient Instructions (Signed)
Continue aspirin 81 mg daily and Eliquis (apixaban) daily  and Crestor and fenofibrate for secondary stroke prevention  Continue to follow up with PCP regarding cholesterol and blood pressure management - request cholesterol levels at next appointment  Continue to stay active and maintain a healthy det  Highly consider aquatic therapy for on going back pain - please contact Dr. Jonny Ruiz if you are interested in pursing this  Continue to monitor blood pressure at home  Maintain strict control of hypertension with blood pressure goal below 130/90, diabetes with hemoglobin A1c goal below 6.5% and cholesterol with LDL cholesterol (bad cholesterol) goal below 70 mg/dL. I also advised the patient to eat a healthy diet with plenty of whole grains, cereals, fruits and vegetables, exercise regularly and maintain ideal body weight.  Followup in the future with me as needed or call earlier if needed       Thank you for coming to see [place] at [facility]. I hope we have been able to provide you high quality care today.  You may receive a patient satisfaction survey over the next few weeks. We would appreciate your feedback and comments so that we may continue to improve ourselves and the health of our patients.

## 2018-10-08 NOTE — Progress Notes (Signed)
[facility] [address] [place]. [place] [address] [phone]       OFFICE FOLLOW-UP NOTE  Mr. KAHLEB MCCLANE Date of Birth:  Apr 05, 1929 Medical Record Number:  [id number]   Chief Complaint  Patient presents with  . Follow-up    6 month follow up. Wife present. Rm 9. No new concerns at this time.      HPI:  10/08/18  Mr Balz is being seen today for stroke follow-up visit and is accompanied by his wife.  Overall, he continues to be stable from a neurological standpoint.  He continues on Eliquis and aspirin without recent bleeding or bruising.  Around 04/2018, patient did have episodes of epistaxis and was evaluated by ENT with cauterization.  He denies any recurrent epistaxis since this time.  He continues on Crestor without side effects myalgias.  Blood pressure today 140/83.  Largest complaint today is ongoing back pain which has been present for a few years with prior work-up.  He does use rolling walker to assist with ambulation due to his back pain with occasional "legs giving out" but will use a cane or furniture surf around his house.  Denies any recent falls.  Denies new or worsening stroke/TIA symptoms.   History summary:  Zachory Mangual Klingeis a 85 y.o.malepast medical history of paroxysmal atrial fibrillation not on anticoagulation currently on dual antiplatelets, bradycardia status post pacemaker 2011, chronic kidney disease, hypertension, coronary artery disease, hyperlipidemia, diabetes, prior best report by EMS last seen normal at 3:10 PM today 10/23/2017, was noted by the wife to have witnessed a fall as he was trying to get out of the chair and go to use the bathroom. EMS was called. Upon EMS initial assessment, there was some left-sided weakness that started to improve. They made him walk which presumably worsened his symptoms. En route, he started becoming better with only some dysarthria and left facial droop, which I also found on my exam. No  preceding sickness or illness. No chest pain shortness of breath. No nausea vomiting. No visual symptoms. No headaches. No history of hitting his head on the floor during the fall. LKW:3:10 PM on 10/23/2017 tpa given?: no,low NIH.Premorbid modified Rankin scale (mRS):2 Initial NIH on arrival at the ER bridge-2-1 for dysarthria 1 for facial. Repeat NIH exam probably worse- 2 for dysarthria, may be 1 for the left arm, and 1 left face.His neurological exam worsened with worsening dysarthria and more left upper extremity weakness, endovascular team contacted for endovascular thrombectomy.CT angiogram of the head showed distal right M1 occlusion with moderate atherosclerotic changes involving left P3 segment. C T angio of  neck showed no significant stenosis in the neck. He was taken for emergent mechanical thrombectomy of the occluded right M1 middle cerebral artery and the key 3 revascularization was obtained. Postprocedure CT scan showed possible small subarachnoid hemorrhage. He was admitted to the intensive care unit. He had close neurological monitoring with tight blood pressure control. He made steady improvement.hemoglobin A1c was 7.8.LDL cholesterol was 45 mg percent.follow-up CT scan showed punctate densities in the right insular cortex and subcortical region with a very small infarct. Patient was started on eliquis 2.5 mg twice daily as well as from either on, fenofibrate and Crestor. He was seen by a therapist and recommended to get home therapy.   12/28/2017 visit PS: Patient states his done well since discharge. His able to ambulate independently. He still tracks his left leg a little bit but otherwise has been good physical improvement.  He walks with a cane and sometimes a walker. His stumbles a lot but has had no major falls or injuries. He is tolerating eliquis well without bleeding or bruising. His blood pressure is well controlled and today it is 144/83. He does have some intermittent mild left  hand tremor as well as occasional drooling but denies any significant functional limitation from this. He has finished home physical and occupational therapy.  Interval history: Patient is being seen today for routine follow-up visit and is accompanied by his wife.  Patient is doing well from a stroke standpoint.  He continues to live independently with his wife.  He does use a rolling walker when outside of house but will use cane while inside his house and denies recent falls.  Continues to take Eliquis and aspirin with mild bruising but no bleeding for atrial fibrillation and CAD management which is monitored by cardiology.  Continues to take Crestor and fenofibrate without side effects myalgias.  Blood pressure today satisfactory at 133/79.  Patient does try to remain active at home along with maintaining a healthy diet.  Patient does have continued complaints of left upper extremity tremors intermittently.  He denies these as being debilitating or bothersome.  Denies new or worsening stroke/TIA symptoms.   ROS:   14 system review of systems is positive for walking difficulty and hearing loss and all other systems negative  PMH:  Past Medical History:  Diagnosis Date  . BRADYCARDIA 08/04/2010   s/p PPM since 2011 - generator change 12/03/13  . Chronic kidney disease    followed by nephrology  . Coronary artery disease    remote subendo MI in February 2005 with stents to RCA &LCX; s/p scute thrombosis of both stents in February 2010 with cardiogenic shock - s/p BMS to both lesions; follow up cath in September 2010 was satisfactory.  He is medically managed with aspirin/Plavix  . GERD (gastroesophageal reflux disease)   . High risk medication use    on amiodarone for PAF/VT since February 2010  . HTN (hypertension)   . Hyperlipidemia   . Obesity   . Pacemaker   . PAF (paroxysmal atrial fibrillation) (HCC)    s/p cardioversion in August 2011  . Stroke (HCC)   . Type II or unspecified type  diabetes mellitus without mention of complication, uncontrolled     Social History:  Social History   Socioeconomic History  . Marital status: Married    Spouse name: Not on file  . Number of children: Not on file  . Years of education: Not on file  . Highest education level: Not on file  Occupational History  . Not on file  Social Needs  . Financial resource strain: Not on file  . Food insecurity:    Worry: Not on file    Inability: Not on file  . Transportation needs:    Medical: Not on file    Non-medical: Not on file  Tobacco Use  . Smoking status: Former Smoker    Packs/day: 3.00    Types: Cigarettes  . Smokeless tobacco: Never Used  . Tobacco comment: back in [place]  Substance and Sexual Activity  . Alcohol use: No  . Drug use: No  . Sexual activity: Never  Lifestyle  . Physical activity:    Days per week: Not on file    Minutes per session: Not on file  . Stress: Not on file  Relationships  . Social connections:    Talks on phone:  Not on file    Gets together: Not on file    Attends religious service: Not on file    Active member of club or organization: Not on file    Attends meetings of clubs or organizations: Not on file    Relationship status: Not on file  . Intimate partner violence:    Fear of current or ex partner: Not on file    Emotionally abused: Not on file    Physically abused: Not on file    Forced sexual activity: Not on file  Other Topics Concern  . Not on file  Social History Narrative  . Not on file    Medications:   Current Outpatient Medications on File Prior to Visit  Medication Sig Dispense Refill  . amiodarone (PACERONE) 200 MG tablet TAKE 1/2 TABLET (100mg ) BY MOUTH EVERY DAY (Patient taking differently: Take 100 mg by mouth daily. ) 15 tablet 9  . aspirin 81 MG tablet Take 81 mg by mouth daily.      Marland Kitchen. azelastine (OPTIVAR) 0.05 % ophthalmic solution Place 1 drop into both eyes 2 (two) times daily. 6 mL 12  .  Cholecalciferol (VITAMIN D-3 PO) Take 1 tablet by mouth daily.    Marland Kitchen. ELIQUIS 2.5 MG TABS tablet TAKE 1 TABLET BY MOUTH TWICE A DAY 180 tablet 3  . famotidine (PEPCID) 40 MG tablet TAKE 1 TABLET BY MOUTH EVERY DAY 90 tablet 3  . fenofibrate (TRICOR) 145 MG tablet TAKE 1 TABLET BY MOUTH EVERY DAY 90 tablet 3  . glipiZIDE (GLUCOTROL XL) 2.5 MG 24 hr tablet Take 1 tablet (2.5 mg total) by mouth daily with breakfast. 90 tablet 3  . lidocaine (LIDODERM) 5 % Place 1 patch onto the skin daily. Remove & Discard patch within 12 hours or as directed by MD 60 patch 1  . Multiple Vitamin (MULTIVITAMIN) tablet Take 1 tablet by mouth daily.      . nitroGLYCERIN (NITROSTAT) 0.4 MG SL tablet Place 1 tablet (0.4 mg total) under the tongue every 5 (five) minutes as needed. For chest pain 25 tablet 5  . rosuvastatin (CRESTOR) 20 MG tablet Take 1 tablet (20 mg total) by mouth daily. 90 tablet 3   No current facility-administered medications on file prior to visit.     Allergies:   Allergies  Allergen Reactions  . Penicillins Other (See Comments)    Childhood Allergy Has patient had a PCN reaction causing immediate rash, facial/tongue/throat swelling, SOB or lightheadedness with hypotension: Unknown Has patient had a PCN reaction causing severe rash involving mucus membranes or skin necrosis: Unknown Has patient had a PCN reaction that required hospitalization: Unknown Has patient had a PCN reaction occurring within the last 10 years: No If all of the above answers are "NO", then may proceed with Cephalosporin use.   . Sulfonamide Derivatives Other (See Comments)    Reaction unknown    Physical Exam General: Pleasant elderly Caucasian male seated, in no evident distress Head: head normocephalic and atraumatic.  Neck: supple with no carotid or supraclavicular bruits Cardiovascular: regular rate and rhythm, no murmurs Musculoskeletal: no deformity Skin:  no rash/petichiae Vascular:  Normal pulses all  extremities Vitals:   10/08/18 1049  BP: 140/83  Pulse: 86   Neurologic Exam Mental Status: Awake and fully alert. Oriented to place and time. Recent and remote memory intact. Attention span, concentration and fund of knowledge appropriate. Mood and affect appropriate. Mild slurred speech.decreased facial expression.  Cranial Nerves: Pupils equal, briskly reactive  to light. Extraocular movements full without nystagmus. Visual fields full to confrontation. Hearing is diminished bilaterally.Facial sensation intact. Face, tongue, palate moves normally and symmetrically.  Motor: Normal bulk and tone. Normal strength in all tested extremity muscles. Sensory.: intact to touch ,pinprick .position and vibratory sensation.  Coordination: Rapid alternating movements normal in all extremities. Finger-to-nose and heel-to-shin performed accurately bilaterally. Gait and Station: Arises from chair with some difficulty. Stance is normal.patient is using rolling walker to assist with ambulation and does take short cautious steps. Reflexes: 1+ and symmetric. Toes downgoing.    ASSESSMENT: 85 year old male with right middle cerebral artery infarct and pelvic 2019 secondary to right M1 occlusion treated with mechanical thrombectomy with excellent recannulization. Vascular risk factors of atrial fibrillation, hyperlipidemia and age. He is being seen today for follow-up visit and overall continues to be stable from a stroke standpoint.    PLAN: -Continue aspirin 81 mg daily and Eliquis (apixaban) daily  and Crestor and fenofibrate for secondary stroke prevention -recommended continuation of aspirin 81 mg due to moderate to severe intracranial arthrosclerosis -F/u with PCP regarding your HLD and HTN management -request repeat lipid panel at follow-up appointment to ensure adequate therapy on Crestor and fenofibrate -f/u with cardiologist as scheduled for atrial fibrillation and Eliquis management -continue to  monitor BP at home -Deferred back pain to PCP but did provide patient information regarding aquatic therapy -Advised to continue to stay active and maintain a healthy diet -Maintain strict control of hypertension with blood pressure goal below 130/90, diabetes with hemoglobin A1c goal below 6.5% and cholesterol with LDL cholesterol (bad cholesterol) goal below 70 mg/dL. I also advised the patient to eat a healthy diet with plenty of whole grains, cereals, fruits and vegetables, exercise regularly and maintain ideal body weight.  As patient stable from stroke standpoint, he will follow-up in this office as needed but advised to call with questions, concerns for need of future follow-up appointment   Greater than 50% of time during this 25 minute visit was spent on counseling,explanation of diagnosis, planning of further management, discussion with patient and family and coordination of care  George Hugh, [facility]  [facility] [address] [place], [place] [address]  Phone [phone] Fax [phone] Note: This document was prepared with digital dictation and possible smart phrase technology. Any transcriptional errors that result from this process are unintentional.

## 2018-10-30 DIAGNOSIS — N183 Chronic kidney disease, stage 3 (moderate): Secondary | ICD-10-CM | POA: Diagnosis not present

## 2018-11-09 DIAGNOSIS — I129 Hypertensive chronic kidney disease with stage 1 through stage 4 chronic kidney disease, or unspecified chronic kidney disease: Secondary | ICD-10-CM | POA: Diagnosis not present

## 2018-11-09 DIAGNOSIS — N2581 Secondary hyperparathyroidism of renal origin: Secondary | ICD-10-CM | POA: Diagnosis not present

## 2018-11-09 DIAGNOSIS — D631 Anemia in chronic kidney disease: Secondary | ICD-10-CM | POA: Diagnosis not present

## 2018-11-09 DIAGNOSIS — N183 Chronic kidney disease, stage 3 (moderate): Secondary | ICD-10-CM | POA: Diagnosis not present

## 2018-12-12 ENCOUNTER — Ambulatory Visit (INDEPENDENT_AMBULATORY_CARE_PROVIDER_SITE_OTHER): Payer: PPO | Admitting: *Deleted

## 2018-12-12 ENCOUNTER — Telehealth: Payer: Self-pay

## 2018-12-12 ENCOUNTER — Other Ambulatory Visit: Payer: Self-pay

## 2018-12-12 DIAGNOSIS — I495 Sick sinus syndrome: Secondary | ICD-10-CM

## 2018-12-12 DIAGNOSIS — I480 Paroxysmal atrial fibrillation: Secondary | ICD-10-CM

## 2018-12-12 NOTE — Telephone Encounter (Signed)
Left message for patient to remind of missed remote transmission.  

## 2018-12-13 ENCOUNTER — Telehealth: Payer: Self-pay | Admitting: *Deleted

## 2018-12-13 LAB — CUP PACEART REMOTE DEVICE CHECK
Battery Remaining Longevity: 60 mo
Battery Voltage: 2.78 V
Brady Statistic AP VP Percent: 96.4 %
Brady Statistic AP VS Percent: 0.1 %
Brady Statistic AS VP Percent: 3.5 %
Brady Statistic AS VS Percent: 0.1 %
Brady Statistic RA Percent Paced: 96.5 %
Brady Statistic RV Percent Paced: 100 %
Date Time Interrogation Session: 20200409130809
Implantable Lead Implant Date: 20110531
Implantable Lead Implant Date: 20160622
Implantable Lead Location: 753859
Implantable Lead Location: 753860
Implantable Lead Model: 4470
Implantable Lead Model: 5076
Implantable Lead Serial Number: 677341
Implantable Pulse Generator Implant Date: 20160622
Lead Channel Impedance Value: 380 Ohm
Lead Channel Impedance Value: 446 Ohm
Lead Channel Impedance Value: 446 Ohm
Lead Channel Pacing Threshold Amplitude: 0.875 V
Lead Channel Pacing Threshold Amplitude: 0.875 V
Lead Channel Pacing Threshold Pulse Width: 0.4 ms
Lead Channel Pacing Threshold Pulse Width: 0.4 ms
Lead Channel Setting Pacing Amplitude: 2 V
Lead Channel Setting Pacing Amplitude: 2.5 V
Lead Channel Setting Pacing Pulse Width: 0.4 ms
Lead Channel Setting Sensing Sensitivity: 2 mV

## 2018-12-13 NOTE — Telephone Encounter (Signed)
Virtual Visit Pre-Appointment Phone Call  Steps For Call:  1. Confirm consent - "In the setting of the current Covid19 crisis, you are scheduled for a (phone ) visit with your provider on (April 13) at (1:30 pm).  Just as we do with many in-office visits, in order for you to participate in this visit, we must obtain consent.  If you'd like, I can send this to your mychart (if signed up) or email for you to review.  Otherwise, I can obtain your verbal consent now.  All virtual visits are billed to your insurance company just like a normal visit would be.  By agreeing to a virtual visit, we'd like you to understand that the technology does not allow for your provider to perform an examination, and thus may limit your provider's ability to fully assess your condition.  Finally, though the technology is pretty good, we cannot assure that it will always work on either your or our end, and in the setting of a video visit, we may have to convert it to a phone-only visit.  In either situation, we cannot ensure that we have a secure connection.  Are you willing to proceed?"  2. Give patient instructions for WebEx download to smartphone as below if video visit  3. Advise patient to be prepared with any vital sign or heart rhythm information, their current medicines, and a piece of paper and pen handy for any instructions they may receive the day of their visit  4. Inform patient they will receive a phone call 15 minutes prior to their appointment time (may be from unknown caller ID) so they should be prepared to answer  5. Confirm that appointment type is correct in Epic appointment notes (video vs telephone)    TELEPHONE CALL NOTE  Kent GammaWilliam P Partch has been deemed a candidate for a follow-up tele-health visit to limit community exposure during the Covid-19 pandemic. I spoke with the patient via phone to ensure availability of phone/video source, confirm preferred email & phone number, and discuss  instructions and expectations.  I reminded Kent Nolan to be prepared with any vital sign and/or heart rhythm information that could potentially be obtained via home monitoring, at the time of his visit. I reminded Kent Nolan to expect a phone call at the time of his visit if his visit.  Did the patient verbally acknowledge consent to treatment? Yes, Wife per (DPR). April 13 @ 1:30 pm.  Kent PomfretDanielle Robin Nolan 12/13/2018 9:24 AM   DOWNLOADING THE WEBEX SOFTWARE TO SMARTPHONE  - If Apple, go to [employer] Store and type in WebEx in the search bar. Download [employer] Meetings, the blue/green circle. The app is free but as with any other app downloads, their phone may require them to verify saved payment information or Apple password. The patient does NOT have to create an account.  - If Android, ask patient to go to [employer] Play Store and type in WebEx in the search bar. Download [employer] Meetings, the blue/green circle. The app is free but as with any other app downloads, their phone may require them to verify saved payment information or Android password. The patient does NOT have to create an account.   CONSENT FOR TELE-HEALTH VISIT - PLEASE REVIEW  I hereby voluntarily request, consent and authorize [facility] and its employed or contracted physicians, Producer, television/film/videophysician assistants, nurse practitioners or other licensed health care professionals Norma Fredrickson(Lori Gerhardt, NP), to provide me with telemedicine health care services (  Phone) as deemed necessary by the treating Practitioner. I acknowledge and consent to receive the Services by the Practitioner via telemedicine. I understand that the telemedicine visit will involve communicating with the Practitioner through live audiovisual communication technology and the disclosure of certain medical information by electronic transmission. I acknowledge that I have been given the opportunity to request an in-person assessment or other available alternative  prior to the telemedicine visit and am voluntarily participating in the telemedicine visit.  I understand that I have the right to withhold or withdraw my consent to the use of telemedicine in the course of my care at any time, without affecting my right to future care or treatment, and that the Practitioner or I may terminate the telemedicine visit at any time. I understand that I have the right to inspect all information obtained and/or recorded in the course of the telemedicine visit and may receive copies of available information for a reasonable fee.  I understand that some of the potential risks of receiving the Services via telemedicine include:  Marland Kitchen Delay or interruption in medical evaluation due to technological equipment failure or disruption; . Information transmitted may not be sufficient (e.g. poor resolution of images) to allow for appropriate medical decision making by the Practitioner; and/or  . In rare instances, security protocols could fail, causing a breach of personal health information.  Furthermore, I acknowledge that it is my responsibility to provide information about my medical history, conditions and care that is complete and accurate to the best of my ability. I acknowledge that Practitioner's advice, recommendations, and/or decision may be based on factors not within their control, such as incomplete or inaccurate data provided by me or distortions of diagnostic images or specimens that may result from electronic transmissions. I understand that the practice of medicine is not an exact science and that Practitioner makes no warranties or guarantees regarding treatment outcomes. I acknowledge that I will receive a copy of this consent concurrently upon execution via email to the email address I last provided but may also request a printed copy by calling the office of [facility].    I understand that my insurance will be billed for this visit.   I have read or had this  consent read to me. . I understand the contents of this consent, which adequately explains the benefits and risks of the Services being provided via telemedicine.  . I have been provided ample opportunity to ask questions regarding this consent and the Services and have had my questions answered to my satisfaction. . I give my informed consent for the services to be provided through the use of telemedicine in my medical care  By participating in this telemedicine visit I agree to the above.

## 2018-12-15 ENCOUNTER — Other Ambulatory Visit: Payer: Self-pay | Admitting: Internal Medicine

## 2018-12-16 NOTE — Progress Notes (Signed)
Telehealth Visit     Virtual Visit via Telephone Note   This visit type was conducted due to national recommendations for restrictions regarding the COVID-19 Pandemic (e.g. social distancing) in an effort to limit this patient's exposure and mitigate transmission in our community.  Due to his co-morbid illnesses, this patient is at least at moderate risk for complications without adequate follow up.  This format is felt to be most appropriate for this patient at this time.  The patient did not have access to video technology/had technical difficulties with video requiring transitioning to audio format only (telephone).  All issues noted in this document were discussed and addressed.  No physical exam could be performed with this format.  Please refer to the patient's chart for his  consent to telehealth for [facility].   Evaluation Performed:  Follow-up visit  This visit type was conducted due to national recommendations for restrictions regarding the COVID-19 Pandemic (e.g. social distancing).  This format is felt to be most appropriate for this patient at this time.  All issues noted in this document were discussed and addressed.  No physical exam was performed (except for noted visual exam findings with Video Visits).  Please refer to the patient's chart (MyChart message for video visits and phone note for telephone visits) for the patient's consent to telehealth for [facility]  Date:  12/17/2018   ID:  Kent Nolan, DOB May 23, 1929, MRN [id number]  Patient Location:  Home  Provider location:   Home  PCP:  Corwin Levins, MD  Cardiologist:  Tyrone Sage Allred Electrophysiologist:  Allred   Chief Complaint:  8 month follow up visit.   History of Present Illness:    Kent Nolan is a 85 y.o. male who presents via audio/video conferencing for a telehealth visit today.  Seen for [name]. Former patient of [name].   He has known ischemic heart disease with prior  subendocardial MI in February of 2005 with stents to the RCA and LCX. He experienced acute thrombosis of both stents in February of 2010 that was associated with cardiogenic shock and had repeat stenting to both lesions at that time with non drug eluting stents placed. Follow up cath in September of 2010 was satisfactory. He is on chronic Plavix.   Other problems include DM, HTN, GERD, ACE related cough, HLD, PAF and VT back in 2010 and on chronic amiodarone therapy, CRI and remote UTIs. He does have an underlying pacemaker in place since 2011 and has had prior cardioversion for his atrial fib back in August of 2011. While he does have an elevated CHADS score, he has been maintained on chronic Plavix and aspirin therapy in light of his stent thrombosis - he  declined anticoagulation with coumadin/NOVAC in the past - has had no recurrent atrial fib. EF by echo from 2010 was 50 to 55%. Last generator change was in March of 2015.   I saw him back in February of 2015 - he was dizzy. Had "fallen out" several times since he was here - no significant injury yet. We did extensive testing at that time which included Myoview, echo, CT etc. Felt to have some degree of orthostasis. Has also had his generator replaced at the end of March 2015 per [name].   Seen by me in [facility] 2015. Still dizzy. Pretty limited from back pain - had been to see his PCP and was given Vicodin, muscle relaxer and prednisone. Then saw [name] back in  July as well. Device check ok. He was quite orthostatic at thatvisit here - almost passed out while weighing. Was using narcotics and felt to beoversedated as well. Lasix was stopped. Again, did not wish to change to coumadin/NOAC but preferredto stay on his Plavix and aspirin therapy.  I have seen him several times since - mostly limited by his backpain. He has had his atrial lead revisedon his pacemaker. Dose of amiodaronehas been able to becut back.Had a prior syncopal spell  in 2017 - he did not seek attention.   He had a fall back in February of 2019 - noted to have left sided weakness - diagnosed with embolic stroke. Was taken off Plavix and placed on low dose Eliquis/aspirin per neurology. Remains on amiodarone. He underwent endovascular thrombectomy. Cerebral infarction due to right M1 occlusion due to AF - post CT of the head with possible small amount of postprocedure ICH. Procedure was complicated by right groin wound.  At follow up with me in April of 2019 - he was really declining. Has had recurrent ER visit for nose bleeding. Last seen by me in August - pretty sedentary and was ready to die and content. Saw Dr. Johney FrameAllred in October for his device check.   The patient does not have symptoms concerning for COVID-19 infection (fever, chills, cough, or new shortness of breath).   Seen today via phone conversation. He has verbalized consent for this conversation. They do not have Internet/computer or smart phone technology. Wife Eber Jones([name]) augments the conversation and the conversation was mostly with her. She notes that Mr. Kent BailiffKlinge is doing ok for the most part - he is getting out less - thus moving less and is more sedentary. No bleeding. Has stopped high dose aspirin (used to take for his chronic back pain) but still on 81 mg along with low dose Eliquis. No bleeding. No excessive bruising. No recent falls. No chest pain. Breathing is stable. Tolerating medicines. Needs amiodarone refilled. Does not sound like they see Nephrology anymore.  Past Medical History:  Diagnosis Date  . BRADYCARDIA 08/04/2010   s/p PPM since 2011 - generator change 12/03/13  . Chronic kidney disease    followed by nephrology  . Coronary artery disease    remote subendo MI in February 2005 with stents to RCA &LCX; s/p scute thrombosis of both stents in February 2010 with cardiogenic shock - s/p BMS to both lesions; follow up cath in September 2010 was satisfactory.  He is medically managed  with aspirin/Plavix  . GERD (gastroesophageal reflux disease)   . High risk medication use    on amiodarone for PAF/VT since February 2010  . HTN (hypertension)   . Hyperlipidemia   . Obesity   . Pacemaker   . PAF (paroxysmal atrial fibrillation) (HCC)    s/p cardioversion in August 2011  . Stroke (HCC)   . Type II or unspecified type diabetes mellitus without mention of complication, uncontrolled    Past Surgical History:  Procedure Laterality Date  . EP IMPLANTABLE DEVICE N/A 02/25/2015   Procedure: Lead Revision/Repair;  Surgeon: Hillis RangeJames Allred, MD;  Location: [facility] INVASIVE CV LAB;  Service: Cardiovascular;  Laterality: N/A;  . IR CT HEAD LTD  10/23/2017  . IR PERCUTANEOUS ART THROMBECTOMY/INFUSION INTRACRANIAL INC DIAG ANGIO  10/23/2017  . PACEMAKER GENERATOR CHANGE  12/03/13   MDT Adapta L generator change for premature ERI by [name]  . PACEMAKER INSERTION  May 2011  . PERMANENT PACEMAKER GENERATOR CHANGE N/A 12/03/2013  Procedure: PERMANENT PACEMAKER GENERATOR CHANGE;  Surgeon: [name], MD;  Location: [facility] CATH LAB;  Service: Cardiovascular;  Laterality: N/A;  . RADIOLOGY WITH ANESTHESIA N/A 10/23/2017   Procedure: RADIOLOGY WITH ANESTHESIA;  Surgeon: [name], MD;  Location: [facility] OR;  Service: Radiology;  Laterality: N/A;     Current Meds  Medication Sig  . amiodarone (PACERONE) 200 MG tablet TAKE 1/2 TABLET BY MOUTH EVERY DAY  . aspirin EC 81 MG tablet Take 81 mg by mouth daily.  Marland Kitchen azelastine (OPTIVAR) 0.05 % ophthalmic solution Place 1 drop into both eyes 2 (two) times daily.  . Cholecalciferol (VITAMIN D-3 PO) Take 1 tablet by mouth daily.  Marland Kitchen ELIQUIS 2.5 MG TABS tablet TAKE 1 TABLET BY MOUTH TWICE A DAY  . famotidine (PEPCID) 40 MG tablet TAKE 1 TABLET BY MOUTH EVERY DAY  . fenofibrate (TRICOR) 145 MG tablet TAKE 1 TABLET BY MOUTH EVERY DAY  . glipiZIDE (GLUCOTROL XL) 2.5 MG 24 hr tablet Take 1 tablet (2.5 mg total) by mouth daily with breakfast.  . lidocaine  (LIDODERM) 5 % Place 1 patch onto the skin daily. Remove & Discard patch within 12 hours or as directed by MD  . Multiple Vitamin (MULTIVITAMIN) tablet Take 1 tablet by mouth daily.    . nitroGLYCERIN (NITROSTAT) 0.4 MG SL tablet Place 1 tablet (0.4 mg total) under the tongue every 5 (five) minutes as needed. For chest pain  . rosuvastatin (CRESTOR) 20 MG tablet Take 1 tablet (20 mg total) by mouth daily.  . [DISCONTINUED] amiodarone (PACERONE) 200 MG tablet TAKE 1/2 TABLET BY MOUTH EVERY DAY  . [DISCONTINUED] aspirin 81 MG tablet Take 81 mg by mouth daily.       Allergies:   Penicillins and Sulfonamide derivatives   Social History   Tobacco Use  . Smoking status: Former Smoker    Packs/day: 3.00    Types: Cigarettes  . Smokeless tobacco: Never Used  . Tobacco comment[place]  Substance Use Topics  . Alcohol use: No  . Drug use: No     Family Hx: The patient's family history includes Diabetes in his father; Heart disease in his mother; Stomach cancer in his father.  ROS:   Please see the history of present illness.   All other systems reviewed are negative except for none.    Objective:    Vital Signs:  BP 128/78   Wt 179 lb (81.2 kg)   BMI 28.04 kg/m    Wt Readings from Last 3 Encounters:  12/17/18 179 lb (81.2 kg)  10/08/18 187 lb 6.4 oz (85 kg)  08/14/18 180 lb (81.6 kg)    I could hear him in the background - no acute distress noted. Hard of hearing.    Labs/Other Tests and Data Reviewed:    Lab Results  Component Value Date   WBC 6.5 06/13/2018   HGB 14.2 06/13/2018   HCT 42.2 06/13/2018   PLT 187 06/13/2018   GLUCOSE 193 (H) 06/13/2018   CHOL 77 10/24/2017   TRIG 74 10/24/2017   HDL 30 (L) 10/24/2017   LDLDIRECT 45.4 07/23/2012   LDLCALC 32 10/24/2017   ALT 11 06/13/2018   AST 15 06/13/2018   NA 139 06/13/2018   K 4.1 06/13/2018   CL 101 06/13/2018   CREATININE 1.76 (H) 06/13/2018   BUN 26 06/13/2018   CO2 22 06/13/2018   TSH  3.920 06/13/2018   INR 1.04 10/23/2017   HGBA1C 7.9 (A) 08/14/2018  MICROALBUR 1.7 07/22/2015        BNP (last 3 results) No results for input(s): BNP in the last 8760 hours.  ProBNP (last 3 results) No results for input(s): PROBNP in the last 8760 hours.    Prior CV studies:    The following studies were reviewed today:  EchoStudy Conclusions2/2019  - Left ventricle: The cavity size was normal. Wall thickness was normal. Systolic function was mildly to moderately reduced. The estimated ejection fraction was in the range of 40% to 45%. Diffuse hypokinesis. There is akinesis of the inferior and inferoseptal myocardium. Doppler parameters are consistent with abnormal left ventricular relaxation (grade 1 diastolic dysfunction). - Mitral valve: There was mild regurgitation. - Tricuspid valve: There was mild regurgitation.  Impressions:  - No cardiac source of emboli was indentified.   Myoview Overall Impression from 10/2013:  Intermediate risk stress nuclear study with  large (35% of myocardium) mostly fixed inferolateral defect  suggesting of LCx territory scar. No significant reversible  ischemia.  LV Wall Motion: LVEF 47%, with inferolateral akinesis  Chrystie Nose, MD, [facility] Board Certified in Nuclear Cardiology Attending Cardiologist [facility]   ASSESSMENT & PLAN:    1.Prior stroke - he remains on aspirin/Eliquis. We have previously given the ok to stop his aspirin - he has been on both by neurology - no longer taking his high dose aspirin - just 81 mg - no bleeding noted.   2. Failure to thrive - has told me previously that he is ready to die - not really discussed today - sounds like he has gotten more sedentary with the shelter in place orders.   3. Underlying SSS -pacemaker in place - prior atrial lead revisionfrom 2016.Followed by [name]- last check in October noted that he had 2nd degree AV block and was pacing  more. He was programmed DDD at that visit as MVP was no longer working  4. PAF -see #3. Balance remains an issue. Has stopped his high dose aspirin.   5. CAD-No ischemic symptoms reported - would favor conservative management.    6. HLD - on statintherapy   7. High risk therapy -not able at this time to obtain lab in light of COVID 19 issues. Will plan on checking lab in August.   8. LV dysfunction -last echo noted - would favor conservative management. He does not sound to have any active symptoms of heart failure.  9. Prior episode of epistaxis - resolved with cautery- has not recurred. High dose aspirin has been discontinued.   10. COVID-19 Education: The signs and symptoms of COVID-19 were discussed with the patient and how to seek care for testing (follow up with PCP or arrange E-visit).  The importance of social distancing, staying at home and hand hygiene were discussed today.  Patient Risk:   After full review of this patient's clinical status, I feel that they are at least moderate risk at this time.  Time:   Today, I have spent 15 minutes with the patient with telehealth technology discussing the above issues.     Medication Adjustments/Labs and Tests Ordered: Current medicines are reviewed at length with the patient today.  Concerns regarding medicines are outlined above.   Tests Ordered: Orders Placed This Encounter  Procedures  . Basic metabolic panel  . CBC  . Hepatic function panel  . Lipid panel  . TSH    Medication Changes: Meds ordered this encounter  Medications  . amiodarone (PACERONE) 200 MG tablet  Sig: TAKE 1/2 TABLET BY MOUTH EVERY DAY    Dispense:  45 tablet    Refill:  2    Order Specific Question:   Supervising Provider    Answer:   [place], PETER M [[id number]]    Disposition:  FU with me in 6 months. Will get labs in August of 2020.     Patient is agreeable to this plan and will call if any problems develop in the interim.    Avelina Laine, NP  12/17/2018 1:49 PM    [facility]

## 2018-12-17 ENCOUNTER — Telehealth (INDEPENDENT_AMBULATORY_CARE_PROVIDER_SITE_OTHER): Payer: PPO | Admitting: Nurse Practitioner

## 2018-12-17 ENCOUNTER — Encounter: Payer: Self-pay | Admitting: Nurse Practitioner

## 2018-12-17 ENCOUNTER — Other Ambulatory Visit: Payer: Self-pay

## 2018-12-17 VITALS — BP 128/78 | Wt 179.0 lb

## 2018-12-17 DIAGNOSIS — Z789 Other specified health status: Secondary | ICD-10-CM

## 2018-12-17 DIAGNOSIS — R627 Adult failure to thrive: Secondary | ICD-10-CM

## 2018-12-17 DIAGNOSIS — Z79899 Other long term (current) drug therapy: Secondary | ICD-10-CM

## 2018-12-17 DIAGNOSIS — Z7189 Other specified counseling: Secondary | ICD-10-CM

## 2018-12-17 DIAGNOSIS — Z950 Presence of cardiac pacemaker: Secondary | ICD-10-CM | POA: Diagnosis not present

## 2018-12-17 DIAGNOSIS — I480 Paroxysmal atrial fibrillation: Secondary | ICD-10-CM | POA: Diagnosis not present

## 2018-12-17 DIAGNOSIS — I10 Essential (primary) hypertension: Secondary | ICD-10-CM | POA: Diagnosis not present

## 2018-12-17 DIAGNOSIS — Z8673 Personal history of transient ischemic attack (TIA), and cerebral infarction without residual deficits: Secondary | ICD-10-CM

## 2018-12-17 DIAGNOSIS — E785 Hyperlipidemia, unspecified: Secondary | ICD-10-CM

## 2018-12-17 DIAGNOSIS — I2510 Atherosclerotic heart disease of native coronary artery without angina pectoris: Secondary | ICD-10-CM | POA: Diagnosis not present

## 2018-12-17 MED ORDER — AMIODARONE HCL 200 MG PO TABS: ORAL_TABLET | ORAL | 2 refills | Status: DC

## 2018-12-17 NOTE — Patient Instructions (Addendum)
After Visit Summary:  We will be checking the following labs today - NONE  We will do fasting labs in August - BMET, CBC, HPF, Lipids and TSH in follow up of his medicines.    Medication Instructions:    Continue with your current medicines.   I have sent in your refill for your amiodarone today..    If you need a refill on your cardiac medications before your next appointment, please call your pharmacy.     Testing/Procedures To Be Arranged:  N/A  Follow-Up:   See me in 6 months.     At [facility], you and your health needs are our priority.  As part of our continuing mission to provide you with exceptional heart care, we have created designated Provider Care Teams.  These Care Teams include your primary Cardiologist (physician) and Advanced Practice Providers (APPs -  Physician Assistants and Nurse Practitioners) who all work together to provide you with the care you need, when you need it.  Special Instructions:  . Stay safe, stay home and wash your hands for at least 20 seconds! . It was good to talk with you today! . Let me know if you need anything.   Call the [facility] office at [phone] if you have any questions, problems or concerns.

## 2018-12-18 ENCOUNTER — Ambulatory Visit: Payer: Medicare HMO | Admitting: Nurse Practitioner

## 2018-12-21 NOTE — Progress Notes (Signed)
Remote pacemaker transmission.   

## 2019-02-12 ENCOUNTER — Emergency Department (HOSPITAL_COMMUNITY): Payer: PPO

## 2019-02-12 ENCOUNTER — Emergency Department (HOSPITAL_COMMUNITY)
Admission: EM | Admit: 2019-02-12 | Discharge: 2019-02-12 | Disposition: A | Discharge status: Home / Self Care | Payer: PPO | Attending: Emergency Medicine | Admitting: Emergency Medicine

## 2019-02-12 ENCOUNTER — Other Ambulatory Visit: Payer: Self-pay

## 2019-02-12 ENCOUNTER — Encounter (HOSPITAL_COMMUNITY): Payer: Self-pay | Admitting: *Deleted

## 2019-02-12 DIAGNOSIS — W19XXXA Unspecified fall, initial encounter: Secondary | ICD-10-CM | POA: Diagnosis not present

## 2019-02-12 DIAGNOSIS — Y939 Activity, unspecified: Secondary | ICD-10-CM | POA: Insufficient documentation

## 2019-02-12 DIAGNOSIS — Z79899 Other long term (current) drug therapy: Secondary | ICD-10-CM | POA: Diagnosis not present

## 2019-02-12 DIAGNOSIS — M79622 Pain in left upper arm: Secondary | ICD-10-CM | POA: Diagnosis present

## 2019-02-12 DIAGNOSIS — S0990XA Unspecified injury of head, initial encounter: Secondary | ICD-10-CM | POA: Diagnosis not present

## 2019-02-12 DIAGNOSIS — S42212A Unspecified displaced fracture of surgical neck of left humerus, initial encounter for closed fracture: Secondary | ICD-10-CM | POA: Insufficient documentation

## 2019-02-12 DIAGNOSIS — W1830XA Fall on same level, unspecified, initial encounter: Secondary | ICD-10-CM | POA: Insufficient documentation

## 2019-02-12 DIAGNOSIS — I129 Hypertensive chronic kidney disease with stage 1 through stage 4 chronic kidney disease, or unspecified chronic kidney disease: Secondary | ICD-10-CM | POA: Insufficient documentation

## 2019-02-12 DIAGNOSIS — S42202A Unspecified fracture of upper end of left humerus, initial encounter for closed fracture: Secondary | ICD-10-CM | POA: Diagnosis not present

## 2019-02-12 DIAGNOSIS — Z7982 Long term (current) use of aspirin: Secondary | ICD-10-CM | POA: Insufficient documentation

## 2019-02-12 DIAGNOSIS — E1122 Type 2 diabetes mellitus with diabetic chronic kidney disease: Secondary | ICD-10-CM | POA: Insufficient documentation

## 2019-02-12 DIAGNOSIS — Y92019 Unspecified place in single-family (private) house as the place of occurrence of the external cause: Secondary | ICD-10-CM | POA: Diagnosis not present

## 2019-02-12 DIAGNOSIS — Z7984 Long term (current) use of oral hypoglycemic drugs: Secondary | ICD-10-CM | POA: Insufficient documentation

## 2019-02-12 DIAGNOSIS — Y999 Unspecified external cause status: Secondary | ICD-10-CM | POA: Diagnosis not present

## 2019-02-12 DIAGNOSIS — I480 Paroxysmal atrial fibrillation: Secondary | ICD-10-CM | POA: Diagnosis not present

## 2019-02-12 DIAGNOSIS — Z950 Presence of cardiac pacemaker: Secondary | ICD-10-CM | POA: Diagnosis not present

## 2019-02-12 DIAGNOSIS — R0902 Hypoxemia: Secondary | ICD-10-CM | POA: Diagnosis not present

## 2019-02-12 DIAGNOSIS — I252 Old myocardial infarction: Secondary | ICD-10-CM | POA: Insufficient documentation

## 2019-02-12 DIAGNOSIS — N189 Chronic kidney disease, unspecified: Secondary | ICD-10-CM | POA: Diagnosis not present

## 2019-02-12 DIAGNOSIS — R52 Pain, unspecified: Secondary | ICD-10-CM | POA: Diagnosis not present

## 2019-02-12 MED ORDER — TRAMADOL HCL 50 MG PO TABS: 25.0000 mg | ORAL_TABLET | Freq: Four times a day (QID) | ORAL | 0 refills | Status: DC | PRN

## 2019-02-12 NOTE — TOC Transition Note (Signed)
Transition of Care [facility]) - CM/SW Discharge Note   Patient Details  Name: Kent Nolan MRN: [id number] Date of Birth: 14-Jun-1929  Transition of Care [facility] CM/SW Contact:  Fuller Mandril, RN Phone Number: 02/12/2019, 12:02 PM   Clinical Narrative:    [facility] consulted for home health set-up.   Final next level of care: [place] Barriers to Discharge: Barriers Resolved   Patient Goals and CMS Choice Patient states their goals for this hospitalization and ongoing recovery are:: get some help at home CMS Medicare.gov Compare Post Acute Care list provided to:: Patient Choice offered to / list presented to : Patient  Discharge Placement                [name], RN, BSN, [employer] [phone] Spoke with pt at bedside regarding discharge planning for [facility]. Offered pt list of home health agencies to choose from.  Pt chose [facility] to render services. [name] of [facility] notified. Patient made aware that [facility] will be in contact in 24-48 hours.  No DME needs identified at this time.         Discharge Plan and Services   Discharge Planning Services: CM Consult Post Acute Care Choice: Home Health                    HH Arranged: PT, OT [facility] Agency: [place] Date [place]: 02/12/19 Time [address]: [id number] Representative spoke with at [place]: [place] Determinants of Health ([place]) Interventions     Readmission Risk Interventions No flowsheet data found.

## 2019-02-12 NOTE — ED Notes (Signed)
ED Provider at bedside. 

## 2019-02-12 NOTE — Discharge Instructions (Signed)
Please take tylenol for pain. If you have severe pain you may take the stronger medication, Tramadol, that I have prescribed. Please make an appointment with Dr. Stann Mainland for a close follow up appointment, or follow up with another orthopedic doctor of your choice.  Contact a health care provider if: You have any new pain, swelling, or bruising. Your pain, swelling, and bruising do not improve. Your cast, splint, or sling becomes loose or damaged. Get help right away if: Your skin or fingers on your injured arm turn blue or gray. Your arm feels cold or numb. You have severe pain in your injured arm.

## 2019-02-12 NOTE — ED Triage Notes (Signed)
Pt arrived by ptar from home. Pt tripped on dog this am and fell, landed on left side and now has left upper arm pain. Pt is on eliquis, denies hitting head or loc.

## 2019-02-12 NOTE — ED Provider Notes (Signed)
[facility] Provider Note   CSN: [id number] Arrival date & time: 02/12/19  [id number]    History   Chief Complaint Chief Complaint  Patient presents with  . Fall    HPI Kent Nolan is a 85 y.o. male       Fall  This is a recurrent problem. The current episode started less than 1 hour ago. The problem has not changed since onset.Pertinent negatives include no chest pain, no abdominal pain, no headaches and no shortness of breath. He has tried nothing for the symptoms.   85 year old male presents via EMS for mechanical fall that occurred approximately 45 minutes ago.  The patient tripped over his dog at home falling onto his left side.  He denies hitting his head or losing consciousness.  He complains of pain in his left upper extremity.  The pain is mild at rest, worse with palpation or any movement.  He denies numbness or tingling.  He denies any other complaints at this time.  Past Medical History:  Diagnosis Date  . BRADYCARDIA 08/04/2010   s/p PPM since 2011 - generator change 12/03/13  . Chronic kidney disease    followed by nephrology  . Coronary artery disease    remote subendo MI in February 2005 with stents to RCA &LCX; s/p scute thrombosis of both stents in February 2010 with cardiogenic shock - s/p BMS to both lesions; follow up cath in September 2010 was satisfactory.  He is medically managed with aspirin/Plavix  . GERD (gastroesophageal reflux disease)   . High risk medication use    on amiodarone for PAF/VT since February 2010  . HTN (hypertension)   . Hyperlipidemia   . Obesity   . Pacemaker   . PAF (paroxysmal atrial fibrillation) (HCC)    s/p cardioversion in August 2011  . Stroke (HCC)   . Type II or unspecified type diabetes mellitus without mention of complication, uncontrolled     Patient Active Problem List   Diagnosis Date Noted  . Allergic conjunctivitis 08/14/2018  . Gait disturbance, post-stroke   .  Cognitive deficit, post-stroke   . Stroke (cerebrum) (HCC) 10/23/2017  . 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 Lower back pain 03/30/2014  . Preventative health care 11/26/2013  . Diabetes (HCC) 11/26/2013  . Coronary artery disease   . PAF (paroxysmal atrial fibrillation) (HCC)   . GERD (gastroesophageal reflux disease)   . Hyperlipidemia   . Chronic kidney disease   . Pacemaker   . Essential hypertension, benign 08/04/2010  . Sick sinus syndrome (HCC) 08/04/2010    Past Surgical History:  Procedure Laterality Date  . EP IMPLANTABLE DEVICE N/A 02/25/2015   Procedure: Lead Revision/Repair;  Surgeon: Hillis RangeJames Allred, MD;  Location: [facility] INVASIVE CV LAB;  Service: Cardiovascular;  Laterality: N/A;  . IR CT HEAD LTD  10/23/2017  . IR PERCUTANEOUS ART THROMBECTOMY/INFUSION INTRACRANIAL INC DIAG ANGIO  10/23/2017  . PACEMAKER GENERATOR CHANGE  12/03/13   MDT Adapta L generator change for premature ERI by [name]  . PACEMAKER INSERTION  May 2011  . PERMANENT PACEMAKER GENERATOR CHANGE N/A 12/03/2013   Procedure: PERMANENT PACEMAKER GENERATOR CHANGE;  Surgeon: Gardiner RhymeJames D Allred, MD;  Location: [facility] CATH LAB;  Service: Cardiovascular;  Laterality: N/A;  . RADIOLOGY WITH ANESTHESIA N/A 10/23/2017   Procedure: RADIOLOGY WITH ANESTHESIA;  Surgeon: Julieanne Cottoneveshwar, Sanjeev, MD;  Location: [facility] OR;  Service: Radiology;  Laterality: N/A;  Home Medications    Prior to Admission medications   Medication Sig Start Date End Date Taking? Authorizing Provider  amiodarone (PACERONE) 200 MG tablet TAKE 1/2 TABLET BY MOUTH EVERY DAY 12/17/18   Rosalio MacadamiaGerhardt, Lori C, NP  aspirin EC 81 MG tablet Take 81 mg by mouth daily.    [provider]  azelastine (OPTIVAR) 0.05 % ophthalmic solution Place 1 drop into both eyes 2 (two) times daily. 08/14/18   Corwin LevinsJohn, James W, MD  Cholecalciferol (VITAMIN D-3 PO) Take 1 tablet by mouth daily.    [provider]   ELIQUIS 2.5 MG TABS tablet TAKE 1 TABLET BY MOUTH TWICE A DAY 06/19/18   Allred, Fayrene FearingJames, MD  famotidine (PEPCID) 40 MG tablet TAKE 1 TABLET BY MOUTH EVERY DAY 06/28/18   Allred, Fayrene FearingJames, MD  fenofibrate (TRICOR) 145 MG tablet TAKE 1 TABLET BY MOUTH EVERY DAY 05/11/18   Allred, Fayrene FearingJames, MD  glipiZIDE (GLUCOTROL XL) 2.5 MG 24 hr tablet Take 1 tablet (2.5 mg total) by mouth daily with breakfast. 08/14/18   Corwin LevinsJohn, James W, MD  lidocaine (LIDODERM) 5 % Place 1 patch onto the skin daily. Remove & Discard patch within 12 hours or as directed by MD 04/11/18   Corwin LevinsJohn, James W, MD  Multiple Vitamin (MULTIVITAMIN) tablet Take 1 tablet by mouth daily.      [provider]  nitroGLYCERIN (NITROSTAT) 0.4 MG SL tablet Place 1 tablet (0.4 mg total) under the tongue every 5 (five) minutes as needed. For chest pain 02/27/15   Allred, Fayrene FearingJames, MD  rosuvastatin (CRESTOR) 20 MG tablet Take 1 tablet (20 mg total) by mouth daily. 07/18/18   Hillis RangeAllred, James, MD    Family History Family History  Problem Relation Age of Onset  . Heart disease Mother   . Stomach cancer Father   . Diabetes Father     Social History Social History   Tobacco Use  . Smoking status: Former Smoker    Packs/day: 3.00    Types: Cigarettes  . Smokeless tobacco: Never Used  . [place]  Substance Use Topics  . Alcohol use: No  . Drug use: No     Allergies   Penicillins and Sulfonamide derivatives   Review of Systems Review of Systems  Constitutional: Negative.   HENT: Negative.   Eyes: Negative for visual disturbance.  Respiratory: Negative.  Negative for shortness of breath.   Cardiovascular: Negative for chest pain.  Gastrointestinal: Negative for abdominal pain.  Musculoskeletal: Positive for arthralgias.       LUE pain   Skin: Negative.   Neurological: Negative for dizziness, syncope and headaches.  Psychiatric/Behavioral: Negative.      Physical Exam Updated Vital Signs BP (!) 166/80    Pulse 66   Temp 98 F (36.7 C) (Oral)   Resp 20   SpO2 97%   Physical Exam Vitals signs and nursing note reviewed.  Constitutional:      General: He is not in acute distress.    Appearance: He is well-developed. He is not diaphoretic.  HENT:     Head: Normocephalic and atraumatic.  Eyes:     General: No scleral icterus.    Conjunctiva/sclera: Conjunctivae normal.  Neck:     Musculoskeletal: Normal range of motion and neck supple.  Cardiovascular:     Rate and Rhythm: Normal rate and regular rhythm.     Heart sounds: Normal heart sounds.  Pulmonary:     Effort: Pulmonary effort is normal. No  respiratory distress.     Breath sounds: Normal breath sounds.  Abdominal:     Palpations: Abdomen is soft.     Tenderness: There is no abdominal tenderness.  Musculoskeletal:        General: Tenderness present.     Left upper arm: He exhibits tenderness and deformity. He exhibits no edema.  Skin:    General: Skin is warm and dry.  Neurological:     Mental Status: He is alert.     Comments: Resting tremor BL  Psychiatric:        Behavior: Behavior normal.      ED Treatments / Results  Labs (all labs ordered are listed, but only abnormal results are displayed) Labs Reviewed - No data to display  EKG None  Radiology No results found.  Procedures Procedures (including critical care time)  Medications Ordered in ED Medications - No data to display   Initial Impression / Assessment and Plan / ED Course  I have reviewed the triage vital signs and the nursing notes.  Pertinent labs & imaging results that were available during my care of the patient were reviewed by me and considered in my medical decision making (see chart for details).  Clinical Course as of Feb 12 1132  Tue Feb 12, 2019  1102 CT Head Wo Contrast [AH]  1103 CT Cervical Spine Wo Contrast [AH]  1103 I personally reviewed the left humerus xray which shows a new, displaced left proximal humerus fracture   DG Humerus Left [AH]    Clinical Course User Index [AH] Margarita Mail, PA-C       Patient with left proximal humerus fracture.  He was placed in a sling immobilizer.  Patient lives at home with his wife who states that she is able to help him.  He ambulates with a cane and a walker.  She feels like she can help him with ambulation and will keep a close eye.  I have also ordered PT OT home health for assistance with safe ambulation.  Patient is advised to follow outpatient with the orthopedist.  I have ordered low-dose tramadol for pain relief.  Patient otherwise appears appropriate for discharge at this time.  Seen and shared visit with Dr. Venora Maples.  Final Clinical Impressions(s) / ED Diagnoses   Final diagnoses:  None    ED Discharge Orders    None       Margarita Mail, PA-C 02/13/19 1548    Jola Schmidt, MD 02/15/19 1339

## 2019-02-12 NOTE — ED Notes (Signed)
This RN acting as Art therapist and asked pt if I could call any family/friends. Pt requested that his wife be updated. I spoke with Hoyle Sauer, his wife and informed her on pt's care. She will be picking him up for d/c.

## 2019-02-13 DIAGNOSIS — M25512 Pain in left shoulder: Secondary | ICD-10-CM | POA: Insufficient documentation

## 2019-02-13 DIAGNOSIS — S42222A 2-part displaced fracture of surgical neck of left humerus, initial encounter for closed fracture: Secondary | ICD-10-CM | POA: Diagnosis not present

## 2019-02-13 DIAGNOSIS — S42202A Unspecified fracture of upper end of left humerus, initial encounter for closed fracture: Secondary | ICD-10-CM | POA: Insufficient documentation

## 2019-02-15 ENCOUNTER — Encounter: Payer: Self-pay | Admitting: Internal Medicine

## 2019-02-15 ENCOUNTER — Ambulatory Visit (INDEPENDENT_AMBULATORY_CARE_PROVIDER_SITE_OTHER): Payer: PPO | Admitting: Internal Medicine

## 2019-02-15 DIAGNOSIS — E119 Type 2 diabetes mellitus without complications: Secondary | ICD-10-CM

## 2019-02-15 DIAGNOSIS — E611 Iron deficiency: Secondary | ICD-10-CM

## 2019-02-15 DIAGNOSIS — E559 Vitamin D deficiency, unspecified: Secondary | ICD-10-CM | POA: Diagnosis not present

## 2019-02-15 DIAGNOSIS — E538 Deficiency of other specified B group vitamins: Secondary | ICD-10-CM

## 2019-02-15 DIAGNOSIS — W19XXXA Unspecified fall, initial encounter: Secondary | ICD-10-CM | POA: Diagnosis not present

## 2019-02-15 MED ORDER — FREESTYLE LITE DEVI: 0 refills | Status: AC

## 2019-02-15 MED ORDER — FREESTYLE LITE TEST VI STRP: ORAL_STRIP | 12 refills | Status: AC

## 2019-02-15 MED ORDER — LANCETS MISC: 12 refills | Status: AC

## 2019-02-15 NOTE — Progress Notes (Signed)
Patient ID: Kent Nolan, male   DOB: 1929/07/03, 85 y.o.   MRN: [id number]  Cumulative time during 7-day interval 16 min, there was not an associated office visit for this concern within a 7 day period.  Verbal consent for services obtained from patient prior to services given.  Names of all persons present for services: Cathlean Cower, MD, patient with wife on speakerphone  Chief complaint: fall x 1 yesterday  History, background, results pertinent:  Here with c/o fall x 1 yesterday without complaint, but seemed to get off balance and unsteady, legs just gave out and fell to floor, no apparent injury.  Pt denies chest pain, increased sob or doe, wheezing, orthopnea, PND, increased LE swelling, palpitations, dizziness or syncope, but does have small feet swelling for several wks per wife.  Pt denies new neurological symptoms such as new headache, or facial or extremity weakness or numbness   Pt denies polydipsia, polyuria.  Not checking sugars recently but will do this if gets meter and supplies.  Denies worsening reflux, abd pain, dysphagia, n/v, bowel change or blood.   Pt denies fever, wt loss, night sweats, loss of appetite, or other constitutional symptoms  Denies urinary symptoms such as dysuria, frequency, urgency, flank pain, hematuria or n/v, fever, chills. Has not had recent PT Past Medical History:  Diagnosis Date  . BRADYCARDIA 08/04/2010   s/p PPM since 2011 - generator change 12/03/13  . Chronic kidney disease    followed by nephrology  . Coronary artery disease    remote subendo MI in February 2005 with stents to RCA &LCX; s/p scute thrombosis of both stents in February 2010 with cardiogenic shock - s/p BMS to both lesions; follow up cath in September 2010 was satisfactory.  He is medically managed with aspirin/Plavix  . GERD (gastroesophageal reflux disease)   . High risk medication use    on amiodarone for PAF/VT since February 2010  . HTN (hypertension)   . Hyperlipidemia   .  Obesity   . Pacemaker   . PAF (paroxysmal atrial fibrillation) ([place])    s/p cardioversion in August 2011  . Stroke ([place])   . Type II or unspecified type diabetes mellitus without mention of complication, uncontrolled    No results found for this or any previous visit (from the past 48 hour(s)). Lab Results  Component Value Date   WBC 6.5 06/13/2018   HGB 14.2 06/13/2018   HCT 42.2 06/13/2018   PLT 187 06/13/2018   GLUCOSE 193 (H) 06/13/2018   CHOL 77 10/24/2017   TRIG 74 10/24/2017   HDL 30 (L) 10/24/2017   LDLDIRECT 45.4 07/23/2012   LDLCALC 32 10/24/2017   ALT 11 06/13/2018   AST 15 06/13/2018   NA 139 06/13/2018   K 4.1 06/13/2018   CL 101 06/13/2018   CREATININE 1.76 (H) 06/13/2018   BUN 26 06/13/2018   CO2 22 06/13/2018   TSH 3.920 06/13/2018   INR 1.04 10/23/2017   HGBA1C 7.9 (A) 08/14/2018   MICROALBUR 1.7 07/22/2015    A/P/next steps:   Fall  - etiology unclear, for labs as ordered, and refer to [place] with RN and PT for eval and treat  DM - for a1c with labs, for glucometer with supplies to check for low sugars contributing to falls  Cathlean Cower MD

## 2019-02-15 NOTE — Patient Instructions (Signed)
Please continue all other medications as before  Please have the pharmacy call with any other refills you may need.  Please keep your appointments with your specialists as you may have planned  Your glucometer and supplies are sent to the pharmacy  Please go to the LAB in the Basement (turn left off the elevator) for the tests to be done today  You will be contacted by phone if any changes need to be made immediately.  Otherwise, you will receive a letter about your results with an explanation, but please check with MyChart first.  Please remember to sign up for MyChart if you have not done so, as this will be important to you in the future with finding out test results, communicating by private email, and scheduling acute appointments online when needed.  You have been referred to [place] with RN and PT

## 2019-02-19 ENCOUNTER — Telehealth: Payer: Self-pay | Admitting: Internal Medicine

## 2019-02-19 NOTE — Telephone Encounter (Signed)
[name] Hoyle Sauer calling to request physical therapy at home. She is unsure of which [facility] health company to use.

## 2019-02-19 NOTE — Telephone Encounter (Signed)
Fortunately we dont need to do this today, since the order was already placed as per last visit June 12

## 2019-02-19 NOTE — Telephone Encounter (Signed)
Spoke to pt's wife, she has been informed that referral has been placed during his last OV. She will look out for a call to start services.

## 2019-02-21 ENCOUNTER — Encounter: Payer: Self-pay | Admitting: Internal Medicine

## 2019-02-21 ENCOUNTER — Other Ambulatory Visit: Payer: Self-pay | Admitting: Internal Medicine

## 2019-02-21 ENCOUNTER — Ambulatory Visit (INDEPENDENT_AMBULATORY_CARE_PROVIDER_SITE_OTHER)
Admission: RE | Admit: 2019-02-21 | Discharge: 2019-02-21 | Disposition: A | Discharge status: Home / Self Care | Payer: PPO | Source: Ambulatory Visit | Attending: Internal Medicine | Admitting: Internal Medicine

## 2019-02-21 ENCOUNTER — Other Ambulatory Visit (INDEPENDENT_AMBULATORY_CARE_PROVIDER_SITE_OTHER): Payer: PPO

## 2019-02-21 ENCOUNTER — Other Ambulatory Visit: Payer: Self-pay

## 2019-02-21 ENCOUNTER — Ambulatory Visit (INDEPENDENT_AMBULATORY_CARE_PROVIDER_SITE_OTHER): Payer: PPO | Admitting: Internal Medicine

## 2019-02-21 VITALS — BP 114/72 | HR 73 | Temp 97.6°F | Ht 67.0 in

## 2019-02-21 DIAGNOSIS — M545 Low back pain, unspecified: Secondary | ICD-10-CM

## 2019-02-21 DIAGNOSIS — G8929 Other chronic pain: Secondary | ICD-10-CM

## 2019-02-21 DIAGNOSIS — E611 Iron deficiency: Secondary | ICD-10-CM | POA: Diagnosis not present

## 2019-02-21 DIAGNOSIS — W19XXXA Unspecified fall, initial encounter: Secondary | ICD-10-CM | POA: Diagnosis not present

## 2019-02-21 DIAGNOSIS — N183 Chronic kidney disease, stage 3 unspecified: Secondary | ICD-10-CM

## 2019-02-21 DIAGNOSIS — T148XXA Other injury of unspecified body region, initial encounter: Secondary | ICD-10-CM | POA: Diagnosis not present

## 2019-02-21 DIAGNOSIS — E538 Deficiency of other specified B group vitamins: Secondary | ICD-10-CM

## 2019-02-21 DIAGNOSIS — L89309 Pressure ulcer of unspecified buttock, unspecified stage: Secondary | ICD-10-CM | POA: Diagnosis not present

## 2019-02-21 DIAGNOSIS — R600 Localized edema: Secondary | ICD-10-CM

## 2019-02-21 DIAGNOSIS — M5442 Lumbago with sciatica, left side: Secondary | ICD-10-CM | POA: Diagnosis not present

## 2019-02-21 DIAGNOSIS — R609 Edema, unspecified: Secondary | ICD-10-CM

## 2019-02-21 DIAGNOSIS — E119 Type 2 diabetes mellitus without complications: Secondary | ICD-10-CM | POA: Diagnosis not present

## 2019-02-21 DIAGNOSIS — S42202D Unspecified fracture of upper end of left humerus, subsequent encounter for fracture with routine healing: Secondary | ICD-10-CM | POA: Diagnosis not present

## 2019-02-21 DIAGNOSIS — E559 Vitamin D deficiency, unspecified: Secondary | ICD-10-CM | POA: Diagnosis not present

## 2019-02-21 DIAGNOSIS — I63411 Cerebral infarction due to embolism of right middle cerebral artery: Secondary | ICD-10-CM | POA: Diagnosis not present

## 2019-02-21 DIAGNOSIS — Z0001 Encounter for general adult medical examination with abnormal findings: Secondary | ICD-10-CM

## 2019-02-21 DIAGNOSIS — Z0000 Encounter for general adult medical examination without abnormal findings: Secondary | ICD-10-CM | POA: Diagnosis not present

## 2019-02-21 DIAGNOSIS — R269 Unspecified abnormalities of gait and mobility: Secondary | ICD-10-CM | POA: Diagnosis not present

## 2019-02-21 DIAGNOSIS — S42302A Unspecified fracture of shaft of humerus, left arm, initial encounter for closed fracture: Secondary | ICD-10-CM | POA: Insufficient documentation

## 2019-02-21 LAB — HEPATIC FUNCTION PANEL
ALT: 22 U/L (ref 0–53)
AST: 33 U/L (ref 0–37)
Albumin: 3.4 g/dL — ABNORMAL LOW (ref 3.5–5.2)
Alkaline Phosphatase: 63 U/L (ref 39–117)
Bilirubin, Direct: 1 mg/dL — ABNORMAL HIGH (ref 0.0–0.3)
Total Bilirubin: 2.3 mg/dL — ABNORMAL HIGH (ref 0.2–1.2)
Total Protein: 6.8 g/dL (ref 6.0–8.3)

## 2019-02-21 LAB — CBC WITH DIFFERENTIAL/PLATELET
Basophils Absolute: 0 10*3/uL (ref 0.0–0.1)
Basophils Relative: 0.4 % (ref 0.0–3.0)
Eosinophils Absolute: 0 10*3/uL (ref 0.0–0.7)
Eosinophils Relative: 0.4 % (ref 0.0–5.0)
HCT: 37.5 % — ABNORMAL LOW (ref 39.0–52.0)
Hemoglobin: 12.3 g/dL — ABNORMAL LOW (ref 13.0–17.0)
Lymphocytes Relative: 5.8 % — ABNORMAL LOW (ref 12.0–46.0)
Lymphs Abs: 0.5 10*3/uL — ABNORMAL LOW (ref 0.7–4.0)
MCHC: 32.8 g/dL (ref 30.0–36.0)
MCV: 97.3 fl (ref 78.0–100.0)
Monocytes Absolute: 0.5 10*3/uL (ref 0.1–1.0)
Monocytes Relative: 7 % (ref 3.0–12.0)
Neutro Abs: 6.8 10*3/uL (ref 1.4–7.7)
Neutrophils Relative %: 86.4 % — ABNORMAL HIGH (ref 43.0–77.0)
Platelets: 230 10*3/uL (ref 150.0–400.0)
RBC: 3.85 Mil/uL — ABNORMAL LOW (ref 4.22–5.81)
RDW: 13.9 % (ref 11.5–15.5)
WBC: 7.8 10*3/uL (ref 4.0–10.5)

## 2019-02-21 LAB — IBC PANEL
Iron: 38 ug/dL — ABNORMAL LOW (ref 42–165)
Saturation Ratios: 11.3 % — ABNORMAL LOW (ref 20.0–50.0)
Transferrin: 240 mg/dL (ref 212.0–360.0)

## 2019-02-21 LAB — LIPID PANEL
Cholesterol: 75 mg/dL (ref 0–200)
HDL: 33.1 mg/dL — ABNORMAL LOW (ref >39.00)
LDL Cholesterol: 21 mg/dL (ref 0–99)
NonHDL: 42.04
Total CHOL/HDL Ratio: 2
Triglycerides: 104 mg/dL (ref 0.0–149.0)
VLDL: 20.8 mg/dL (ref 0.0–40.0)

## 2019-02-21 LAB — BASIC METABOLIC PANEL
BUN: 53 mg/dL — ABNORMAL HIGH (ref 6–23)
CO2: 24 mEq/L (ref 19–32)
Calcium: 9.6 mg/dL (ref 8.4–10.5)
Chloride: 102 mEq/L (ref 96–112)
Creatinine, Ser: 1.84 mg/dL — ABNORMAL HIGH (ref 0.40–1.50)
GFR: 34.89 mL/min — ABNORMAL LOW (ref >60.00)
Glucose, Bld: 124 mg/dL — ABNORMAL HIGH (ref 70–99)
Potassium: 4.1 mEq/L (ref 3.5–5.1)
Sodium: 138 mEq/L (ref 135–145)

## 2019-02-21 LAB — SEDIMENTATION RATE: Sed Rate: 50 mm/hr — ABNORMAL HIGH (ref 0–20)

## 2019-02-21 LAB — BRAIN NATRIURETIC PEPTIDE: Pro B Natriuretic peptide (BNP): 506 pg/mL — ABNORMAL HIGH (ref 0.0–100.0)

## 2019-02-21 LAB — PROTIME-INR
INR: 1.4 ratio — ABNORMAL HIGH (ref 0.8–1.0)
Prothrombin Time: 16.2 s — ABNORMAL HIGH (ref 9.6–13.1)

## 2019-02-21 LAB — TSH: TSH: 3.07 u[IU]/mL (ref 0.35–4.50)

## 2019-02-21 LAB — VITAMIN B12: Vitamin B-12: 729 pg/mL (ref 211–911)

## 2019-02-21 LAB — HEMOGLOBIN A1C: Hgb A1c MFr Bld: 5.8 % (ref 4.6–6.5)

## 2019-02-21 LAB — VITAMIN D 25 HYDROXY (VIT D DEFICIENCY, FRACTURES): VITD: 57.65 ng/mL (ref 30.00–100.00)

## 2019-02-21 MED ORDER — HYDROCODONE-ACETAMINOPHEN 5-325 MG PO TABS: 1.0000 | ORAL_TABLET | Freq: Four times a day (QID) | ORAL | 0 refills | Status: AC | PRN

## 2019-02-21 MED ORDER — POLYSACCHARIDE IRON COMPLEX 150 MG PO CAPS: 150.0000 mg | ORAL_CAPSULE | Freq: Every day | ORAL | 0 refills | Status: AC

## 2019-02-21 NOTE — Patient Instructions (Addendum)
Ok to stop the tramadol  Please take all new medication as prescribed - the hydrocodone as needed  (we are only allowed to give one week medication to start, so please call in 1 wk if more is needed)  Please continue all other medications as before, and refills have been done if requested.  Please have the pharmacy call with any other refills you may need.  Please continue your efforts at being more active, low cholesterol diet, and weight control.  You are otherwise up to date with prevention measures today.  Please keep your appointments with your specialists as you may have planned  You will be contacted regarding the referral for: Home Health with RN, PT, and skin care consult  You will be contacted regarding the referral for: [facility] who work to do anything else needed in order to not have to go back to the hospital  Please go to the XRAY Department in the Basement (go straight as you get off the elevator) for the x-ray testing  Please go to the LAB in the Basement (turn left off the elevator) for the tests to be done today  You will be contacted by phone if any changes need to be made immediately.  Otherwise, you will receive a letter about your results with an explanation, but please check with MyChart first.  Please remember to sign up for MyChart if you have not done so, as this will be important to you in the future with finding out test results, communicating by private email, and scheduling acute appointments online when needed.

## 2019-02-21 NOTE — Progress Notes (Signed)
Subjective:    Patient ID: Kent Nolan, male    DOB: [birth year], 85 y.o.   MRN: [id number]  HPI    Here with family; Here for wellness and f/u; Overall doing fair,  Family and Pt deny Chest pain, worsening SOB, DOE, wheezing, orthopnea, PND, worsening LE edema, palpitations, dizziness or syncope.  Pt denies neurological change such as new headache, facial or extremity weakness.  Pt denies polydipsia, polyuria   Pt states overall good compliance with treatment and medications with family assist, good tolerability.  They deny worsening depressive symptoms, suicidal ideation or panic. No fever, night sweats, wt loss, loss of appetite, or other constitutional symptoms.  Pt states poor ability with ADL's however, has mod to high fall risk, home safety reviewed and adequate, no other significant changes in hearing or vision, and not active with exercise. In fact also has worsening ambulatory ability, unsteady and unsafe with recent fall and left humerus fracture seen at ED, then did see [facility] after.  No casting needed. Very large bruising has seemed to have tracked down the entire left arm to hand post fx.  Also, Pt continues to have recurring LBP starting after the fall, but no bowel or bladder change, fever, worsening LE pain/numbness/weakness, gait change or falls.  Does also have early buttock sores bilaterally per family due to increased sitting in one place.  Also with slight increase in LE edema with sitting as well.   Past Medical History:  Diagnosis Date  . BRADYCARDIA 08/04/2010   s/p PPM since 2011 - generator change 12/03/13  . Chronic kidney disease    followed by nephrology  . Coronary artery disease    remote subendo MI in February 2005 with stents to RCA &LCX; s/p scute thrombosis of both stents in February 2010 with cardiogenic shock - s/p BMS to both lesions; follow up cath in September 2010 was satisfactory.  He is medically managed with aspirin/Plavix  . GERD (gastroesophageal  reflux disease)   . High risk medication use    on amiodarone for PAF/VT since February 2010  . HTN (hypertension)   . Hyperlipidemia   . Obesity   . Pacemaker   . PAF (paroxysmal atrial fibrillation) (HCC)    s/p cardioversion in August 2011  . Stroke (HCC)   . Type II or unspecified type diabetes mellitus without mention of complication, uncontrolled    Past Surgical History:  Procedure Laterality Date  . EP IMPLANTABLE DEVICE N/A 02/25/2015   Procedure: Lead Revision/Repair;  Surgeon: Hillis RangeJames Allred, MD;  Location: [facility] INVASIVE CV LAB;  Service: Cardiovascular;  Laterality: N/A;  . IR CT HEAD LTD  10/23/2017  . IR PERCUTANEOUS ART THROMBECTOMY/INFUSION INTRACRANIAL INC DIAG ANGIO  10/23/2017  . PACEMAKER GENERATOR CHANGE  12/03/13   MDT Adapta L generator change for premature ERI by [name]  . PACEMAKER INSERTION  May 2011  . PERMANENT PACEMAKER GENERATOR CHANGE N/A 12/03/2013   Procedure: PERMANENT PACEMAKER GENERATOR CHANGE;  Surgeon: Gardiner RhymeJames D Allred, MD;  Location: [facility] CATH LAB;  Service: Cardiovascular;  Laterality: N/A;  . RADIOLOGY WITH ANESTHESIA N/A 10/23/2017   Procedure: RADIOLOGY WITH ANESTHESIA;  Surgeon: Julieanne Cottoneveshwar, Sanjeev, MD;  Location: [facility] OR;  Service: Radiology;  Laterality: N/A;    reports that he has quit smoking. His smoking use included cigarettes. He smoked 3.00 packs per day. He has never used smokeless tobacco. He reports that he does not drink alcohol or use drugs. family history includes Diabetes in his father; Heart disease  in his mother; Stomach cancer in his father. Allergies  Allergen Reactions  . Penicillins Other (See Comments)    Childhood Allergy Has patient had a PCN reaction causing immediate rash, facial/tongue/throat swelling, SOB or lightheadedness with hypotension: Unknown Has patient had a PCN reaction causing severe rash involving mucus membranes or skin necrosis: Unknown Has patient had a PCN reaction that required hospitalization: Unknown Has  patient had a PCN reaction occurring within the last 10 years: No If all of the above answers are "NO", then may proceed with Cephalosporin use.   . Sulfonamide Derivatives Other (See Comments)    Reaction unknown  . Tramadol Other (See Comments)    hallucinations   Current Outpatient Medications on File Prior to Visit  Medication Sig Dispense Refill  . amiodarone (PACERONE) 200 MG tablet TAKE 1/2 TABLET BY MOUTH EVERY DAY (Patient taking differently: Take 100 mg by mouth daily. TAKE 1/2 TABLET BY MOUTH EVERY DAY) 45 tablet 2  . aspirin EC 81 MG tablet Take 81 mg by mouth daily.    Marland Kitchen. azelastine (OPTIVAR) 0.05 % ophthalmic solution Place 1 drop into both eyes 2 (two) times daily. (Patient taking differently: Place 1 drop into both eyes 2 (two) times daily as needed (dry eyes). ) 6 mL 12  . Blood Glucose Monitoring Suppl (FREESTYLE LITE) DEVI Use as directed once daily E11.9 1 each 0  . Cholecalciferol (VITAMIN D-3 PO) Take 1 tablet by mouth daily.    Marland Kitchen. ELIQUIS 2.5 MG TABS tablet TAKE 1 TABLET BY MOUTH TWICE A DAY (Patient taking differently: Take 2.5 mg by mouth 2 (two) times daily. ) 180 tablet 3  . famotidine (PEPCID) 40 MG tablet TAKE 1 TABLET BY MOUTH EVERY DAY (Patient taking differently: Take 40 mg by mouth daily. ) 90 tablet 3  . fenofibrate (TRICOR) 145 MG tablet TAKE 1 TABLET BY MOUTH EVERY DAY (Patient taking differently: Take 145 mg by mouth daily. ) 90 tablet 3  . glipiZIDE (GLUCOTROL XL) 2.5 MG 24 hr tablet Take 1 tablet (2.5 mg total) by mouth daily with breakfast. 90 tablet 3  . glucose blood (FREESTYLE LITE) test strip Use as instructed once daily E11.9 100 each 12  . Lancets MISC Use as directed once daily E11.9 100 each 12  . lidocaine (LIDODERM) 5 % Place 1 patch onto the skin daily. Remove & Discard patch within 12 hours or as directed by MD (Patient not taking: Reported on 02/22/2019) 60 patch 1  . Multiple Vitamin (MULTIVITAMIN) tablet Take 1 tablet by mouth daily.      .  nitroGLYCERIN (NITROSTAT) 0.4 MG SL tablet Place 1 tablet (0.4 mg total) under the tongue every 5 (five) minutes as needed. For chest pain 25 tablet 5  . rosuvastatin (CRESTOR) 20 MG tablet Take 1 tablet (20 mg total) by mouth daily. 90 tablet 3   No current facility-administered medications on file prior to visit.    Review of Systems Constitutional: Negative for other unusual diaphoresis, sweats, appetite HENT: Negative for other worsening hearing loss, ear pain, facial swelling, mouth sores or neck stiffness.   Eyes: Negative for other worsening pain, redness or other visual disturbance.  Respiratory: Negative for other stridor or swelling Cardiovascular: Negative for other palpitations or other chest pain  Gastrointestinal: Negative for worsening diarrhea or loose stools, blood in stool, distention or other pain Genitourinary: Negative for hematuria, flank pain.  Musculoskeletal: Negative for other joint swelling.  Skin: Negative for worsening drainage.  Neurological: Negative for other  syncope or numbness. Hematological: Negative for other adenopathy or swelling Psychiatric/Behavioral: Negative for hallucinations, other worsening agitation, SI, self-injury, or new decreased concentration All other system neg per pt and family    Objective:   Physical Exam BP 114/72   Pulse 73   Temp 97.6 F (36.4 C) (Oral)   Ht 5\' 7"  (1.702 m)   SpO2 98%   BMI 28.04 kg/m  VS noted, overwt, sleepy in wheelchair but easily arousable Constitutional: Pt appears in NAD HENT: Head: NCAT.  Right Ear: External ear normal.  Left Ear: External ear normal.  Eyes: . Pupils are equal, round, and reactive to light. Conjunctivae and EOM are normal Nose: without d/c or deformity Neck: Neck supple. Gross normal ROM Cardiovascular: Normal rate and regular rhythm.   Pulmonary/Chest: Effort normal and breath sounds without rales or wheezing.  Abd:  Soft, NT, ND, + BS, no organomegaly Neurological: Pt is  alert. At baseline orientation, motor grossly intact Skin: Skin is warm. No rashes, other new lesions, chronic 1+ LE edema Psychiatric: Pt behavior is normal without agitation  No other exam findings Lab Results  Component Value Date   WBC 8.4 02/22/2019   HGB 11.7 (L) 02/22/2019   HCT 36.9 (L) 02/22/2019   PLT 222 02/22/2019   GLUCOSE 142 (H) 02/22/2019   CHOL 75 02/21/2019   TRIG 104.0 02/21/2019   HDL 33.10 (L) 02/21/2019   LDLDIRECT 45.4 07/23/2012   LDLCALC 21 02/21/2019   ALT 27 02/22/2019   AST 42 (H) 02/22/2019   NA 137 02/22/2019   K 4.1 02/22/2019   CL 101 02/22/2019   CREATININE 1.69 (H) 02/22/2019   BUN 55 (H) 02/22/2019   CO2 26 02/22/2019   TSH 3.07 02/21/2019   INR 1.4 (H) 02/21/2019   HGBA1C 5.8 02/21/2019   MICROALBUR 1.7 07/22/2015          Assessment & Plan:

## 2019-02-22 ENCOUNTER — Encounter: Payer: Self-pay | Admitting: Internal Medicine

## 2019-02-22 ENCOUNTER — Other Ambulatory Visit: Payer: Self-pay

## 2019-02-22 ENCOUNTER — Emergency Department (HOSPITAL_COMMUNITY)
Admission: EM | Admit: 2019-02-22 | Discharge: 2019-02-23 | Disposition: A | Discharge status: Home Health | Payer: PPO | Attending: Emergency Medicine | Admitting: Emergency Medicine

## 2019-02-22 ENCOUNTER — Emergency Department (HOSPITAL_COMMUNITY): Payer: PPO

## 2019-02-22 ENCOUNTER — Telehealth: Payer: Self-pay

## 2019-02-22 ENCOUNTER — Ambulatory Visit: Payer: Self-pay | Admitting: Internal Medicine

## 2019-02-22 DIAGNOSIS — R531 Weakness: Secondary | ICD-10-CM

## 2019-02-22 DIAGNOSIS — Z950 Presence of cardiac pacemaker: Secondary | ICD-10-CM | POA: Insufficient documentation

## 2019-02-22 DIAGNOSIS — L22 Diaper dermatitis: Secondary | ICD-10-CM | POA: Diagnosis not present

## 2019-02-22 DIAGNOSIS — M549 Dorsalgia, unspecified: Secondary | ICD-10-CM

## 2019-02-22 DIAGNOSIS — Z79899 Other long term (current) drug therapy: Secondary | ICD-10-CM | POA: Diagnosis not present

## 2019-02-22 DIAGNOSIS — I10 Essential (primary) hypertension: Secondary | ICD-10-CM | POA: Diagnosis not present

## 2019-02-22 DIAGNOSIS — R4182 Altered mental status, unspecified: Secondary | ICD-10-CM | POA: Diagnosis not present

## 2019-02-22 DIAGNOSIS — X58XXXA Exposure to other specified factors, initial encounter: Secondary | ICD-10-CM | POA: Diagnosis not present

## 2019-02-22 DIAGNOSIS — R339 Retention of urine, unspecified: Secondary | ICD-10-CM | POA: Diagnosis not present

## 2019-02-22 DIAGNOSIS — Z20828 Contact with and (suspected) exposure to other viral communicable diseases: Secondary | ICD-10-CM | POA: Insufficient documentation

## 2019-02-22 DIAGNOSIS — S42202D Unspecified fracture of upper end of left humerus, subsequent encounter for fracture with routine healing: Secondary | ICD-10-CM | POA: Diagnosis not present

## 2019-02-22 DIAGNOSIS — E1122 Type 2 diabetes mellitus with diabetic chronic kidney disease: Secondary | ICD-10-CM | POA: Diagnosis not present

## 2019-02-22 DIAGNOSIS — R1031 Right lower quadrant pain: Secondary | ICD-10-CM | POA: Diagnosis not present

## 2019-02-22 DIAGNOSIS — Z8673 Personal history of transient ischemic attack (TIA), and cerebral infarction without residual deficits: Secondary | ICD-10-CM | POA: Insufficient documentation

## 2019-02-22 DIAGNOSIS — I129 Hypertensive chronic kidney disease with stage 1 through stage 4 chronic kidney disease, or unspecified chronic kidney disease: Secondary | ICD-10-CM | POA: Diagnosis not present

## 2019-02-22 DIAGNOSIS — R918 Other nonspecific abnormal finding of lung field: Secondary | ICD-10-CM | POA: Diagnosis not present

## 2019-02-22 DIAGNOSIS — T148XXA Other injury of unspecified body region, initial encounter: Secondary | ICD-10-CM

## 2019-02-22 DIAGNOSIS — M47816 Spondylosis without myelopathy or radiculopathy, lumbar region: Secondary | ICD-10-CM | POA: Diagnosis not present

## 2019-02-22 DIAGNOSIS — R52 Pain, unspecified: Secondary | ICD-10-CM | POA: Diagnosis not present

## 2019-02-22 DIAGNOSIS — N189 Chronic kidney disease, unspecified: Secondary | ICD-10-CM | POA: Diagnosis not present

## 2019-02-22 DIAGNOSIS — Z7984 Long term (current) use of oral hypoglycemic drugs: Secondary | ICD-10-CM | POA: Insufficient documentation

## 2019-02-22 DIAGNOSIS — N281 Cyst of kidney, acquired: Secondary | ICD-10-CM | POA: Diagnosis not present

## 2019-02-22 DIAGNOSIS — Z87891 Personal history of nicotine dependence: Secondary | ICD-10-CM | POA: Diagnosis not present

## 2019-02-22 DIAGNOSIS — J9811 Atelectasis: Secondary | ICD-10-CM | POA: Diagnosis not present

## 2019-02-22 DIAGNOSIS — M48061 Spinal stenosis, lumbar region without neurogenic claudication: Secondary | ICD-10-CM | POA: Diagnosis not present

## 2019-02-22 DIAGNOSIS — R0902 Hypoxemia: Secondary | ICD-10-CM | POA: Diagnosis not present

## 2019-02-22 DIAGNOSIS — S40022A Contusion of left upper arm, initial encounter: Secondary | ICD-10-CM | POA: Diagnosis not present

## 2019-02-22 NOTE — Telephone Encounter (Signed)
Pt.'s wife called to report "he doesn't feel good today with all his leg swelling." Has tried the new pain medication. States she will pick it up from the pharmacy and see if it helps.Instructed there are triage nurses she can talk to until 7 p.m. Verbalizes understanding.

## 2019-02-22 NOTE — ED Provider Notes (Signed)
[facility]-EMERGENCY DEPT Provider Note   CSN: [id number] Arrival date & time: 02/22/19  2141    History   Chief Complaint Chief Complaint  Patient presents with   Urinary Tract Infection    HPI Kent Nolan is a 85 y.o. male with a past medical history of stroke, A. fib/bradycardia with pacer in place, CKD, CAD, diabetes, who presents today for evaluation of urinary difficulties and altered mental status. Patient is very hard of hearing and difficult to understand with cognitive deficits from previous stroke limiting his ability to provide history.    Patient reportedly has been having difficulties urinating since Tuesday or Wednesday.  It is 10 out of 10 pain in his penis when he urinates.  His urine is dark in color with a foul, unusual odor.  Patient was reportedly altered yesterday and was "talking nonsense."  Chart review shows that he was seen yesterday by his PCP.  Labs were obtained showing an elevated neutrophil count without leukocytosis.  It does not appear that urine was obtained.  He also reports that his legs have been swollen more than usual.  He reports pain in his stomach near his rectum and indicates he also has pain suprapubically.     HPI  Past Medical History:  Diagnosis Date   BRADYCARDIA 08/04/2010   s/p PPM since 2011 - generator change 12/03/13   Chronic kidney disease    followed by nephrology   Coronary artery disease    remote subendo MI in February 2005 with stents to RCA &LCX; s/p scute thrombosis of both stents in February 2010 with cardiogenic shock - s/p BMS to both lesions; follow up cath in September 2010 was satisfactory.  He is medically managed with aspirin/Plavix   GERD (gastroesophageal reflux disease)    High risk medication use    on amiodarone for PAF/VT since February 2010   HTN (hypertension)    Hyperlipidemia    Obesity    Pacemaker    PAF (paroxysmal atrial fibrillation) (HCC)    s/p  cardioversion in August 2011   Stroke [facility](HCC)    Type II or unspecified type diabetes mellitus without mention of complication, uncontrolled     Patient Active Problem List   Diagnosis Date Noted   Left humeral fracture 02/21/2019   Bruising 02/21/2019   Peripheral edema 02/21/2019   Fall 02/15/2019   Allergic conjunctivitis 08/14/2018   Gait disturbance, post-stroke    Cognitive deficit, post-stroke    Stroke (cerebrum) (HCC) 10/23/2017   Middle cerebral artery embolism, 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   Lower back pain 03/30/2014   Encounter for well adult exam with abnormal findings 11/26/2013   Diabetes (HCC) 11/26/2013   Coronary artery disease    PAF (paroxysmal atrial fibrillation) (HCC)    GERD (gastroesophageal reflux disease)    Hyperlipidemia    Chronic kidney disease    Pacemaker    Essential hypertension, benign 08/04/2010   Sick sinus syndrome (HCC) 08/04/2010    Past Surgical History:  Procedure Laterality Date   EP IMPLANTABLE DEVICE N/A 02/25/2015   Procedure: Lead Revision/Repair;  Surgeon: Hillis RangeJames Allred, MD;  Location: [facility] INVASIVE CV LAB;  Service: Cardiovascular;  Laterality: N/A;   IR CT HEAD LTD  10/23/2017   IR PERCUTANEOUS ART THROMBECTOMY/INFUSION INTRACRANIAL INC DIAG ANGIO  10/23/2017   PACEMAKER GENERATOR CHANGE  12/03/13   MDT Adapta L generator change for premature ERI by [name]   PACEMAKER INSERTION  May 2011  PERMANENT PACEMAKER GENERATOR CHANGE N/A 12/03/2013   Procedure: PERMANENT PACEMAKER GENERATOR CHANGE;  Surgeon: [name], MD;  Location: [facility] CATH LAB;  Service: Cardiovascular;  Laterality: N/A;   RADIOLOGY WITH ANESTHESIA N/A 10/23/2017   Procedure: RADIOLOGY WITH ANESTHESIA;  Surgeon: [name], MD;  Location: [facility] OR;  Service: Radiology;  Laterality: N/A;        Home Medications    Prior to Admission medications   Medication Sig Start Date End Date Taking? Authorizing  Provider  acetaminophen (TYLENOL) 500 MG tablet Take 500 mg by mouth every 6 (six) hours as needed for moderate pain.   Yes [provider]  amiodarone (PACERONE) 200 MG tablet TAKE 1/2 TABLET BY MOUTH EVERY DAY Patient taking differently: Take 100 mg by mouth daily. TAKE 1/2 TABLET BY MOUTH EVERY DAY 12/17/18  Yes Rosalio Macadamia, NP  aspirin EC 81 MG tablet Take 81 mg by mouth daily.   Yes [provider]  azelastine (OPTIVAR) 0.05 % ophthalmic solution Place 1 drop into both eyes 2 (two) times daily. Patient taking differently: Place 1 drop into both eyes 2 (two) times daily as needed (dry eyes).  08/14/18  Yes Corwin Levins, MD  Cholecalciferol (VITAMIN D-3 PO) Take 1 tablet by mouth daily.   Yes [provider]  ELIQUIS 2.5 MG TABS tablet TAKE 1 TABLET BY MOUTH TWICE A DAY Patient taking differently: Take 2.5 mg by mouth 2 (two) times daily.  06/19/18  Yes Allred, Fayrene Fearing, MD  famotidine (PEPCID) 40 MG tablet TAKE 1 TABLET BY MOUTH EVERY DAY Patient taking differently: Take 40 mg by mouth daily.  06/28/18  Yes Allred, Fayrene Fearing, MD  fenofibrate (TRICOR) 145 MG tablet TAKE 1 TABLET BY MOUTH EVERY DAY Patient taking differently: Take 145 mg by mouth daily.  05/11/18  Yes Allred, Fayrene Fearing, MD  glipiZIDE (GLUCOTROL XL) 2.5 MG 24 hr tablet Take 1 tablet (2.5 mg total) by mouth daily with breakfast. 08/14/18  Yes Corwin Levins, MD  HYDROcodone-acetaminophen (NORCO/VICODIN) 5-325 MG tablet Take 1 tablet by mouth every 6 (six) hours as needed for moderate pain. 02/21/19  Yes Corwin Levins, MD  Multiple Vitamin (MULTIVITAMIN) tablet Take 1 tablet by mouth daily.     Yes [provider]  rosuvastatin (CRESTOR) 20 MG tablet Take 1 tablet (20 mg total) by mouth daily. 07/18/18  Yes Allred, Fayrene Fearing, MD  Blood Glucose Monitoring Suppl (FREESTYLE LITE) DEVI Use as directed once daily E11.9 02/15/19   Corwin Levins, MD  glucose blood (FREESTYLE LITE) test strip Use as instructed once  daily E11.9 02/15/19   Corwin Levins, MD  iron polysaccharides (NU-IRON) 150 MG capsule Take 1 capsule (150 mg total) by mouth daily. Patient not taking: Reported on 02/22/2019 02/21/19   Corwin Levins, MD  Lancets MISC Use as directed once daily E11.9 02/15/19   Corwin Levins, MD  lidocaine (LIDODERM) 5 % Place 1 patch onto the skin daily. Remove & Discard patch within 12 hours or as directed by MD Patient not taking: Reported on 02/22/2019 04/11/18   Corwin Levins, MD  nitroGLYCERIN (NITROSTAT) 0.4 MG SL tablet Place 1 tablet (0.4 mg total) under the tongue every 5 (five) minutes as needed. For chest pain 02/27/15   [name], MD    Family History Family History  Problem Relation Age of Onset   Heart disease Mother    Stomach cancer Father    Diabetes Father     Social History  Social History   Tobacco Use   Smoking status: Former Smoker    Packs/day: 3.00    Types: Cigarettes   Smokeless tobacco: Never Used   [place]obacco comment[place]  Substance Use Topics   Alcohol use: No   Drug use: No     Allergies   Penicillins, Sulfonamide derivatives, and Tramadol   Review of Systems Review of Systems  Unable to perform ROS: Mental status change  All other systems reviewed and are negative.    Physical Exam Updated Vital Signs BP 112/62    Pulse 65    Temp 99.7 F (37.6 C) (Rectal)    Resp 20    Ht 5\' 7"  (1.702 m)    Wt 81.6 kg    SpO2 97%    BMI 28.19 kg/m   Physical Exam Vitals signs and nursing note reviewed. Exam conducted with a chaperone present.  Constitutional:      General: He is not in acute distress.    Appearance: He is well-developed. He is not diaphoretic.  HENT:     Head: Normocephalic and atraumatic.     Nose: Nose normal.     Mouth/Throat:     Mouth: Mucous membranes are moist.  Eyes:     General: No scleral icterus.       Right eye: No discharge.        Left eye: No discharge.     Conjunctiva/sclera: Conjunctivae normal.  Neck:       Musculoskeletal: Normal range of motion. No muscular tenderness.  Cardiovascular:     Rate and Rhythm: Normal rate and regular rhythm.     Pulses: Normal pulses.  Pulmonary:     Effort: Pulmonary effort is normal. No respiratory distress.     Breath sounds: Normal breath sounds. No stridor.  Chest:     Chest wall: No tenderness.  Abdominal:     General: There is no distension.     Tenderness: There is abdominal tenderness (RLQ, suprapubic. ).  Genitourinary:    Comments: The penis, testes bilateral inguinal areas and perirectal areas are red, appear raw with apparent candidal infection and significant dermatitis. Musculoskeletal:        General: No deformity.     Right lower leg: Edema present.     Left lower leg: Edema present.  Skin:    General: Skin is warm and dry.     Capillary Refill: Capillary refill takes less than 2 seconds.     Comments: Please see clinical image.  There is extensive ecchymosis on the left upper extremity the left side of chest/flank.  This appears old.  Neurological:     Mental Status: He is alert and oriented to person, place, and time.     Cranial Nerves: No cranial nerve deficit.     Motor: No abnormal muscle tone.     Comments: Patient is hard of hearing, difficult to understand Able to lift bilateral legs off bed, able to squeeze fingers with bilateral hands equally.  No facial droop.              ED Treatments / Results  Labs (all labs ordered are listed, but only abnormal results are displayed) Labs Reviewed  COMPREHENSIVE METABOLIC PANEL - Abnormal; Notable for the following components:      Result Value   Glucose, Bld 142 (*)    BUN 55 (*)    Creatinine, Ser 1.69 (*)    Albumin 3.2 (*)    AST  42 (*)    Total Bilirubin 2.3 (*)    GFR calc non Af Amer 36 (*)    GFR calc Af Amer 41 (*)    All other components within normal limits  CBC WITH DIFFERENTIAL/PLATELET - Abnormal; Notable for the following components:   RBC 3.68 (*)     Hemoglobin 11.7 (*)    HCT 36.9 (*)    MCV 100.3 (*)    All other components within normal limits  URINALYSIS, ROUTINE W REFLEX MICROSCOPIC - Abnormal; Notable for the following components:   Color, Urine AMBER (*)    Hgb urine dipstick MODERATE (*)    Bacteria, UA RARE (*)    All other components within normal limits  BRAIN NATRIURETIC PEPTIDE - Abnormal; Notable for the following components:   B Natriuretic Peptide 416.7 (*)    All other components within normal limits  URINE CULTURE  NOVEL CORONAVIRUS, NAA (HOSPITAL ORDER, SEND-OUT TO REF LAB)    EKG None  Radiology Ct Abdomen Pelvis Wo Contrast  Result Date: 02/23/2019 CLINICAL DATA:  85 y/o M; Back pain, no red flags, initial exam Abd pain, acute, generalized. EXAM: CT ABDOMEN AND PELVIS WITHOUT CONTRAST CT LUMBAR SPINE WITHOUT CONTRAST TECHNIQUE: Multidetector CT imaging of the abdomen and pelvis was performed following the standard protocol without IV contrast. Multidetector CT imaging of the lumbar spine was performed without intravenous contrast administration. Multiplanar CT image reconstructions were also generated. COMPARISON:  None. FINDINGS: Abdomen and pelvis: Lower chest: Pacemaker leads and severe coronary artery calcific atherosclerosis. Hepatobiliary: No focal liver abnormality is seen. No gallstones, gallbladder wall thickening, or biliary dilatation. Pancreas: Diffuse pancreatic compatible with chronic pancreatitis. No peripancreatic inflammation or main duct dilatation. Spleen: Scattered calcified granulomata. Adrenals/Urinary Tract: Normal adrenal glands. Left kidney upper pole cyst measuring 26 mm. No additional kidney lesion or urinary stone disease. No hydronephrosis. The bladder is collapsed around a Foley catheter. Stomach/Bowel: Stomach is within normal limits. Appendix appears normal. No evidence of bowel wall thickening, distention, or inflammatory changes. Vascular/Lymphatic: Aortic atherosclerosis. No  enlarged abdominal or pelvic lymph nodes. Reproductive: Prosthetic calcification. Other: No abdominal wall hernia or abnormality. No abdominopelvic ascites. Musculoskeletal: No fracture is seen. Lumbar spine: Segmentation: 5 lumbar type vertebrae. Alignment: Mild-to-moderate lumbar levocurvature with apex at L2. Straightening of lumbar lordosis without listhesis. Vertebrae: No acute fracture or focal pathologic process. Large superior endplate Schmorl's nodes at L3 and L4. Paraspinal and other soft tissues: As above. Disc levels: Advanced lumbar spondylosis with moderate to severe loss of intervertebral disc space height throughout the lumbar spine greatest at the L4-5 level where there is vacuum phenomenon. Multilevel large endplate marginal osteophytes. Lumbar facet arthrosis greatest at the L3-L5 levels. Endplate marginal osteophytes and facet hypertrophy result in multilevel neural foraminal stenosis greatest at the left L4-5 level where it is moderate to severe. Additionally, there are multiple levels of spinal canal stenosis greatest at the L3-4 level where it is severe. IMPRESSION: 1. No acute process identified. 2. Advanced lumbar spondylosis greatest at L3-4 where there is severe spinal canal stenosis. 3. Coronary artery and aortic calcific atherosclerosis. 4. Findings of chronic pancreatitis. Electronically Signed   By: Mitzi HansenLance  Furusawa-Stratton M.D.   On: 02/23/2019 02:52   Dg Chest 2 View  Result Date: 02/23/2019 CLINICAL DATA:  Altered mental status yesterday.  Dysuria. EXAM: CHEST - 2 VIEW COMPARISON:  Single-view of the chest 10/23/2017. Plain films left upper arm 02/12/2019. FINDINGS: Lung volumes are low. Mild left basilar atelectasis noted. Lungs otherwise  clear. Heart size upper normal. Aortic atherosclerosis. Pacing device in place. No pneumothorax or pleural effusion. Surgical neck fracture left humerus as seen on comparison plain films of the left upper arm noted. IMPRESSION: No acute  disease. Atherosclerosis. Surgical neck fracture left humerus as seen on comparison plain films. Electronically Signed   By: Drusilla Kanner M.D.   On: 02/23/2019 02:34   Dg Lumbar Spine Complete  Result Date: 02/22/2019 CLINICAL DATA:  Low back pain after fall 1 week ago EXAM: LUMBAR SPINE - COMPLETE 4+ VIEW COMPARISON:  None. FINDINGS: Five lumbar type vertebrae. There is advanced and generalized lumbar degenerative disc narrowing, particularly severe at L4-5. Mild scoliosis with asymmetric bulky right far-lateral endplate spurring. Mid and lower lumbar facet spurring. No evidence of acute fracture. No evident bone lesion or erosion. There is extensive atherosclerotic calcification. Rounded calcification in left upper quadrant that could be ingested, pancreatic, or atherosclerotic. IMPRESSION: 1. No evident fracture. 2. Advanced degenerative disease with scoliosis. Electronically Signed   By: Marnee Spring M.D.   On: 02/22/2019 08:03   Ct Head Wo Contrast  Result Date: 02/22/2019 CLINICAL DATA:  Onset altered mental status yesterday. EXAM: CT HEAD WITHOUT CONTRAST TECHNIQUE: Contiguous axial images were obtained from the base of the skull through the vertex without intravenous contrast. COMPARISON:  Head CT scan 02/12/2019. FINDINGS: Brain: No evidence of acute infarction, hemorrhage, hydrocephalus, extra-axial collection or mass lesion/mass effect. Atrophy and chronic microvascular ischemic change noted. Vascular: No hyperdense vessel or unexpected calcification. Skull: Intact.  No focal lesion. Sinuses/Orbits: Negative. Other: None. IMPRESSION: No acute abnormality. Atrophy and chronic microvascular ischemic change. Electronically Signed   By: Drusilla Kanner M.D.   On: 02/22/2019 23:38   Ct L-spine No Charge  Result Date: 02/23/2019 CLINICAL DATA:  85 y/o M; Back pain, no red flags, initial exam Abd pain, acute, generalized. EXAM: CT ABDOMEN AND PELVIS WITHOUT CONTRAST CT LUMBAR SPINE WITHOUT  CONTRAST TECHNIQUE: Multidetector CT imaging of the abdomen and pelvis was performed following the standard protocol without IV contrast. Multidetector CT imaging of the lumbar spine was performed without intravenous contrast administration. Multiplanar CT image reconstructions were also generated. COMPARISON:  None. FINDINGS: Abdomen and pelvis: Lower chest: Pacemaker leads and severe coronary artery calcific atherosclerosis. Hepatobiliary: No focal liver abnormality is seen. No gallstones, gallbladder wall thickening, or biliary dilatation. Pancreas: Diffuse pancreatic compatible with chronic pancreatitis. No peripancreatic inflammation or main duct dilatation. Spleen: Scattered calcified granulomata. Adrenals/Urinary Tract: Normal adrenal glands. Left kidney upper pole cyst measuring 26 mm. No additional kidney lesion or urinary stone disease. No hydronephrosis. The bladder is collapsed around a Foley catheter. Stomach/Bowel: Stomach is within normal limits. Appendix appears normal. No evidence of bowel wall thickening, distention, or inflammatory changes. Vascular/Lymphatic: Aortic atherosclerosis. No enlarged abdominal or pelvic lymph nodes. Reproductive: Prosthetic calcification. Other: No abdominal wall hernia or abnormality. No abdominopelvic ascites. Musculoskeletal: No fracture is seen. Lumbar spine: Segmentation: 5 lumbar type vertebrae. Alignment: Mild-to-moderate lumbar levocurvature with apex at L2. Straightening of lumbar lordosis without listhesis. Vertebrae: No acute fracture or focal pathologic process. Large superior endplate Schmorl's nodes at L3 and L4. Paraspinal and other soft tissues: As above. Disc levels: Advanced lumbar spondylosis with moderate to severe loss of intervertebral disc space height throughout the lumbar spine greatest at the L4-5 level where there is vacuum phenomenon. Multilevel large endplate marginal osteophytes. Lumbar facet arthrosis greatest at the L3-L5 levels.  Endplate marginal osteophytes and facet hypertrophy result in multilevel neural foraminal stenosis greatest at  the left L4-5 level where it is moderate to severe. Additionally, there are multiple levels of spinal canal stenosis greatest at the L3-4 level where it is severe. IMPRESSION: 1. No acute process identified. 2. Advanced lumbar spondylosis greatest at L3-4 where there is severe spinal canal stenosis. 3. Coronary artery and aortic calcific atherosclerosis. 4. Findings of chronic pancreatitis. Electronically Signed   By: Mitzi Hansen M.D.   On: 02/23/2019 02:52    Procedures Procedures (including critical care time)  Medications Ordered in ED Medications - No data to display   Initial Impression / Assessment and Plan / ED Course  I have reviewed the triage vital signs and the nursing notes.  Pertinent labs & imaging results that were available during my care of the patient were reviewed by me and considered in my medical decision making (see chart for details).  Clinical Course as of Feb 23 747  Sat Feb 23, 2019  0303 Severe canal stenosis  CT L-SPINE NO CHARGE [EH]  0320 Spoke with neurosurgery PA who will consult with her attending.   [EH]  [phone] Spoke with neurosurgery PA who says that Dr. Dutch Quint has reviewed the images, does not feel that this is acute, recommends outpatient neurosurgical follow-up.  Does not feel that the changes are related to his urinary retention.   [EH]  0627 Kent Nolan [phone] Is patient's son.  He is the primary medical contact according to his granddaughter.  Dated him on plan.   [EH]  [phone] RN reports Bps have been running low.  Patient has been sleeping.  Ordered finger glucose and asked to wake patient up and see what his BP does.    [EH]    Clinical Course User Index [EH] Cristina Gong, PA-C      Patient presents today for evaluation of abdominal pain and concern for UTI with dark urine and pain with urination.  On  physical exam he had significant dermatitis in the genital area.  He had bladder scan performed showing over 500 mL's of urine and was unable to urinate therefore Foley catheter was placed.  Urine has blood however no evidence of infection.  Labs are obtained and reviewed, appear consistent with his baseline.  His BNP is slightly elevated at 416 however this also appears with baseline.  Based on his reported confusion yesterday CT head was obtained without evidence of acute abnormalities.  He is ANO x4.  Chest x-ray was obtained due to the amount of ecchymosis from his left humeral fracture without evidence of abnormality.  CT scan abdomen pelvis was obtained showing multiple bulging disks with spinal canal stenosis.  Concern for canal stenosis with new urinary retention I spoke with neurosurgery PA who spoke with Dr. Dutch Quint who states that this is reportedly not acute and does not require emergent evaluation, does not feel this is the cause of urinary retention.  CT abdomen pelvis without evidence of other abnormalities.  Based on patient's combination of left humerus fracture, new urinary retention with Foley catheter placement I spoke with family who states that they have been trying to get PT and OT or placement for patient however have not been successful.  Patient will board in the ER for PT OT consult in the morning. This patient was seen as a shared visit with Dr. Clayborne Dana.   At shift change care was transferred to Kent Melia PA-C who will follow pending studies, re-evaulate and determine disposition.     Final Clinical Impressions(s) / ED Diagnoses  Final diagnoses:  Back pain  Urinary retention  Weakness  Closed fracture of proximal end of left humerus with routine healing, unspecified fracture morphology, subsequent encounter  Bruising  Diaper dermatitis    ED Discharge Orders    None       Cristina GongHammond, Wilba Mutz W, Cordelia Poche-C 02/23/19 [id number]    Marily MemosMesner, Jason, MD 02/24/19 1045

## 2019-02-22 NOTE — ED Triage Notes (Signed)
Pt transferred from home via [facility].  Pt wife said, "starting on Tuesday pt able to urinate just a little, it's painful, dark in color, unusual odor. He was altered yesterday. Talking nonsense."  Pt broke his left humerus within last 2 weeks. Arm is bruised. Yesterday, pt saw his GP. GP gaven hydrocodone for pain in arm. Legs are swollen more than normal. GP did blood work for c/o UTI.  Has pacemaker, bedsores, doesn't walk, and hard of hearing.  Currently, he is AOx4. He has moments of being altered and AOx4.

## 2019-02-22 NOTE — ED Notes (Signed)
Patient transported to CT 

## 2019-02-22 NOTE — Telephone Encounter (Signed)
-----   Message from Biagio Borg, MD sent at 02/21/2019  8:32 PM EDT ----- Left message on MyChart, pt to cont same tx except  The test results show that your current treatment is OK, as the tests are stable except for a persistent mild low iron level.  We should take Nu-iron 1 per day for 3 months.Redmond Baseman to please inform pt, I will do rx

## 2019-02-22 NOTE — ED Notes (Signed)
Bed: WA09 Expected date:  Expected time:  Means of arrival:  Comments: EMS-dysuria 

## 2019-02-22 NOTE — Telephone Encounter (Signed)
Pt's wife, Hoyle Sauer, has been informed of lab results and expressed understanding.

## 2019-02-23 ENCOUNTER — Emergency Department (HOSPITAL_COMMUNITY): Payer: PPO

## 2019-02-23 ENCOUNTER — Encounter: Payer: Self-pay | Admitting: Internal Medicine

## 2019-02-23 DIAGNOSIS — M48061 Spinal stenosis, lumbar region without neurogenic claudication: Secondary | ICD-10-CM | POA: Diagnosis not present

## 2019-02-23 DIAGNOSIS — N281 Cyst of kidney, acquired: Secondary | ICD-10-CM | POA: Diagnosis not present

## 2019-02-23 DIAGNOSIS — L89309 Pressure ulcer of unspecified buttock, unspecified stage: Secondary | ICD-10-CM | POA: Insufficient documentation

## 2019-02-23 DIAGNOSIS — M47816 Spondylosis without myelopathy or radiculopathy, lumbar region: Secondary | ICD-10-CM | POA: Diagnosis not present

## 2019-02-23 DIAGNOSIS — J9811 Atelectasis: Secondary | ICD-10-CM | POA: Diagnosis not present

## 2019-02-23 DIAGNOSIS — R918 Other nonspecific abnormal finding of lung field: Secondary | ICD-10-CM | POA: Diagnosis not present

## 2019-02-23 LAB — CBC WITH DIFFERENTIAL/PLATELET
Abs Immature Granulocytes: 0.05 10*3/uL (ref 0.00–0.07)
Basophils Absolute: 0 10*3/uL (ref 0.0–0.1)
Basophils Relative: 0 %
Eosinophils Absolute: 0.1 10*3/uL (ref 0.0–0.5)
Eosinophils Relative: 2 %
HCT: 36.9 % — ABNORMAL LOW (ref 39.0–52.0)
Hemoglobin: 11.7 g/dL — ABNORMAL LOW (ref 13.0–17.0)
Immature Granulocytes: 1 %
Lymphocytes Relative: 8 %
Lymphs Abs: 0.7 10*3/uL (ref 0.7–4.0)
MCH: 31.8 pg (ref 26.0–34.0)
MCHC: 31.7 g/dL (ref 30.0–36.0)
MCV: 100.3 fL — ABNORMAL HIGH (ref 80.0–100.0)
Monocytes Absolute: 0.5 10*3/uL (ref 0.1–1.0)
Monocytes Relative: 6 %
Neutro Abs: 7 10*3/uL (ref 1.7–7.7)
Neutrophils Relative %: 83 %
Platelets: 222 10*3/uL (ref 150–400)
RBC: 3.68 MIL/uL — ABNORMAL LOW (ref 4.22–5.81)
RDW: 13.7 % (ref 11.5–15.5)
WBC: 8.4 10*3/uL (ref 4.0–10.5)
nRBC: 0 % (ref 0.0–0.2)

## 2019-02-23 LAB — URINALYSIS, ROUTINE W REFLEX MICROSCOPIC
Bilirubin Urine: NEGATIVE
Glucose, UA: NEGATIVE mg/dL
Ketones, ur: NEGATIVE mg/dL
Leukocytes,Ua: NEGATIVE
Nitrite: NEGATIVE
Protein, ur: NEGATIVE mg/dL
Specific Gravity, Urine: 1.02 (ref 1.005–1.030)
pH: 5 (ref 5.0–8.0)

## 2019-02-23 LAB — COMPREHENSIVE METABOLIC PANEL
ALT: 27 U/L (ref 0–44)
AST: 42 U/L — ABNORMAL HIGH (ref 15–41)
Albumin: 3.2 g/dL — ABNORMAL LOW (ref 3.5–5.0)
Alkaline Phosphatase: 69 U/L (ref 38–126)
Anion gap: 10 (ref 5–15)
BUN: 55 mg/dL — ABNORMAL HIGH (ref 8–23)
CO2: 26 mmol/L (ref 22–32)
Calcium: 9.3 mg/dL (ref 8.9–10.3)
Chloride: 101 mmol/L (ref 98–111)
Creatinine, Ser: 1.69 mg/dL — ABNORMAL HIGH (ref 0.61–1.24)
GFR calc Af Amer: 41 mL/min — ABNORMAL LOW (ref >60)
GFR calc non Af Amer: 36 mL/min — ABNORMAL LOW (ref >60)
Glucose, Bld: 142 mg/dL — ABNORMAL HIGH (ref 70–99)
Potassium: 4.1 mmol/L (ref 3.5–5.1)
Sodium: 137 mmol/L (ref 135–145)
Total Bilirubin: 2.3 mg/dL — ABNORMAL HIGH (ref 0.3–1.2)
Total Protein: 7.1 g/dL (ref 6.5–8.1)

## 2019-02-23 LAB — CBG MONITORING, ED: Glucose-Capillary: 85 mg/dL (ref 70–99)

## 2019-02-23 LAB — BRAIN NATRIURETIC PEPTIDE: B Natriuretic Peptide: 416.7 pg/mL — ABNORMAL HIGH (ref 0.0–100.0)

## 2019-02-23 MED ORDER — GLIPIZIDE ER 2.5 MG PO TB24
2.5000 mg | ORAL_TABLET | Freq: Every day | ORAL | Status: DC
  Administered 2019-02-23: 2.5 mg via ORAL
  Filled 2019-02-23: qty 1

## 2019-02-23 MED ORDER — HYDROCODONE-ACETAMINOPHEN 5-325 MG PO TABS: 1.0000 | ORAL_TABLET | Freq: Four times a day (QID) | ORAL | Status: DC | PRN

## 2019-02-23 MED ORDER — APIXABAN 2.5 MG PO TABS
2.5000 mg | ORAL_TABLET | Freq: Two times a day (BID) | ORAL | Status: DC
  Administered 2019-02-23: 12:00:00 2.5 mg via ORAL
  Filled 2019-02-23: qty 1

## 2019-02-23 MED ORDER — AMIODARONE HCL 100 MG PO TABS
100.0000 mg | ORAL_TABLET | Freq: Every day | ORAL | Status: DC
  Administered 2019-02-23: 10:00:00 100 mg via ORAL
  Filled 2019-02-23: qty 1

## 2019-02-23 MED ORDER — ASPIRIN EC 81 MG PO TBEC
81.0000 mg | DELAYED_RELEASE_TABLET | Freq: Every day | ORAL | Status: DC
  Administered 2019-02-23: 10:00:00 81 mg via ORAL
  Filled 2019-02-23: qty 1

## 2019-02-23 MED ORDER — FAMOTIDINE 20 MG PO TABS
40.0000 mg | ORAL_TABLET | Freq: Every day | ORAL | Status: DC
  Administered 2019-02-23: 40 mg via ORAL
  Filled 2019-02-23: qty 2

## 2019-02-23 MED ORDER — ACETAMINOPHEN 500 MG PO TABS: 500.0000 mg | ORAL_TABLET | Freq: Four times a day (QID) | ORAL | Status: DC | PRN

## 2019-02-23 MED ORDER — ROSUVASTATIN CALCIUM 20 MG PO TABS
20.0000 mg | ORAL_TABLET | Freq: Every day | ORAL | Status: DC
  Administered 2019-02-23: 10:00:00 20 mg via ORAL
  Filled 2019-02-23: qty 1

## 2019-02-23 NOTE — Progress Notes (Signed)
    Home health agencies that serve [phone].        [place] Quality of Patient Care Rating Patient Survey Summary Rating  [facility] [phone] 3 out of 5 stars 4 out of [place] [phone] 4  out of 5 stars 3 out of [place] [phone] 4 out of 5 stars 4 out of [place] [phone] 4  out of 5 stars 4 out of [place] [phone] 4 out of 5 stars 4 out of [place] [phone] 4 out of 5 stars 4 out of 5 stars  [facility] [place] [phone] 3  out of 5 stars 4 out of [place] [phone] 3 out of 5 stars 4 out of 5 stars  [facility] [phone] 4 out of 5 stars Not Available12  [facility] [phone] 3  out of 5 stars 3 out of [place] [phone] 3  out of 5 stars 4 out of [place] [phone] 3  out of 5 stars 3 out of [place] [phone] 3  out of 5 stars Not [place] [phone] 4  out of 5 stars 3 out of [place] number Footnote as displayed on [place]  1 This agency provides services under a federal waiver program to non-traditional, chronic long term population.  2 This agency provides services to a special needs population.  3 Not Available.  4 The number of patient episodes for this measure is too small to report.  5 This measure currently does not have data or provider has been certified/recertified for less than 6 months.  6 The national average for this measure is not provided because of state-to-state differences in data collection.  7 Medicare is not displaying rates for this  measure for any home health agency, because of an issue with the data.  8 There were problems with the data and they are being corrected.  9 Zero, or very few, patients met the survey's rules for inclusion. The scores shown, if any, reflect a very small number of surveys and may not accurately tell how an agency is doing.  10 Survey results are based on less than 12 months of data.  11 Fewer than 70 patients completed the survey. Use the scores shown, if any, with caution as the number of surveys may be too low to accurately tell how an agency is doing.  12 No survey results are available for this period.  13 Data suppressed by CMS for one or more quarters.

## 2019-02-23 NOTE — ED Provider Notes (Addendum)
  Physical Exam  BP (!) 99/50 (BP Location: Right Arm)   Pulse 69   Temp 99.1 F (37.3 C) (Oral)   Resp 17   Ht 5\' 7"  (1.702 m)   Wt 81.6 kg   SpO2 97%   BMI 28.19 kg/m   Physical Exam  ED Course/Procedures   Clinical Course as of Feb 22 1538  Sat Feb 23, 2019  0303 Severe canal stenosis  CT L-SPINE NO CHARGE [EH]  0320 Spoke with neurosurgery PA who will consult with her attending.   [EH]  [phone] Spoke with neurosurgery PA who says that Dr. Trenton Gammon has reviewed the images, does not feel that this is acute, recommends outpatient neurosurgical follow-up.  Does not feel that the changes are related to his urinary retention.   [EH]  0627 Era Skeen [phone] Is patient's son.  He is the primary medical contact according to his granddaughter.  Dated him on plan.   [EH]  [phone] RN reports Bps have been running low.  Patient has been sleeping.  Ordered finger glucose and asked to wake patient up and see what his BP does.    [EH]    Clinical Course User Index [EH] Lorin Glass, PA-C    Procedures Patient has seen PT OT that recommended inpatient treatment, however family no longer wants skilled nursing placement.  Will require wheelchair to be able to manage at home. Patient cannot use a walker or Rollator due to humerus fracture.  Needs the wheelchair for mobilization.      Davonna Belling, MD 02/23/19 1540    Davonna Belling, MD 02/23/19 [phone]

## 2019-02-23 NOTE — Assessment & Plan Note (Signed)
Early mild, for wound consult with [facility]

## 2019-02-23 NOTE — Assessment & Plan Note (Signed)
Suspect venous insufficiency, for BNP with labs

## 2019-02-23 NOTE — Assessment & Plan Note (Addendum)
For pain control - hydrocodone prn, d/c tramadol, f/u ortho as planned  In addition to the time spent performing CPE, I spent an additional 40 minutes face to face,in which greater than 50% of this time was spent in counseling and coordination of care for patient's acute illness as documented, including the differential dx, treatment, further evaluation and other management of left humeral fx, stroke, gait d/o, bruising, CKD, edema, LBP acute on chronic, bed sores

## 2019-02-23 NOTE — ED Notes (Signed)
Patient eating well and drinking fluids.  Taking medications without difficulty.

## 2019-02-23 NOTE — Assessment & Plan Note (Signed)
Post fall, for pain control, for plain films r/o fx

## 2019-02-23 NOTE — ED Notes (Addendum)
Patient arrived from room 9 to room 31.  Patient assisted to bed from stretcher and made comfortable.  Tech and sitter at bedside assisting patient with meal set up.  Belongings placed in locker 31.

## 2019-02-23 NOTE — NC FL2 (Signed)
[place] MEDICAID FL2 LEVEL OF CARE SCREENING TOOL     IDENTIFICATION  Patient Name: Kent Nolan Birthdate: 1929-02-06 Sex: male Admission Date (Current Location): 02/22/2019  [facility] [place]   Facility and Address:  [facility],  [address], [place] [id number]      Provider Number: [id number]  Attending Physician Name and Address:  Raeford RazorKohut, Stephen, MD  Relative Name and Phone Number:  Wife, Danne HarborCarolyn Madry   [phone])  Current Level of Care: Hospital Recommended Level of Care: Skilled Nursing Facility Prior Approval Number:    Date Approved/Denied:   PASRR Number: [id number]  Discharge Plan: SNF    Current Diagnoses: Patient Active Problem List   Diagnosis Date Noted  . Left humeral fracture 02/21/2019  . Bruising 02/21/2019  . Peripheral edema 02/21/2019  . Fall 02/15/2019  . Allergic conjunctivitis 08/14/2018  . Gait disturbance, post-stroke   . Cognitive deficit, post-stroke   . Stroke (cerebrum) (HCC) 10/23/2017  . 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 Lower back pain 03/30/2014  . Encounter for well adult exam with abnormal findings 11/26/2013  . Diabetes (HCC) 11/26/2013  . Coronary artery disease   . PAF (paroxysmal atrial fibrillation) (HCC)   . GERD (gastroesophageal reflux disease)   . Hyperlipidemia   . Chronic kidney disease   . Pacemaker   . Essential hypertension, benign 08/04/2010  . Sick sinus syndrome (HCC) 08/04/2010    Orientation RESPIRATION BLADDER Height & Weight     Self, Time, Situation, Place  Normal External catheter Weight: 81.6 kg Height:  5\' 7"  (170.2 cm)  BEHAVIORAL SYMPTOMS/MOOD NEUROLOGICAL BOWEL NUTRITION STATUS      Continent Diet  AMBULATORY STATUS COMMUNICATION OF NEEDS Skin   Extensive Assist Verbally Bruising                       Personal Care Assistance Level of Assistance  Bathing, Dressing, Feeding,  Total care Bathing Assistance: Maximum assistance Feeding assistance: Limited assistance Dressing Assistance: Maximum assistance Total Care Assistance: Maximum assistance   Functional Limitations Info  Hearing   Hearing Info: Impaired(Has hearing aides)      SPECIAL CARE FACTORS FREQUENCY  PT (By licensed PT), OT (By licensed OT)     PT Frequency: 2x weekly OT Frequency: 3x weekly            Contractures Contractures Info: Not present    Additional Factors Info  Allergies, Code Status Code Status Info: Full Allergies Info: Penicillins, Sulfonamide Derivatives, Tramadol           Current Medications (02/23/2019):  This is the current hospital active medication list Current Facility-Administered Medications  Medication Dose Route Frequency Provider Last Rate Last Dose  . acetaminophen (TYLENOL) tablet 500 mg  500 mg Oral Q6H PRN Jeannie FendMurphy, Laura A, PA-C      . amiodarone (PACERONE) tablet 100 mg  100 mg Oral Daily Army MeliaMurphy, Laura A, PA-C   100 mg at 02/23/19 1027  . apixaban (ELIQUIS) tablet 2.5 mg  2.5 mg Oral BID Army MeliaMurphy, Laura A, PA-C   2.5 mg at 02/23/19 1156  . aspirin EC tablet 81 mg  81 mg Oral Daily Jeannie FendMurphy, Laura A, PA-C   81 mg at 02/23/19 1027  . famotidine (PEPCID) tablet 40 mg  40 mg Oral Daily Army MeliaMurphy, Laura A, PA-C   40 mg at 02/23/19 1028  . glipiZIDE (GLUCOTROL XL) 24 hr tablet 2.5 mg  2.5 mg Oral Q breakfast Tacy Learn, PA-C      . HYDROcodone-acetaminophen (NORCO/VICODIN) 5-325 MG per tablet 1 tablet  1 tablet Oral Q6H PRN Tacy Learn, PA-C      . rosuvastatin (CRESTOR) tablet 20 mg  20 mg Oral Daily Suella Broad A, PA-C   20 mg at 02/23/19 1027   Current Outpatient Medications  Medication Sig Dispense Refill  . acetaminophen (TYLENOL) 500 MG tablet Take 500 mg by mouth every 6 (six) hours as needed for moderate pain.    Marland Kitchen amiodarone (PACERONE) 200 MG tablet TAKE 1/2 TABLET BY MOUTH EVERY DAY (Patient taking differently: Take 100 mg by mouth daily.  TAKE 1/2 TABLET BY MOUTH EVERY DAY) 45 tablet 2  . aspirin EC 81 MG tablet Take 81 mg by mouth daily.    Marland Kitchen azelastine (OPTIVAR) 0.05 % ophthalmic solution Place 1 drop into both eyes 2 (two) times daily. (Patient taking differently: Place 1 drop into both eyes 2 (two) times daily as needed (dry eyes). ) 6 mL 12  . Cholecalciferol (VITAMIN D-3 PO) Take 1 tablet by mouth daily.    Marland Kitchen ELIQUIS 2.5 MG TABS tablet TAKE 1 TABLET BY MOUTH TWICE A DAY (Patient taking differently: Take 2.5 mg by mouth 2 (two) times daily. ) 180 tablet 3  . famotidine (PEPCID) 40 MG tablet TAKE 1 TABLET BY MOUTH EVERY DAY (Patient taking differently: Take 40 mg by mouth daily. ) 90 tablet 3  . fenofibrate (TRICOR) 145 MG tablet TAKE 1 TABLET BY MOUTH EVERY DAY (Patient taking differently: Take 145 mg by mouth daily. ) 90 tablet 3  . glipiZIDE (GLUCOTROL XL) 2.5 MG 24 hr tablet Take 1 tablet (2.5 mg total) by mouth daily with breakfast. 90 tablet 3  . HYDROcodone-acetaminophen (NORCO/VICODIN) 5-325 MG tablet Take 1 tablet by mouth every 6 (six) hours as needed for moderate pain. 30 tablet 0  . Multiple Vitamin (MULTIVITAMIN) tablet Take 1 tablet by mouth daily.      . rosuvastatin (CRESTOR) 20 MG tablet Take 1 tablet (20 mg total) by mouth daily. 90 tablet 3  . Blood Glucose Monitoring Suppl (FREESTYLE LITE) DEVI Use as directed once daily E11.9 1 each 0  . glucose blood (FREESTYLE LITE) test strip Use as instructed once daily E11.9 100 each 12  . iron polysaccharides (NU-IRON) 150 MG capsule Take 1 capsule (150 mg total) by mouth daily. (Patient not taking: Reported on 02/22/2019) 90 capsule 0  . Lancets MISC Use as directed once daily E11.9 100 each 12  . lidocaine (LIDODERM) 5 % Place 1 patch onto the skin daily. Remove & Discard patch within 12 hours or as directed by MD (Patient not taking: Reported on 02/22/2019) 60 patch 1  . nitroGLYCERIN (NITROSTAT) 0.4 MG SL tablet Place 1 tablet (0.4 mg total) under the tongue every 5  (five) minutes as needed. For chest pain 25 tablet 5     Discharge Medications: Please see discharge summary for a list of discharge medications.  Relevant Imaging Results:  Relevant Lab Results:   Additional Information Broken  left humerus  Joellen [place], LCSWA

## 2019-02-23 NOTE — Assessment & Plan Note (Signed)
stable overall by history and exam, recent data reviewed with pt, and pt to continue medical treatment as before,  to f/u any worsening symptoms or concerns  

## 2019-02-23 NOTE — Progress Notes (Signed)
CSW attempted to speak with patient following PT/OT consult and their recommendations. Patient had difficulty hearing and maintaining a conversation, he granted CSW verbal permission to call his wife to discuss options.  CSW spoke with wife, Firman Petrow [phone]) a second time. Earlier [facility] declined SNF, preferring the patient to discharge home with [facility].   Wife states her children are advocating for patient to discharge to a SNF. She has some concerns, but she is agreeable to CSW faxing out referrals. CSW explained that as patient is in the E.D., the first SNF to offer a bed will be selected. Wife verbalizes understanding, but notes that [facility] or a SNF near her residence in [place] would be most preferred.  Stephanie Acre, [place] Social Worker [phone]

## 2019-02-23 NOTE — Progress Notes (Addendum)
CSW spoke with wife to follow up with discharge planning. CSW staffed case with SW AD and patient's care needs may be long term care. It is also unclear if patient's insurance would cover the costs of a SNF, CSW shared this information with wife and inquired if wife still wishes to pursue a SNF placement, which may require private pay. CSW and wife reviewed options of: pursuing SNF placement from ED with possibility of private pay, discharging and pursuing SNF placement from home with possibility of private pay, or discharging home today with home health follow up- ideally with home health social work following should the patient need assistance with placement at a later time.  Wife is agreeable to patient discharging home with home health. Wife states she may need a wheelchair, as patient has a rollator and a walker at home. CSW updated RN Case Manager and EDP of discharge plan.  RN Case Manager is following up with Home Health orders and request for a wheelchair.  Patient will be discharged today to his family, CSW will call wife to update with Home Health and wheelchair updates.  Stephanie Acre, [place] Social Worker [phone]

## 2019-02-23 NOTE — ED Notes (Signed)
PT/OT currently at bedside.

## 2019-02-23 NOTE — Assessment & Plan Note (Signed)
Will need PT with [facility]

## 2019-02-23 NOTE — Assessment & Plan Note (Signed)

## 2019-02-23 NOTE — Evaluation (Signed)
Physical Therapy Evaluation Patient Details Name: Kent Nolan MRN: [id number] DOB: 12-21-1928 Today's Date: 02/23/2019   History of Present Illness  85 y.o. male with a past medical history of stroke, A. fib/bradycardia with pacer in place, CKD, CAD, diabetes, who presents today for evaluation of urinary difficulties and altered mental status. Pt had fracture of L humerus 2 weeks ago.  Clinical Impression  Pt admitted with above diagnosis. Pt currently with functional limitations due to the deficits listed below (see PT Problem List). Pt has been having daily falls per son in law. +2 max assist for bed mobility, +2 mod assist for sit to stand. Pt stood at edge of bed for ~90 seconds, standing tolerance limited by BLE fatigue. SNF recommended. Pt/son in law agreeable to SNF.  Pt will benefit from skilled PT to increase their independence and safety with mobility to allow discharge to the venue listed below.       Follow Up Recommendations SNF;Supervision/Assistance - 24 hour    Equipment Recommendations  None recommended by PT(defer to SNF)    Recommendations for Other Services       Precautions / Restrictions Precautions Precautions: Fall Precaution Comments: falling daily since fall on 02/15/19 Required Braces or Orthoses: Sling      Mobility  Bed Mobility Overal bed mobility: Needs Assistance Bed Mobility: Supine to Sit;Sit to Supine Rolling: Mod assist   Supine to sit: HOB elevated;+2 for physical assistance;Max assist Sit to supine: +2 for physical assistance;Max assist   General bed mobility comments: assist to raise trunk, advance LEs to EOB  Transfers Overall transfer level: Needs assistance Equipment used: 2 person hand held assist Transfers: Sit to/from Stand Sit to Stand: Mod assist;+2 physical assistance;From elevated surface         General transfer comment: posterior lean, pt stood ~90 seconds, tolerance limited by LE fatigue  Ambulation/Gait                 Stairs            Wheelchair Mobility    Modified Rankin (Stroke Patients Only)       Balance Overall balance assessment: History of Falls;Needs assistance Sitting-balance support: Feet unsupported;Bilateral upper extremity supported Sitting balance-Leahy Scale: Fair     Standing balance support: Single extremity supported Standing balance-Leahy Scale: Poor                               Pertinent Vitals/Pain Pain Assessment: Faces Faces Pain Scale: Hurts little more Pain Location: LUE Pain Descriptors / Indicators: Grimacing Pain Intervention(s): Limited activity within patient's tolerance;Monitored during session;Repositioned    Home Living Family/patient expects to be discharged to:: Skilled nursing facility                 Additional Comments: son in law helping daily with pt due to falls. wife is unable to help assit with falls. family does not feel they can continue to manage at current level    Prior Function Level of Independence: Needs assistance   Gait / Transfers Assistance Needed: stand pivot to rollator or guided falls to the floor  ADL's / Homemaking Assistance Needed: max (A)  Comments: son in law reports back hx and diffculty managing patient at this time due to falls daily and multilpe times     Hand Dominance   Dominant Hand: Right    Extremity/Trunk Assessment   Upper Extremity Assessment Upper Extremity Assessment: Defer  to OT evaluation LUE Deficits / Details: called [facility] regarding clarficiation for L UE. Dr Shon BatonBrooks returning call and unavailable to access chart. Dr Shon BatonBrooks requesting patient remain in sling and family to call office on Monday to get clarification from Dr Aundria Rudogers direclty. OT advising use of sling to RN staff at this time pending further clarification . Edema management is recommended.  LUE Coordination: decreased fine motor    Lower Extremity Assessment Lower Extremity Assessment:  Generalized weakness(pitting edema B ankles, knee ext -4/5)    Cervical / Trunk Assessment Cervical / Trunk Assessment: Normal  Communication   Communication: HOH  Cognition Arousal/Alertness: Awake/alert Behavior During Therapy: WFL for tasks assessed/performed Overall Cognitive Status: Within Functional Limits for tasks assessed                                 General Comments: family present reports he is at his baseline      General Comments      Exercises     Assessment/Plan    PT Assessment Patient needs continued PT services  PT Problem List Decreased strength;Decreased activity tolerance;Decreased balance;Decreased mobility;Pain       PT Treatment Interventions DME instruction;Gait training;Therapeutic exercise;Therapeutic activities;Balance training;Patient/family education    PT Goals (Current goals can be found in the Care Plan section)  Acute Rehab PT Goals Patient Stated Goal: pt/son in law agreeable to SNF PT Goal Formulation: With patient/family Time For Goal Achievement: 03/08/19 Potential to Achieve Goals: Fair    Frequency Min 2X/week   Barriers to discharge        Co-evaluation PT/OT/SLP Co-Evaluation/Treatment: Yes Reason for Co-Treatment: Complexity of the patient's impairments (multi-system involvement);For patient/therapist safety;To address functional/ADL transfers PT goals addressed during session: Mobility/safety with mobility;Balance OT goals addressed during session: ADL's and self-care;Strengthening/ROM;Proper use of Adaptive equipment and DME       AM-PAC PT "6 Clicks" Mobility  Outcome Measure Help needed turning from your back to your side while in a flat bed without using bedrails?: A Lot Help needed moving from lying on your back to sitting on the side of a flat bed without using bedrails?: A Lot Help needed moving to and from a bed to a chair (including a wheelchair)?: A Lot Help needed standing up from a chair  using your arms (e.g., wheelchair or bedside chair)?: A Lot Help needed to walk in hospital room?: Total Help needed climbing 3-5 steps with a railing? : Total 6 Click Score: 10    End of Session Equipment Utilized During Treatment: Gait belt Activity Tolerance: Patient tolerated treatment well Patient left: in bed;with call bell/phone within reach;with family/visitor present Nurse Communication: Mobility status PT Visit Diagnosis: Unsteadiness on feet (R26.81);History of falling (Z91.81);Difficulty in walking, not elsewhere classified (R26.2);Pain Pain - Right/Left: Left Pain - part of body: Shoulder    Time: 1030-1103 PT Time Calculation (min) (ACUTE ONLY): 33 min   Charges:   PT Evaluation $PT Eval Moderate Complexity: 1 Mod          Tamala SerUhlenberg, Doil Kamara Kistler PT 02/23/2019  Acute Rehabilitation Services Pager [phone] Office [phone]

## 2019-02-23 NOTE — ED Notes (Signed)
Report given to TCU RN, pt to move here until placement.

## 2019-02-23 NOTE — Assessment & Plan Note (Signed)
Severe bruising noted near entire LUE s/p fracture; for INR and f/u cbc but no other specific tx needed

## 2019-02-23 NOTE — Clinical Social Work Note (Signed)
Clinical Social Work Assessment  Patient Details  Name: Kent GammaWilliam P Nolan MRN: [id number] Date of Birth: 20-Feb-1929  Date of referral:  02/23/19               Reason for consult:  Facility Placement, Care Management Concerns                Permission sought to share information with:  Case Manager, Family Supports, Magazine features editoracility Contact Representative Permission granted to share information::  Yes, Verbal Permission Granted  Name::        Agency::     Relationship::  Wife, Kent HarborCarolyn Nolan [phone])   Contact Information:     Housing/Transportation Living arrangements for the past 2 months:  Single Family Home Source of Information:  Spouse(Patient is hard of hearing and granted CSW permission to speak with wife) Patient Interpreter Needed:  None Criminal Activity/Legal Involvement Pertinent to Current Situation/Hospitalization:  No - Comment as needed Significant Relationships:  Adult Children, Spouse Lives with:  Spouse Do you feel safe going back to the place where you live?  Yes(Family has safety concerns regarding falls) Need for family participation in patient care:  Yes (Comment) Patient is dependent upon family for care and ADLs.  Care giving concerns:   85 y.o. male with a past medical history of stroke, A. fib/bradycardia with pacer in place, CKD, CAD, diabetes, who presents today for evaluation of urinary difficulties and altered mental status. Pt had fracture of L humerus 2 weeks ago. Multiple falls in home.  Social Worker assessment / plan:   CSW attempted to speak with patient following PT/OT consult and their recommendations. Patient had difficulty hearing and maintaining a conversation, he granted CSW verbal permission to call his wife to discuss options.   Employment status:  Retired Health and safety inspectornsurance information:  Other (Comment Required)(Health Doctor, hospitalTeam Advantage) PT Recommendations:  Skilled Nursing Facility Information / Referral to community resources:  Skilled Nursing  Facility  Patient/Family's Response to care:  Patient was discharged from the ED earlier this month. The plan was for patient to be established with Home Health, but wife reports these services were never started. The patient was reportedly fallen in the home daily since 06/12.  CSW spoke with wife, Kent Nolan [phone]) a second time. Earlier [facility] declined SNF, preferring the patient to [facility]   Wife states her children are advocating for patient to discharge to a SNF. She has some concerns, but she is agreeable to CSW faxing out referrals. CSW explained that as patient is in the E.D., the first SNF to offer a bed will be selected. Wife verbalizes understanding, but notes that [facility] or a SNF [place] in [place] would be most preferred.  Patient/Family's Understanding of and Emotional Response to Diagnosis, Current Treatment, and Prognosis:   Wife is concerned with patient discharging to a SNF and not being able to have in person visits with the patient. She acknowledges that she has difficulty caring for the patient and their children believe a SNF placement would be beneficial.  Emotional Assessment Appearance:  Appears stated age Attitude/Demeanor/Rapport:    Affect (typically observed):  Appropriate Orientation:  Oriented to Self, Oriented to Place, Oriented to  Time, Oriented to Situation Alcohol / Substance use:    Psych involvement (Current and /or in the community):  No (Comment)  Discharge Needs  Concerns to be addressed:  Discharge Planning Concerns, Home Safety Concerns Readmission within the last 30 days:  No Current discharge risk:  Dependent with  Mobility, Physical Impairment Barriers to Discharge:  Ship broker, Continued Medical Work up   [employer], [place] 02/23/2019, 1:02 PM

## 2019-02-23 NOTE — Evaluation (Signed)
Occupational Therapy Evaluation Patient Details Name: Kent Nolan MRN: [id number] DOB: 12/29/1928 Today's Date: 02/23/2019    History of Present Illness 85 y.o. male with a past medical history of stroke, A. fib/bradycardia with pacer in place, CKD, CAD, diabetes, who presents today for evaluation of urinary difficulties and altered mental status. Pt had fracture of L humerus 2 weeks ago.   Clinical Impression   PT admitted with AMS and urinary difficulties. Pt currently with functional limitiations due to the deficits listed below (see OT problem list). Pt currently total +2 max (A) for stand pivot transfers. Pt unable to advance R LE without (A) from therapist. Pt supine in bed with R side lying to relieve pain at buttock and prevent weight bearing on L UE. Pt with daily falls per family reports. Pt falling in PCP office yesterday with son in law helping in the bathroom.  Pt will benefit from skilled OT to increase their independence and safety with adls and balance to allow discharge SNF.     Follow Up Recommendations  SNF(pt agreeable)    Equipment Recommendations  3 in 1 bedside commode;Wheelchair (measurements OT);Wheelchair cushion (measurements OT);Hospital bed    Recommendations for Other Services PT consult;Other (comment)(ortho follow up since pt with daily FALLS)     Precautions / Restrictions Precautions Precautions: Fall Precaution Comments: falling daily since fall on 02/15/19 Required Braces or Orthoses: Sling Restrictions Weight Bearing Restrictions: Yes LUE Weight Bearing: Non weight bearing Other Position/Activity Restrictions: attempting to gain further clarification from [place] group      Mobility Bed Mobility Overal bed mobility: Needs Assistance Bed Mobility: Supine to Sit;Sit to Supine Rolling: Mod assist   Supine to sit: HOB elevated;+2 for physical assistance;Max assist Sit to supine: +2 for physical assistance;Max assist   General bed  mobility comments: assist to raise trunk, advance LEs to EOB  Transfers Overall transfer level: Needs assistance Equipment used: 2 person hand held assist Transfers: Sit to/from Stand Sit to Stand: Mod assist;+2 physical assistance;From elevated surface         General transfer comment: posterior lean, pt stood ~90 seconds, tolerance limited by LE fatigue    Balance Overall balance assessment: History of Falls;Needs assistance Sitting-balance support: Feet unsupported;Bilateral upper extremity supported Sitting balance-Leahy Scale: Fair     Standing balance support: Single extremity supported Standing balance-Leahy Scale: Poor                             ADL either performed or assessed with clinical judgement   ADL Overall ADL's : Needs assistance/impaired Eating/Feeding: Maximal assistance   Grooming: Maximal assistance   Upper Body Bathing: Maximal assistance   Lower Body Bathing: Total assistance   Upper Body Dressing : Maximal assistance   Lower Body Dressing: Total assistance   Toilet Transfer: +2 for physical assistance;+2 for safety/equipment;Maximal assistance Toilet Transfer Details (indicate cue type and reason): pt with strong posterior lean           General ADL Comments: pt with elevated BP demonstrates with transfer supine to sit. Pt SBP 127 to 200 with basic transfer to standing. Pt with poor activity tolerance and needing to turn to supine withing 3 minutes. Pt demonstrates edema in L UE and bil LE. Family reports much worse than baseline     Vision         Perception     Praxis      Pertinent Vitals/Pain Pain Assessment: Faces  Faces Pain Scale: Hurts little more Pain Location: LUE Pain Descriptors / Indicators: Grimacing Pain Intervention(s): Limited activity within patient's tolerance;Monitored during session;Repositioned     Hand Dominance Right   Extremity/Trunk Assessment Upper Extremity Assessment Upper Extremity  Assessment: Defer to OT evaluation LUE Deficits / Details: called [facility] regarding clarficiation for L UE. Dr Shon BatonBrooks returning call and unavailable to access chart. Dr Shon BatonBrooks requesting patient remain in sling and family to call office on Monday to get clarification from Dr Aundria Rudogers direclty. OT advising use of sling to RN staff at this time pending further clarification . Edema management is recommended.  LUE Coordination: decreased fine motor   Lower Extremity Assessment Lower Extremity Assessment: Generalized weakness(pitting edema B ankles, knee ext -4/5)   Cervical / Trunk Assessment Cervical / Trunk Assessment: Normal   Communication Communication Communication: HOH   Cognition Arousal/Alertness: Awake/alert Behavior During Therapy: WFL for tasks assessed/performed Overall Cognitive Status: Within Functional Limits for tasks assessed                                 General Comments: family present reports he is at his baseline   General Comments  L Ue edema present very dark in skin color from multiple falls. pt tolerating sling at this time. Pt notes pain at buttock on current ED bed. Pt positioned on R side with blankets and pillows for pressure relief    Exercises Exercises: Other exercises Other Exercises Other Exercises: pt educated on hand movement and elevation during session for edema control. pt with poor recall and will require OT follow up for L UE. Ot to continue to seek further clarification of L UE   Shoulder Instructions      Home Living Family/patient expects to be discharged to:: Skilled nursing facility                                 Additional Comments: son in law helping daily with pt due to falls. wife is unable to help assit with falls. family does not feel they can continue to manage at current level      Prior Functioning/Environment Level of Independence: Needs assistance  Gait / Transfers Assistance Needed: stand  pivot to rollator or guided falls to the floor ADL's / Homemaking Assistance Needed: max (A) Communication / Swallowing Assistance Needed: hoh wears hearing aides and only in R eye due to improper fit of L ear hearing aide Comments: son in law reports back hx and diffculty managing patient at this time due to falls daily and multilpe times        OT Problem List: Decreased strength;Decreased range of motion;Decreased activity tolerance;Impaired balance (sitting and/or standing);Decreased coordination;Decreased cognition;Decreased safety awareness;Decreased knowledge of use of DME or AE;Decreased knowledge of precautions;Cardiopulmonary status limiting activity;Obesity;Impaired UE functional use;Pain;Increased edema      OT Treatment/Interventions: Self-care/ADL training;Therapeutic exercise;Neuromuscular education;Energy conservation;DME and/or AE instruction;Manual therapy;Modalities;Therapeutic activities;Patient/family education;Balance training;Cognitive remediation/compensation    OT Goals(Current goals can be found in the care plan section) Acute Rehab OT Goals Patient Stated Goal: pt/son in law agreeable to SNF OT Goal Formulation: With patient Time For Goal Achievement: 03/09/19 Potential to Achieve Goals: Good  OT Frequency: Min 3X/week   Barriers to D/C: Decreased caregiver support          Co-evaluation PT/OT/SLP Co-Evaluation/Treatment: Yes Reason for Co-Treatment: Complexity of the patient's impairments (multi-system  involvement);For patient/therapist safety;To address functional/ADL transfers PT goals addressed during session: Mobility/safety with mobility;Balance OT goals addressed during session: ADL's and self-care;Strengthening/ROM;Proper use of [employer] equipment and DME      AM-PAC OT "6 Clicks" Daily Activity     Outcome Measure Help from another person eating meals?: A Lot Help from another person taking care of personal grooming?: A Lot Help from another  person toileting, which includes using toliet, bedpan, or urinal?: Total Help from another person bathing (including washing, rinsing, drying)?: Total Help from another person to put on and taking off regular upper body clothing?: A Lot Help from another person to put on and taking off regular lower body clothing?: Total 6 Click Score: 9   End of Session Equipment Utilized During Treatment: Gait belt Nurse Communication: Mobility status;Precautions;Weight bearing status  Activity Tolerance: Patient tolerated treatment well Patient left: in bed;with call bell/phone within reach;Other (comment)(bed rails up on bil Sides)  OT Visit Diagnosis: Unsteadiness on feet (R26.81);Muscle weakness (generalized) (M62.81);Repeated falls (R29.6)                Time: 1030-1103 OT Time Calculation (min): 33 min Charges:  OT General Charges $OT Visit: 1 Visit OT Evaluation $OT Eval Moderate Complexity: 1 Mod   Mateo FlowBrynn Sherryl Valido, OTR/L  Acute Rehabilitation Services Pager: [phone] Office: [phone] .   Mateo FlowBrynn Willadeen Colantuono 02/23/2019, 11:43 AM

## 2019-02-23 NOTE — ED Notes (Signed)
Attempted to contact granddaughter at [phone]. Left a message.

## 2019-02-23 NOTE — ED Provider Notes (Signed)
85yo male sent to the ER for possible UTI. Found to have severe diaper dermatitis. Needs PT/OT, social work eval, possible SNF placement. No UTI, was retaining urine, foley placed. Hard of hearing, hearing aid battery dead.  Physical Exam  BP 119/65   Pulse 67   Temp 99.7 F (37.6 C) (Rectal)   Resp 19   Ht 5\' 7"  (1.702 m)   Wt 81.6 kg   SpO2 98%   BMI 28.19 kg/m   Physical Exam  ED Course/Procedures   Clinical Course as of Feb 23 728  Sat Feb 23, 2019  0303 Severe canal stenosis  CT L-SPINE NO CHARGE [EH]  0320 Spoke with neurosurgery PA who will consult with her attending.   [EH]  [phone] Spoke with neurosurgery PA who says that Dr. Trenton Gammon has reviewed the images, does not feel that this is acute, recommends outpatient neurosurgical follow-up.  Does not feel that the changes are related to his urinary retention.   [EH]  0627 Era Skeen [phone] Is patient's son.  He is the primary medical contact according to his granddaughter.  Dated him on plan.   [EH]  [phone] RN reports Bps have been running low.  Patient has been sleeping.  Ordered finger glucose and asked to wake patient up and see what his BP does.    [EH]    Clinical Course User Index [EH] Lorin Glass, PA-C    Procedures  MDM  Home medications ordered as well as cardiac diet.  8:59 AM Patient sleeping, easily aroused with verbal stimuli and subsequently awake. Awaiting breakfast, requests cereal and coffee.  Call from SW who has spoke with family and would like to take patient home today.  Patient's son-in-law at bedside, discussed level of care needed at home; offered to have family member come to the ER to learn how to care for patient until home health is able to see the patient next week. Son-in-law will discuss with patient's wife and family, patient would like to go to SNF.  Patient evaluated by PT and OT who recommend SNF placement. Consult to SW requested to discuss placement options.   12:53PM  Reviewed SW notes, has spoken with family, plan is SNF placement, awaiting disposition.   3:00PM Care signed out to Dr. Alvino Chapel, ER attending, awaiting SNF placement.  3:10PM Call from SW, family will take patient home, awaiting wheelchair and family to come and learn skin care for patient.    Tacy Learn, PA-C 02/23/19 1511    Virgel Manifold, MD 02/24/19 [phone]

## 2019-02-23 NOTE — TOC Initial Note (Signed)
Transition of Care [facility]) - Initial/Assessment Note    Patient Details  Name: Kent Nolan MRN: [id number] Date of Birth: [birth year]  Transition of Care (TOC) CM/SW Contact:    Joaquin Courts, RN Phone Number: 02/23/2019, 4:16 PM  Clinical Narrative:         CM received consult for discharge planning. Spoke with spouse via telephone. HHPT/OT/RN/aide/social work arranged with Alvis Lemmings, rep [place] notified. DME wheelchair arranged with [employer] to be delivered to patient room for home use.           Expected Discharge Plan: [place] Barriers to Discharge: No Barriers Identified   Patient Goals and CMS Choice Patient states their goals for this hospitalization and ongoing recovery are:: to go home CMS Medicare.gov Compare Post Acute Care list provided to:: Patient(spouse) Choice offered to / list presented to : Patient, Spouse  Expected Discharge Plan and Services Expected Discharge Plan: [place]   Discharge Planning Services: CM Consult Post Acute Care Choice: [place] arrangements for the past 2 months: Single Family Home                 DME Arranged: High strength lightweight manual wheelchair with seat cushion DME Agency: [employer] Date DME Agency Contacted: 02/23/19 Time DME Agency Contacted: [phone] Representative spoke with at DME Agency: [place]: PT, OT, RN, Nurse's Aide, Social Work [employer] Agency: [place] Date [place]: 02/23/19 Time [place]: 89 Representative spoke with at [place]: [place] Arrangements/Services Living arrangements for the past 2 months: [place] Lives with:: Spouse Patient language and need for interpreter reviewed:: Yes Do you feel safe going back to the place where you live?: Yes      Need for Family Participation in Patient Care: Yes (Comment) Care giver support system in place?: Yes (comment)   Criminal Activity/Legal  Involvement Pertinent to Current Situation/Hospitalization: No - Comment as needed  Activities of Daily Living      Permission Sought/Granted                  Emotional Assessment Appearance:: Appears stated age Attitude/Demeanor/Rapport: Engaged Affect (typically observed): Accepting Orientation: : Oriented to Self, Oriented to Place, Oriented to  Time, Oriented to Situation   Psych Involvement: No (comment)  Admission diagnosis:  Dysuria  Patient Active Problem List   Diagnosis Date Noted  . Left humeral fracture 02/21/2019  . Bruising 02/21/2019  . Peripheral edema 02/21/2019  . Fall 02/15/2019  . Allergic conjunctivitis 08/14/2018  . Gait disturbance, post-stroke   . Cognitive deficit, post-stroke   . Stroke (cerebrum) ([place]) 10/23/2017  . 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 Lower back pain 03/30/2014  . Encounter for well adult exam with abnormal findings 11/26/2013  . Diabetes ([place]) 11/26/2013  . Coronary artery disease   . PAF (paroxysmal atrial fibrillation) ([place])   . GERD (gastroesophageal reflux disease)   . Hyperlipidemia   . Chronic kidney disease   . Pacemaker   . Essential hypertension, benign 08/04/2010  . Sick sinus syndrome ([place]) 08/04/2010   PCP:  Biagio Borg, MD Pharmacy:   [id number] [place], [address] [place] [address] Phone: [phone] Fax: [phone]     Social Determinants of Health (SDOH) Interventions    Readmission Risk Interventions No flowsheet data found.

## 2019-02-23 NOTE — Progress Notes (Addendum)
CSW acknowledging Social Work consult and following for discharge planning.  Patient is reportedly hard of hearing and has dead batteries in his hearing aides, CSW spoke with wife, Murray Durrell [phone]).  [name] and family prefer that the patient discharge home and are interested in home health services. Wife and patient decline SNF referrals and placement at this time. Wife shares that she would feel more comfortable with the patient being home and cites that there are family members that live nearby and are involved.  Per wife, the patient does not have home health services at this time.   CSW consulted Nurse Case Manager to follow up with [facility]. PT/OT consult pending.  CSW signing off. Please reconsult if additional social work needs arise.  Stephanie Acre, [place] Social Worker [phone]

## 2019-02-23 NOTE — Assessment & Plan Note (Signed)
With limited ADL ability, for [facility] with PT and RN, may need aide as well

## 2019-02-24 LAB — URINE CULTURE: Culture: NO GROWTH

## 2019-02-24 LAB — NOVEL CORONAVIRUS, NAA (HOSP ORDER, SEND-OUT TO REF LAB; TAT 18-24 HRS): SARS-CoV-2, NAA: NOT DETECTED

## 2019-02-26 ENCOUNTER — Telehealth: Payer: Self-pay

## 2019-02-26 DIAGNOSIS — M2578 Osteophyte, vertebrae: Secondary | ICD-10-CM | POA: Diagnosis not present

## 2019-02-26 DIAGNOSIS — I495 Sick sinus syndrome: Secondary | ICD-10-CM | POA: Diagnosis not present

## 2019-02-26 DIAGNOSIS — M170 Bilateral primary osteoarthritis of knee: Secondary | ICD-10-CM | POA: Diagnosis not present

## 2019-02-26 DIAGNOSIS — I69318 Other symptoms and signs involving cognitive functions following cerebral infarction: Secondary | ICD-10-CM | POA: Diagnosis not present

## 2019-02-26 DIAGNOSIS — S42222D 2-part displaced fracture of surgical neck of left humerus, subsequent encounter for fracture with routine healing: Secondary | ICD-10-CM | POA: Diagnosis not present

## 2019-02-26 DIAGNOSIS — J9811 Atelectasis: Secondary | ICD-10-CM | POA: Diagnosis not present

## 2019-02-26 DIAGNOSIS — N390 Urinary tract infection, site not specified: Secondary | ICD-10-CM | POA: Diagnosis not present

## 2019-02-26 DIAGNOSIS — Z466 Encounter for fitting and adjustment of urinary device: Secondary | ICD-10-CM | POA: Diagnosis not present

## 2019-02-26 DIAGNOSIS — I129 Hypertensive chronic kidney disease with stage 1 through stage 4 chronic kidney disease, or unspecified chronic kidney disease: Secondary | ICD-10-CM | POA: Diagnosis not present

## 2019-02-26 DIAGNOSIS — M4186 Other forms of scoliosis, lumbar region: Secondary | ICD-10-CM | POA: Diagnosis not present

## 2019-02-26 DIAGNOSIS — G319 Degenerative disease of nervous system, unspecified: Secondary | ICD-10-CM | POA: Diagnosis not present

## 2019-02-26 DIAGNOSIS — I69398 Other sequelae of cerebral infarction: Secondary | ICD-10-CM | POA: Diagnosis not present

## 2019-02-26 DIAGNOSIS — I480 Paroxysmal atrial fibrillation: Secondary | ICD-10-CM | POA: Diagnosis not present

## 2019-02-26 DIAGNOSIS — M5127 Other intervertebral disc displacement, lumbosacral region: Secondary | ICD-10-CM | POA: Diagnosis not present

## 2019-02-26 DIAGNOSIS — M5146 Schmorl's nodes, lumbar region: Secondary | ICD-10-CM | POA: Diagnosis not present

## 2019-02-26 DIAGNOSIS — L89312 Pressure ulcer of right buttock, stage 2: Secondary | ICD-10-CM | POA: Diagnosis not present

## 2019-02-26 DIAGNOSIS — R339 Retention of urine, unspecified: Secondary | ICD-10-CM | POA: Diagnosis not present

## 2019-02-26 DIAGNOSIS — E1122 Type 2 diabetes mellitus with diabetic chronic kidney disease: Secondary | ICD-10-CM | POA: Diagnosis not present

## 2019-02-26 DIAGNOSIS — R2689 Other abnormalities of gait and mobility: Secondary | ICD-10-CM | POA: Diagnosis not present

## 2019-02-26 DIAGNOSIS — M47816 Spondylosis without myelopathy or radiculopathy, lumbar region: Secondary | ICD-10-CM | POA: Diagnosis not present

## 2019-02-26 DIAGNOSIS — I2510 Atherosclerotic heart disease of native coronary artery without angina pectoris: Secondary | ICD-10-CM | POA: Diagnosis not present

## 2019-02-26 DIAGNOSIS — K861 Other chronic pancreatitis: Secondary | ICD-10-CM | POA: Diagnosis not present

## 2019-02-26 DIAGNOSIS — M48061 Spinal stenosis, lumbar region without neurogenic claudication: Secondary | ICD-10-CM | POA: Diagnosis not present

## 2019-02-26 DIAGNOSIS — M438X6 Other specified deforming dorsopathies, lumbar region: Secondary | ICD-10-CM | POA: Diagnosis not present

## 2019-02-26 DIAGNOSIS — N189 Chronic kidney disease, unspecified: Secondary | ICD-10-CM | POA: Diagnosis not present

## 2019-02-26 NOTE — Telephone Encounter (Signed)
6/19 urine culture negative, as well as normal wbc and stable renal function  Please consider other cause for worsening condition  Pt should go back to ED for fever, worsening pain or generalized weakness or falls or any other unusual symptoms

## 2019-02-26 NOTE — Telephone Encounter (Signed)
Copied from [place] [phone]. Topic: General - Call Back - No Documentation >> Feb 25, 2019 12:13 PM Erick Blinks wrote: [phone] Requesting call back from Nurse. Worried about possible UTI. Having difficulty maneuvering.

## 2019-02-26 NOTE — Telephone Encounter (Signed)
Pt's wife, Hoyle Sauer, has been informed and expressed understanding. She stated that home health/ physical therapy to come out to help him with gaining strength and weakness.

## 2019-02-27 ENCOUNTER — Telehealth: Payer: Self-pay | Admitting: Internal Medicine

## 2019-02-27 DIAGNOSIS — R350 Frequency of micturition: Secondary | ICD-10-CM

## 2019-02-27 NOTE — Telephone Encounter (Signed)
Langley Gauss called from [place] home health care stating she believe patient needs a urologist.   Langley Gauss also stated the daughter pulled denise aside to let her know she is not sure if the "mother" can help take care of patient anymore, so she is going to call a Education officer, museum.  Denise call back # [phone]

## 2019-02-27 NOTE — Telephone Encounter (Signed)
Pt need an order for a drop down arm rest commode as standard commode isn't appropriate    Home Health Verbal Orders - Caller/Agency: [facility] Callback Number: [phone] Requesting OT/PT/Skilled Nursing/Social Work/Speech Therapy: OT for ADL transfers Frequency: 1x 2w   0x 2w  1x 1w  0x 1w  1x 2w     Ok to receive orders from Dr [facility]

## 2019-02-27 NOTE — Telephone Encounter (Signed)
Verbal orders left on VM 

## 2019-02-28 ENCOUNTER — Encounter (HOSPITAL_COMMUNITY): Payer: Self-pay | Admitting: Emergency Medicine

## 2019-02-28 ENCOUNTER — Other Ambulatory Visit: Payer: Self-pay

## 2019-02-28 ENCOUNTER — Emergency Department (HOSPITAL_COMMUNITY)
Admission: EM | Admit: 2019-02-28 | Discharge: 2019-03-02 | Disposition: A | Discharge status: Other Acute Inpatient Hospital | Payer: PPO | Attending: Emergency Medicine | Admitting: Emergency Medicine

## 2019-02-28 ENCOUNTER — Other Ambulatory Visit: Payer: Self-pay | Admitting: *Deleted

## 2019-02-28 ENCOUNTER — Emergency Department (HOSPITAL_COMMUNITY): Payer: PPO

## 2019-02-28 DIAGNOSIS — Z03818 Encounter for observation for suspected exposure to other biological agents ruled out: Secondary | ICD-10-CM | POA: Diagnosis not present

## 2019-02-28 DIAGNOSIS — Z7901 Long term (current) use of anticoagulants: Secondary | ICD-10-CM | POA: Diagnosis not present

## 2019-02-28 DIAGNOSIS — N390 Urinary tract infection, site not specified: Secondary | ICD-10-CM | POA: Diagnosis not present

## 2019-02-28 DIAGNOSIS — R531 Weakness: Secondary | ICD-10-CM | POA: Diagnosis not present

## 2019-02-28 DIAGNOSIS — R0689 Other abnormalities of breathing: Secondary | ICD-10-CM | POA: Diagnosis not present

## 2019-02-28 DIAGNOSIS — E119 Type 2 diabetes mellitus without complications: Secondary | ICD-10-CM | POA: Diagnosis not present

## 2019-02-28 DIAGNOSIS — Z7982 Long term (current) use of aspirin: Secondary | ICD-10-CM | POA: Insufficient documentation

## 2019-02-28 DIAGNOSIS — R4182 Altered mental status, unspecified: Secondary | ICD-10-CM | POA: Diagnosis not present

## 2019-02-28 DIAGNOSIS — R000 Tachycardia, unspecified: Secondary | ICD-10-CM | POA: Diagnosis not present

## 2019-02-28 DIAGNOSIS — Z209 Contact with and (suspected) exposure to unspecified communicable disease: Secondary | ICD-10-CM | POA: Diagnosis not present

## 2019-02-28 DIAGNOSIS — J9811 Atelectasis: Secondary | ICD-10-CM | POA: Diagnosis not present

## 2019-02-28 DIAGNOSIS — R5383 Other fatigue: Secondary | ICD-10-CM

## 2019-02-28 DIAGNOSIS — Z7984 Long term (current) use of oral hypoglycemic drugs: Secondary | ICD-10-CM | POA: Diagnosis not present

## 2019-02-28 DIAGNOSIS — I2510 Atherosclerotic heart disease of native coronary artery without angina pectoris: Secondary | ICD-10-CM | POA: Insufficient documentation

## 2019-02-28 DIAGNOSIS — I959 Hypotension, unspecified: Secondary | ICD-10-CM | POA: Diagnosis not present

## 2019-02-28 DIAGNOSIS — Z87891 Personal history of nicotine dependence: Secondary | ICD-10-CM | POA: Insufficient documentation

## 2019-02-28 DIAGNOSIS — N189 Chronic kidney disease, unspecified: Secondary | ICD-10-CM | POA: Diagnosis not present

## 2019-02-28 DIAGNOSIS — Z8673 Personal history of transient ischemic attack (TIA), and cerebral infarction without residual deficits: Secondary | ICD-10-CM | POA: Insufficient documentation

## 2019-02-28 DIAGNOSIS — I129 Hypertensive chronic kidney disease with stage 1 through stage 4 chronic kidney disease, or unspecified chronic kidney disease: Secondary | ICD-10-CM | POA: Diagnosis not present

## 2019-02-28 DIAGNOSIS — Z950 Presence of cardiac pacemaker: Secondary | ICD-10-CM | POA: Diagnosis not present

## 2019-02-28 DIAGNOSIS — F321 Major depressive disorder, single episode, moderate: Secondary | ICD-10-CM | POA: Diagnosis not present

## 2019-02-28 LAB — URINALYSIS, ROUTINE W REFLEX MICROSCOPIC
Bilirubin Urine: NEGATIVE
Glucose, UA: NEGATIVE mg/dL
Ketones, ur: NEGATIVE mg/dL
Nitrite: NEGATIVE
Protein, ur: 30 mg/dL — AB
Specific Gravity, Urine: 1.014 (ref 1.005–1.030)
WBC, UA: >50 WBC/hpf — ABNORMAL HIGH (ref 0–5)
pH: 6 (ref 5.0–8.0)

## 2019-02-28 LAB — CBC WITH DIFFERENTIAL/PLATELET
Abs Immature Granulocytes: 0.04 10*3/uL (ref 0.00–0.07)
Basophils Absolute: 0 10*3/uL (ref 0.0–0.1)
Basophils Relative: 1 %
Eosinophils Absolute: 0.2 10*3/uL (ref 0.0–0.5)
Eosinophils Relative: 3 %
HCT: 37.4 % — ABNORMAL LOW (ref 39.0–52.0)
Hemoglobin: 11.1 g/dL — ABNORMAL LOW (ref 13.0–17.0)
Immature Granulocytes: 1 %
Lymphocytes Relative: 12 %
Lymphs Abs: 0.8 10*3/uL (ref 0.7–4.0)
MCH: 31 pg (ref 26.0–34.0)
MCHC: 29.7 g/dL — ABNORMAL LOW (ref 30.0–36.0)
MCV: 104.5 fL — ABNORMAL HIGH (ref 80.0–100.0)
Monocytes Absolute: 0.5 10*3/uL (ref 0.1–1.0)
Monocytes Relative: 8 %
Neutro Abs: 4.8 10*3/uL (ref 1.7–7.7)
Neutrophils Relative %: 75 %
Platelets: 191 10*3/uL (ref 150–400)
RBC: 3.58 MIL/uL — ABNORMAL LOW (ref 4.22–5.81)
RDW: 13.4 % (ref 11.5–15.5)
WBC: 6.3 10*3/uL (ref 4.0–10.5)
nRBC: 0 % (ref 0.0–0.2)

## 2019-02-28 LAB — PROTIME-INR
INR: 1.5 — ABNORMAL HIGH (ref 0.8–1.2)
Prothrombin Time: 17.7 seconds — ABNORMAL HIGH (ref 11.4–15.2)

## 2019-02-28 LAB — COMPREHENSIVE METABOLIC PANEL
ALT: 17 U/L (ref 0–44)
AST: 26 U/L (ref 15–41)
Albumin: 2.3 g/dL — ABNORMAL LOW (ref 3.5–5.0)
Alkaline Phosphatase: 72 U/L (ref 38–126)
Anion gap: 8 (ref 5–15)
BUN: 28 mg/dL — ABNORMAL HIGH (ref 8–23)
CO2: 20 mmol/L — ABNORMAL LOW (ref 22–32)
Calcium: 8.4 mg/dL — ABNORMAL LOW (ref 8.9–10.3)
Chloride: 108 mmol/L (ref 98–111)
Creatinine, Ser: 1.67 mg/dL — ABNORMAL HIGH (ref 0.61–1.24)
GFR calc Af Amer: 42 mL/min — ABNORMAL LOW (ref >60)
GFR calc non Af Amer: 36 mL/min — ABNORMAL LOW (ref >60)
Glucose, Bld: 76 mg/dL (ref 70–99)
Potassium: 4.3 mmol/L (ref 3.5–5.1)
Sodium: 136 mmol/L (ref 135–145)
Total Bilirubin: 1.2 mg/dL (ref 0.3–1.2)
Total Protein: 5.6 g/dL — ABNORMAL LOW (ref 6.5–8.1)

## 2019-02-28 LAB — LACTIC ACID, PLASMA: Lactic Acid, Venous: 1.2 mmol/L (ref 0.5–1.9)

## 2019-02-28 LAB — SARS CORONAVIRUS 2 BY RT PCR (HOSPITAL ORDER, PERFORMED IN ~~LOC~~ HOSPITAL LAB): SARS Coronavirus 2: NEGATIVE

## 2019-02-28 LAB — LIPASE, BLOOD: Lipase: 20 U/L (ref 11–51)

## 2019-02-28 MED ORDER — APIXABAN 2.5 MG PO TABS
2.5000 mg | ORAL_TABLET | Freq: Two times a day (BID) | ORAL | Status: DC
  Administered 2019-03-01 – 2019-03-02 (×3): 2.5 mg via ORAL
  Filled 2019-02-28 (×4): qty 1

## 2019-02-28 MED ORDER — ROSUVASTATIN CALCIUM 20 MG PO TABS
20.0000 mg | ORAL_TABLET | Freq: Every day | ORAL | Status: DC
  Administered 2019-03-01 – 2019-03-02 (×2): 20 mg via ORAL
  Filled 2019-02-28 (×2): qty 1

## 2019-02-28 MED ORDER — FAMOTIDINE 20 MG PO TABS
40.0000 mg | ORAL_TABLET | Freq: Every evening | ORAL | Status: DC
  Administered 2019-02-28 – 2019-03-01 (×2): 40 mg via ORAL
  Filled 2019-02-28 (×2): qty 2

## 2019-02-28 MED ORDER — KETOTIFEN FUMARATE 0.025 % OP SOLN
1.0000 [drp] | Freq: Two times a day (BID) | OPHTHALMIC | Status: DC | PRN
  Filled 2019-02-28: qty 5

## 2019-02-28 MED ORDER — LIDOCAINE 5 % EX PTCH
1.0000 | MEDICATED_PATCH | CUTANEOUS | Status: DC
  Administered 2019-02-28 – 2019-03-01 (×2): 1 via TRANSDERMAL
  Filled 2019-02-28 (×2): qty 1

## 2019-02-28 MED ORDER — SODIUM CHLORIDE 0.9 % IV BOLUS
500.0000 mL | Freq: Once | INTRAVENOUS | Status: AC
  Administered 2019-02-28: 500 mL via INTRAVENOUS

## 2019-02-28 MED ORDER — ASPIRIN EC 81 MG PO TBEC
81.0000 mg | DELAYED_RELEASE_TABLET | Freq: Every day | ORAL | Status: DC
  Administered 2019-03-01 – 2019-03-02 (×2): 81 mg via ORAL
  Filled 2019-02-28 (×2): qty 1

## 2019-02-28 MED ORDER — ACETAMINOPHEN 500 MG PO TABS
500.0000 mg | ORAL_TABLET | Freq: Four times a day (QID) | ORAL | Status: DC | PRN
  Administered 2019-03-01: 500 mg via ORAL
  Filled 2019-02-28: qty 1

## 2019-02-28 MED ORDER — SODIUM CHLORIDE 0.9 % IV SOLN
1.0000 g | Freq: Once | INTRAVENOUS | Status: AC
  Administered 2019-02-28: 1 g via INTRAVENOUS
  Filled 2019-02-28: qty 10

## 2019-02-28 MED ORDER — GLIPIZIDE ER 2.5 MG PO TB24
2.5000 mg | ORAL_TABLET | Freq: Every day | ORAL | Status: DC
  Administered 2019-03-01: 2.5 mg via ORAL
  Filled 2019-02-28 (×4): qty 1

## 2019-02-28 MED ORDER — HYDROCODONE-ACETAMINOPHEN 5-325 MG PO TABS
1.0000 | ORAL_TABLET | Freq: Four times a day (QID) | ORAL | Status: DC | PRN
  Administered 2019-02-28 – 2019-03-01 (×2): 1 via ORAL
  Filled 2019-02-28 (×2): qty 1

## 2019-02-28 MED ORDER — APIXABAN 2.5 MG PO TABS
2.5000 mg | ORAL_TABLET | Freq: Once | ORAL | Status: AC
  Administered 2019-02-28: 2.5 mg via ORAL
  Filled 2019-02-28: qty 1

## 2019-02-28 NOTE — BH Assessment (Signed)
Tele Assessment Note   Patient Name: Kent Nolan MRN: [id number] Referring Physician:  Location of Patient: [facility] Location of Provider: Behavioral Health TTS Department  Kent GammaWilliam P Nolan is an 85 y.o. male.  Patient had reportedly made a statement about "not wanting to do this anymore."  See CSW's notes about discussions of placement / services options that he had with spouse and other family members.  This clinician did talk with patient.  Patient is hard of hearing but was able to provide some information.  He said he was in pain and did not want to be a burden to family members.  He did say, "I would like to live a comfortable life."  When asked about suicidal thoughts he says "I would not commit suicide, I would just let God take me."   Clinician got permission to talk with patient's wife, Kent Nolan.  She said that they had been married for 66 years.  He has never made any statements about wanting to kill himself.  She said he may complain about pain and wishing he were not in pain and make a statement like 'I'd rather die."  She said he has never made any attempt to kill himself nor talked about it.  Patient has not made statements about wanting to harm anyone else either.  Patient has no A/V hallucinations.  Patient does appear to be depressed about not being able to do the things he used to be able to do.  Spouse said she does not worry about him trying to harm himself.  She was only concerned about him being depressed.  She wants him to be comfortable.    There is no previous inpatient or outpatient care history and no hx of substance abuse.  -Clinician discussed patient care with Nira ConnJason Berry, FNP.  He did not recommend inpatient psychiatric care for patient.  Recommended family continue to explore care options most appropriate to need.  Diagnosis: F32.1 MDD single episode moderate  Past Medical History:  Past Medical History:  Diagnosis Date  . BRADYCARDIA 08/04/2010   s/p PPM since 2011 - generator change 12/03/13  . Chronic kidney disease    followed by nephrology  . Coronary artery disease    remote subendo MI in February 2005 with stents to RCA &LCX; s/p scute thrombosis of both stents in February 2010 with cardiogenic shock - s/p BMS to both lesions; follow up cath in September 2010 was satisfactory.  He is medically managed with aspirin/Plavix  . GERD (gastroesophageal reflux disease)   . High risk medication use    on amiodarone for PAF/VT since February 2010  . HTN (hypertension)   . Hyperlipidemia   . Obesity   . Pacemaker   . PAF (paroxysmal atrial fibrillation) (HCC)    s/p cardioversion in August 2011  . Stroke (HCC)   . Type II or unspecified type diabetes mellitus without mention of complication, uncontrolled     Past Surgical History:  Procedure Laterality Date  . EP IMPLANTABLE DEVICE N/A 02/25/2015   Procedure: Lead Revision/Repair;  Surgeon: Hillis RangeJames Allred, MD;  [facility];  Service: Cardiovascular;  Laterality: N/A;  . IR CT HEAD LTD  10/23/2017  . IR PERCUTANEOUS ART THROMBECTOMY/INFUSION INTRACRANIAL INC DIAG ANGIO  10/23/2017  . PACEMAKER GENERATOR CHANGE  12/03/13   MDT Adapta L generator change for premature ERI by [name]  . PACEMAKER INSERTION  May 2011  . PERMANENT PACEMAKER GENERATOR CHANGE N/A 12/03/2013   Procedure: PERMANENT  PACEMAKER GENERATOR CHANGE;  Surgeon: Coralyn Mark, MD;  Location: [facility] CATH LAB;  Service: Cardiovascular;  Laterality: N/A;  . RADIOLOGY WITH ANESTHESIA N/A 10/23/2017   Procedure: RADIOLOGY WITH ANESTHESIA;  Surgeon: Luanne Bras, MD;  Location: [place];  Service: Radiology;  Laterality: N/A;    Family History:  Family History  Problem Relation Age of Onset  . Heart disease Mother   . Stomach cancer Father   . Diabetes Father     Social History:  reports that he has quit smoking. His smoking use included cigarettes. He smoked 3.00 packs per day. He has never used  smokeless tobacco. He reports that he does not drink alcohol or use drugs.  Additional Social History:  Alcohol / Drug Use Pain Medications: See PTA medication list Prescriptions: See PTA medication list Over the Counter: See PTA medication list. History of alcohol / drug use?: No history of alcohol / drug abuse  CIWA: CIWA-Ar BP: 123/68 Pulse Rate: 71 COWS:    Allergies:  Allergies  Allergen Reactions  . Penicillins Other (See Comments)    Childhood Allergy Has patient had a PCN reaction causing immediate rash, facial/tongue/throat swelling, SOB or lightheadedness with hypotension: Unknown Has patient had a PCN reaction causing severe rash involving mucus membranes or skin necrosis: Unknown Has patient had a PCN reaction that required hospitalization: Unknown Has patient had a PCN reaction occurring within the last 10 years: No If all of the above answers are "NO", then may proceed with Cephalosporin use.   . Sulfonamide Derivatives Other (See Comments)    Reaction unknown  . Tramadol Other (See Comments)    hallucinations    Home Medications: (Not in a hospital admission)   OB/GYN Status:  No LMP for male patient.  General Assessment Data Location of Assessment: [facility] ED TTS Assessment: In system Is this a Tele or Face-to-Face Assessment?: Tele Assessment Is this an Initial Assessment or a Re-assessment for this encounter?: Initial Assessment Patient Accompanied by:: N/A Language Other than English: No Living Arrangements: Other (Comment)(Pt lives with wife.  Extended family close by.) What gender do you identify as?: Male Marital status: Married Pregnancy Status: No Living Arrangements: Spouse/significant other Can pt return to current living arrangement?: (Unknown) Admission Status: Voluntary Is patient capable of signing voluntary admission?: No Referral Source: Self/Family/Friend Insurance type: [facility]     Crisis Care Plan Living Arrangements:  Spouse/significant other Name of Psychiatrist: None Name of Therapist: None  Education Status Is patient currently in school?: No Is the patient employed, unemployed or receiving disability?: Unemployed  Risk to self with the past 6 months Suicidal Ideation: No Has patient been a risk to self within the past 6 months prior to admission? : No Suicidal Intent: No Has patient had any suicidal intent within the past 6 months prior to admission? : No Is patient at risk for suicide?: No Suicidal Plan?: No Has patient had any suicidal plan within the past 6 months prior to admission? : No Access to Means: No What has been your use of drugs/alcohol within the last 12 months?: None Previous Attempts/Gestures: No How many times?: 0 Other Self Harm Risks: None Triggers for Past Attempts: None known Intentional Self Injurious Behavior: None Family Suicide History: No Recent stressful life event(s): Recent negative physical changes Persecutory voices/beliefs?: No Depression: Yes Depression Symptoms: Despondent Substance abuse history and/or treatment for substance abuse?: No Suicide prevention information given to non-admitted patients: Not applicable  Risk to Others within the past 6 months Homicidal  Ideation: No Does patient have any lifetime risk of violence toward others beyond the six months prior to admission? : No Thoughts of Harm to Others: No Current Homicidal Intent: No Current Homicidal Plan: No Access to Homicidal Means: No Identified Victim: No one History of harm to others?: No Assessment of Violence: None Noted Violent Behavior Description: No one Does patient have access to weapons?: No Criminal Charges Pending?: No Does patient have a court date: No Is patient on probation?: No  Psychosis Hallucinations: None noted Delusions: None noted  Mental Status Report Appearance/Hygiene: In hospital gown Eye Contact: Fair Motor Activity: Unsteady Speech:  Logical/coherent Level of Consciousness: Quiet/awake Mood: Depressed, Helpless Affect: Appropriate to circumstance Anxiety Level: Moderate Thought Processes: Coherent, Relevant Judgement: Unimpaired Orientation: Appropriate for developmental age Obsessive Compulsive Thoughts/Behaviors: None  Cognitive Functioning Concentration: Decreased Memory: Remote Intact, Recent Intact(Per wife his memory is good.) Is patient IDD: No Insight: Fair Impulse Control: Good Appetite: Good Have you had any weight changes? : No Change Sleep: No Change Total Hours of Sleep: 8 Vegetative Symptoms: None  ADLScreening [facility] Assessment Services) Patient's cognitive ability adequate to safely complete daily activities?: Yes Patient able to express need for assistance with ADLs?: Yes Independently performs ADLs?: No  Prior Inpatient Therapy Prior Inpatient Therapy: No  Prior Outpatient Therapy Prior Outpatient Therapy: No Does patient have an ACCT team?: No Does patient have Intensive In-House Services?  : No Does patient have [facility] services? : No Does patient have P4CC services?: No  ADL Screening (condition at time of admission) Patient's cognitive ability adequate to safely complete daily activities?: Yes Patient able to express need for assistance with ADLs?: Yes Independently performs ADLs?: No       Abuse/Neglect Assessment (Assessment to be complete while patient is alone) Abuse/Neglect Assessment Can Be Completed: Yes Physical Abuse: Denies Verbal Abuse: Denies Sexual Abuse: Denies Exploitation of patient/patient's resources: Denies     Merchant navy officerAdvance Directives (For Healthcare) Does Patient Have a Medical Advance Directive?: Yes Type of Advance Directive: Healthcare Power of Attorney, Living will Copy of Healthcare Power of Attorney in Chart?: No - copy requested Copy of Living Will in Chart?: No - copy requested          Disposition:  Disposition Initial Assessment  Completed for this Encounter: Yes Patient referred to: Other (Comment)(CSW dept assisting with services at home.)  This service was provided via telemedicine using a 2-way, interactive audio and video technology.  Names of all persons participating in this telemedicine service and their role in this encounter. Name: Kent Nolan Role: patient  Name: Kent Nolan Role: spouse  Name: Beatriz StallionMarcus Kateryna Grantham, M.S. LCAS QP Role: clinician  Name:  Role:     Kent Nolan, Kent Nolan 03/01/2019 12:00 AM

## 2019-02-28 NOTE — ED Notes (Signed)
TTS at bedside. 

## 2019-02-28 NOTE — Progress Notes (Signed)
CSW received a call from pt's EPD who discussed pt's recent ED visits (6/25, 6/19. 6/9) and how pt discussed with the EDP, how the pt, "didn't want to do this anymore", when talking about bouncing back and forth between the ED and home.  Per the notes pt presented to the ED on 6/9 after the pt, "tripped on dog this am and fell, landed on left side and now has left upper arm pain".  Per EDP on 6/25 this had resulted in a broken arm.  [facility]  [facility] to pt experiencing difficulty urinasting, with dark color and odor and also altered mental status.  Per notes from 6/19: Of note: Has pacemaker, bedsores, doesn't walk, and hard of hearing.  Per CSW's note: Patient is reportedly hard of hearing and has dead batteries in his hearing aides, CSW spoke with wife, [name] [phone]).  [name] and family prefer that the patient discharge home and are interested in home health services. Wife and patient decline SNF referrals and placement at this time. Wife shares that she would feel more comfortable with the patient being home and cites that there are family members that live nearby and are involved.  SUMMARY of ED Visit on 6/19: Wife given choice of pt remaining in ED for SNF placement with possibility pf paying privately if insurance (HTA) doesn't cover it OR [facility] to [facility] from [facility].  W[facility] to pursue [facility] placement  On 6/20: Per the notes, pt's wife took pt to outpatient provider,on 6/20 stated no HH was forthcoming and outpatient provide also recommended HH.  Per wife, the patient (STILL) does not have home health services (as of 6.20) at this time.  On 6/23, per notes, pt called PCP and stated HH is to come out and but has not yet, per notes.  On 6/24, per notes, "Angelique BlonderDenise called from [facility] stating she believe patient needs a urologist.   Angelique Blonderenise also stated the  daughter pulled denise aside to let her know she is not sure if the "mother" can help take care of patient anymore, so she is going to call a Child psychotherapistsocial worker.  Denise call back # [phone]".  On 6/25: Wife [name] calling as she is very flustered at what the next step is for the pt.  She states she feels like she needs some additional help for caring for the pt.  She states she is not very strong, and unable to care for him when he is hurting. She states she does not think she can get him into the bathroom to the commode, so she needs a bedside commode. Pt not sleeping in his own bed, because pt is unable to get into his bed.  Pt sleeping in the recliner.  [name] is talking like she feel the pt could be better cared for in a rehab facility. But her biggest fear is not being able to see him. She believes he needs more help than she can give. She just does not know what to do, and would like some guidance.  On 6/25:  Pt's [facility] and for ongoing [facility] management assistance and [facility] told by pt's wife, per the notes, "wife voiced understanding, states she is has a lot going on right, and is in agreement to follow up at a later time" and then proceeded  to tell the [facility] CSW that pt had been brought into the ED.  On 6/25: Per notes, pt was BIB EMS with Level 5 caveat secondary to altered mental status.  Per the EDP's notes from today (6/25), "Patient himself is rather vague and said he is done living.  He denies doing anything to hurt himself, think he is just sick of his physical decline".  CSW stated he will review case and updated the EDP.  CSW will continue to follow for D/C needs.  Alphonse Guild. Shaman Muscarella, LCSW, LCAS, CSI Transitions of Care Clinical Social Worker Care Coordination Department Ph: [phone]

## 2019-02-28 NOTE — Telephone Encounter (Signed)
Ok for referral?

## 2019-02-28 NOTE — Addendum Note (Signed)
Addended by: Biagio Borg on: 02/28/2019 12:26 PM   Modules accepted: Orders

## 2019-02-28 NOTE — Telephone Encounter (Incomplete)
Wife Kent Nolan calling as she is very flustered at what the next step is for the pt.  She states she feels like she needs some additional help for caring for the pt.  She states she is not very strong, and unable to care for him when he is hurting. She states she does not think she can get him into the bathroom to the commode, so she needs a bedside commode. Pt not sleeping in his own bed, because pt is unable to get into his bed.  Pt sleeping in the recliner.  Kent Nolan is talking like she feel the pt could be better cared for in a rehab facility. But her biggest fear is not being able to see him. She believes he needs more help than she can give. She just does not know what to do, and would like some guidance.

## 2019-02-28 NOTE — Patient Outreach (Signed)
[facility]) Care Nolan  02/28/2019  ANOOP HEMMER 25-Apr-1929 [id number]   Subjective: Case discussed with Kent Nolan at [place] [facility] via in basket message and with [name] at [facility] via secure email on 02/21/2019. Telephone call to patient's home number, male answering phone, states Kent Nolan, not available, and advised to speak with patient's wife.  Spoke with patient's wife (designated party release) Kent Nolan, she stated patient name, date of birth, and address.  Discussed Kent Nolan [employer] MD referral follow up, wife voiced understanding, states she is has a lot going on right, and is in agreement to follow up at a later time.   RNCM  advised [employer] has also partnered with [facility], patient may be receiving information regarding additional disease Nolan services that patient may be eligible for in the near future.   States patient was just taken to the hospital, has a lot of family around her right now,  is waiting to hear an update on his condition, and determine next steps.   RNCM advised will monitor patient's hospitalization status and call her back at a later time.    Objective: Per KPN (Knowledge Performance Now, point of care tool) and chart review, patient had ED visit on 02/22/2019 - 02/23/2019 for UTI and on 02/12/2019 for status post fall, left proximal humerus fracture, discharge to home with sling, advised by ED to follow up with outpatient orthopedic MD.  Patient also has a history of CAD, hypertension, chronic kidney disease, hyperlipidemia, Sick sinus syndrome, pacemaker, stroke, and PAF (paroxysmal atrial fibrillation).   Patient currently active with [facility] for physical therapy, occupational therapy, RN, Education officer, museum, and Aide.       Assessment: Received [employer] MD referral  follow up on 02/21/2019.  Referral source: Dr. Cathlean Cower.   Referral reason: Gait disorder, left arm fracture, status post CVA, with cognitive disorder.  Screening  follow up pending contact with patient's wife.     Plan: RNCM will call patient's wife for 2nd telephone outreach attempt within 4 business days, screening follow up, to verify hospitalization status, and will proceed with case closure within 10 business days if no return call.    Kent Nolan H. Annia Friendly, BSN, [place] Nolan [facility] Telephonic CM Phone: [phone] Fax: [phone]

## 2019-02-28 NOTE — Progress Notes (Addendum)
CSW consulted with CSW Leadership who stated that with a broken arm that has been present for two weeks it is likely that pt will not be authorized a SNF stay for the broken arm and that pt's declining ability to ambulate without an acute, rehab-able injury would be seen as custodial most likely and not requiring a SNF stay.  Per Leadership, pt's AMS would be seen as a difficulty in pt participating in PT/OT which a SNF would possibly see as a liability.    CSW Leadership stated Leadership would fully support pt's referral to a SNF in the ED if the pt's wife would be willing or able to private pay.  9:01 PM CSW called pt's wife who stated understanding of the above and continued with her previous conversation that she is having a hard time making a decision.  Pt's wife stated she may be able to afford a month or two of SNF rehab and no more, but can not afford long-term care in a SNF beyond that.    Pt's daughter interjected two times on the phone call stating obviously the pt cannot ambulate and what the CSW was saying about the possibility of pt not being given authorized SNF days did not make sense and was kindly re-directed by the CSW to what the CSW had counseled the pt's spouse on earlier and stated CSW was not making the decision but merely stating what all possibilities are so family can prepare for any eventuality.  CSW counseled family on pros and cons of placement from the ED into a SNF:  Pros: Pt goes directly to a SNF Cons: Due to time constraints in the ED pt would have to go to the first available SNF that accepts the pt if pt/pt's family';s preferred SNF options are not available.  If pt agrees to attempt placement from the ED they have to agree to private pay if insurance does not, per CSW Leadership.  Pt's spouse stated her preferences (SNF would need to be closy by her home in [place]) and CSW counseled this could not be guaranteed.  CSW asked pt's wife specifically whether she  will be willing to pay privately or not and pt's wife started she would prefer not to but cannot make a decision as she is overwhelmed emotionally.    CSW continued with pt's wife after providing active listening  And validation was also provided as to the stress involved in making these types of decisions.  Pt's wife stated she would like to verify what market-driven prices are for 24 hour a day care and stated she would call Corliss Blacker with [facility] at ph: [phone] and consult with him before making a decision as to whether request SNF placement but again stated she could not care for the pt in the home by herself.  9:42 PM CSW spoke to the EDP and updated him as to the suggestions of [place].    EDP stated TTS consult is in place due to pt's previous statements about how the pt stated something like the pt, ""didn't want to do this anymore".  2nd shift ED CSW will leave handoff for 1st shift ED CSW. . CSW will continue to follow for D/C needs.  Kent Nolan. Kent Grussing, LCSW, LCAS, CSI Transitions of Care Clinical Social Worker Care Coordination Department Ph: [phone]

## 2019-02-28 NOTE — Telephone Encounter (Signed)
Spoke to pt's wife, Sabino Snipes, pt has been sent to [facility] ED. Stated the pt did not want anything to eat, didn't want to see anybody an didn't want treatment from any physicians. He stated that he wasn't feeling well so pt's wife, Sabino Snipes and family decided to call EMS to have him looked at. They took the pt to the hospital for further evaluation and the are waiting for an update call due to no visitors allowed at the hospital.

## 2019-02-28 NOTE — Progress Notes (Addendum)
CSW received a call from Corliss Blacker with [facility] at ph: [phone] who stated he had consulted with the pt's spouse and counseled her on pricing but that before making a decision the pt's wife would have to consult with family as to whether they would request SNF placement, or request 24 hour care from [facility] or another care gving [facility].  Mr Koleen Nimrod with [facility] stated he would prepare his team to provide 12-hour a day care now so that if pt/pt's family decided to take the pt home [facility] can serve that purpose if the family decides to choose them for PCA care.  Pt's wife stated she will be awaiting a call from the hospital/TOC CM/CSW's on 6/26 with an update so a decision can be made.  CSW will continue to follow for D/C needs.  Alphonse Guild. Markesia Crilly, LCSW, LCAS, CSI Transitions of Care Clinical Social Worker Care Coordination Department Ph: [phone]

## 2019-02-28 NOTE — ED Triage Notes (Addendum)
Pt to ED via [facility] with c/o being more lethargic than normal.  Pt was recently seen for urinary retention and UTI and had indwelling cath. Placed.  Pt has no complaints at this time.  Neuro exam neg.  EMS gave pt 440ml NS PTA

## 2019-02-28 NOTE — ED Notes (Signed)
Per Mortimer Fries RN, pt did not come with any belongings.

## 2019-02-28 NOTE — Progress Notes (Signed)
CSW called pt's wife who stated she had difficulty making a decision regarding pt's disposition due to her not being able to see or care for her husband in a SNF and also due to her inability to care for her husband in the home in a fashion she sees as adequate due to his large size, and her small size (118 pounds).  Pt's wife also spoke of her fear of COVID in the SNF.  Pt's wife acknowledges she did not allow [facility] RN CM to connect with her as the pt had already returned to the hospital.  CSW will continue to follow for D/C needs.  Alphonse Guild. Ketan Renz, LCSW, LCAS, CSI Transitions of Care Clinical Social Worker Care Coordination Department Ph: [phone]

## 2019-02-28 NOTE — ED Provider Notes (Signed)
[facility]ENC[facility] Provider Note   CSN: [id number] Arrival date & time: 02/28/19  1506     History   Chief Complaint Chief Complaint  Patient presents with  . Hypotension    HPI Kent Nolan is a 85 y.o. male.  Level 5 caveat secondary to altered mental status.  Patient has been in and out of the hospital this month.  Earlier in the month he had a fall in which he broke his left humerus.  He was back in the emergency department a few days ago for lethargy and urinary retention.  He had a Foley placed.  There was a PT OT consult and he was recommended for rehab but it sounds like he chose to go home.  Family called EMS today after finding him more lethargic when he woke up.  It sounds like he has had some waxing and waning mental status.  Patient himself is rather vague and said he is "done living."  He denies doing anything to hurt himself, think he is just sick of his physical decline.     The history is provided by the patient and the EMS personnel.  Altered Mental Status Presenting symptoms: lethargy   Severity:  Unable to specify Most recent episode:  Today Timing:  Intermittent Progression:  Unchanged Chronicity:  New Context: recent illness   Associated symptoms: depression (?), rash (diaper), suicidal behavior (?) and weakness (general)   Associated symptoms: no abdominal pain, no difficulty breathing, no fever, no nausea and no vomiting     Past Medical History:  Diagnosis Date  . BRADYCARDIA 08/04/2010   s/p PPM since 2011 - generator change 12/03/13  . Chronic kidney disease    followed by nephrology  . Coronary artery disease    remote subendo MI in February 2005 with stents to RCA &LCX; s/p scute thrombosis of both stents in February 2010 with cardiogenic shock - s/p BMS to both lesions; follow up cath in September 2010 was satisfactory.  He is medically managed with aspirin/Plavix  . GERD (gastroesophageal reflux disease)   .  High risk medication use    on amiodarone for PAF/VT since February 2010  . HTN (hypertension)   . Hyperlipidemia   . Obesity   . Pacemaker   . PAF (paroxysmal atrial fibrillation) (HCC)    s/p cardioversion in August 2011  . Stroke (HCC)   . Type II or unspecified type diabetes mellitus without mention of complication, uncontrolled     Patient Active Problem List   Diagnosis Date Noted  . Bed sore on buttock 02/23/2019  . Left humeral fracture 02/21/2019  . Bruising 02/21/2019  . Peripheral edema 02/21/2019  . Fall 02/15/2019  . Allergic conjunctivitis 08/14/2018  . Gait disturbance, post-stroke   . Cognitive deficit, post-stroke   . Stroke (cerebrum) (HCC) 10/23/2017  . 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 Lower back pain 03/30/2014  . Encounter for well adult exam with abnormal findings 11/26/2013  . Diabetes (HCC) 11/26/2013  . Coronary artery disease   . PAF (paroxysmal atrial fibrillation) (HCC)   . GERD (gastroesophageal reflux disease)   . Hyperlipidemia   . Chronic kidney disease   . Pacemaker   . Essential hypertension, benign 08/04/2010  . Sick sinus syndrome (HCC) 08/04/2010    Past Surgical History:  Procedure Laterality Date  . EP IMPLANTABLE DEVICE N/A 02/25/2015   Procedure: Lead Revision/Repair;  Surgeon: Hillis RangeJames Allred, MD;  Location: [facility] INVASIVE CV LAB;  Service: Cardiovascular;  Laterality: N/A;  . IR CT HEAD LTD  10/23/2017  . IR PERCUTANEOUS ART THROMBECTOMY/INFUSION INTRACRANIAL INC DIAG ANGIO  10/23/2017  . PACEMAKER GENERATOR CHANGE  12/03/13   MDT Adapta L generator change for premature ERI by [name]  . PACEMAKER INSERTION  May 2011  . PERMANENT PACEMAKER GENERATOR CHANGE N/A 12/03/2013   Procedure: PERMANENT PACEMAKER GENERATOR CHANGE;  Surgeon: Gardiner RhymeJames D Allred, MD;  Location: [facility] CATH LAB;  Service: Cardiovascular;  Laterality: N/A;  . RADIOLOGY WITH ANESTHESIA N/A 10/23/2017   Procedure:  RADIOLOGY WITH ANESTHESIA;  Surgeon: Julieanne Cottoneveshwar, Sanjeev, MD;  Location: [facility] OR;  Service: Radiology;  Laterality: N/A;        Home Medications    Prior to Admission medications   Medication Sig Start Date End Date Taking? Authorizing Provider  acetaminophen (TYLENOL) 500 MG tablet Take 500 mg by mouth every 6 (six) hours as needed for moderate pain.    [provider]  amiodarone (PACERONE) 200 MG tablet TAKE 1/2 TABLET BY MOUTH EVERY DAY Patient taking differently: Take 100 mg by mouth daily. TAKE 1/2 TABLET BY MOUTH EVERY DAY 12/17/18   Rosalio MacadamiaGerhardt, Lori C, NP  aspirin EC 81 MG tablet Take 81 mg by mouth daily.    [provider]  azelastine (OPTIVAR) 0.05 % ophthalmic solution Place 1 drop into both eyes 2 (two) times daily. Patient taking differently: Place 1 drop into both eyes 2 (two) times daily as needed (dry eyes).  08/14/18   Corwin LevinsJohn, James W, MD  Blood Glucose Monitoring Suppl (FREESTYLE LITE) DEVI Use as directed once daily E11.9 02/15/19   Corwin LevinsJohn, James W, MD  Cholecalciferol (VITAMIN D-3 PO) Take 1 tablet by mouth daily.    [provider]  ELIQUIS 2.5 MG TABS tablet TAKE 1 TABLET BY MOUTH TWICE A DAY Patient taking differently: Take 2.5 mg by mouth 2 (two) times daily.  06/19/18   Allred, Fayrene FearingJames, MD  famotidine (PEPCID) 40 MG tablet TAKE 1 TABLET BY MOUTH EVERY DAY Patient taking differently: Take 40 mg by mouth daily.  06/28/18   Allred, Fayrene FearingJames, MD  fenofibrate (TRICOR) 145 MG tablet TAKE 1 TABLET BY MOUTH EVERY DAY Patient taking differently: Take 145 mg by mouth daily.  05/11/18   Allred, Fayrene FearingJames, MD  glipiZIDE (GLUCOTROL XL) 2.5 MG 24 hr tablet Take 1 tablet (2.5 mg total) by mouth daily with breakfast. 08/14/18   Corwin LevinsJohn, James W, MD  glucose blood (FREESTYLE LITE) test strip Use as instructed once daily E11.9 02/15/19   Corwin LevinsJohn, James W, MD  HYDROcodone-acetaminophen (NORCO/VICODIN) 5-325 MG tablet Take 1 tablet by mouth every 6 (six) hours as needed for moderate  pain. 02/21/19   Corwin LevinsJohn, James W, MD  iron polysaccharides (NU-IRON) 150 MG capsule Take 1 capsule (150 mg total) by mouth daily. Patient not taking: Reported on 02/22/2019 02/21/19   Corwin LevinsJohn, James W, MD  Lancets MISC Use as directed once daily E11.9 02/15/19   Corwin LevinsJohn, James W, MD  lidocaine (LIDODERM) 5 % Place 1 patch onto the skin daily. Remove & Discard patch within 12 hours or as directed by MD Patient not taking: Reported on 02/22/2019 04/11/18   Corwin LevinsJohn, James W, MD  Multiple Vitamin (MULTIVITAMIN) tablet Take 1 tablet by mouth daily.      [provider]  nitroGLYCERIN (NITROSTAT) 0.4 MG SL tablet Place 1 tablet (0.4 mg total) under the tongue every 5 (five) minutes as needed. For chest pain 02/27/15  Thompson Grayer, MD  rosuvastatin (CRESTOR) 20 MG tablet Take 1 tablet (20 mg total) by mouth daily. 07/18/18   Thompson Grayer, MD    Family History Family History  Problem Relation Age of Onset  . Heart disease Mother   . Stomach cancer Father   . Diabetes Father     Social History Social History   Tobacco Use  . Smoking status: Former Smoker    Packs/day: 3.00    Types: Cigarettes  . Smokeless tobacco: Never Used  . Tobacco comment: back in the [place]  Substance Use Topics  . Alcohol use: No  . Drug use: No     Allergies   Penicillins, Sulfonamide derivatives, and Tramadol   Review of Systems Review of Systems  Unable to perform ROS: Mental status change  Constitutional: Negative for fever.  HENT: Negative for sore throat.   Eyes: Negative for visual disturbance.  Respiratory: Negative for shortness of breath.   Cardiovascular: Negative for chest pain.  Gastrointestinal: Negative for abdominal pain, nausea and vomiting.  Genitourinary: Positive for penile pain (catheter).  Musculoskeletal: Positive for neck stiffness.  Skin: Positive for rash (diaper).  Neurological: Positive for weakness (general).     Physical Exam Updated Vital Signs BP (!) 153/95 (BP  Location: Right Arm)   Pulse 74   Temp 97.8 F (36.6 C) (Oral)   Resp 19   SpO2 98%   Physical Exam Vitals signs and nursing note reviewed.  Constitutional:      Appearance: He is well-developed.  HENT:     Head: Normocephalic and atraumatic.  Eyes:     Conjunctiva/sclera: Conjunctivae normal.  Neck:     Musculoskeletal: Neck supple.  Cardiovascular:     Rate and Rhythm: Normal rate and regular rhythm.     Heart sounds: No murmur.  Pulmonary:     Effort: Pulmonary effort is normal. No respiratory distress.     Breath sounds: Normal breath sounds.  Abdominal:     Palpations: Abdomen is soft.     Tenderness: There is no abdominal tenderness.  Musculoskeletal: Normal range of motion.        General: Tenderness present.     Right lower leg: No edema.     Left lower leg: No edema.  Skin:    General: Skin is warm and dry.     Capillary Refill: Capillary refill takes less than 2 seconds.  Neurological:     Mental Status: He is alert. Mental status is at baseline.     GCS: GCS eye subscore is 4. GCS verbal subscore is 5. GCS motor subscore is 6.      ED Treatments / Results  Labs (all labs ordered are listed, but only abnormal results are displayed) Labs Reviewed  COMPREHENSIVE METABOLIC PANEL - Abnormal; Notable for the following components:      Result Value   CO2 20 (*)    BUN 28 (*)    Creatinine, Ser 1.67 (*)    Calcium 8.4 (*)    Total Protein 5.6 (*)    Albumin 2.3 (*)    GFR calc non Af Amer 36 (*)    GFR calc Af Amer 42 (*)    All other components within normal limits  CBC WITH DIFFERENTIAL/PLATELET - Abnormal; Notable for the following components:   RBC 3.58 (*)    Hemoglobin 11.1 (*)    HCT 37.4 (*)    MCV 104.5 (*)    MCHC 29.7 (*)    All other  components within normal limits  URINALYSIS, ROUTINE W REFLEX MICROSCOPIC - Abnormal; Notable for the following components:   Color, Urine AMBER (*)    APPearance CLOUDY (*)    Hgb urine dipstick MODERATE  (*)    Protein, ur 30 (*)    Leukocytes,Ua LARGE (*)    WBC, UA >50 (*)    Bacteria, UA FEW (*)    Non Squamous Epithelial 0-5 (*)    All other components within normal limits  PROTIME-INR - Abnormal; Notable for the following components:   Prothrombin Time 17.7 (*)    INR 1.5 (*)    All other components within normal limits  SARS CORONAVIRUS 2 (HOSPITAL ORDER, PERFORMED IN [facility])  CULTURE, BLOOD (ROUTINE X 2)  CULTURE, BLOOD (ROUTINE X 2)  URINE CULTURE  LACTIC ACID, PLASMA  LIPASE, BLOOD  LACTIC ACID, PLASMA    EKG EKG Interpretation  Date/Time:  Thursday February 28 2019 15:29:30 EDT Ventricular Rate:  74 PR Interval:    QRS Duration: 214 QT Interval:  498 QTC Calculation: 553 R Axis:   -40 Text Interpretation:  AV paced Left atrial enlargement Left bundle branch block Artifact in lead(s) V1 V2 similar to prior 6/20 Confirmed by Meridee Score [phone]) on 02/28/2019 4:12:58 PM   Radiology Dg Chest Port 1 View  Result Date: 02/28/2019 CLINICAL DATA:  Weakness EXAM: PORTABLE CHEST 1 VIEW COMPARISON:  02/23/2019, 10/23/2017, 02/12/2019 FINDINGS: Right-sided multi lead pacing device as before. Cardiomegaly with aortic atherosclerosis. Linear atelectasis at the left base. No pleural effusion. No pneumothorax. Subacute fracture deformity at the proximal left humerus. IMPRESSION: No active disease. Mild cardiomegaly with subsegmental atelectasis at the left base. Subacute left humeral neck fracture with displacement and angulation. Electronically Signed   By: Jasmine Pang M.D.   On: 02/28/2019 16:25    Procedures Procedures (including critical care time)  Medications Ordered in ED Medications  lidocaine (LIDODERM) 5 % 1 patch (1 patch Transdermal Patch Applied 02/28/19 2155)  acetaminophen (TYLENOL) tablet 500 mg (has no administration in time range)  aspirin EC tablet 81 mg (81 mg Oral Not Given 02/28/19 2206)  ketotifen (ZADITOR) 0.025 % ophthalmic solution 1  drop (has no administration in time range)  apixaban (ELIQUIS) tablet 2.5 mg (has no administration in time range)  famotidine (PEPCID) tablet 40 mg (40 mg Oral Given 02/28/19 2206)  glipiZIDE (GLUCOTROL XL) 24 hr tablet 2.5 mg (2.5 mg Oral Given 03/01/19 0910)  HYDROcodone-acetaminophen (NORCO/VICODIN) 5-325 MG per tablet 1 tablet (1 tablet Oral Given 02/28/19 2156)  rosuvastatin (CRESTOR) tablet 20 mg (20 mg Oral Not Given 02/28/19 2207)  sodium chloride 0.9 % bolus 500 mL (0 mLs Intravenous Stopped 02/28/19 1909)  cefTRIAXone (ROCEPHIN) 1 g in sodium chloride 0.9 % 100 mL IVPB (0 g Intravenous Stopped 02/28/19 1909)  apixaban (ELIQUIS) tablet 2.5 mg (2.5 mg Oral Given 02/28/19 2218)     Initial Impression / Assessment and Plan / ED Course  I have reviewed the triage vital signs and the nursing notes.  Pertinent labs & imaging results that were available during my care of the patient were reviewed by me and considered in my medical decision making (see chart for details).  Clinical Course as of Feb 28 933  Thu Feb 28, 2019  1540 His ECG is paced rate of 74 nonspecific ST-T is similar to prior EKG 02/22/2019.   [MB]  1651 Differential diagnosis includes sepsis, dehydration, anemia, arrhythmia.  Patient's blood pressure is been up since he  got here after receiving a little bit of fluids by EMS.  He denies any real complaints but he seems very depressed.  I reached out to his wife and she is noticed that his mood is been down with these hospitalizations and she is wondering if having the catheter in is making her feel also very invalid.  They were going to be getting physical therapy at home but it does not sound like it started yet.   [MB]  2048 Social work has been reaching out to the patient's wife and they have been discussing options regarding SNF versus returning home with services.   [MB]  2145 Social work has been in touch with the patient's wife and she still cannot make a decision whether  he should go to rehab or going home.  Ultimately the recommendation from their leadership is that we should board him overnight and get a TTS consult to evaluate his possible suicidal thoughts and then in the morning social work and reconnect and figure out if he is going home or be getting placed.   [MB]    Clinical Course User Index [MB] Terrilee FilesButler,  C, MD        Final Clinical Impressions(s) / ED Diagnoses   Final diagnoses:  Weakness  Lethargy  Lower urinary tract infectious disease    ED Discharge Orders    None       Terrilee FilesButler,  C, MD 03/01/19 [phone]

## 2019-03-01 ENCOUNTER — Other Ambulatory Visit: Payer: Self-pay | Admitting: *Deleted

## 2019-03-01 NOTE — Progress Notes (Addendum)
CSW received a call from [place] at ph: [phone] from [facility] saying that they can accept pt tomorrow (6/27).  CSW called pt's daughter/family decision-maker daughter Neoma Laming who stated that the family is in agreement with the pt going to [facility] on 6/27 and that they are requesting updates prior to the pt D/C'ing on 6/27 to [facility] from the RN/CSW's.  CSW called Crystal the on-call [facility] CSW at ph: [phone] and left her a VM on her secure line to inform her pt's family chose [facility] and that [facility] has the HTA auth # [id number].  CSW: Please call Crystal again tomorrow to ensure [facility] is aware that pt is going to [facility].  2nd shift ED CSW will leave handoff for 1st shift ED CSW.  CSW will continue to follow for D/C needs.  Alphonse Guild. Kla Bily, LCSW, LCAS, CSI Transitions of Care Clinical Social Worker Care Coordination Department Ph: [phone]

## 2019-03-01 NOTE — ED Notes (Signed)
Diet was ordered for Breakfast. 

## 2019-03-01 NOTE — Progress Notes (Signed)
CSW received a call from pt's daughter Neoma Laming at ph: [phone] who is asking that she be given a call by the RN CM to educate her on getting a hospital bed and bedside commode for her father.  CSW called the [facility] RN CM who stated that [facility] ED TOC RN CM can't order if for the pt unless the patient was going home from the [facility] ED and that the normal SOP is for the SNF to order needed DME at D/C from the SNF.  CSW updated family.    CSW will continue to follow for D/C needs.  Alphonse Guild. Jannessa Ogden, LCSW, LCAS, CSI Transitions of Care Clinical Social Worker Care Coordination Department Ph: [phone]

## 2019-03-01 NOTE — Progress Notes (Addendum)
CSW received notification from a coworker who received a text requesting CSW to call Dr. Amalia Hailey at [phone]. CSW spoke with Dr. Amalia Hailey who stated that HTA would be able to support a 7-14 day stay at [facility] for stabilization. Dr. Amalia Hailey stated a prior authorization would need to be requested for this patient. Dr. Amalia Hailey requested that CSW reach out to patient's wife to discuss this matter.  CSW attempted to reach patient's wife Hoyle Sauer at [phone] without success, call continued to ring and ring with no answer.  Madilyn Fireman, MSW, LCSW-A Clinical Social Worker [facility] [phone]

## 2019-03-01 NOTE — Progress Notes (Signed)
CSW please call pt's daughter Neoma Laming at ph: [phone] for updates and for decision-making, permission was provided verbally by pt's spouse to the CSW (this Probation officer).  CSW will continue to follow for D/C needs.  Alphonse Guild. Angelene Rome, LCSW, LCAS, CSI Transitions of Care Clinical Social Worker Care Coordination Department Ph: [phone]

## 2019-03-01 NOTE — NC FL2 (Signed)
[place] MEDICAID FL2 LEVEL OF CARE SCREENING TOOL     IDENTIFICATION  Patient Name: Kent Nolan Birthdate: 02/09/1929 Sex: male Admission Date (Current Location): 02/28/2019  [facility] [place]   Facility and Address:  [facility],  [address], [place] [id number]      Provider Number: [id number]  Attending Physician Name and Address:  Default, Provider, MD  Relative Name and Phone Number:       Current Level of Care: Hospital Recommended Level of Care: Skilled Nursing Facility Prior Approval Number:    Date Approved/Denied:   PASRR Number: [id number]  Discharge Plan: SNF    Current Diagnoses: Patient Active Problem List   Diagnosis Date Noted  . Bed sore on buttock 02/23/2019  . Left humeral fracture 02/21/2019  . Bruising 02/21/2019  . Peripheral edema 02/21/2019  . Fall 02/15/2019  . Allergic conjunctivitis 08/14/2018  . Gait disturbance, post-stroke   . Cognitive deficit, post-stroke   . Stroke (cerebrum) (HCC) 10/23/2017  . 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 Lower back pain 03/30/2014  . Encounter for well adult exam with abnormal findings 11/26/2013  . Diabetes (HCC) 11/26/2013  . Coronary artery disease   . PAF (paroxysmal atrial fibrillation) (HCC)   . GERD (gastroesophageal reflux disease)   . Hyperlipidemia   . Chronic kidney disease   . Pacemaker   . Essential hypertension, benign 08/04/2010  . Sick sinus syndrome (HCC) 08/04/2010    Orientation RESPIRATION BLADDER Height & Weight     Self, Time, Situation, Place  Normal Continent Weight:   Height:     BEHAVIORAL SYMPTOMS/MOOD NEUROLOGICAL BOWEL NUTRITION STATUS      Continent Diet(regular)  AMBULATORY STATUS COMMUNICATION OF NEEDS Skin   Extensive Assist Verbally Normal                       Personal Care Assistance Level of Assistance  Bathing, Feeding, Dressing Bathing Assistance:  Maximum assistance Feeding assistance: Limited assistance Dressing Assistance: Maximum assistance     Functional Limitations Info  Sight, Hearing, Speech Sight Info: Adequate Hearing Info: Impaired(has hearing aids) Speech Info: Adequate    SPECIAL CARE FACTORS FREQUENCY  PT (By licensed PT), OT (By licensed OT)     PT Frequency: 5x weekly OT Frequency: 5x weekly            Contractures Contractures Info: Not present    Additional Factors Info  Code Status, Allergies Code Status Info: Full Allergies Info: Penicillins, Sulfonamide Derivatives, Tramadol           Current Medications (03/01/2019):  This is the current hospital active medication list Current Facility-Administered Medications  Medication Dose Route Frequency Provider Last Rate Last Dose  . acetaminophen (TYLENOL) tablet 500 mg  500 mg Oral Q6H PRN Terrilee FilesButler, Michael C, MD      . apixaban Everlene Balls(ELIQUIS) tablet 2.5 mg  2.5 mg Oral BID Terrilee FilesButler, Michael C, MD   2.5 mg at 03/01/19 0949  . aspirin EC tablet 81 mg  81 mg Oral Daily Terrilee FilesButler, Michael C, MD   81 mg at 03/01/19 [id number]  . famotidine (PEPCID) tablet 40 mg  40 mg Oral QPM Terrilee FilesButler, Michael C, MD   40 mg at 02/28/19 2206  . glipiZIDE (GLUCOTROL XL) 24 hr tablet 2.5 mg  2.5 mg Oral Q breakfast Terrilee FilesButler, Michael C, MD   2.5 mg at 03/01/19 0910  . HYDROcodone-acetaminophen (NORCO/VICODIN) 5-325  MG per tablet 1 tablet  1 tablet Oral Q6H PRN Hayden Rasmussen, MD   1 tablet at 02/28/19 2156  . ketotifen (ZADITOR) 0.025 % ophthalmic solution 1 drop  1 drop Both Eyes BID PRN Hayden Rasmussen, MD      . lidocaine (LIDODERM) 5 % 1 patch  1 patch Transdermal Q24H Hayden Rasmussen, MD   1 patch at 02/28/19 2155  . rosuvastatin (CRESTOR) tablet 20 mg  20 mg Oral Daily Hayden Rasmussen, MD   20 mg at 03/01/19 4128   Current Outpatient Medications  Medication Sig Dispense Refill  . acetaminophen (TYLENOL) 500 MG tablet Take 500 mg by mouth every 6 (six) hours as needed for moderate  pain.    Marland Kitchen aspirin EC 81 MG tablet Take 81 mg by mouth daily.    Marland Kitchen azelastine (OPTIVAR) 0.05 % ophthalmic solution Place 1 drop into both eyes 2 (two) times daily. (Patient taking differently: Place 1 drop into both eyes 2 (two) times daily as needed (dry eyes). ) 6 mL 12  . ELIQUIS 2.5 MG TABS tablet TAKE 1 TABLET BY MOUTH TWICE A DAY (Patient taking differently: Take 2.5 mg by mouth 2 (two) times daily. ) 180 tablet 3  . famotidine (PEPCID) 40 MG tablet TAKE 1 TABLET BY MOUTH EVERY DAY (Patient taking differently: Take 40 mg by mouth every evening. ) 90 tablet 3  . fenofibrate (TRICOR) 145 MG tablet TAKE 1 TABLET BY MOUTH EVERY DAY (Patient taking differently: Take 145 mg by mouth daily. ) 90 tablet 3  . glipiZIDE (GLUCOTROL XL) 2.5 MG 24 hr tablet Take 1 tablet (2.5 mg total) by mouth daily with breakfast. 90 tablet 3  . HYDROcodone-acetaminophen (NORCO/VICODIN) 5-325 MG tablet Take 1 tablet by mouth every 6 (six) hours as needed for moderate pain. 30 tablet 0  . Multiple Vitamin (MULTIVITAMIN) tablet Take 1 tablet by mouth daily.      . nitroGLYCERIN (NITROSTAT) 0.4 MG SL tablet Place 1 tablet (0.4 mg total) under the tongue every 5 (five) minutes as needed. For chest pain 25 tablet 5  . rosuvastatin (CRESTOR) 20 MG tablet Take 1 tablet (20 mg total) by mouth daily. 90 tablet 3  . amiodarone (PACERONE) 200 MG tablet TAKE 1/2 TABLET BY MOUTH EVERY DAY (Patient not taking: Reported on 02/28/2019) 45 tablet 2  . Blood Glucose Monitoring Suppl (FREESTYLE LITE) DEVI Use as directed once daily E11.9 1 each 0  . glucose blood (FREESTYLE LITE) test strip Use as instructed once daily E11.9 100 each 12  . iron polysaccharides (NU-IRON) 150 MG capsule Take 1 capsule (150 mg total) by mouth daily. (Patient not taking: Reported on 02/22/2019) 90 capsule 0  . Lancets MISC Use as directed once daily E11.9 100 each 12  . lidocaine (LIDODERM) 5 % Place 1 patch onto the skin daily. Remove & Discard patch within 12  hours or as directed by MD (Patient not taking: Reported on 02/22/2019) 60 patch 1     Discharge Medications: Please see discharge summary for a list of discharge medications.  Relevant Imaging Results:  Relevant Lab Results:   Additional Information #SS Redbird Smith Destin Vins[place], [place]

## 2019-03-01 NOTE — Patient Outreach (Addendum)
[facility]) Care Management  03/01/2019  Kent Nolan [birth year] [id number]   Subjective: Telephone call to patient's home number, spoke with patient's wife Kent Nolan  (patiet's designated party release), stated patient's name, date of birth, and address.  [name] and daughter Kent Nolan) on the phone with this RNCM, wife states she is very overwhelmed,trying to process everything, and understand what is going on.    Wife remembers speaking with this RNCM in the past.  Discussed [facility] Care Management [employer] MD referral follow up, wife, and daughter voiced understanding, is in agreement to follow up.   Daughter states patient remain in the hospital and is going to be placed in a skilled nursing facility and they have been in contact with multiple people that the hospital receiving updates.  States they have most recently spoken with Kent Nolan) at the hospital that is assisting them with finding placement.  RNCM advised has referred patient to [facility] Liaison to follow up on discharge disposition / discharge planning and MD referral follow up while patient is inpatient via telephone.  Advised if patient discharge to home prior to skilled nursing facility placement then [employer] partnering disease management company ( [facility] or [facility]) will assist family with placement or follow up for care coordination.  Wife and daughter aware that hospital MD will determine when patient is ready for discharge.  [name] and daughter voices understanding of the above, states they are very appreciative of the updates, have no additional questions at this time, will follow up with hospital staff as needed, and have to go because they are receiving a call on the other line.  Case update sent via internal email to [place] Director at [facility].     Objective: Per KPN (Knowledge  Performance Now, point of care tool) and chart review, patient in ED, since 02/28/2019 for  Hypotension and skilled nursing facility placement.  Patient had ED visit on 02/22/2019 - 02/23/2019 for UTI and on 02/12/2019 for status post fall, left proximal humerus fracture, discharge to home with sling, advised by ED to follow up with outpatient orthopedic MD.  Patient also has a history of CAD, hypertension, chronic kidney disease, hyperlipidemia, Sick sinus syndrome, pacemaker, stroke, and PAF (paroxysmal atrial fibrillation).   Patient currently active with [facility] for physical therapy, occupational therapy, RN, Education officer, museum, and Aide.       Assessment: Received [employer] MD referral follow up on 02/21/2019.  Referral source: Dr. Cathlean Cower.   Referral reason: Gait disorder, left arm fracture, status post CVA, with cognitive disorder. Screening  follow up not completed due to patient being admitted to the hospital on 02/28/2019 and patient referred to [facility] Liaison for discharge disposition / discharge planning, referral follow up.      Plan: RNCM referred patient to [place] [facility] [place] Management for discharge disposition / discharge planning, referral follow up.       Kent Nolan H. Annia Friendly, BSN, [place] Management [facility] Telephonic CM Phone: [phone] Fax: [phone]

## 2019-03-01 NOTE — Progress Notes (Addendum)
CSW received a call from [place] who provided pt's auth # for a 7-day SNF stay [phone]) but need a facility name.  [facility] called the CSW back and stated that as of today [facility] has no available beds for a male.  [facility] has no bed until Monday.  CSW received a call from [place] at [facility] who stated they had an open bed and may be able to make an offer after reviewing the chart further and will call the 1st shift ED CSW back on 6/27 to update.  CSW spoke to [place] with [facility] (covering for [place] with [facility] and updated her that the only two choices are left are (if [facility] SNF declines again on 6/27):  1. [facility] and  2.  [facility] will let the1st shift ED CSW know on 6/26 and CSW can call Daleen Snook in admissions at [phone].  CSW called pt's daughter and updated her.  Pt's daughter then offered additional information regarding the pt's spouse who, per pt's daughter, who seems to also be presenting in the last couple of days with symptoms consistent with middle-stage dementia and asked if this would have any bearing on how long the facility kpt the pt after the insurance auth (7 days) ran out.  CSW stated this would have no bearing on the outcome of the facility's decision to keep the pt longer and the pt's daughter voiced understanding and CSW reiterated that the family coukld always private pay.  Of note:  CSW spoke to pt's wife on 6/25 and pt's wife presented and A&OX4 via phone, but did present as overwhelmed AEB the pt;s wife stating, "I'm just overwhelmed".                                                2nd shift ED CSW will leave handoff for 1st shift ED CSW.  CSW will continue to follow for D/C needs.  Alphonse Guild. Tyan Lasure, LCSW, LCAS, CSI Transitions of Care Clinical Social Worker Care Coordination Department Ph: [phone]

## 2019-03-01 NOTE — Progress Notes (Addendum)
2:00p - CSW received called from [place] with [facility] stating patient has been authorized for SNF; however, [facility] has no bed availability. CSW continuing to get other bed offers. CSW to provide family with accepted bed offers as they come so they can make a decision.   CSW currently working on getting placement to SNF. CSW spoke with Dr. Amalia Hailey with [facility] who made the recommendation for patient to go to a 7-14 days SNF placement for stabilization. CSW has been in contact with patient's daughter, Jackelyn Poling [phone]), throughout this process. Debbie reports patient's wife is right beside her as CSW assists with disposition plans. Debbie reports the family is agreeable to patient going for the SNF days as this will give them some time to set up the home to better care for patient. CSW went over the Medicare.gov list with Jackelyn Poling over the phone. Debbie chose [facility] for SNF from rating and that is is close to their home. CSW contacted [employer] [phone]) so assist with prior authorization for the SNF stay.  CSW received call from Corliss Blacker with [facility] asking for an update on the family's decision. CSW will call Tim back about this.   CSW waiting pre authorization decision from [place], LCSW Transitions of [place] ED [phone]

## 2019-03-01 NOTE — ED Notes (Addendum)
Pt placed on hospital bed, placed in clean brief and mepilex applied to buttock wound by Benay Pillow.

## 2019-03-01 NOTE — ED Notes (Signed)
MD [place] aware pt does not meet criteria for inpatient [place], Social work to see pt in am. Pt updated about this plan and agreeable.

## 2019-03-01 NOTE — ED Notes (Signed)
Pt. Was given coffee. A Diet was ordered for Lunch.

## 2019-03-01 NOTE — ED Provider Notes (Signed)
Patient evaluated on daily rounds.  He is sitting comfortably in the bed.  He is complaining of knee pain, which is chronic for several years.  He has PRN Tylenol ordered, which I advised the nurse that she can give.  Patient states they have x-rayed the knee before at his PCP and he denies any new injuries.  Patient is awaiting placement to SNF.     Frederica Kuster, PA-C 03/01/19 1344    Milton Ferguson, MD 03/05/19 1251

## 2019-03-02 ENCOUNTER — Other Ambulatory Visit: Payer: Self-pay

## 2019-03-02 DIAGNOSIS — R52 Pain, unspecified: Secondary | ICD-10-CM | POA: Diagnosis not present

## 2019-03-02 DIAGNOSIS — W19XXXA Unspecified fall, initial encounter: Secondary | ICD-10-CM | POA: Diagnosis not present

## 2019-03-02 DIAGNOSIS — R4182 Altered mental status, unspecified: Secondary | ICD-10-CM | POA: Diagnosis not present

## 2019-03-02 DIAGNOSIS — E119 Type 2 diabetes mellitus without complications: Secondary | ICD-10-CM | POA: Diagnosis not present

## 2019-03-02 DIAGNOSIS — R1312 Dysphagia, oropharyngeal phase: Secondary | ICD-10-CM | POA: Diagnosis not present

## 2019-03-02 DIAGNOSIS — E1122 Type 2 diabetes mellitus with diabetic chronic kidney disease: Secondary | ICD-10-CM | POA: Diagnosis not present

## 2019-03-02 DIAGNOSIS — N390 Urinary tract infection, site not specified: Secondary | ICD-10-CM | POA: Diagnosis not present

## 2019-03-02 DIAGNOSIS — Z950 Presence of cardiac pacemaker: Secondary | ICD-10-CM | POA: Diagnosis not present

## 2019-03-02 DIAGNOSIS — S42202D Unspecified fracture of upper end of left humerus, subsequent encounter for fracture with routine healing: Secondary | ICD-10-CM | POA: Diagnosis not present

## 2019-03-02 DIAGNOSIS — R2689 Other abnormalities of gait and mobility: Secondary | ICD-10-CM | POA: Diagnosis not present

## 2019-03-02 DIAGNOSIS — I469 Cardiac arrest, cause unspecified: Secondary | ICD-10-CM | POA: Diagnosis not present

## 2019-03-02 DIAGNOSIS — I129 Hypertensive chronic kidney disease with stage 1 through stage 4 chronic kidney disease, or unspecified chronic kidney disease: Secondary | ICD-10-CM | POA: Diagnosis not present

## 2019-03-02 DIAGNOSIS — N189 Chronic kidney disease, unspecified: Secondary | ICD-10-CM | POA: Diagnosis not present

## 2019-03-02 DIAGNOSIS — I959 Hypotension, unspecified: Secondary | ICD-10-CM | POA: Diagnosis not present

## 2019-03-02 DIAGNOSIS — M2550 Pain in unspecified joint: Secondary | ICD-10-CM | POA: Diagnosis not present

## 2019-03-02 DIAGNOSIS — I2510 Atherosclerotic heart disease of native coronary artery without angina pectoris: Secondary | ICD-10-CM | POA: Diagnosis not present

## 2019-03-02 DIAGNOSIS — I480 Paroxysmal atrial fibrillation: Secondary | ICD-10-CM | POA: Diagnosis not present

## 2019-03-02 DIAGNOSIS — G7089 Other specified myoneural disorders: Secondary | ICD-10-CM | POA: Diagnosis not present

## 2019-03-02 DIAGNOSIS — Z7401 Bed confinement status: Secondary | ICD-10-CM | POA: Diagnosis not present

## 2019-03-02 DIAGNOSIS — F322 Major depressive disorder, single episode, severe without psychotic features: Secondary | ICD-10-CM | POA: Diagnosis not present

## 2019-03-02 DIAGNOSIS — L89312 Pressure ulcer of right buttock, stage 2: Secondary | ICD-10-CM | POA: Diagnosis not present

## 2019-03-02 DIAGNOSIS — Z466 Encounter for fitting and adjustment of urinary device: Secondary | ICD-10-CM | POA: Diagnosis not present

## 2019-03-02 DIAGNOSIS — R8271 Bacteriuria: Secondary | ICD-10-CM | POA: Diagnosis not present

## 2019-03-02 DIAGNOSIS — S42302A Unspecified fracture of shaft of humerus, left arm, initial encounter for closed fracture: Secondary | ICD-10-CM | POA: Diagnosis not present

## 2019-03-02 DIAGNOSIS — K5901 Slow transit constipation: Secondary | ICD-10-CM | POA: Diagnosis not present

## 2019-03-02 DIAGNOSIS — R45851 Suicidal ideations: Secondary | ICD-10-CM | POA: Diagnosis not present

## 2019-03-02 DIAGNOSIS — R41841 Cognitive communication deficit: Secondary | ICD-10-CM | POA: Diagnosis not present

## 2019-03-02 DIAGNOSIS — I252 Old myocardial infarction: Secondary | ICD-10-CM | POA: Diagnosis not present

## 2019-03-02 DIAGNOSIS — T839XXA Unspecified complication of genitourinary prosthetic device, implant and graft, initial encounter: Secondary | ICD-10-CM | POA: Diagnosis not present

## 2019-03-02 DIAGNOSIS — R339 Retention of urine, unspecified: Secondary | ICD-10-CM | POA: Diagnosis not present

## 2019-03-02 DIAGNOSIS — G709 Myoneural disorder, unspecified: Secondary | ICD-10-CM | POA: Diagnosis not present

## 2019-03-02 DIAGNOSIS — R29898 Other symptoms and signs involving the musculoskeletal system: Secondary | ICD-10-CM | POA: Diagnosis not present

## 2019-03-02 DIAGNOSIS — Z7982 Long term (current) use of aspirin: Secondary | ICD-10-CM | POA: Diagnosis not present

## 2019-03-02 DIAGNOSIS — F320 Major depressive disorder, single episode, mild: Secondary | ICD-10-CM | POA: Diagnosis not present

## 2019-03-02 DIAGNOSIS — R402411 Glasgow coma scale score 13-15, in the field [EMT or ambulance]: Secondary | ICD-10-CM | POA: Diagnosis not present

## 2019-03-02 DIAGNOSIS — I6389 Other cerebral infarction: Secondary | ICD-10-CM | POA: Diagnosis not present

## 2019-03-02 DIAGNOSIS — Z79899 Other long term (current) drug therapy: Secondary | ICD-10-CM | POA: Diagnosis not present

## 2019-03-02 DIAGNOSIS — I495 Sick sinus syndrome: Secondary | ICD-10-CM | POA: Diagnosis not present

## 2019-03-02 DIAGNOSIS — M6281 Muscle weakness (generalized): Secondary | ICD-10-CM | POA: Diagnosis not present

## 2019-03-02 DIAGNOSIS — R630 Anorexia: Secondary | ICD-10-CM | POA: Diagnosis not present

## 2019-03-02 DIAGNOSIS — R031 Nonspecific low blood-pressure reading: Secondary | ICD-10-CM | POA: Diagnosis not present

## 2019-03-02 DIAGNOSIS — R338 Other retention of urine: Secondary | ICD-10-CM | POA: Diagnosis not present

## 2019-03-02 DIAGNOSIS — I10 Essential (primary) hypertension: Secondary | ICD-10-CM | POA: Diagnosis not present

## 2019-03-02 DIAGNOSIS — R29810 Facial weakness: Secondary | ICD-10-CM | POA: Diagnosis not present

## 2019-03-02 DIAGNOSIS — R3989 Other symptoms and signs involving the genitourinary system: Secondary | ICD-10-CM | POA: Diagnosis not present

## 2019-03-02 DIAGNOSIS — S42222D 2-part displaced fracture of surgical neck of left humerus, subsequent encounter for fracture with routine healing: Secondary | ICD-10-CM | POA: Diagnosis not present

## 2019-03-02 DIAGNOSIS — R600 Localized edema: Secondary | ICD-10-CM | POA: Diagnosis not present

## 2019-03-02 DIAGNOSIS — I69398 Other sequelae of cerebral infarction: Secondary | ICD-10-CM | POA: Diagnosis not present

## 2019-03-02 DIAGNOSIS — R2681 Unsteadiness on feet: Secondary | ICD-10-CM | POA: Diagnosis not present

## 2019-03-02 DIAGNOSIS — E118 Type 2 diabetes mellitus with unspecified complications: Secondary | ICD-10-CM | POA: Diagnosis not present

## 2019-03-02 DIAGNOSIS — Z7984 Long term (current) use of oral hypoglycemic drugs: Secondary | ICD-10-CM | POA: Diagnosis not present

## 2019-03-02 DIAGNOSIS — I4891 Unspecified atrial fibrillation: Secondary | ICD-10-CM | POA: Diagnosis not present

## 2019-03-02 DIAGNOSIS — R531 Weakness: Secondary | ICD-10-CM | POA: Diagnosis not present

## 2019-03-02 DIAGNOSIS — R278 Other lack of coordination: Secondary | ICD-10-CM | POA: Diagnosis not present

## 2019-03-02 DIAGNOSIS — I69959 Hemiplegia and hemiparesis following unspecified cerebrovascular disease affecting unspecified side: Secondary | ICD-10-CM | POA: Diagnosis not present

## 2019-03-02 LAB — URINE CULTURE: Culture: >=100000 — AB

## 2019-03-02 MED ORDER — HYDROCODONE-ACETAMINOPHEN 5-325 MG PO TABS: 1.0000 | ORAL_TABLET | Freq: Four times a day (QID) | ORAL | 0 refills | Status: AC | PRN

## 2019-03-02 MED ORDER — CIPROFLOXACIN HCL 250 MG PO TABS: 250.0000 mg | ORAL_TABLET | Freq: Two times a day (BID) | ORAL | 0 refills | Status: AC

## 2019-03-02 NOTE — Progress Notes (Signed)
Patient will discharge too: [facility] Discharge date: 03/02/2019 Family notified:   Per MD patient is appropriate for discharge and will discharge too [facility] in room 603P. RN, patient, patient's family, and facility have been notified of discharge. Assessment, FL-2, PASRR, and discharge summary sent to facillity. RN was provided with the following number for report: [phone]. CSW signing off.   Lamonte Richer, LCSW, [place] Worker II [phone]

## 2019-03-02 NOTE — ED Provider Notes (Addendum)
Patient discharged to SNF. Foley to be replaced prior to leaving ER, started on Cipro for UTI, 250mg  BID x 5 days (after culture review with pharmacy, cipro renal dose).    Tacy Learn, PA-C 03/02/19 0839    Tacy Learn, PA-C 03/02/19 0920    Sherwood Gambler, MD 03/05/19 [phone]

## 2019-03-02 NOTE — ED Notes (Signed)
Pt noted w/sling to left arm. Arm elevated for comfort.

## 2019-03-02 NOTE — ED Notes (Signed)
[facility] aware of need for [facility] to transport pt to [facility]. Report called to [place], [place], [phone] - who requested for pt to come w/rx for Norco along w/his rx for Cipro.

## 2019-03-02 NOTE — ED Notes (Addendum)
[facility] has arrived to transport pt. Pt's sling to left forearm intact and f/c intact - draining yellow urine - emptied prior to transport. D/C paperwork given to [facility] along w/Medical Necessity, MAR, and chart papers. Pt unable to sign for d/c paperwork.

## 2019-03-02 NOTE — ED Notes (Signed)
Pt temp. 97.7.

## 2019-03-02 NOTE — ED Notes (Signed)
Attempted to call report to [facility] - [phone].

## 2019-03-02 NOTE — ED Notes (Signed)
Breakfast tray ordered 

## 2019-03-02 NOTE — ED Notes (Signed)
[facility] advised pt is next on list of 3.

## 2019-03-03 ENCOUNTER — Telehealth: Payer: Self-pay | Admitting: Emergency Medicine

## 2019-03-03 NOTE — Telephone Encounter (Signed)
Post ED Visit - Positive Culture Follow-up  Culture report reviewed by antimicrobial stewardship pharmacist: [place] Team []  Elenor Quinones, Pharm.D. []  Heide Guile, Pharm.D., BCPS AQ-ID []  Parks Neptune, Pharm.D., BCPS []  Alycia Rossetti, Pharm.D., BCPS []  Senoia, Pharm.D., BCPS, AAHIVP []  Legrand Como, Pharm.D., BCPS, AAHIVP [x]  Salome Arnt, PharmD, BCPS []  Johnnette Gourd, PharmD, BCPS []  Hughes Better, PharmD, BCPS []  Leeroy Cha, PharmD []  Laqueta Linden, PharmD, BCPS []  Albertina Parr, PharmD  [place] Team []  Leodis Sias, PharmD []  Lindell Spar, PharmD []  Royetta Asal, PharmD []  Graylin Shiver, Rph []  Rema Fendt) Glennon Mac, PharmD []  Arlyn Dunning, PharmD []  Netta Cedars, PharmD []  Dia Sitter, PharmD []  Leone Haven, PharmD []  Gretta Arab, PharmD []  Theodis Shove, PharmD []  Peggyann Juba, PharmD []  Reuel Boom, PharmD   Positive urine culture Treated with Ciprofloxacin, organism sensitive to the same and no further patient follow-up is required at this time.  [place] Zuleica Seith 03/03/2019, 1:34 PM

## 2019-03-04 ENCOUNTER — Telehealth: Payer: Self-pay

## 2019-03-04 NOTE — Telephone Encounter (Signed)
Pt is on TCM list for weakness and lethargy. Pt was dc'ed on 02/28/2019 to SNF.

## 2019-03-05 ENCOUNTER — Other Ambulatory Visit: Payer: Self-pay | Admitting: *Deleted

## 2019-03-05 DIAGNOSIS — I480 Paroxysmal atrial fibrillation: Secondary | ICD-10-CM | POA: Diagnosis not present

## 2019-03-05 DIAGNOSIS — I495 Sick sinus syndrome: Secondary | ICD-10-CM | POA: Diagnosis not present

## 2019-03-05 DIAGNOSIS — S42302A Unspecified fracture of shaft of humerus, left arm, initial encounter for closed fracture: Secondary | ICD-10-CM | POA: Diagnosis not present

## 2019-03-05 DIAGNOSIS — T839XXA Unspecified complication of genitourinary prosthetic device, implant and graft, initial encounter: Secondary | ICD-10-CM | POA: Diagnosis not present

## 2019-03-05 DIAGNOSIS — N390 Urinary tract infection, site not specified: Secondary | ICD-10-CM | POA: Diagnosis not present

## 2019-03-05 LAB — CULTURE, BLOOD (ROUTINE X 2)
Culture: NO GROWTH
Culture: NO GROWTH

## 2019-03-05 NOTE — Patient Outreach (Signed)
[facility]) Care Management  03/05/2019  Kent Nolan May 25, 1929 [id number]   Per chart review, patient discharged to skilled nursing facility [facility]) on 03/02/2019.  Case closed due to patient enrolled in external program.  Internal email update sent to [name] at [facility].    David Towson H. Annia Friendly, BSN, [place] Management [facility] Telephonic CM Phone: [phone] Fax: [phone]

## 2019-03-07 DIAGNOSIS — G7089 Other specified myoneural disorders: Secondary | ICD-10-CM | POA: Diagnosis not present

## 2019-03-07 DIAGNOSIS — I480 Paroxysmal atrial fibrillation: Secondary | ICD-10-CM | POA: Diagnosis not present

## 2019-03-07 DIAGNOSIS — E118 Type 2 diabetes mellitus with unspecified complications: Secondary | ICD-10-CM | POA: Diagnosis not present

## 2019-03-07 DIAGNOSIS — R3989 Other symptoms and signs involving the genitourinary system: Secondary | ICD-10-CM | POA: Diagnosis not present

## 2019-03-07 DIAGNOSIS — S42202D Unspecified fracture of upper end of left humerus, subsequent encounter for fracture with routine healing: Secondary | ICD-10-CM | POA: Diagnosis not present

## 2019-03-11 DIAGNOSIS — G319 Degenerative disease of nervous system, unspecified: Secondary | ICD-10-CM

## 2019-03-11 DIAGNOSIS — Z7982 Long term (current) use of aspirin: Secondary | ICD-10-CM

## 2019-03-11 DIAGNOSIS — M5146 Schmorl's nodes, lumbar region: Secondary | ICD-10-CM

## 2019-03-11 DIAGNOSIS — Z9181 History of falling: Secondary | ICD-10-CM

## 2019-03-11 DIAGNOSIS — H1010 Acute atopic conjunctivitis, unspecified eye: Secondary | ICD-10-CM

## 2019-03-11 DIAGNOSIS — N281 Cyst of kidney, acquired: Secondary | ICD-10-CM

## 2019-03-11 DIAGNOSIS — J9811 Atelectasis: Secondary | ICD-10-CM

## 2019-03-11 DIAGNOSIS — M5127 Other intervertebral disc displacement, lumbosacral region: Secondary | ICD-10-CM

## 2019-03-11 DIAGNOSIS — M170 Bilateral primary osteoarthritis of knee: Secondary | ICD-10-CM

## 2019-03-11 DIAGNOSIS — I480 Paroxysmal atrial fibrillation: Secondary | ICD-10-CM

## 2019-03-11 DIAGNOSIS — Z7901 Long term (current) use of anticoagulants: Secondary | ICD-10-CM

## 2019-03-11 DIAGNOSIS — M2578 Osteophyte, vertebrae: Secondary | ICD-10-CM

## 2019-03-11 DIAGNOSIS — Z96649 Presence of unspecified artificial hip joint: Secondary | ICD-10-CM

## 2019-03-11 DIAGNOSIS — E1122 Type 2 diabetes mellitus with diabetic chronic kidney disease: Secondary | ICD-10-CM

## 2019-03-11 DIAGNOSIS — M438X6 Other specified deforming dorsopathies, lumbar region: Secondary | ICD-10-CM

## 2019-03-11 DIAGNOSIS — Z87891 Personal history of nicotine dependence: Secondary | ICD-10-CM

## 2019-03-11 DIAGNOSIS — N390 Urinary tract infection, site not specified: Secondary | ICD-10-CM | POA: Diagnosis not present

## 2019-03-11 DIAGNOSIS — I495 Sick sinus syndrome: Secondary | ICD-10-CM

## 2019-03-11 DIAGNOSIS — I129 Hypertensive chronic kidney disease with stage 1 through stage 4 chronic kidney disease, or unspecified chronic kidney disease: Secondary | ICD-10-CM

## 2019-03-11 DIAGNOSIS — I2510 Atherosclerotic heart disease of native coronary artery without angina pectoris: Secondary | ICD-10-CM

## 2019-03-11 DIAGNOSIS — I69398 Other sequelae of cerebral infarction: Secondary | ICD-10-CM

## 2019-03-11 DIAGNOSIS — R339 Retention of urine, unspecified: Secondary | ICD-10-CM

## 2019-03-11 DIAGNOSIS — Z7984 Long term (current) use of oral hypoglycemic drugs: Secondary | ICD-10-CM

## 2019-03-11 DIAGNOSIS — K861 Other chronic pancreatitis: Secondary | ICD-10-CM

## 2019-03-11 DIAGNOSIS — I69318 Other symptoms and signs involving cognitive functions following cerebral infarction: Secondary | ICD-10-CM

## 2019-03-11 DIAGNOSIS — S42222D 2-part displaced fracture of surgical neck of left humerus, subsequent encounter for fracture with routine healing: Secondary | ICD-10-CM | POA: Diagnosis not present

## 2019-03-11 DIAGNOSIS — R2689 Other abnormalities of gait and mobility: Secondary | ICD-10-CM

## 2019-03-11 DIAGNOSIS — Z79891 Long term (current) use of opiate analgesic: Secondary | ICD-10-CM

## 2019-03-11 DIAGNOSIS — H9190 Unspecified hearing loss, unspecified ear: Secondary | ICD-10-CM

## 2019-03-11 DIAGNOSIS — K219 Gastro-esophageal reflux disease without esophagitis: Secondary | ICD-10-CM

## 2019-03-11 DIAGNOSIS — M4186 Other forms of scoliosis, lumbar region: Secondary | ICD-10-CM

## 2019-03-11 DIAGNOSIS — E669 Obesity, unspecified: Secondary | ICD-10-CM

## 2019-03-11 DIAGNOSIS — M48061 Spinal stenosis, lumbar region without neurogenic claudication: Secondary | ICD-10-CM

## 2019-03-11 DIAGNOSIS — N189 Chronic kidney disease, unspecified: Secondary | ICD-10-CM

## 2019-03-11 DIAGNOSIS — L22 Diaper dermatitis: Secondary | ICD-10-CM

## 2019-03-11 DIAGNOSIS — Z6828 Body mass index (BMI) 28.0-28.9, adult: Secondary | ICD-10-CM

## 2019-03-11 DIAGNOSIS — Z950 Presence of cardiac pacemaker: Secondary | ICD-10-CM

## 2019-03-11 DIAGNOSIS — L89312 Pressure ulcer of right buttock, stage 2: Secondary | ICD-10-CM | POA: Diagnosis not present

## 2019-03-11 DIAGNOSIS — I700 Atherosclerosis of aorta: Secondary | ICD-10-CM

## 2019-03-11 DIAGNOSIS — M47816 Spondylosis without myelopathy or radiculopathy, lumbar region: Secondary | ICD-10-CM

## 2019-03-11 DIAGNOSIS — E785 Hyperlipidemia, unspecified: Secondary | ICD-10-CM

## 2019-03-11 DIAGNOSIS — Z466 Encounter for fitting and adjustment of urinary device: Secondary | ICD-10-CM | POA: Diagnosis not present

## 2019-03-12 DIAGNOSIS — R600 Localized edema: Secondary | ICD-10-CM | POA: Diagnosis not present

## 2019-03-12 DIAGNOSIS — R338 Other retention of urine: Secondary | ICD-10-CM | POA: Diagnosis not present

## 2019-03-12 DIAGNOSIS — R031 Nonspecific low blood-pressure reading: Secondary | ICD-10-CM | POA: Diagnosis not present

## 2019-03-12 DIAGNOSIS — K5901 Slow transit constipation: Secondary | ICD-10-CM | POA: Diagnosis not present

## 2019-03-13 ENCOUNTER — Encounter: Payer: PPO | Admitting: *Deleted

## 2019-03-14 ENCOUNTER — Telehealth: Payer: Self-pay

## 2019-03-14 NOTE — Telephone Encounter (Signed)
Left message for patient to remind of missed remote transmission.  

## 2019-03-15 ENCOUNTER — Telehealth: Payer: Self-pay | Admitting: Cardiology

## 2019-03-15 NOTE — Telephone Encounter (Signed)
Patient wife called and stated that pt did not do his remote transmission on 7/8 b/c he is not home at this time. She will call and reschedule when she finds out when the patient will be home.

## 2019-03-21 DIAGNOSIS — R338 Other retention of urine: Secondary | ICD-10-CM | POA: Diagnosis not present

## 2019-03-21 DIAGNOSIS — S42202D Unspecified fracture of upper end of left humerus, subsequent encounter for fracture with routine healing: Secondary | ICD-10-CM | POA: Diagnosis not present

## 2019-03-21 DIAGNOSIS — R600 Localized edema: Secondary | ICD-10-CM | POA: Diagnosis not present

## 2019-03-21 DIAGNOSIS — I480 Paroxysmal atrial fibrillation: Secondary | ICD-10-CM | POA: Diagnosis not present

## 2019-03-21 DIAGNOSIS — I6389 Other cerebral infarction: Secondary | ICD-10-CM | POA: Diagnosis not present

## 2019-03-22 ENCOUNTER — Emergency Department (HOSPITAL_COMMUNITY): Payer: PPO

## 2019-03-22 ENCOUNTER — Encounter (HOSPITAL_COMMUNITY): Payer: Self-pay | Admitting: Emergency Medicine

## 2019-03-22 ENCOUNTER — Emergency Department (HOSPITAL_COMMUNITY)
Admission: EM | Admit: 2019-03-22 | Discharge: 2019-03-22 | Disposition: A | Discharge status: Home / Self Care | Payer: PPO | Attending: Emergency Medicine | Admitting: Emergency Medicine

## 2019-03-22 ENCOUNTER — Other Ambulatory Visit: Payer: Self-pay

## 2019-03-22 DIAGNOSIS — Z7984 Long term (current) use of oral hypoglycemic drugs: Secondary | ICD-10-CM | POA: Diagnosis not present

## 2019-03-22 DIAGNOSIS — R8271 Bacteriuria: Secondary | ICD-10-CM | POA: Diagnosis not present

## 2019-03-22 DIAGNOSIS — I4891 Unspecified atrial fibrillation: Secondary | ICD-10-CM | POA: Diagnosis not present

## 2019-03-22 DIAGNOSIS — R29898 Other symptoms and signs involving the musculoskeletal system: Secondary | ICD-10-CM

## 2019-03-22 DIAGNOSIS — I252 Old myocardial infarction: Secondary | ICD-10-CM | POA: Insufficient documentation

## 2019-03-22 DIAGNOSIS — Z7982 Long term (current) use of aspirin: Secondary | ICD-10-CM | POA: Diagnosis not present

## 2019-03-22 DIAGNOSIS — Z79899 Other long term (current) drug therapy: Secondary | ICD-10-CM | POA: Insufficient documentation

## 2019-03-22 DIAGNOSIS — Z950 Presence of cardiac pacemaker: Secondary | ICD-10-CM | POA: Insufficient documentation

## 2019-03-22 DIAGNOSIS — R4182 Altered mental status, unspecified: Secondary | ICD-10-CM | POA: Diagnosis not present

## 2019-03-22 DIAGNOSIS — I959 Hypotension, unspecified: Secondary | ICD-10-CM | POA: Diagnosis not present

## 2019-03-22 DIAGNOSIS — I6389 Other cerebral infarction: Secondary | ICD-10-CM | POA: Diagnosis not present

## 2019-03-22 DIAGNOSIS — E119 Type 2 diabetes mellitus without complications: Secondary | ICD-10-CM | POA: Insufficient documentation

## 2019-03-22 DIAGNOSIS — I480 Paroxysmal atrial fibrillation: Secondary | ICD-10-CM | POA: Diagnosis not present

## 2019-03-22 DIAGNOSIS — R29810 Facial weakness: Secondary | ICD-10-CM | POA: Diagnosis not present

## 2019-03-22 DIAGNOSIS — I2510 Atherosclerotic heart disease of native coronary artery without angina pectoris: Secondary | ICD-10-CM | POA: Insufficient documentation

## 2019-03-22 DIAGNOSIS — I10 Essential (primary) hypertension: Secondary | ICD-10-CM | POA: Diagnosis not present

## 2019-03-22 DIAGNOSIS — M6281 Muscle weakness (generalized): Secondary | ICD-10-CM | POA: Diagnosis not present

## 2019-03-22 DIAGNOSIS — R531 Weakness: Secondary | ICD-10-CM | POA: Diagnosis not present

## 2019-03-22 LAB — CBC WITH DIFFERENTIAL/PLATELET
Abs Immature Granulocytes: 0.02 10*3/uL (ref 0.00–0.07)
Basophils Absolute: 0 10*3/uL (ref 0.0–0.1)
Basophils Relative: 1 %
Eosinophils Absolute: 0.3 10*3/uL (ref 0.0–0.5)
Eosinophils Relative: 5 %
HCT: 38.6 % — ABNORMAL LOW (ref 39.0–52.0)
Hemoglobin: 12.3 g/dL — ABNORMAL LOW (ref 13.0–17.0)
Immature Granulocytes: 0 %
Lymphocytes Relative: 13 %
Lymphs Abs: 0.7 10*3/uL (ref 0.7–4.0)
MCH: 30.8 pg (ref 26.0–34.0)
MCHC: 31.9 g/dL (ref 30.0–36.0)
MCV: 96.5 fL (ref 80.0–100.0)
Monocytes Absolute: 0.5 10*3/uL (ref 0.1–1.0)
Monocytes Relative: 10 %
Neutro Abs: 3.9 10*3/uL (ref 1.7–7.7)
Neutrophils Relative %: 71 %
Platelets: 176 10*3/uL (ref 150–400)
RBC: 4 MIL/uL — ABNORMAL LOW (ref 4.22–5.81)
RDW: 13.8 % (ref 11.5–15.5)
WBC: 5.4 10*3/uL (ref 4.0–10.5)
nRBC: 0 % (ref 0.0–0.2)

## 2019-03-22 LAB — URINALYSIS, ROUTINE W REFLEX MICROSCOPIC
Bilirubin Urine: NEGATIVE
Glucose, UA: NEGATIVE mg/dL
Ketones, ur: NEGATIVE mg/dL
Nitrite: POSITIVE — AB
Protein, ur: 100 mg/dL — AB
RBC / HPF: >50 RBC/hpf — ABNORMAL HIGH (ref 0–5)
Specific Gravity, Urine: 1.02 (ref 1.005–1.030)
WBC, UA: >50 WBC/hpf — ABNORMAL HIGH (ref 0–5)
pH: 5 (ref 5.0–8.0)

## 2019-03-22 LAB — BASIC METABOLIC PANEL
Anion gap: 7 (ref 5–15)
BUN: 16 mg/dL (ref 8–23)
CO2: 25 mmol/L (ref 22–32)
Calcium: 8.9 mg/dL (ref 8.9–10.3)
Chloride: 101 mmol/L (ref 98–111)
Creatinine, Ser: 1.39 mg/dL — ABNORMAL HIGH (ref 0.61–1.24)
GFR calc Af Amer: 52 mL/min — ABNORMAL LOW (ref >60)
GFR calc non Af Amer: 45 mL/min — ABNORMAL LOW (ref >60)
Glucose, Bld: 137 mg/dL — ABNORMAL HIGH (ref 70–99)
Potassium: 3.8 mmol/L (ref 3.5–5.1)
Sodium: 133 mmol/L — ABNORMAL LOW (ref 135–145)

## 2019-03-22 NOTE — ED Provider Notes (Signed)
[facility]   CSN: [id number] Arrival date & time: 03/22/19  1633     History   Chief Complaint Chief Complaint  Patient presents with   Weakness    HPI Kent Nolan is a 85 y.o. male.         Patient states he has been having periodic weakness in his right arm today.  He was seen by his doctor also noticed some weakness in that arm and he is back to normal now  The history is provided by the patient. No language interpreter was used.  Weakness Severity:  Mild Onset quality:  Sudden Timing:  Intermittent Progression:  Waxing and waning Chronicity:  New Context: not alcohol use   Relieved by:  Nothing Worsened by:  Nothing Ineffective treatments:  None tried Associated symptoms: no abdominal pain, no chest pain, no cough, no diarrhea, no frequency, no headaches and no seizures   Risk factors: no anemia     Past Medical History:  Diagnosis Date   BRADYCARDIA 08/04/2010   s/p PPM since 2011 - generator change 12/03/13   Chronic kidney disease    followed by nephrology   Coronary artery disease    remote subendo MI in February 2005 with stents to RCA &LCX; s/p scute thrombosis of both stents in February 2010 with cardiogenic shock - s/p BMS to both lesions; follow up cath in September 2010 was satisfactory.  He is medically managed with aspirin/Plavix   GERD (gastroesophageal reflux disease)    High risk medication use    on amiodarone for PAF/VT since February 2010   HTN (hypertension)    Hyperlipidemia    Obesity    Pacemaker    PAF (paroxysmal atrial fibrillation) (HCC)    s/p cardioversion in August 2011   Stroke [facility](HCC)    Type II or unspecified type diabetes mellitus without mention of complication, uncontrolled     Patient Active Problem List   Diagnosis Date Noted   Bed sore on buttock 02/23/2019   Left humeral fracture 02/21/2019   Bruising 02/21/2019   Peripheral edema  02/21/2019   Fall 02/15/2019   Allergic conjunctivitis 08/14/2018   Gait disturbance, post-stroke    Cognitive deficit, post-stroke    Stroke (cerebrum) (HCC) 10/23/2017   Middle cerebral artery embolism, 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   Lower back pain 03/30/2014   Encounter for well adult exam with abnormal findings 11/26/2013   Diabetes (HCC) 11/26/2013   Coronary artery disease    PAF (paroxysmal atrial fibrillation) (HCC)    GERD (gastroesophageal reflux disease)    Hyperlipidemia    Chronic kidney disease    Pacemaker    Essential hypertension, benign 08/04/2010   Sick sinus syndrome (HCC) 08/04/2010    Past Surgical History:  Procedure Laterality Date   EP IMPLANTABLE DEVICE N/A 02/25/2015   Procedure: Lead Revision/Repair;  Surgeon: Hillis RangeJames Allred, MD;  Location: [facility] INVASIVE CV LAB;  Service: Cardiovascular;  Laterality: N/A;   IR CT HEAD LTD  10/23/2017   IR PERCUTANEOUS ART THROMBECTOMY/INFUSION INTRACRANIAL INC DIAG ANGIO  10/23/2017   PACEMAKER GENERATOR CHANGE  12/03/13   MDT Adapta L generator change for premature ERI by [name]   PACEMAKER INSERTION  May 2011   PERMANENT PACEMAKER GENERATOR CHANGE N/A 12/03/2013   Procedure: PERMANENT PACEMAKER GENERATOR CHANGE;  Surgeon: Gardiner RhymeJames D Allred, MD;  Location: [facility] CATH LAB;  Service: Cardiovascular;  Laterality: N/A;   RADIOLOGY WITH ANESTHESIA N/A  10/23/2017   Procedure: RADIOLOGY WITH ANESTHESIA;  Surgeon: Luanne Bras, MD;  Location: [place];  Service: Radiology;  Laterality: N/A;        Home Medications    Prior to Admission medications   Medication Sig Start Date End Date Taking? Authorizing Provider  acetaminophen (TYLENOL) 500 MG tablet Take 500 mg by mouth every 6 (six) hours as needed for moderate pain.    [provider]  amiodarone (PACERONE) 200 MG tablet TAKE 1/2 TABLET BY MOUTH EVERY DAY Patient not taking: Reported on 02/28/2019 12/17/18   Burtis Junes, NP  aspirin EC 81 MG tablet Take 81 mg by mouth daily.    [provider]  azelastine (OPTIVAR) 0.05 % ophthalmic solution Place 1 drop into both eyes 2 (two) times daily. Patient taking differently: Place 1 drop into both eyes 2 (two) times daily as needed (dry eyes).  08/14/18   Biagio Borg, MD  Blood Glucose Monitoring Suppl (FREESTYLE LITE) DEVI Use as directed once daily E11.9 02/15/19   Biagio Borg, MD  ELIQUIS 2.5 MG TABS tablet TAKE 1 TABLET BY MOUTH TWICE A DAY Patient taking differently: Take 2.5 mg by mouth 2 (two) times daily.  06/19/18   Allred, Jeneen Rinks, MD  famotidine (PEPCID) 40 MG tablet TAKE 1 TABLET BY MOUTH EVERY DAY Patient taking differently: Take 40 mg by mouth every evening.  06/28/18   Allred, Jeneen Rinks, MD  fenofibrate (TRICOR) 145 MG tablet TAKE 1 TABLET BY MOUTH EVERY DAY Patient taking differently: Take 145 mg by mouth daily.  05/11/18   Allred, Jeneen Rinks, MD  glipiZIDE (GLUCOTROL XL) 2.5 MG 24 hr tablet Take 1 tablet (2.5 mg total) by mouth daily with breakfast. 08/14/18   Biagio Borg, MD  glucose blood (FREESTYLE LITE) test strip Use as instructed once daily E11.9 02/15/19   Biagio Borg, MD  HYDROcodone-acetaminophen (NORCO/VICODIN) 5-325 MG tablet Take 1 tablet by mouth every 6 (six) hours as needed for moderate pain. 02/21/19   Biagio Borg, MD  iron polysaccharides (NU-IRON) 150 MG capsule Take 1 capsule (150 mg total) by mouth daily. Patient not taking: Reported on 02/22/2019 02/21/19   Biagio Borg, MD  Lancets MISC Use as directed once daily E11.9 02/15/19   Biagio Borg, MD  lidocaine (LIDODERM) 5 % Place 1 patch onto the skin daily. Remove & Discard patch within 12 hours or as directed by MD Patient not taking: Reported on 02/22/2019 04/11/18   Biagio Borg, MD  Multiple Vitamin (MULTIVITAMIN) tablet Take 1 tablet by mouth daily.      [provider]  nitroGLYCERIN (NITROSTAT) 0.4 MG SL tablet Place 1 tablet (0.4 mg total) under the tongue  every 5 (five) minutes as needed. For chest pain 02/27/15   Allred, Jeneen Rinks, MD  rosuvastatin (CRESTOR) 20 MG tablet Take 1 tablet (20 mg total) by mouth daily. 07/18/18   Thompson Grayer, MD    Family History Family History  Problem Relation Age of Onset   Heart disease Mother    Stomach cancer Father    Diabetes Father     Social History Social History   Tobacco Use   Smoking status: Former Smoker    Packs/day: 3.00    Types: Cigarettes   Smokeless tobacco: Never Used   Tobacco comment: back in the [place]  Substance Use Topics   Alcohol use: No   Drug use: No     Allergies   Penicillins, Sulfonamide derivatives, and Tramadol  Review of Systems Review of Systems  Constitutional: Negative for appetite change and fatigue.  HENT: Negative for congestion, ear discharge and sinus pressure.   Eyes: Negative for discharge.  Respiratory: Negative for cough.   Cardiovascular: Negative for chest pain.  Gastrointestinal: Negative for abdominal pain and diarrhea.  Genitourinary: Negative for frequency and hematuria.  Musculoskeletal: Negative for back pain.  Skin: Negative for rash.  Neurological: Positive for weakness. Negative for seizures and headaches.  Psychiatric/Behavioral: Negative for hallucinations.     Physical Exam Updated Vital Signs BP (!) 103/51    Pulse 67    Temp 97.9 F (36.6 C) (Oral)    Resp 19    SpO2 98%   Physical Exam Vitals signs and nursing note reviewed.  Constitutional:      Appearance: He is well-developed.  HENT:     Head: Normocephalic.     Nose: Nose normal.  Eyes:     General: No scleral icterus.    Conjunctiva/sclera: Conjunctivae normal.  Neck:     Musculoskeletal: Neck supple.     Thyroid: No thyromegaly.  Cardiovascular:     Rate and Rhythm: Normal rate and regular rhythm.     Heart sounds: No murmur. No friction rub. No gallop.   Pulmonary:     Breath sounds: No stridor. No wheezing or rales.  Chest:     Chest  wall: No tenderness.  Abdominal:     General: There is no distension.     Tenderness: There is no abdominal tenderness. There is no rebound.  Musculoskeletal: Normal range of motion.  Lymphadenopathy:     Cervical: No cervical adenopathy.  Skin:    Findings: No erythema or rash.  Neurological:     Mental Status: He is oriented to person, place, and time.     Motor: No abnormal muscle tone.     Coordination: Coordination normal.  Psychiatric:        Behavior: Behavior normal.      ED Treatments / Results  Labs (all labs ordered are listed, but only abnormal results are displayed) Labs Reviewed  CBC WITH DIFFERENTIAL/PLATELET - Abnormal; Notable for the following components:      Result Value   RBC 4.00 (*)    Hemoglobin 12.3 (*)    HCT 38.6 (*)    All other components within normal limits  BASIC METABOLIC PANEL - Abnormal; Notable for the following components:   Sodium 133 (*)    Glucose, Bld 137 (*)    Creatinine, Ser 1.39 (*)    GFR calc non Af Amer 45 (*)    GFR calc Af Amer 52 (*)    All other components within normal limits  URINALYSIS, ROUTINE W REFLEX MICROSCOPIC    EKG None  Radiology No results found.  Procedures Procedures (including critical care time)  Medications Ordered in ED Medications - No data to display   Initial Impression / Assessment and Plan / ED Course  I have reviewed the triage vital signs and the nursing notes.  Pertinent labs & imaging results that were available during my care of the patient were reviewed by me and considered in my medical decision making (see chart for details).        Patient with periodic weakness that has improved in his right arm. Ct shows nad,  Neurology stated pt can be discharged without mri  Final Clinical Impressions(s) / ED Diagnoses   Final diagnoses:  None    ED Discharge Orders  None       Bethann BerkshireZammit, Allizon Woznick, MD 03/23/19 [phone]

## 2019-03-22 NOTE — ED Provider Notes (Signed)
Received patient at signout from Dr. Roderic Palau.  Refer to provider note for full history and physical examination.  Briefly, patient is an 85 year old male with history of residual left-sided deficits from previous stroke coming in with intermittent weakness of the right upper extremity.  He has no weakness at this time.  Head CT shows no acute intracranial abnormality and Dr. Malen Gauze was consulted who recommends follow-up with the patient's neurologist on an outpatient basis in the near future.  Pending UA but patient likely stable for discharge. Physical Exam  BP (!) 103/51   Pulse 88   Temp 97.9 F (36.6 C) (Oral)   Resp 16   SpO2 93%   Physical Exam Vitals signs and nursing note reviewed.  Constitutional:      General: He is not in acute distress.    Appearance: He is well-developed.  HENT:     Head: Normocephalic and atraumatic.     Comments: Hard of hearing Eyes:     General:        Right eye: No discharge.        Left eye: No discharge.     Conjunctiva/sclera: Conjunctivae normal.  Neck:     Vascular: No JVD.     Trachea: No tracheal deviation.  Cardiovascular:     Rate and Rhythm: Normal rate.  Pulmonary:     Effort: Pulmonary effort is normal.  Abdominal:     General: There is no distension.     Comments: Chronic indwelling Foley catheter  Skin:    General: Skin is warm and dry.     Findings: No erythema.  Neurological:     Mental Status: He is alert.  Psychiatric:        Behavior: Behavior normal.       MDM  UA suggestive of UTI.  Record review shows that he was recently treated for a UTI with Rocephin and Cipro on 02/28/2019.  Urine culture shows growth of Enterococcus faecalis that was only susceptible to ampicillin, levofloxacin, nitrofurantoin, and vancomycin.  The patient has a childhood penicillin allergy. 7:35 PM Spoke with Hildred Alamin with pharmacy.  Given patient's renal insufficiency and age he is not a good candidate for any of the antibiotics his last urine  culture was susceptible to.  This is likely asymptomatic bacteriuria from his chronic indwelling Foley catheter.  I have verified with patient and he denies any urinary symptoms.  We will elect to hold off on any treatment, follow-up on culture and do a symptom check on the patient in the next few days to ensure he does not require antibiotic treatment at this time.  Presently it does not seem indicated.  Patient stable for discharge home with follow-up with his neurologist as discussed above.      Renita Papa, PA-C 03/22/19 1942    Milton Ferguson, MD 03/23/19 1535

## 2019-03-22 NOTE — ED Notes (Signed)
Notified wife of plan to d/c

## 2019-03-22 NOTE — ED Triage Notes (Signed)
Pt from [facility] and rehab, pt noticed intermittent right arm weakness 3-4 hours ago, was assessed by doctor at 1330 who noticed it as well.  Pt has hx of stroke and has left sided deficits with dysphagia at baseline.  Foley present. Pt A&O x4.

## 2019-03-22 NOTE — Discharge Instructions (Signed)
Your work-up today did not show any sign of stroke.  Your urine showed signs of possible infection but this is likely normal due to your Foley catheter.  We will culture the urine and hold off on any antibiotic treatment at this time but please seek medical evaluation if you develop any urinary symptoms, fever, abdominal pain, or vomiting.  Follow up with your neurologist for reevaluation of your arm weakness.  Return to the emergency department if any concerning signs or symptoms develop.

## 2019-03-22 NOTE — ED Notes (Signed)
Urine culture sent down with UA. 

## 2019-03-22 NOTE — ED Notes (Signed)
Pt wife called and spoke with him

## 2019-03-24 LAB — URINE CULTURE: Culture: >=100000 — AB

## 2019-03-25 ENCOUNTER — Telehealth: Payer: Self-pay | Admitting: Emergency Medicine

## 2019-03-25 NOTE — Telephone Encounter (Signed)
Post ED Visit - Positive Culture Follow-up  Culture report reviewed by antimicrobial stewardship pharmacist: [facility] []  Elenor Quinones, Pharm.D. []  Heide Guile, Pharm.D., BCPS AQ-ID []  Parks Neptune, Pharm.D., BCPS []  Alycia Rossetti, Pharm.D., BCPS []  [place], [place].D., BCPS, AAHIVP []  Legrand Como, Pharm.D., BCPS, AAHIVP []  Salome Arnt, PharmD, BCPS []  Johnnette Gourd, PharmD, BCPS []  Hughes Better, PharmD, BCPS []  Leeroy Cha, PharmD []  Laqueta Linden, PharmD, BCPS []  Albertina Parr, PharmD  [facility] Team []  Leodis Sias, PharmD []  Lindell Spar, PharmD []  Royetta Asal, PharmD []  Graylin Shiver, Rph []  Rema Fendt) Glennon Mac, PharmD []  Arlyn Dunning, PharmD []  Netta Cedars, PharmD []  Dia Sitter, PharmD []  Leone Haven, PharmD []  Gretta Arab, PharmD []  Theodis Shove, PharmD []  Peggyann Juba, PharmD []  Reuel Boom, PharmD   Positive urine culture Treated with none, asymptomatic, no further patient follow-up is required at this time.  Hazle Nordmann 03/25/2019, 10:07 AM

## 2019-03-27 DIAGNOSIS — T839XXA Unspecified complication of genitourinary prosthetic device, implant and graft, initial encounter: Secondary | ICD-10-CM | POA: Diagnosis not present

## 2019-03-27 DIAGNOSIS — I480 Paroxysmal atrial fibrillation: Secondary | ICD-10-CM | POA: Diagnosis not present

## 2019-03-27 DIAGNOSIS — R630 Anorexia: Secondary | ICD-10-CM | POA: Diagnosis not present

## 2019-03-27 DIAGNOSIS — I69959 Hemiplegia and hemiparesis following unspecified cerebrovascular disease affecting unspecified side: Secondary | ICD-10-CM | POA: Diagnosis not present

## 2019-03-27 DIAGNOSIS — E119 Type 2 diabetes mellitus without complications: Secondary | ICD-10-CM | POA: Diagnosis not present

## 2019-03-27 DIAGNOSIS — I6389 Other cerebral infarction: Secondary | ICD-10-CM | POA: Diagnosis not present

## 2019-03-27 DIAGNOSIS — R45851 Suicidal ideations: Secondary | ICD-10-CM | POA: Diagnosis not present

## 2019-03-27 DIAGNOSIS — F320 Major depressive disorder, single episode, mild: Secondary | ICD-10-CM | POA: Diagnosis not present

## 2019-03-27 DIAGNOSIS — F322 Major depressive disorder, single episode, severe without psychotic features: Secondary | ICD-10-CM | POA: Diagnosis not present

## 2019-03-27 DIAGNOSIS — G709 Myoneural disorder, unspecified: Secondary | ICD-10-CM | POA: Diagnosis not present

## 2019-03-27 DIAGNOSIS — W19XXXA Unspecified fall, initial encounter: Secondary | ICD-10-CM | POA: Diagnosis not present

## 2019-03-28 DIAGNOSIS — I6389 Other cerebral infarction: Secondary | ICD-10-CM | POA: Diagnosis not present

## 2019-03-28 DIAGNOSIS — R29898 Other symptoms and signs involving the musculoskeletal system: Secondary | ICD-10-CM | POA: Diagnosis not present

## 2019-03-28 DIAGNOSIS — S42202D Unspecified fracture of upper end of left humerus, subsequent encounter for fracture with routine healing: Secondary | ICD-10-CM | POA: Diagnosis not present

## 2019-03-28 DIAGNOSIS — R45851 Suicidal ideations: Secondary | ICD-10-CM | POA: Diagnosis not present

## 2019-03-28 DIAGNOSIS — R338 Other retention of urine: Secondary | ICD-10-CM | POA: Diagnosis not present

## 2019-03-28 DIAGNOSIS — F320 Major depressive disorder, single episode, mild: Secondary | ICD-10-CM | POA: Diagnosis not present

## 2019-04-01 ENCOUNTER — Ambulatory Visit (INDEPENDENT_AMBULATORY_CARE_PROVIDER_SITE_OTHER): Payer: PPO | Admitting: Internal Medicine

## 2019-04-01 ENCOUNTER — Other Ambulatory Visit: Payer: Self-pay

## 2019-04-01 ENCOUNTER — Encounter: Payer: Self-pay | Admitting: Internal Medicine

## 2019-04-01 VITALS — BP 122/82 | HR 79 | Temp 97.9°F | Ht 67.0 in

## 2019-04-01 DIAGNOSIS — G8929 Other chronic pain: Secondary | ICD-10-CM | POA: Diagnosis not present

## 2019-04-01 DIAGNOSIS — E119 Type 2 diabetes mellitus without complications: Secondary | ICD-10-CM

## 2019-04-01 DIAGNOSIS — Z978 Presence of other specified devices: Secondary | ICD-10-CM

## 2019-04-01 DIAGNOSIS — M5442 Lumbago with sciatica, left side: Secondary | ICD-10-CM | POA: Diagnosis not present

## 2019-04-01 DIAGNOSIS — I6601 Occlusion and stenosis of right middle cerebral artery: Secondary | ICD-10-CM

## 2019-04-01 DIAGNOSIS — E785 Hyperlipidemia, unspecified: Secondary | ICD-10-CM

## 2019-04-01 DIAGNOSIS — Z960 Presence of urogenital implants: Secondary | ICD-10-CM | POA: Diagnosis not present

## 2019-04-01 DIAGNOSIS — I69319 Unspecified symptoms and signs involving cognitive functions following cerebral infarction: Secondary | ICD-10-CM | POA: Diagnosis not present

## 2019-04-01 DIAGNOSIS — L89309 Pressure ulcer of unspecified buttock, unspecified stage: Secondary | ICD-10-CM

## 2019-04-01 DIAGNOSIS — I63411 Cerebral infarction due to embolism of right middle cerebral artery: Secondary | ICD-10-CM

## 2019-04-01 NOTE — Assessment & Plan Note (Signed)
Stable, for neurology f/u

## 2019-04-01 NOTE — Assessment & Plan Note (Signed)
Urged daughter to make urology appt which can be some time delayed during the pandemic

## 2019-04-01 NOTE — Assessment & Plan Note (Signed)
To add wound care consult with [facility]

## 2019-04-01 NOTE — Assessment & Plan Note (Signed)
For neurology f/u

## 2019-04-01 NOTE — Assessment & Plan Note (Signed)
Also d/w family in the event of further worsening overall, we can consider stopping the statin, but doubt this is a significant contributor to his weakness

## 2019-04-01 NOTE — Assessment & Plan Note (Signed)
For neurology f/u 

## 2019-04-01 NOTE — Assessment & Plan Note (Addendum)
Post slide to ground, exam ok, declines films today  Note:  Total time for pt hx, exam, review of record with pt in the room, determination of diagnoses and plan for further eval and tx is > 40 min, with over 50% spent in coordination and counseling of patient including the differential dx, tx, further evaluation and other management of LBP, bedsores, DM, HLD, cognitive impairment and stroke

## 2019-04-01 NOTE — Patient Instructions (Addendum)
OK to stop the glipizide ER 2.5 mg per day  Please call for the Urology appt asap  You will be contacted regarding the referral for: Neurology - [facility]  Please continue all other medications as before, and refills have been done if requested.  Please have the pharmacy call with any other refills you may need.  Please continue your efforts at being more active, low cholesterol diet, and weight control.  You are otherwise up to date with prevention measures today.  Please keep your appointments with your specialists as you may have planned  You will be contacted regarding the referral for: Wound care with [facility]  Please return in 3 months, or sooner if needed

## 2019-04-01 NOTE — Assessment & Plan Note (Signed)
overcontrolled for his condition and recent wt loss,  Lab Results  Component Value Date   HGBA1C 5.8 02/21/2019  goal a1c should be less than 7.5, so ok to d/c glipizide
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
## 2019-04-02 ENCOUNTER — Telehealth: Payer: Self-pay

## 2019-04-02 ENCOUNTER — Ambulatory Visit: Payer: Self-pay | Admitting: Internal Medicine

## 2019-04-02 NOTE — Telephone Encounter (Signed)
Noted  

## 2019-04-02 NOTE — Telephone Encounter (Signed)
Pt's daughter Neoma Laming calling. States noted foley bag (at bottom) leaking. Verified not leaking from around penis.  States small spot of bright red blood on depends at foley insertion area, noted yesterday.  No blood in urine, foley bag. States "Some irritation around insertion site.  States could have been pulled on yesterday. Pt has aide with [facility] with him now.  Instructed to have aide call and report to [facility]; may need nurse to come out to change out foley. Daughter verbalizes understanding.  INstructed to CB if needed. Assured [place] would alert practice for Dr. Gwynn Burly review.    CB# [phone]  Reason for Disposition . [1] Catheter is broken or cracked AND [2] is still usable  Answer Assessment - Initial Assessment Questions 1. SYMPTOMS: "What symptoms are you concerned about?"     Foley bag leaking (At bottom) and smal spot of blood on depends 2. ONSET:  "When did the symptoms start?"    Yesterday 3. FEVER: "Is there a fever?" If so, ask: "What is the temperature, how was it measured, and when did it start?"     no 4. ABDOMINAL PAIN: "Is there any abdominal pain?" (e.g., Scale 1-10; or mild, moderate, severe)     no 5. URINE COLOR: "What color is the urine?"  "Is there blood present in the urine?" (e.g., clear, yellow, cloudy, tea-colored, blood streaks, bright red)     WNL 6. ONSET: "When was the catheter inserted?"     Unsure, possibly 3 weeks ago at nursing home 7. OTHER SYMPTOMS: "Do you have any other symptoms?" (e.g., back pain, bad urine odor)      Irritation around penis  Protocols used: [place]

## 2019-04-02 NOTE — Telephone Encounter (Signed)
Called Debra, LVM. [place] for [employer].  Copied from [place] [phone]. Topic: General - Other >> Apr 02, 2019  9:59 AM Rainey Pines A wrote: Lavina Hamman called to get verbal order for wound care and changing the bag and for PT for patient

## 2019-04-03 ENCOUNTER — Other Ambulatory Visit: Payer: Self-pay
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
## 2019-04-08 ENCOUNTER — Other Ambulatory Visit: Payer: Self-pay

## 2019-04-08 DIAGNOSIS — R6889 Other general symptoms and signs: Secondary | ICD-10-CM | POA: Diagnosis not present

## 2019-04-08 DIAGNOSIS — Z20822 Contact with and (suspected) exposure to covid-19: Secondary | ICD-10-CM

## 2019-04-09 LAB — NOVEL CORONAVIRUS, NAA: SARS-CoV-2, NAA: NOT DETECTED

## 2019-04-17 ENCOUNTER — Other Ambulatory Visit: Payer: PPO

## 2019-04-18 ENCOUNTER — Encounter: Payer: Self-pay | Admitting: *Deleted

## 2019-04-22 DIAGNOSIS — G709 Myoneural disorder, unspecified: Secondary | ICD-10-CM | POA: Diagnosis not present

## 2019-04-22 DIAGNOSIS — R531 Weakness: Secondary | ICD-10-CM | POA: Diagnosis not present

## 2019-04-22 DIAGNOSIS — I480 Paroxysmal atrial fibrillation: Secondary | ICD-10-CM | POA: Diagnosis not present

## 2019-04-22 DIAGNOSIS — R2681 Unsteadiness on feet: Secondary | ICD-10-CM | POA: Diagnosis not present

## 2019-04-23 ENCOUNTER — Telehealth: Payer: Self-pay | Admitting: Internal Medicine

## 2019-04-23 DIAGNOSIS — I639 Cerebral infarction, unspecified: Secondary | ICD-10-CM

## 2019-04-23 NOTE — Telephone Encounter (Signed)
Copied from [place] [id number]. Topic: Referral - Request for Referral >> Apr 23, 2019  1:55 PM Scherrie Gerlach wrote: Wife called to advise that Kent Nolan is coming to the home, and told the wife pt would benefit from PT.  Pt is in the bed all the time and cannot get up. They feel they can work with him, but need a dr order.  Kent Nolan [phone]

## 2019-04-24 ENCOUNTER — Telehealth: Payer: Self-pay | Admitting: Internal Medicine

## 2019-04-24 NOTE — Addendum Note (Signed)
Addended by: Biagio Borg on: 04/24/2019 12:58 PM   Modules accepted: Orders

## 2019-04-24 NOTE — Telephone Encounter (Signed)
Pt's spouse called in to be advised. Spouse says that she feels that pt need to have some kind of therapy/ rehab for strengthening.   She would like further assistance

## 2019-04-24 NOTE — Telephone Encounter (Signed)
Pt's wife has been informed.  

## 2019-04-24 NOTE — Telephone Encounter (Signed)
Ok this is done 

## 2019-04-26 ENCOUNTER — Other Ambulatory Visit: Payer: Self-pay | Admitting: Internal Medicine

## 2019-04-30 ENCOUNTER — Telehealth: Payer: Self-pay | Admitting: Internal Medicine

## 2019-04-30 NOTE — Telephone Encounter (Signed)
Arville Go, RN, with [facility] palliative care, would like to know if Dr. Jenny Reichmann agrees for patient to start palliative care, not hospice care.   CB [phone]

## 2019-05-01 NOTE — Telephone Encounter (Signed)
Called Kent Nolan, LVM stating that it would be ok to move forward with palliative care.

## 2019-05-23 DIAGNOSIS — I480 Paroxysmal atrial fibrillation: Secondary | ICD-10-CM | POA: Diagnosis not present

## 2019-05-23 DIAGNOSIS — R531 Weakness: Secondary | ICD-10-CM | POA: Diagnosis not present

## 2019-05-23 DIAGNOSIS — G709 Myoneural disorder, unspecified: Secondary | ICD-10-CM | POA: Diagnosis not present

## 2019-05-23 DIAGNOSIS — R2681 Unsteadiness on feet: Secondary | ICD-10-CM | POA: Diagnosis not present

## 2019-06-13 NOTE — Progress Notes (Deleted)
CARDIOLOGY OFFICE NOTE  Date:  06/13/2019    Kent Nolan Date of Birth: 29-Sep-1928 Medical Record #[id number]  PCP:  Biagio Borg, MD  Cardiologist:  [facility]  No chief complaint on file.   History of Present Illness: Kent Nolan is a 85 y.o. male who presents today for a 6 month check.  Seen for Dr. Rayann Heman. Former patient of Dr. Susa Simmonds.   He has known ischemic heart disease with prior subendocardial MI in February of 2005 with stents to the RCA and LCX. He experienced acute thrombosis of both stents in February of 2010 that was associated with cardiogenic shock and had repeat stenting to both lesions at that time with non drug eluting stents placed. Follow up cath in September of 2010 was satisfactory. He is on chronic Plavix.   Other problems include DM, HTN, GERD, ACE related cough, HLD, PAF and VT back in 2010 and on chronic amiodarone therapy, CRI and remote UTIs. He does have an underlying pacemaker in place since 2011 and has had prior cardioversion for his atrial fib back in August of 2011. While he does have an elevated CHADS score, he has been maintained on chronic Plavix and aspirin therapy in light of his stent thrombosis - he declined anticoagulation with coumadin/NOVAC in the past.   I have followed him over the years - he has had a general decline - he has been dizzy - has fallen - had extensive testing in February of 2015 that was unremarkable - felt to have some degree of orthostasis - more limited by chronic back pain. 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 I last saw him in the office in August of 2019 - we did a telehealth visit in April of 2020 - his wife provided the history for the most part. Has gotten more sedentary with the pandemic. Did not sound like they were seeing nephrology any more.   The patient {does/does not:200015} have symptoms concerning for COVID-19 infection (fever, chills, cough, or  new shortness of breath).   Comes in today. Here with   Past Medical History:  Diagnosis Date  . BRADYCARDIA 08/04/2010   s/p PPM since 2011 - generator change 12/03/13  . Chronic kidney disease    followed by nephrology  . Coronary artery disease    remote subendo MI in February 2005 with stents to RCA &LCX; s/p scute thrombosis of both stents in February 2010 with cardiogenic shock - s/p BMS to both lesions; follow up cath in September 2010 was satisfactory.  He is medically managed with aspirin/Plavix  . GERD (gastroesophageal reflux disease)   . High risk medication use    on amiodarone for PAF/VT since February 2010  . HTN (hypertension)   . Hyperlipidemia   . Obesity   . Pacemaker   . PAF (paroxysmal atrial fibrillation) ([place])    s/p cardioversion in August 2011  . Stroke ([place])   . Type II or unspecified type diabetes mellitus without mention of complication, uncontrolled     Past Surgical History:  Procedure Laterality Date  . EP IMPLANTABLE DEVICE N/A 02/25/2015   Procedure: Lead Revision/Repair;  Surgeon: Thompson Grayer, MD;  Location: [facility];  Service: Cardiovascular;  Laterality: N/A;  . IR CT HEAD LTD  10/23/2017  . IR PERCUTANEOUS ART THROMBECTOMY/INFUSION INTRACRANIAL INC DIAG ANGIO  10/23/2017  . PACEMAKER GENERATOR CHANGE  12/03/13   MDT Adapta L generator change  for premature ERI by Dr Johney Frame  . PACEMAKER INSERTION  May 2011  . PERMANENT PACEMAKER GENERATOR CHANGE N/A 12/03/2013   Procedure: PERMANENT PACEMAKER GENERATOR CHANGE;  Surgeon: [name], MD;  Location: [facility];  Service: Cardiovascular;  Laterality: N/A;  . RADIOLOGY WITH ANESTHESIA N/A 10/23/2017   Procedure: RADIOLOGY WITH ANESTHESIA;  Surgeon: [name], MD;  Location: [facility] OR;  Service: Radiology;  Laterality: N/A;     Medications: No outpatient medications have been marked as taking for the 06/18/19 encounter (Appointment) with Rosalio Macadamia, NP.     Allergies:  Allergies  Allergen Reactions  . Penicillins Other (See Comments)    Childhood Allergy Has patient had a PCN reaction causing immediate rash, facial/tongue/throat swelling, SOB or lightheadedness with hypotension: Unknown Has patient had a PCN reaction causing severe rash involving mucus membranes or skin necrosis: Unknown Has patient had a PCN reaction that required hospitalization: Unknown Has patient had a PCN reaction occurring within the last 10 years: No If all of the above answers are "NO", then may proceed with Cephalosporin use.   . Sulfonamide Derivatives Other (See Comments)    Reaction unknown  . Tramadol Other (See Comments)    hallucinations    Social History: The patient  reports that he has quit smoking. His smoking use included cigarettes. He smoked 3.00 packs per day. He has never used smokeless tobacco. He reports that he does not drink alcohol or use drugs.   Family History: The patient's ***family history includes Diabetes in his father; Heart disease in his mother; Stomach cancer in his father.   Review of Systems: Please see the history of present illness.   All other systems are reviewed and negative.   Physical Exam: VS:  There were no vitals taken for this visit. Marland Kitchen  BMI There is no height or weight on file to calculate BMI.  Wt Readings from Last 3 Encounters:  02/22/19 180 lb (81.6 kg)  12/17/18 179 lb (81.2 kg)  10/08/18 187 lb 6.4 oz (85 kg)    General: Pleasant. Well developed, well nourished and in no acute distress.   HEENT: Normal.  Neck: Supple, no JVD, carotid bruits, or masses noted.  Cardiac: ***Regular rate and rhythm. No murmurs, rubs, or gallops. No edema.  Respiratory:  Lungs are clear to auscultation bilaterally with normal work of breathing.  GI: Soft and nontender.  MS: No deformity or atrophy. Gait and ROM intact.  Skin: Warm and dry. Color is normal.  Neuro:  Strength and sensation are intact and no gross focal deficits noted.   Psych: Alert, appropriate and with normal affect.   LABORATORY DATA:  EKG:  EKG {ACTION; IS/IS OVF:[id number]} ordered today. This demonstrates ***.  Lab Results  Component Value Date   WBC 5.4 03/22/2019   HGB 12.3 (L) 03/22/2019   HCT 38.6 (L) 03/22/2019   PLT 176 03/22/2019   GLUCOSE 137 (H) 03/22/2019   CHOL 75 02/21/2019   TRIG 104.0 02/21/2019   HDL 33.10 (L) 02/21/2019   LDLDIRECT 45.4 07/23/2012   LDLCALC 21 02/21/2019   ALT 17 02/28/2019   AST 26 02/28/2019   NA 133 (L) 03/22/2019   K 3.8 03/22/2019   CL 101 03/22/2019   CREATININE 1.39 (H) 03/22/2019   BUN 16 03/22/2019   CO2 25 03/22/2019   TSH 3.07 02/21/2019   INR 1.5 (H) 02/28/2019   HGBA1C 5.8 02/21/2019   MICROALBUR 1.7 07/22/2015     BNP (last 3 results)  Recent Labs    02/22/19 2348  BNP 416.7*    ProBNP (last 3 results) Recent Labs    02/21/19 1245  PROBNP 506.0*     Other Studies Reviewed Today:  EchoStudy Conclusions2/2019  - Left ventricle: The cavity size was normal. Wall thickness was normal. Systolic function was mildly to moderately reduced. The estimated ejection fraction was in the range of 40% to 45%. Diffuse hypokinesis. There is akinesis of the inferior and inferoseptal myocardium. Doppler parameters are consistent with abnormal left ventricular relaxation (grade 1 diastolic dysfunction). - Mitral valve: There was mild regurgitation. - Tricuspid valve: There was mild regurgitation.  Impressions:  - No cardiac source of emboli was indentified.   Myoview Overall Impression from 10/2013:  Intermediate risk stress nuclear study with  large (35% of myocardium) mostly fixed inferolateral defect  suggesting of LCx territory scar. No significant reversible  ischemia.  LV Wall Motion: LVEF 47%, with inferolateral akinesis  Chrystie NoseKenneth C. Hilty, MD, [facility] CenterFACC Board Certified in Nuclear Cardiology Attending Cardiologist [facility]   ASSESSMENT & PLAN:     1.Prior stroke - he remains on aspirin/Eliquis. We have previously given the ok to stop his aspirin - he has been on both by neurology - no longer taking his high dose aspirin - just 81 mg - no bleeding noted.   2. Failure to thrive - has told me previously that he is ready to die - not really discussed today - sounds like he has gotten more sedentary with the shelter in place orders.   3. Underlying SSS -pacemaker in place - prior atrial lead revisionfrom 2016.Followed by Dr. Johney FrameAllred- last check in October noted that he had 2nd degree AV block and was pacing more. He was programmed DDD at that visit as MVP was no longer working  4. PAF -see #3. Balance remains an issue. Has stopped his high dose aspirin.   5. CAD-No ischemic symptoms reported - would favor conservative management.    6. HLD - on statintherapy   7. High risk therapy -not able at this time to obtain lab in light of COVID 19 issues. Will plan on checking lab in August.   8. LV dysfunction -last echo noted - would favor conservative management. He does not sound to have any active symptoms of heart failure.  9. Prior episode of epistaxis - resolved with cautery- has not recurred. High dose aspirin has been discontinued.   10. COVID-19 Education: The signs and symptoms of COVID-19 were discussed with the patient and how to seek care for testing (follow up with PCP or arrange E-visit).  The importance of social distancing, staying at home, hand hygiene and wearing a mask when out in public were discussed today.  Current medicines are reviewed with the patient today.  The patient does not have concerns regarding medicines other than what has been noted above.  The following changes have been made:  See above.  Labs/ tests ordered today include:   No orders of the defined types were placed in this encounter.    Disposition:   FU with *** in {gen number 1-61:[id number]}0-10:310397} {Days to years:10300}.   Patient  is agreeable to this plan and will call if any problems develop in the interim.   SignedNorma Fredrickson: Rubens Cranston, NP  06/13/2019 8:03 PM  [facility] [address] [place], [place]  [address] Phone: [phone] Fax: [phone]

## 2019-06-18 ENCOUNTER — Ambulatory Visit: Payer: PPO | Admitting: Nurse Practitioner

## 2019-06-22 DIAGNOSIS — R2681 Unsteadiness on feet: Secondary | ICD-10-CM | POA: Diagnosis not present

## 2019-06-22 DIAGNOSIS — R531 Weakness: Secondary | ICD-10-CM | POA: Diagnosis not present

## 2019-06-22 DIAGNOSIS — G709 Myoneural disorder, unspecified: Secondary | ICD-10-CM | POA: Diagnosis not present

## 2019-06-22 DIAGNOSIS — I480 Paroxysmal atrial fibrillation: Secondary | ICD-10-CM | POA: Diagnosis not present

## 2019-07-01 ENCOUNTER — Ambulatory Visit (INDEPENDENT_AMBULATORY_CARE_PROVIDER_SITE_OTHER): Admitting: Internal Medicine

## 2019-07-01 DIAGNOSIS — E119 Type 2 diabetes mellitus without complications: Secondary | ICD-10-CM | POA: Diagnosis not present

## 2019-07-01 NOTE — Progress Notes (Signed)
Patient ID: Kent Nolan, male   DOB: 1929/03/23, 85 y.o.   MRN: [id number]  Phone visit  Cumulative time during 7-day interval 12 min, there was not an associated office visit for this concern within a 7 day period.  Verbal consent for services obtained from patient prior to services given.  Names of all persons present for services: Cathlean Cower, MD, patient and wife  Chief complaint: weakness  History, background, results pertinent:  85 yo M seen with wife, Pt denies chest pain, increased sob or doe, wheezing, orthopnea, PND, increased LE swelling, palpitations, dizziness or syncope.  Pt denies new neurological symptoms such as new headache, or facial or extremity weakness or numbness   Pt denies polydipsia, polyuria,  Pt states overall good compliance with meds, trying to follow lower cholesterol, diabetic diet, wt overall stable but little exercise however.   Does c/o ongoing fatigue, but denies signficant daytime hypersomnolence. Followed by hospice  Past Medical History:  Diagnosis Date  . BRADYCARDIA 08/04/2010   s/p PPM since 2011 - generator change 12/03/13  . Chronic kidney disease    followed by nephrology  . Coronary artery disease    remote subendo MI in February 2005 with stents to RCA &LCX; s/p scute thrombosis of both stents in February 2010 with cardiogenic shock - s/p BMS to both lesions; follow up cath in September 2010 was satisfactory.  He is medically managed with aspirin/Plavix  . GERD (gastroesophageal reflux disease)   . High risk medication use    on amiodarone for PAF/VT since February 2010  . HTN (hypertension)   . Hyperlipidemia   . Obesity   . Pacemaker   . PAF (paroxysmal atrial fibrillation) ([place])    s/p cardioversion in August 2011  . Stroke ([place])   . Type II or unspecified type diabetes mellitus without mention of complication, uncontrolled    No results found for this or any previous visit (from the past 48 hour(s)).  Lab Results  Component Value  Date   WBC 5.4 03/22/2019   HGB 12.3 (L) 03/22/2019   HCT 38.6 (L) 03/22/2019   PLT 176 03/22/2019   GLUCOSE 137 (H) 03/22/2019   CHOL 75 02/21/2019   TRIG 104.0 02/21/2019   HDL 33.10 (L) 02/21/2019   LDLDIRECT 45.4 07/23/2012   LDLCALC 21 02/21/2019   ALT 17 02/28/2019   AST 26 02/28/2019   NA 133 (L) 03/22/2019   K 3.8 03/22/2019   CL 101 03/22/2019   CREATININE 1.39 (H) 03/22/2019   BUN 16 03/22/2019   CO2 25 03/22/2019   TSH 3.07 02/21/2019   INR 1.5 (H) 02/28/2019   HGBA1C 5.8 02/21/2019   MICROALBUR 1.7 07/22/2015   A/P/next steps:   Weakness/fatigue - non specific, wife and pt now decline any further evalaution, cont with hospice for symptomatic tx  DM - stable overall by history and exam, recent data reviewed with pt, and pt to continue medical treatment as before,  to f/u any worsening symptoms or concerns  HTN - to continue to monitor BP at home and with hospice  Cathlean Cower MD

## 2019-07-06 ENCOUNTER — Encounter: Payer: Self-pay | Admitting: Internal Medicine

## 2019-07-06 NOTE — Patient Instructions (Signed)
Please continue all other medications as before, and refills have been done if requested.  Please have the pharmacy call with any other refills you may need.  Please continue your efforts at being more active, low cholesterol diet, and weight control.  Please keep your appointments with your specialists as you may have planned     

## 2019-08-22 DIAGNOSIS — R2681 Unsteadiness on feet: Secondary | ICD-10-CM | POA: Diagnosis not present

## 2019-08-22 DIAGNOSIS — I480 Paroxysmal atrial fibrillation: Secondary | ICD-10-CM | POA: Diagnosis not present

## 2019-08-22 DIAGNOSIS — G709 Myoneural disorder, unspecified: Secondary | ICD-10-CM | POA: Diagnosis not present

## 2019-08-22 DIAGNOSIS — R531 Weakness: Secondary | ICD-10-CM | POA: Diagnosis not present

## 2019-08-26 ENCOUNTER — Other Ambulatory Visit: Payer: Self-pay | Admitting: Internal Medicine

## 2019-09-22 DIAGNOSIS — R2681 Unsteadiness on feet: Secondary | ICD-10-CM | POA: Diagnosis not present

## 2019-09-22 DIAGNOSIS — I480 Paroxysmal atrial fibrillation: Secondary | ICD-10-CM | POA: Diagnosis not present

## 2019-09-22 DIAGNOSIS — G709 Myoneural disorder, unspecified: Secondary | ICD-10-CM | POA: Diagnosis not present

## 2019-09-22 DIAGNOSIS — R531 Weakness: Secondary | ICD-10-CM | POA: Diagnosis not present

## 2019-10-23 DIAGNOSIS — I480 Paroxysmal atrial fibrillation: Secondary | ICD-10-CM | POA: Diagnosis not present

## 2019-10-23 DIAGNOSIS — R531 Weakness: Secondary | ICD-10-CM | POA: Diagnosis not present

## 2019-10-23 DIAGNOSIS — G709 Myoneural disorder, unspecified: Secondary | ICD-10-CM | POA: Diagnosis not present

## 2019-10-23 DIAGNOSIS — R2681 Unsteadiness on feet: Secondary | ICD-10-CM | POA: Diagnosis not present

## 2019-11-19 ENCOUNTER — Telehealth: Payer: Self-pay | Admitting: Internal Medicine

## 2019-11-19 MED ORDER — OXYBUTYNIN CHLORIDE 5 MG PO TABS: 5.0000 mg | ORAL_TABLET | Freq: Three times a day (TID) | ORAL | 1 refills | Status: DC | PRN

## 2019-11-19 MED ORDER — PHENAZOPYRIDINE HCL 200 MG PO TABS: 200.0000 mg | ORAL_TABLET | Freq: Three times a day (TID) | ORAL | 1 refills | Status: AC | PRN

## 2019-11-19 NOTE — Telephone Encounter (Signed)
pls advise if ok to rx.Marland KitchenRaechel Nolan

## 2019-11-19 NOTE — Telephone Encounter (Signed)
Done erx 

## 2019-11-19 NOTE — Telephone Encounter (Signed)
    Spouse calling to request a prescription be written for Phenazopyridine and Oxybutynin for bladder spasms. Mrs. Nutting states he is bedridden, unable to schedule office visit. She states Dr Jonny Ruiz has written order in the past for medication.  Please advise

## 2019-12-03 ENCOUNTER — Other Ambulatory Visit: Payer: Self-pay | Admitting: Internal Medicine

## 2019-12-05 IMAGING — CT CT HEAD WITHOUT CONTRAST
4 series · 17 of 47 positions shown, 19 images · non-contrast
Comparison: 02/22/2019

CLINICAL DATA: Altered mental status.

EXAM:
CT HEAD WITHOUT CONTRAST
TECHNIQUE: Contiguous axial images were obtained from the base of the skull
through the vertex without intravenous contrast.

[Series 3: head without · axial · non-contrast · 0.45mm/px · z∈[-140,-10]mm · 7 of 36 slices shown, 9 images]
[im 5/36  brain]
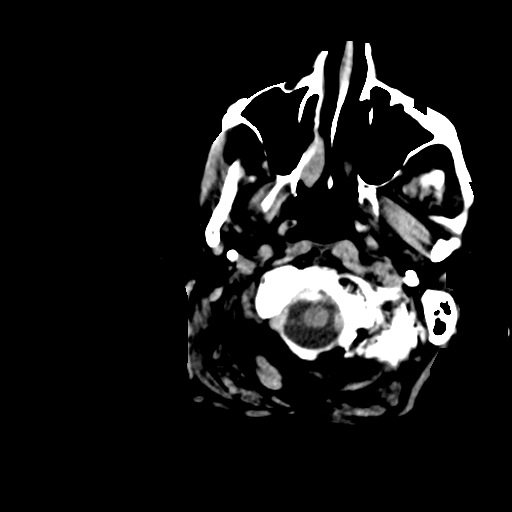
[im 5/36  bone]
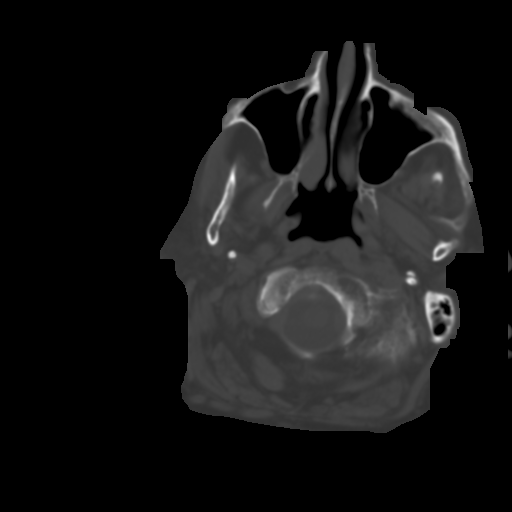
[im 9/36  brain]
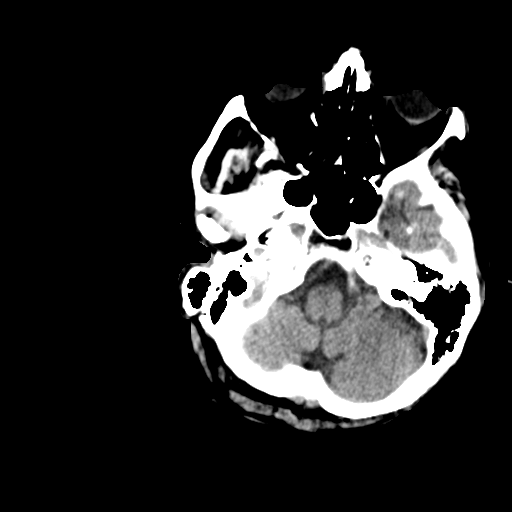
[im 14/36  brain]
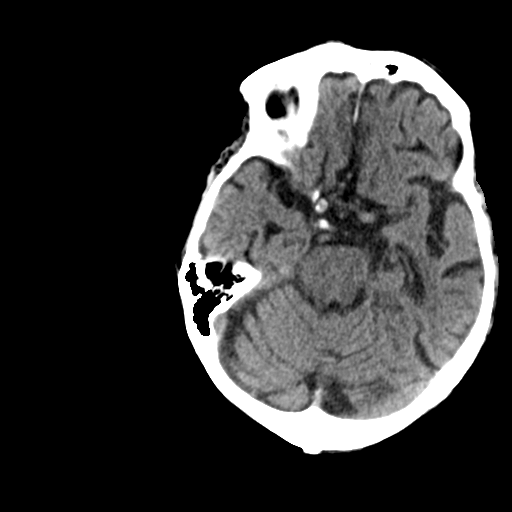
[im 18/36  brain]
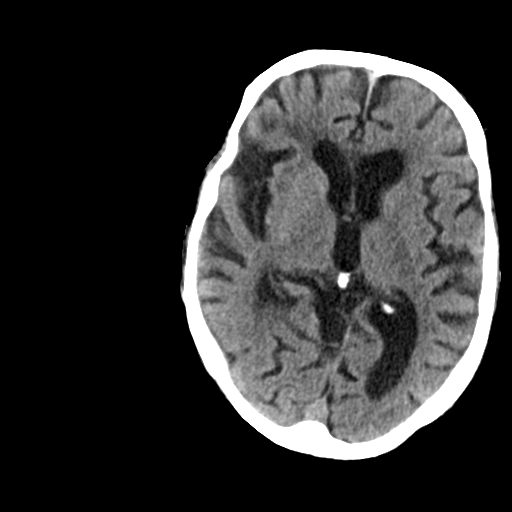
[im 22/36  brain]
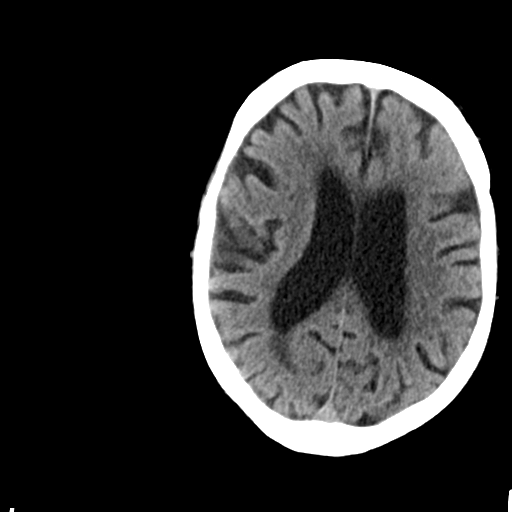
[im 22/36  bone]
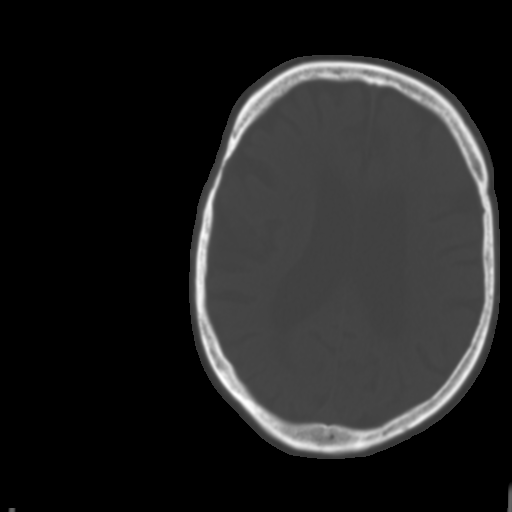
[im 27/36  brain]
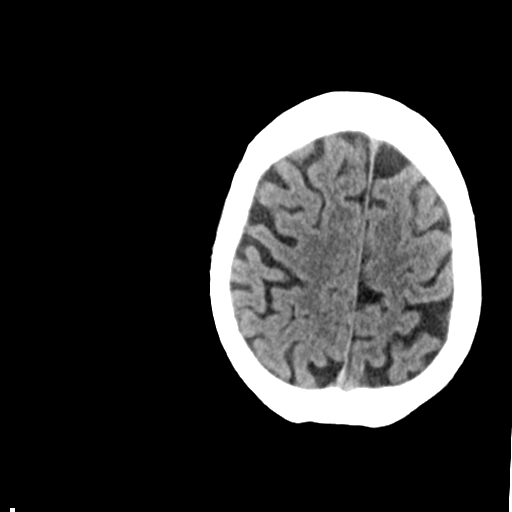
[im 31/36  brain]
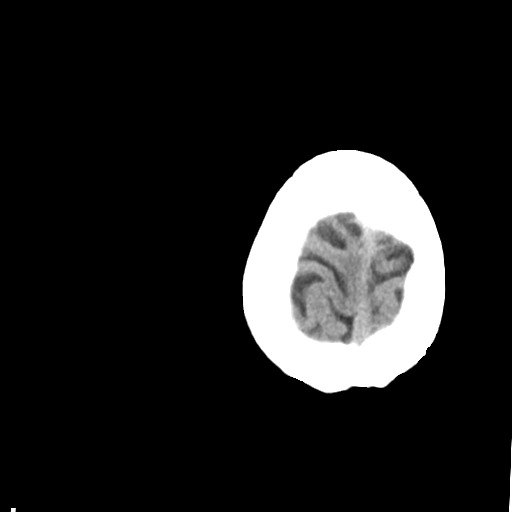

[Series 4: head bone · axial · 0.45mm/px · z∈[-144,-82]mm · 4 of 88 slices shown]
[im 9/88  bone]
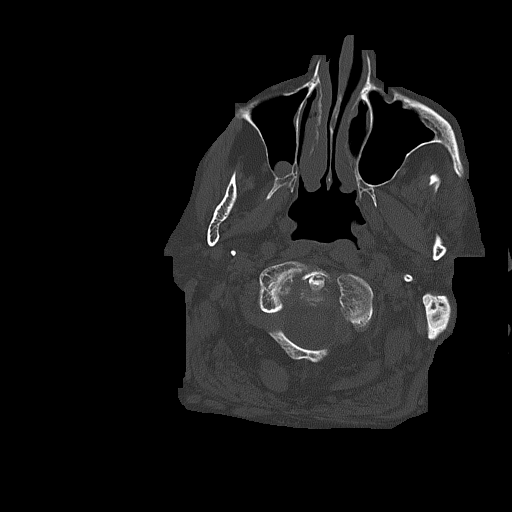
[im 18/88  bone]
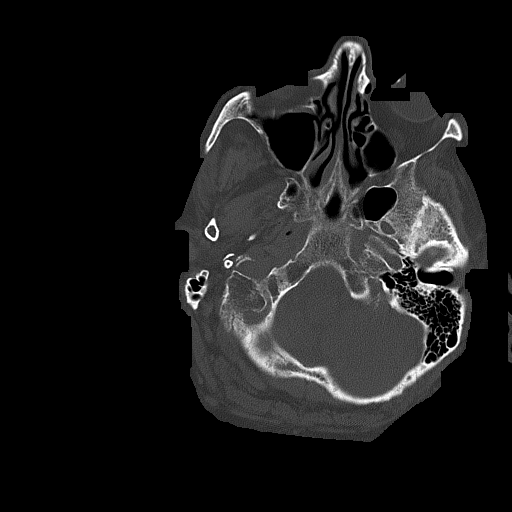
[im 27/88  bone]
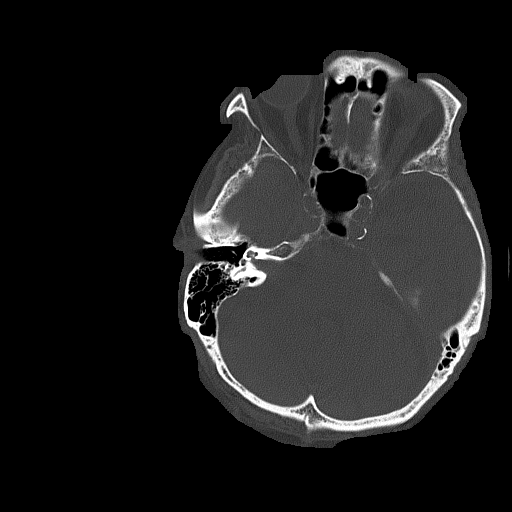
[im 40/88  bone]
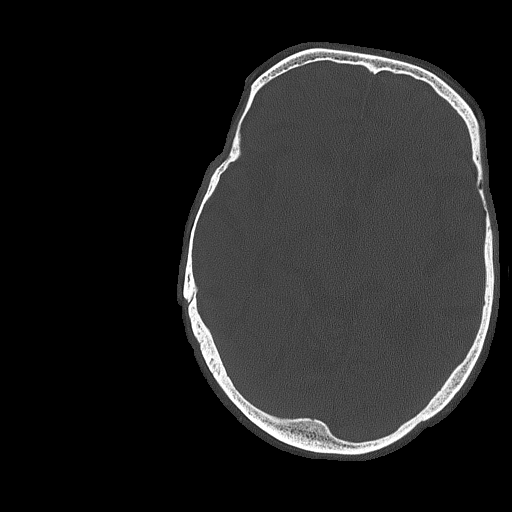

[Series 5: head without cor · coronal · non-contrast · 0.44mm/px · 3 of 75 slices shown]
[im 25/75  brain]
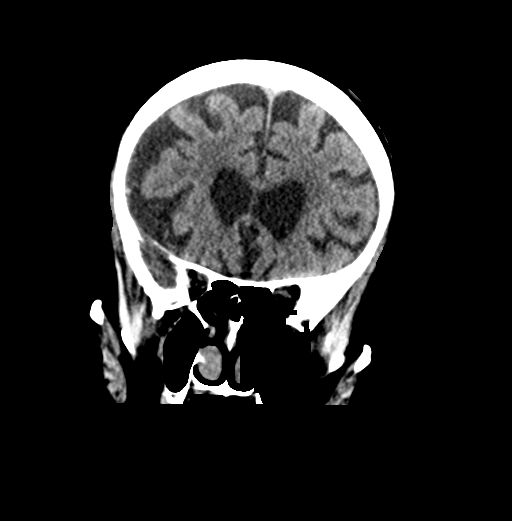
[im 33/75  brain]
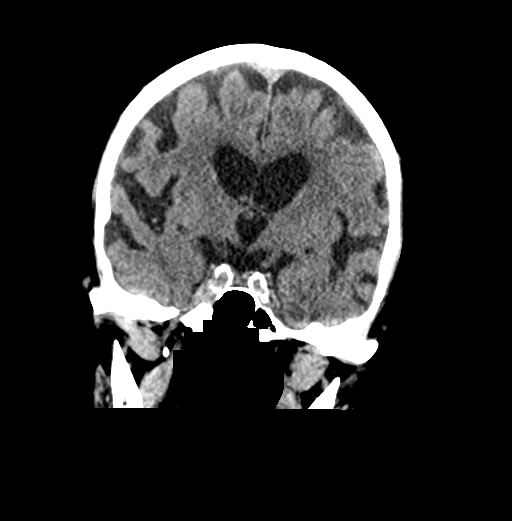
[im 42/75  brain]
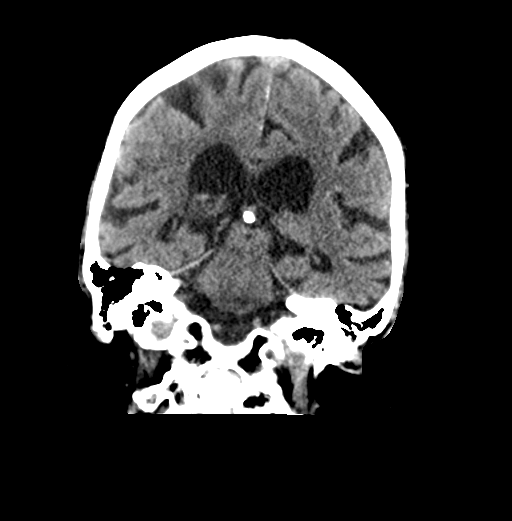

[Series 6: head without sag · sagittal · non-contrast · 0.35mm/px · 3 of 64 slices shown]
[im 22/64  brain]
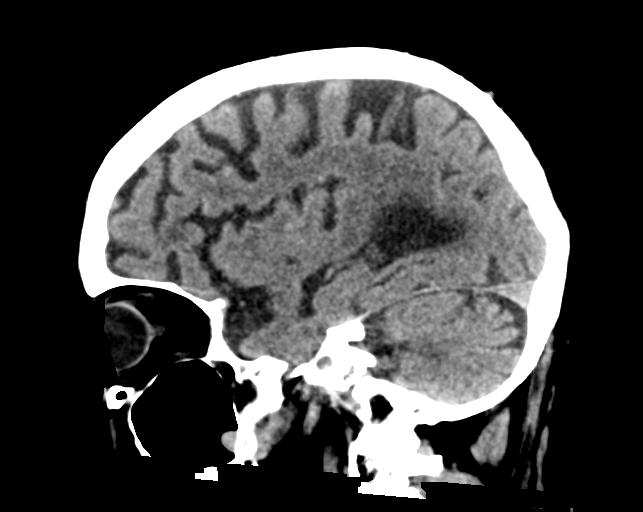
[im 32/64  brain]
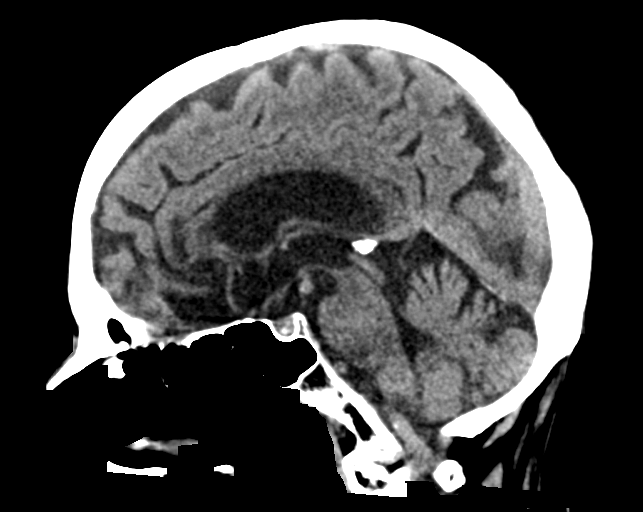
[im 43/64  brain]
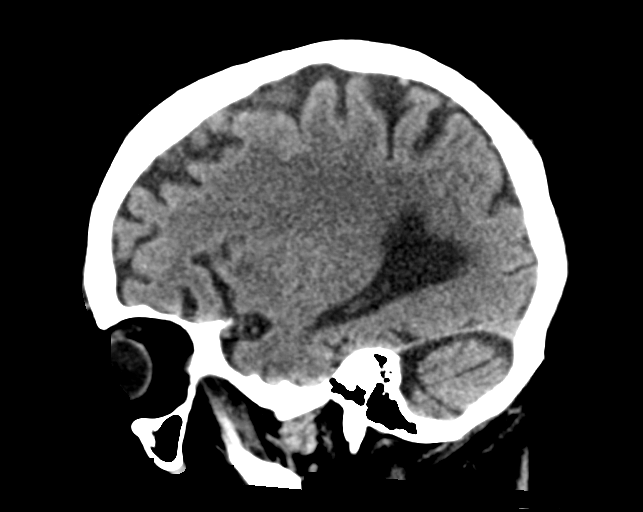

[17 of 47 positions shown; findings below may reference images not displayed]

FINDINGS: Brain: Examination demonstrates evidence of age related atrophic
change and mild chronic ischemic microvascular disease. There is no
mass, mass effect, shift of midline structures or acute hemorrhage.
No evidence of acute infarction.

Vascular: No hyperdense vessel or unexpected calcification.

Skull: Normal. Negative for fracture or focal lesion.

Sinuses/Orbits: Orbits are within normal. Paranasal sinuses are well
developed with minimal mucosal membrane thickening over the right
maxillary sinus. Mastoid air cells are clear.

Other: None.
IMPRESSION: No acute findings.

Chronic ischemic microvascular disease and age related atrophic
change.

## 2019-12-06 ENCOUNTER — Other Ambulatory Visit: Payer: Self-pay | Admitting: Nurse Practitioner

## 2019-12-27 ENCOUNTER — Other Ambulatory Visit: Payer: Self-pay | Admitting: Internal Medicine

## 2019-12-31 ENCOUNTER — Other Ambulatory Visit: Payer: Self-pay | Admitting: Internal Medicine

## 2020-01-19 ENCOUNTER — Other Ambulatory Visit: Payer: Self-pay | Admitting: Internal Medicine

## 2020-02-03 ENCOUNTER — Other Ambulatory Visit: Payer: Self-pay | Admitting: Internal Medicine

## 2020-02-04 DEATH — deceased
